# Patient Record
Sex: Male | Born: 1940 | Race: Black or African American | Hispanic: No | Marital: Married | Smoking: Former smoker
Health system: Southern US, Community
[De-identification: ages and names within clinical notes are randomized; demographics above are authoritative.]

## PROBLEM LIST (undated history)

## (undated) DIAGNOSIS — T7840XA Allergy, unspecified, initial encounter: Secondary | ICD-10-CM

## (undated) DIAGNOSIS — J189 Pneumonia, unspecified organism: Secondary | ICD-10-CM

## (undated) DIAGNOSIS — K219 Gastro-esophageal reflux disease without esophagitis: Secondary | ICD-10-CM

## (undated) DIAGNOSIS — M1990 Unspecified osteoarthritis, unspecified site: Secondary | ICD-10-CM

## (undated) DIAGNOSIS — K602 Anal fissure, unspecified: Secondary | ICD-10-CM

## (undated) DIAGNOSIS — G4733 Obstructive sleep apnea (adult) (pediatric): Secondary | ICD-10-CM

## (undated) DIAGNOSIS — I10 Essential (primary) hypertension: Secondary | ICD-10-CM

## (undated) DIAGNOSIS — H9190 Unspecified hearing loss, unspecified ear: Secondary | ICD-10-CM

## (undated) DIAGNOSIS — N529 Male erectile dysfunction, unspecified: Secondary | ICD-10-CM

## (undated) DIAGNOSIS — G4730 Sleep apnea, unspecified: Secondary | ICD-10-CM

## (undated) HISTORY — PX: OTHER SURGICAL HISTORY: SHX169

## (undated) HISTORY — DX: Unspecified hearing loss, unspecified ear: H91.90

## (undated) HISTORY — DX: Male erectile dysfunction, unspecified: N52.9

## (undated) HISTORY — DX: Sleep apnea, unspecified: G47.30

## (undated) HISTORY — DX: Allergy, unspecified, initial encounter: T78.40XA

## (undated) HISTORY — DX: Essential (primary) hypertension: I10

## (undated) HISTORY — DX: Unspecified osteoarthritis, unspecified site: M19.90

## (undated) HISTORY — DX: Obstructive sleep apnea (adult) (pediatric): G47.33

## (undated) HISTORY — PX: ANAL FISSURE REPAIR: SHX2312

## (undated) HISTORY — DX: Gastro-esophageal reflux disease without esophagitis: K21.9

## (undated) HISTORY — DX: Anal fissure, unspecified: K60.2

## (undated) HISTORY — PX: KNEE ARTHROSCOPY: SHX127

---

## 1998-05-05 ENCOUNTER — Encounter: Payer: Self-pay | Admitting: Emergency Medicine

## 1998-05-05 ENCOUNTER — Emergency Department (HOSPITAL_COMMUNITY): Admission: EM | Admit: 1998-05-05 | Discharge: 1998-05-05 | Payer: Self-pay | Admitting: Emergency Medicine

## 2000-09-04 ENCOUNTER — Ambulatory Visit (HOSPITAL_COMMUNITY): Admission: RE | Admit: 2000-09-04 | Discharge: 2000-09-04 | Payer: Self-pay | Admitting: Orthopedic Surgery

## 2000-09-04 ENCOUNTER — Encounter: Payer: Self-pay | Admitting: Orthopedic Surgery

## 2001-05-14 ENCOUNTER — Encounter: Admission: RE | Admit: 2001-05-14 | Discharge: 2001-05-14 | Payer: Self-pay | Admitting: Internal Medicine

## 2001-05-14 ENCOUNTER — Encounter: Payer: Self-pay | Admitting: Internal Medicine

## 2003-09-24 ENCOUNTER — Ambulatory Visit (HOSPITAL_BASED_OUTPATIENT_CLINIC_OR_DEPARTMENT_OTHER): Admission: RE | Admit: 2003-09-24 | Discharge: 2003-09-24 | Payer: Self-pay | Admitting: Pulmonary Disease

## 2003-12-31 ENCOUNTER — Ambulatory Visit (HOSPITAL_BASED_OUTPATIENT_CLINIC_OR_DEPARTMENT_OTHER): Admission: RE | Admit: 2003-12-31 | Discharge: 2003-12-31 | Payer: Self-pay | Admitting: Pulmonary Disease

## 2003-12-31 ENCOUNTER — Ambulatory Visit: Payer: Self-pay | Admitting: Pulmonary Disease

## 2004-05-04 ENCOUNTER — Ambulatory Visit: Payer: Self-pay | Admitting: Family Medicine

## 2004-05-17 ENCOUNTER — Ambulatory Visit: Payer: Self-pay | Admitting: Family Medicine

## 2004-06-07 ENCOUNTER — Ambulatory Visit: Payer: Self-pay | Admitting: Gastroenterology

## 2004-06-16 ENCOUNTER — Ambulatory Visit: Payer: Self-pay | Admitting: Gastroenterology

## 2004-12-19 ENCOUNTER — Ambulatory Visit: Payer: Self-pay | Admitting: Family Medicine

## 2005-03-09 ENCOUNTER — Ambulatory Visit: Payer: Self-pay | Admitting: Family Medicine

## 2006-06-25 ENCOUNTER — Ambulatory Visit: Payer: Self-pay | Admitting: Family Medicine

## 2006-06-25 LAB — CONVERTED CEMR LAB
ALT: 17 units/L (ref 0–40)
AST: 22 units/L (ref 0–37)
Albumin: 3.4 g/dL — ABNORMAL LOW (ref 3.5–5.2)
Alkaline Phosphatase: 77 units/L (ref 39–117)
BUN: 12 mg/dL (ref 6–23)
Basophils Absolute: 0 10*3/uL (ref 0.0–0.1)
Basophils Relative: 0.1 % (ref 0.0–1.0)
Bilirubin, Direct: 0.2 mg/dL (ref 0.0–0.3)
CO2: 28 meq/L (ref 19–32)
Calcium: 9.2 mg/dL (ref 8.4–10.5)
Chloride: 109 meq/L (ref 96–112)
Cholesterol: 152 mg/dL (ref 0–200)
Creatinine, Ser: 0.9 mg/dL (ref 0.4–1.5)
Eosinophils Absolute: 0.2 10*3/uL (ref 0.0–0.6)
Eosinophils Relative: 3.2 % (ref 0.0–5.0)
GFR calc Af Amer: 109 mL/min
GFR calc non Af Amer: 90 mL/min
Glucose, Bld: 121 mg/dL — ABNORMAL HIGH (ref 70–99)
HCT: 40.8 % (ref 39.0–52.0)
HDL: 41.7 mg/dL (ref >39.0)
Hemoglobin: 13.8 g/dL (ref 13.0–17.0)
Hgb A1c MFr Bld: 6.5 % — ABNORMAL HIGH (ref 4.6–6.0)
LDL Cholesterol: 95 mg/dL (ref 0–99)
Lymphocytes Relative: 32.9 % (ref 12.0–46.0)
MCHC: 33.8 g/dL (ref 30.0–36.0)
MCV: 88.1 fL (ref 78.0–100.0)
Monocytes Absolute: 0.3 10*3/uL (ref 0.2–0.7)
Monocytes Relative: 5.4 % (ref 3.0–11.0)
Neutro Abs: 3.1 10*3/uL (ref 1.4–7.7)
Neutrophils Relative %: 58.4 % (ref 43.0–77.0)
PSA: 0.98 ng/mL (ref 0.10–4.00)
Platelets: 176 10*3/uL (ref 150–400)
Potassium: 3.5 meq/L (ref 3.5–5.1)
RBC: 4.63 M/uL (ref 4.22–5.81)
RDW: 13.2 % (ref 11.5–14.6)
Sodium: 143 meq/L (ref 135–145)
TSH: 1.95 microintl units/mL (ref 0.35–5.50)
Total Bilirubin: 0.9 mg/dL (ref 0.3–1.2)
Total CHOL/HDL Ratio: 3.6
Total Protein: 6.4 g/dL (ref 6.0–8.3)
Triglycerides: 78 mg/dL (ref 0–149)
VLDL: 16 mg/dL (ref 0–40)
WBC: 5.3 10*3/uL (ref 4.5–10.5)

## 2006-10-22 DIAGNOSIS — I10 Essential (primary) hypertension: Secondary | ICD-10-CM

## 2006-10-22 DIAGNOSIS — K219 Gastro-esophageal reflux disease without esophagitis: Secondary | ICD-10-CM | POA: Insufficient documentation

## 2006-10-22 HISTORY — DX: Essential (primary) hypertension: I10

## 2007-01-01 ENCOUNTER — Ambulatory Visit: Payer: Self-pay | Admitting: Family Medicine

## 2007-01-06 ENCOUNTER — Telehealth: Payer: Self-pay | Admitting: Family Medicine

## 2007-01-06 LAB — CONVERTED CEMR LAB: Hgb A1c MFr Bld: 6.4 % — ABNORMAL HIGH (ref 4.6–6.0)

## 2007-05-28 ENCOUNTER — Ambulatory Visit: Payer: Self-pay | Admitting: Family Medicine

## 2007-06-02 ENCOUNTER — Encounter: Payer: Self-pay | Admitting: Family Medicine

## 2007-06-05 ENCOUNTER — Encounter: Admission: RE | Admit: 2007-06-05 | Discharge: 2007-06-05 | Payer: Self-pay | Admitting: Family Medicine

## 2007-06-16 ENCOUNTER — Ambulatory Visit: Payer: Self-pay | Admitting: Family Medicine

## 2007-06-17 LAB — CONVERTED CEMR LAB
CO2: 30 meq/L (ref 19–32)
Calcium: 9.1 mg/dL (ref 8.4–10.5)
Chloride: 107 meq/L (ref 96–112)
Creatinine, Ser: 1.1 mg/dL (ref 0.4–1.5)
Glucose, Bld: 96 mg/dL (ref 70–99)
Sodium: 141 meq/L (ref 135–145)

## 2007-06-18 ENCOUNTER — Ambulatory Visit: Payer: Self-pay | Admitting: Cardiology

## 2007-06-18 ENCOUNTER — Encounter: Payer: Self-pay | Admitting: Family Medicine

## 2007-06-26 ENCOUNTER — Ambulatory Visit: Payer: Self-pay | Admitting: Family Medicine

## 2008-08-16 ENCOUNTER — Telehealth: Payer: Self-pay | Admitting: Family Medicine

## 2008-11-19 ENCOUNTER — Ambulatory Visit: Payer: Self-pay | Admitting: Family Medicine

## 2008-12-17 ENCOUNTER — Emergency Department (HOSPITAL_COMMUNITY): Admission: EM | Admit: 2008-12-17 | Discharge: 2008-12-17 | Payer: Self-pay | Admitting: Emergency Medicine

## 2009-02-11 ENCOUNTER — Ambulatory Visit: Payer: Self-pay | Admitting: Family Medicine

## 2009-11-21 ENCOUNTER — Ambulatory Visit: Payer: Self-pay | Admitting: Family Medicine

## 2009-11-21 ENCOUNTER — Encounter: Payer: Self-pay | Admitting: Family Medicine

## 2009-11-21 LAB — CONVERTED CEMR LAB
Bilirubin Urine: NEGATIVE
Ketones, urine, test strip: NEGATIVE
Nitrite: NEGATIVE
Protein, U semiquant: NEGATIVE
Urobilinogen, UA: 0.2

## 2009-11-23 LAB — CONVERTED CEMR LAB
ALT: 20 units/L (ref 0–53)
BUN: 13 mg/dL (ref 6–23)
Bilirubin, Direct: 0.2 mg/dL (ref 0.0–0.3)
Calcium: 9.3 mg/dL (ref 8.4–10.5)
Chloride: 103 meq/L (ref 96–112)
Cholesterol: 149 mg/dL (ref 0–200)
Creatinine, Ser: 0.9 mg/dL (ref 0.4–1.5)
Eosinophils Relative: 2.9 % (ref 0.0–5.0)
GFR calc non Af Amer: 113.27 mL/min (ref >60)
HDL: 39 mg/dL — ABNORMAL LOW (ref >39.00)
LDL Cholesterol: 86 mg/dL (ref 0–99)
Lymphocytes Relative: 30.1 % (ref 12.0–46.0)
MCV: 88.3 fL (ref 78.0–100.0)
Monocytes Absolute: 0.4 10*3/uL (ref 0.1–1.0)
Neutrophils Relative %: 61 % (ref 43.0–77.0)
PSA: 0.92 ng/mL (ref 0.10–4.00)
Platelets: 171 10*3/uL (ref 150.0–400.0)
Total Bilirubin: 1.1 mg/dL (ref 0.3–1.2)
Triglycerides: 118 mg/dL (ref 0.0–149.0)
VLDL: 23.6 mg/dL (ref 0.0–40.0)
WBC: 7.8 10*3/uL (ref 4.5–10.5)

## 2009-11-25 ENCOUNTER — Encounter (INDEPENDENT_AMBULATORY_CARE_PROVIDER_SITE_OTHER): Payer: Self-pay | Admitting: *Deleted

## 2009-12-05 ENCOUNTER — Encounter (INDEPENDENT_AMBULATORY_CARE_PROVIDER_SITE_OTHER): Payer: Self-pay | Admitting: *Deleted

## 2009-12-07 ENCOUNTER — Ambulatory Visit: Payer: Self-pay | Admitting: Gastroenterology

## 2009-12-26 ENCOUNTER — Encounter: Payer: Self-pay | Admitting: Gastroenterology

## 2010-01-17 ENCOUNTER — Ambulatory Visit: Payer: Self-pay | Admitting: Family Medicine

## 2010-01-17 DIAGNOSIS — M1990 Unspecified osteoarthritis, unspecified site: Secondary | ICD-10-CM

## 2010-01-24 ENCOUNTER — Encounter: Payer: Self-pay | Admitting: Family Medicine

## 2010-03-26 LAB — CONVERTED CEMR LAB
ALT: 21 units/L (ref 0–53)
ALT: 24 units/L (ref 0–53)
AST: 25 units/L (ref 0–37)
AST: 25 units/L (ref 0–37)
Alkaline Phosphatase: 76 units/L (ref 39–117)
Alkaline Phosphatase: 91 units/L (ref 39–117)
Basophils Absolute: 0 10*3/uL (ref 0.0–0.1)
Basophils Absolute: 0 10*3/uL (ref 0.0–0.1)
Bilirubin, Direct: 0.1 mg/dL (ref 0.0–0.3)
CO2: 32 meq/L (ref 19–32)
Calcium: 9.1 mg/dL (ref 8.4–10.5)
Chloride: 106 meq/L (ref 96–112)
Eosinophils Relative: 3.3 % (ref 0.0–5.0)
GFR calc non Af Amer: 107.8 mL/min (ref >60)
Glucose, Bld: 115 mg/dL — ABNORMAL HIGH (ref 70–99)
Glucose, Bld: 117 mg/dL — ABNORMAL HIGH (ref 70–99)
HCT: 40.7 % (ref 39.0–52.0)
HDL: 34.2 mg/dL — ABNORMAL LOW (ref >39.00)
Hemoglobin: 13.3 g/dL (ref 13.0–17.0)
Hemoglobin: 13.6 g/dL (ref 13.0–17.0)
LDL Cholesterol: 84 mg/dL (ref 0–99)
LDL Cholesterol: 86 mg/dL (ref 0–99)
Lymphocytes Relative: 25.2 % (ref 12.0–46.0)
Lymphocytes Relative: 30.1 % (ref 12.0–46.0)
MCHC: 32.8 g/dL (ref 30.0–36.0)
Monocytes Relative: 5.3 % (ref 3.0–12.0)
Monocytes Relative: 5.5 % (ref 3.0–12.0)
Neutro Abs: 4 10*3/uL (ref 1.4–7.7)
Neutro Abs: 4.3 10*3/uL (ref 1.4–7.7)
Neutrophils Relative %: 66.2 % (ref 43.0–77.0)
Platelets: 174 10*3/uL (ref 150.0–400.0)
Potassium: 3.6 meq/L (ref 3.5–5.1)
Potassium: 3.7 meq/L (ref 3.5–5.1)
RDW: 13 % (ref 11.5–14.6)
RDW: 13.1 % (ref 11.5–14.6)
Sodium: 141 meq/L (ref 135–145)
Sodium: 142 meq/L (ref 135–145)
TSH: 2.06 microintl units/mL (ref 0.35–5.50)
Total Bilirubin: 1 mg/dL (ref 0.3–1.2)
Total Bilirubin: 1.1 mg/dL (ref 0.3–1.2)
Total CHOL/HDL Ratio: 3.5
Total Protein: 6.7 g/dL (ref 6.0–8.3)
Triglycerides: 74 mg/dL (ref 0–149)
VLDL: 13.6 mg/dL (ref 0.0–40.0)
VLDL: 15 mg/dL (ref 0–40)
WBC: 6.4 10*3/uL (ref 4.5–10.5)

## 2010-03-30 NOTE — Assessment & Plan Note (Signed)
Summary: CPX (PT WILL COME IN FASTING) // RS   Vital Signs:  Patient profile:   70 year old male Height:      67 inches Weight:      219 pounds BMI:     34.42 O2 Sat:      96 % Temp:     97.7 degrees F Pulse rate:   60 / minute BP sitting:   130 / 86  (left arm) Cuff size:   large  Vitals Entered By: Pura Spice, RN (November 21, 2009 9:57 AM) CC: cpx,c/o arthritis    History of Present Illness: 70 yr old male for a cpx. He feels well in general.   Allergies (verified): No Known Drug Allergies  Past History:  Past Medical History: Reviewed history from 06/26/2007 and no changes required. Hypertension ED GERD Hematuria, hx of, had a normal workup per Dr. Annabell Howells 2006  Past Surgical History: Reviewed history from 06/26/2007 and no changes required. Colonoscopy-06/16/2004 per Dr. Jarold Motto, repeat in 5 years arthroscopy left knee  Family History: Reviewed history from 10/22/2006 and no changes required. Family History Diabetes 1st degree relative Family History Hypertension  Social History: Reviewed history from 10/22/2006 and no changes required. Married Never Smoked Alcohol use-no Drug use-no Regular exercise-no  Review of Systems  The patient denies anorexia, fever, weight loss, weight gain, vision loss, decreased hearing, hoarseness, chest pain, syncope, dyspnea on exertion, peripheral edema, prolonged cough, headaches, hemoptysis, abdominal pain, melena, hematochezia, severe indigestion/heartburn, hematuria, incontinence, genital sores, muscle weakness, suspicious skin lesions, transient blindness, difficulty walking, depression, unusual weight change, abnormal bleeding, enlarged lymph nodes, angioedema, breast masses, and testicular masses.         Flu Vaccine Consent Questions     Do you have a history of severe allergic reactions to this vaccine? no    Any prior history of allergic reactions to egg and/or gelatin? no    Do you have a sensitivity to the  preservative Thimersol? no    Do you have a past history of Guillan-Barre Syndrome? no    Do you currently have an acute febrile illness? no    Have you ever had a severe reaction to latex? no    Vaccine information given and explained to patient? yes    Are you currently pregnant? no    Lot Number:[id number]   Exp Date:08/26/2010   Site Given  Left Deltoid IM Pura Spice, RN  November 21, 2009 4:50 PM   Physical Exam  General:  Well-developed,well-nourished,in no acute distress; alert,appropriate and cooperative throughout examination Head:  Normocephalic and atraumatic without obvious abnormalities. No apparent alopecia or balding. Eyes:  No corneal or conjunctival inflammation noted. EOMI. Perrla. Funduscopic exam benign, without hemorrhages, exudates or papilledema. Vision grossly normal. Ears:  External ear exam shows no significant lesions or deformities.  Otoscopic examination reveals clear canals, tympanic membranes are intact bilaterally without bulging, retraction, inflammation or discharge. Hearing is grossly normal bilaterally. Nose:  External nasal examination shows no deformity or inflammation. Nasal mucosa are pink and moist without lesions or exudates. Mouth:  Oral mucosa and oropharynx without lesions or exudates.  Teeth in good repair. Neck:  No deformities, masses, or tenderness noted. Chest Wall:  No deformities, masses, tenderness or gynecomastia noted. Lungs:  Normal respiratory effort, chest expands symmetrically. Lungs are clear to auscultation, no crackles or wheezes. Heart:  Normal rate and regular rhythm. S1 and S2 normal without gallop, murmur, click, rub or other extra sounds. EKG normal Abdomen:  Bowel sounds positive,abdomen soft and non-tender without masses, organomegaly or hernias noted. Rectal:  No external abnormalities noted. Normal sphincter tone. No rectal masses or tenderness. Heme neg. Genitalia:  Testes bilaterally descended without nodularity,  tenderness or masses. No scrotal masses or lesions. No penis lesions or urethral discharge. Prostate:  Prostate gland firm and smooth, no enlargement, nodularity, tenderness, mass, asymmetry or induration. Msk:  No deformity or scoliosis noted of thoracic or lumbar spine.   Pulses:  R and L carotid,radial,femoral,dorsalis pedis and posterior tibial pulses are full and equal bilaterally Extremities:  No clubbing, cyanosis, edema, or deformity noted with normal full range of motion of all joints.   Neurologic:  No cranial nerve deficits noted. Station and gait are normal. Plantar reflexes are down-going bilaterally. DTRs are symmetrical throughout. Sensory, motor and coordinative functions appear intact. Skin:  Intact without suspicious lesions or rashes Cervical Nodes:  No lymphadenopathy noted Axillary Nodes:  No palpable lymphadenopathy Inguinal Nodes:  No significant adenopathy Psych:  Cognition and judgment appear intact. Alert and cooperative with normal attention span and concentration. No apparent delusions, illusions, hallucinations   Impression & Recommendations:  Problem # 1:  WELL ADULT EXAM (ICD-V70.0)  Orders: Hemoccult Guaiac-1 spec.(in office) (82270) UA Dipstick w/o Micro (automated)  (81003) EKG w/ Interpretation (93000) Venipuncture (09811) TLB-Lipid Panel (80061-LIPID) TLB-BMP (Basic Metabolic Panel-BMET) (80048-METABOL) TLB-CBC Platelet - w/Differential (85025-CBCD) TLB-Hepatic/Liver Function Pnl (80076-HEPATIC) TLB-TSH (Thyroid Stimulating Hormone) (84443-TSH) TLB-PSA (Prostate Specific Antigen) (84153-PSA) Specimen Handling (91478) Gastroenterology Referral (GI)  Complete Medication List: 1)  Atenolol 50 Mg Tabs (Atenolol) .Marland Kitchen.. 1 by mouth once daily 2)  Hyzaar 100-25 Mg Tabs (Losartan potassium-hctz) .Marland Kitchen.. 1 by mouth once daily 3)  Cialis 20 Mg Tabs (Tadalafil) .... Use as directed 4)  Omeprazole 40 Mg Cpdr (Omeprazole) .... Two times a day 5)  Tramadol Hcl 50  Mg Tabs (Tramadol hcl) .Marland Kitchen.. 1 q 6 hours as needed pain  Other Orders: Flu Vaccine 14yrs + MEDICARE PATIENTS (G9562) Administration Flu vaccine - MCR (Z3086)  Patient Instructions: 1)  Get fasting labs Prescriptions: CIALIS 20 MG  TABS (TADALAFIL) use as directed  #6 x 11   Entered and Authorized by:   Nelwyn Salisbury MD   Signed by:   Nelwyn Salisbury MD on 11/21/2009   Method used:   [facility]  [address] [phone]       [address]       [place], [place]  [address]       Ph: [phone] or [phone]       Fax: [phone]   RxID:   [id number] HYZAAR 100-25 MG TABS (LOSARTAN POTASSIUM-HCTZ) 1 by mouth once daily  #30 x 11   Entered and Authorized by:   Nelwyn Salisbury MD   Signed by:   Nelwyn Salisbury MD on 11/21/2009   Method used:   [facility]        [facility] [facility] [address] [phone]* (retail)       [address]       [name], [place]  [address]       Ph: [phone] or [phone]       Fax: [phone]   RxID:   [id number] ATENOLOL 50 MG TABS (ATENOLOL) 1 by mouth once daily  #30 x 11   Entered and Authorized by:  Nelwyn Salisbury MD   Signed by:   Nelwyn Salisbury MD on 11/21/2009   Method used:   [facility]        [facility] [address] [facility] [address] [phone]* (retail)       [address]       [place], [place]  [address]       Ph: [phone] or [phone]       Fax: [phone]   RxID:   [id number]     Laboratory Results   Urine Tests    Routine Urinalysis   Color: yellow Appearance: Clear Glucose: negative   (Normal Range: Negative) Bilirubin: negative   (Normal Range: Negative) Ketone: negative   (Normal Range: Negative) Spec. Gravity: 1.020   (Normal Range: 1.003-1.035) Blood: 1+   (Normal Range: Negative) pH: 5.5   (Normal Range: 5.0-8.0) Protein: negative   (Normal Range: Negative) Urobilinogen: 0.2   (Normal Range:  0-1) Nitrite: negative   (Normal Range: Negative) Leukocyte Esterace: negative   (Normal Range: Negative)    Comments: Rita Ohara  November 21, 2009 11:49 AM

## 2010-03-30 NOTE — Letter (Signed)
Summary: Pre Visit Letter Revised  [facility]  [address] [place], [place] [address]   Phone: [phone]  Fax: [phone]        11/25/2009 MRN: [id number] Eric Richmond [address] [place], [place]  [address]             Procedure Date:  12-21-09   Welcome to the Gastroenterology Division at [facility].    You are scheduled to see a nurse for your pre-procedure visit on 12-07-09 at 4:30p.m. on the 3rd floor at [facility], [address]  We ask that you try to arrive at our office 15 minutes prior to your appointment time to allow for check-in.  Please take a minute to review the attached form.  If you answer "Yes" to one or more of the questions on the first page, we ask that you call the person listed at your earliest opportunity.  If you answer "No" to all of the questions, please complete the rest of the form and bring it to your appointment.    Your nurse visit will consist of discussing your medical and surgical history, your immediate family medical history, and your medications.   If you are unable to list all of your medications on the form, please bring the medication bottles to your appointment and we will list them.  We will need to be aware of both prescribed and over the counter drugs.  We will need to know exact dosage information as well.    Please be prepared to read and sign documents such as consent forms, a financial agreement, and acknowledgement forms.  If necessary, and with your consent, a friend or relative is welcome to sit-in on the nurse visit with you.  Please bring your insurance card so that we may make a copy of it.  If your insurance requires a referral to see a specialist, please bring your referral form from your primary care physician.  No co-pay is required for this nurse visit.     If you cannot keep your appointment, please call [phone] to cancel or reschedule prior to your appointment date.  This  allows [place] the opportunity to schedule an appointment for another patient in need of care.    Thank you for choosing [facility] for your medical needs.  We appreciate the opportunity to care for you.  Please visit [place] at our website  to learn more about our practice.  Sincerely, The Gastroenterology Division

## 2010-03-30 NOTE — Assessment & Plan Note (Signed)
Summary: consult re: recurring pain in shouler/hurts to raise arm/cjr/...   Vital Signs:  Patient profile:   70 year old male Weight:      219 pounds O2 Sat:      97 % Temp:     97.7 degrees F Pulse rate:   61 / minute BP sitting:   140 / 80  (left arm) Cuff size:   large  Vitals Entered By: Pura Spice, RN (January 17, 2010 3:41 PM) CC: pain rt elbow shoulder cant raise and c/o rt knee pain   History of Present Illness: Here for pain in the right shoulder and right knee, which have been worse in the past few months. tramadol helps somewhat. He often has trouble raising his arm above his head.   Allergies: No Known Drug Allergies  Past History:  Past Medical History: Hypertension ED GERD Hematuria, hx of, had a normal workup per Dr. Annabell Howells 2006 Osteoarthritis  Review of Systems  The patient denies anorexia, fever, weight loss, weight gain, vision loss, decreased hearing, hoarseness, chest pain, syncope, dyspnea on exertion, peripheral edema, prolonged cough, headaches, hemoptysis, abdominal pain, melena, hematochezia, severe indigestion/heartburn, hematuria, incontinence, genital sores, muscle weakness, suspicious skin lesions, transient blindness, difficulty walking, depression, unusual weight change, abnormal bleeding, enlarged lymph nodes, angioedema, breast masses, and testicular masses.         Flu Vaccine Consent Questions     Do you have a history of severe allergic reactions to this vaccine? no    Any prior history of allergic reactions to egg and/or gelatin? no    Do you have a sensitivity to the preservative Thimersol? no    Do you have a past history of Guillan-Barre Syndrome? no    Do you currently have an acute febrile illness? no    Have you ever had a severe reaction to latex? no    Vaccine information given and explained to patient? yes    Are you currently pregnant? no    Lot Number:[id number]   Exp Date:08/26/2010   Site Given  Left Deltoid IM Pura Spice, RN  January 17, 2010 3:41 PM   Physical Exam  General:  walks with a slight limp Msk:  the right shoulder has a lot of crepitus, it is tender in ther anterior subacromial area. His entire ROM is very limited. The right knee is also full of crepitus, and it is tender. ROM is full   Impression & Recommendations:  Problem # 1:  OSTEOARTHRITIS (ICD-715.90)  His updated medication list for this problem includes:    Tramadol Hcl 50 Mg Tabs (Tramadol hcl) .Marland Kitchen... 1 q 6 hours as needed pain  Orders: Orthopedic Referral (Ortho)  Complete Medication List: 1)  Atenolol 50 Mg Tabs (Atenolol) .Marland Kitchen.. 1 by mouth once daily 2)  Hyzaar 100-25 Mg Tabs (Losartan potassium-hctz) .Marland Kitchen.. 1 by mouth once daily 3)  Cialis 20 Mg Tabs (Tadalafil) .... Use as directed 4)  Omeprazole 40 Mg Cpdr (Omeprazole) .... Two times a day 5)  Tramadol Hcl 50 Mg Tabs (Tramadol hcl) .Marland Kitchen.. 1 q 6 hours as needed pain  Other Orders: Admin 1st Vaccine (19147) Flu Vaccine 60yrs + (82956)  Patient Instructions: 1)  Use Tramadol as needed . he has never seen an Orthopedic doctor, so we will arrange this. He could benefit from steroid injections, among other things.    Orders Added: 1)  Admin 1st Vaccine [90471] 2)  Flu Vaccine 10yrs + [90658] 3)  Est. Patient Level  IV [id number] 4)  Orthopedic Referral [Ortho]

## 2010-03-30 NOTE — Letter (Signed)
Summary: [name] and No Reschedule  [facility]  [address] [place], [place] [address]   Phone: [phone]  Fax: [phone]      December 26, 2009 MRN: [id number]   Eric Richmond [address] [place], [place]  [address]     You recently cancelled your endoscopic procedure at the [facility] and did not reschedule for another date.    Your provider recommended this procedure for the benefit of your health.  It is very important that you reschedule it.  Failure to do so may be to the detriment of your health.  Please call us at [phone] and we will be happy to assist you with rescheduling.    If you were referred for this procedure by another physician/provider, we will notify him/her that you did not keep your appointment.   Sincerely, [facility]

## 2010-03-30 NOTE — Letter (Signed)
Summary: [facility] Instructions  [facility]  [address] [place], [place] [address]   Phone: [phone]  Fax: [phone]       Eric Richmond    [birth year]    MRN: [id number]        Procedure Day Dorna Bloom:  Wednesday 12/21/2009     Arrival Time: 7:30 am      Procedure Time: 8:30 am     Location of Procedure:                    _x _  [facility] (4th Floor)                        PREPARATION FOR COLONOSCOPY WITH MOVIPREP   Starting 5 days prior to your procedure Friday 10/21 do not eat nuts, seeds, popcorn, corn, beans, peas,  salads, or any raw vegetables.  Do not take any fiber supplements (e.g. Metamucil, Citrucel, and Benefiber).  THE DAY BEFORE YOUR PROCEDURE         DATE: Tuesday 10/25  1.  Drink clear liquids the entire day-NO SOLID FOOD  2.  Do not drink anything colored red or purple.  Avoid juices with pulp.  No orange juice.  3.  Drink at least 64 oz. (8 glasses) of fluid/clear liquids during the day to prevent dehydration and help the prep work efficiently.  CLEAR LIQUIDS INCLUDE: Water Jello Ice Popsicles Tea (sugar ok, no milk/cream) Powdered fruit flavored drinks Coffee (sugar ok, no milk/cream) Gatorade Juice: apple, white grape, white cranberry  Lemonade Clear bullion, consomm, broth Carbonated beverages (any kind) Strained chicken noodle soup Hard Candy                             4.  In the morning, mix first dose of MoviPrep solution:    Empty 1 Pouch A and 1 Pouch B into the disposable container    Add lukewarm drinking water to the top line of the container. Mix to dissolve    Refrigerate (mixed solution should be used within 24 hrs)  5.  Begin drinking the prep at 5:00 p.m. The MoviPrep container is divided by 4 marks.   Every 15 minutes drink the solution down to the next mark (approximately 8 oz) until the full liter is complete.   6.  Follow completed prep with 16 oz of clear liquid of your choice  (Nothing red or purple).  Continue to drink clear liquids until bedtime.  7.  Before going to bed, mix second dose of MoviPrep solution:    Empty 1 Pouch A and 1 Pouch B into the disposable container    Add lukewarm drinking water to the top line of the container. Mix to dissolve    Refrigerate  THE DAY OF YOUR PROCEDURE      DATE: Wednesday 10/26  Beginning at 3:30 a.m. (5 hours before procedure):         1. Every 15 minutes, drink the solution down to the next mark (approx 8 oz) until the full liter is complete.  2. Follow completed prep with 16 oz. of clear liquid of your choice.    3. You may drink clear liquids until 6:30 am (2 HOURS BEFORE PROCEDURE).   MEDICATION INSTRUCTIONS  Unless otherwise instructed, you should take regular prescription medications with a small sip of water   as early as possible the morning of  your procedure.  Take Atenolol the morning of procedure.  Additional medication instructions:  Hold Hyzaar/HCTZ the morning of procedure.         OTHER INSTRUCTIONS  You will need a responsible adult at least 70 years of age to accompany you and drive you home.   This person must remain in the waiting room during your procedure.  Wear loose fitting clothing that is easily removed.  Leave jewelry and other valuables at home.  However, you may wish to bring a book to read or  an iPod/MP3 player to listen to music as you wait for your procedure to start.  Remove all body piercing jewelry and leave at home.  Total time from sign-in until discharge is approximately 2-3 hours.  You should go home directly after your procedure and rest.  You can resume normal activities the  day after your procedure.  The day of your procedure you should not:   Drive   Make legal decisions   Operate machinery   Drink alcohol   Return to work  You will receive specific instructions about eating, activities and medications before you leave.    The above  instructions have been reviewed and explained to me by   Wyona Almas RN  December 07, 2009 4:48 PM     I fully understand and can verbalize these instructions _____________________________ Date _________

## 2010-03-30 NOTE — Miscellaneous (Signed)
Summary: [facility] Previsit/prep  Clinical Lists Changes  Medications: Added new medication of MOVIPREP 100 GM  SOLR (PEG-KCL-NACL-NASULF-NA ASC-C) As per prep instructions. - Signed Rx of MOVIPREP 100 GM  SOLR (PEG-KCL-NACL-NASULF-NA ASC-C) As per prep instructions.;  #1 x 0;  Signed;  Entered by: Wyona Almas RN;  Authorized by: Mardella Layman MD [facility];  Method used: [facility]  [address] [phone]*, [address], [place], [place], [place]  [id number], Ph: [phone] or [phone], Fax: [phone] Observations: Added new observation of NKA: T (12/07/2009 16:19)    Prescriptions: MOVIPREP 100 GM  SOLR (PEG-KCL-NACL-NASULF-NA ASC-C) As per prep instructions.  #1 x 0   Entered by:   Wyona Almas RN   Authorized by:   Mardella Layman MD [facility]   Signed by:   Wyona Almas RN on 12/07/2009   Method used:   [facility]ron[facility] to        [facility] [phone]* (retail)       [address]       [place], [place]  [address]       Ph: [phone] or [phone]       Fax: [phone]   RxID:   [id number]

## 2010-03-30 NOTE — Letter (Signed)
Summary: [facility]   Imported By: Maryln Gottron 01/31/2010 10:47:29  _____________________________________________________________________  External Attachment:    Type:   Image     Comment:   External Document

## 2010-05-31 ENCOUNTER — Other Ambulatory Visit: Payer: Self-pay | Admitting: Family Medicine

## 2010-07-14 NOTE — Procedures (Signed)
NAME:  DEMAREON, COLDWELL           ACCOUNT NO.:  [id number]   MEDICAL RECORD NO.:  [id number]          PATIENT TYPE:  OUT   LOCATION:  SLEEP CENTER                 FACILITY:  [facility]   PHYSICIAN:  Marcelyn Bruins, M.D. [facility] DATE OF BIRTH:  Dec 20, 1940   DATE OF ADMISSION:  09/24/2003  DATE OF DISCHARGE:  09/24/2003                              NOCTURNAL POLYSOMNOGRAM   REFERRING PHYSICIAN:  Marcelyn Bruins, M.D.   INDICATION FOR THE STUDY:  Hypersomnia with sleep apnea.   SLEEP ARCHITECTURE:  The patient had a total sleep time of 293 minutes with  significantly decreased REM and slow wave sleep.  Sleep onset was very short  at 1 minute, and REM latency was slightly prolonged at 110 minutes.   IMPRESSION:  1. Severe obstructive sleep apnea/hypopnea syndrome with moderate oxygen     desaturation to 73%.  Events were not positional, however they were     clearly worse during rapid eye movement.  2. Moderate to loud snoring noted throughout the study.  3. No clinically significant cardiac arrhythmias.                                   ______________________________                                Marcelyn Bruins, M.D. [facility]     KC/MEDQ  D:  09/29/2003 15:48:17  T:  09/30/2003 07:43:37  Job:  [id number]

## 2010-07-14 NOTE — Procedures (Signed)
NAMEJERRAD, Eric Richmond NO.:  [id number]   MEDICAL RECORD NO.:  [id number]          PATIENT TYPE:  OUT   LOCATION:  SLEEP CENTER                 FACILITY:  [facility]   PHYSICIAN:  Marcelyn Bruins, M.D. [facility] DATE OF BIRTH:  1940/02/28   DATE OF STUDY:  12/31/2003                              NOCTURNAL POLYSOMNOGRAM   INDICATIONS FOR STUDY:  Hypersomnia with sleep apnea. The patient has known  severe obstructive sleep apnea and returns for pressure optimization.   SLEEP ARCHITECTURE:  Total sleep time of 361 minutes with sleep efficiency  of 96%. The patient never achieved slow wave sleep, but had normal REM.  Sleep onset latency was very short as was REM onset.   IMPRESSION/RECOMMENDATIONS:  1.  Adequate control of previously documented obstructive sleep apnea with      CPAP of 7 cm. However, there were significant breakthrough events during      supine REM toward the end of the study and therefore I would recommend a      treatment pressure of 10 cm. The patient used his [employer] Pacale nasal      CPAP mask from home.  2.  No clinically significant cardiac arrhythmias.      KC/MEDQ  D:  01/19/2004 17:12:45  T:  01/19/2004 21:33:56  Job:  [id number]

## 2010-07-14 NOTE — Assessment & Plan Note (Signed)
[facility] OFFICE NOTE   Eric Richmond, Negro CADIN LUKA                    MRN:          [id number]  DATE:06/25/2006                            DOB:          1940-03-11    This is a 70 year old gentleman here for a complete physical  examination. He feels fine and has no complaint at all.  His blood  pressure has been remaining stable.  He continues to use Cialis from  time to time with good results.  As far as his history of microscopic  hematuria, he saw Dr. Annabell Howells for a negative evaluation in 2006.  He saw  him again last year and apparently everything was fine.   For further details of his past medical history, family history, social  history, habits, etc., refer to our last physical note dated May 17, 2004.   ALLERGIES:  None   CURRENT MEDICATIONS:  1. Hyzaar 100/25 once a day.  2. Atenolol 50 mg per day.  3. Prevacid 30 mg per day.  4. Cialis 20 mg as needed.  5. Aleve as needed for joint pains.   OBJECTIVE:  Height 5 foot 8 inches, weight 220.  Blood pressure 140/92.  Pulse 64.  GENERAL:  He remains overweight.  SKIN:  Clear.  EYES:  Clear, wears glasses.  EARS:  Clear.  PHARYNX:  Clear.  NECK:  Supple without lymphadenopathy or masses.  LUNGS:  Clear.  CARDIAC:  Rate and rhythm regular without gallops, murmurs, rubs.  Distal pulses are full. EKG is within normal limits.  ABDOMEN:  Soft, normal bowel sounds, nontender, no masses.  GENITALIA:  Normal male.  RECTAL:  No masses or tenderness.  Prostate is within normal limits.  Stool Hemoccult negative.  EXTREMITIES:  No clubbing, cyanosis or edema.  NEUROLOGIC EXAM:  Grossly intact.   ASSESSMENT AND PLAN:  1. Complete physical.  He is fasting so I will get the usual      laboratories today and I advised more exercise to lose weight.  2. Hypertension, probably stable (he has not taken his medications yet      today).  3. Erectile dysfunction -  stable.  4. Gastroesophageal reflux disease - stable.  5. History of hematuria. Will check a urinalysis today.     Tera Mater. Clent Ridges, MD  Electronically Signed    SAF/MedQ  DD: 06/25/2006  DT: 06/25/2006  Job #: [id number]

## 2010-07-17 ENCOUNTER — Encounter: Payer: Self-pay | Admitting: Pulmonary Disease

## 2010-07-21 ENCOUNTER — Encounter: Payer: Self-pay | Admitting: Pulmonary Disease

## 2010-07-21 ENCOUNTER — Ambulatory Visit: Payer: Self-pay | Admitting: Pulmonary Disease

## 2010-07-21 ENCOUNTER — Ambulatory Visit (INDEPENDENT_AMBULATORY_CARE_PROVIDER_SITE_OTHER): Payer: BC Managed Care – PPO | Admitting: Pulmonary Disease

## 2010-07-21 VITALS — BP 120/80 | HR 60 | Temp 97.8°F | Ht 68.0 in | Wt 216.0 lb

## 2010-07-21 DIAGNOSIS — G4733 Obstructive sleep apnea (adult) (pediatric): Secondary | ICD-10-CM

## 2010-07-21 NOTE — Assessment & Plan Note (Signed)
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  He has been wearing compliantly, but is now having breakthru events per wife and worsening EDS.  He has not gained excessive weight since his last sleep study, and has kept up with mask replacements.  His machine is over 55yrs old, and I suspect this is the issue.  Will arrange for him to get a new cpap machine, and will re-optimize his pressure on auto mode for 2 weeks.  I have also encouraged him to work aggressively on weight loss.

## 2010-07-21 NOTE — Progress Notes (Signed)
  Subjective:    Patient ID: Eric Richmond, male    DOB: [birth year], 70 y.o.   MRN: [id number]  HPI The pt is a 70y/o male who I have been asked to see for management of osa.  He was diagnosed in 2005 with severe osa, and has been on cpap at 10cm of water pressure.  He is using the same machine, and just recently got a new mask.  He is wearing this compliantly, but his wife has noted recurrence of his snoring and an abnormal breathing pattern during sleep.  He is not rested in the am's upon arising, and has definite sleepiness during the day with periods of inactivity.  His epworth score is 18 today, and he feels his weight is up only about 5 pounds over the last 2 yrs.    Sleep Questionnaire: What time do you typically go to bed?( Between what hours) 10 to 11pm How long does it take you to fall asleep? no time How many times during the night do you wake up? 3 What time do you get out of bed to start your day? 0430 Do you drive or operate heavy machinery in your occupation? No How much has your weight changed (up or down) over the past two years? (In pounds) Have you ever had a sleep study before? Yes If yes, location of study? [facility] If yes, date of study? greater than 5 years ago but less than 10 years ago Do you currently use CPAP? Yes If so, what pressure? 15 to 20 Do you wear oxygen at any time? No     Review of Systems  Constitutional: Negative for fever and unexpected weight change.  HENT: Positive for congestion and sneezing. Negative for ear pain, nosebleeds, sore throat, rhinorrhea, trouble swallowing, dental problem, postnasal drip and sinus pressure.   Eyes: Negative for redness and itching.  Respiratory: Positive for shortness of breath. Negative for cough, chest tightness and wheezing.   Cardiovascular: Negative for palpitations and leg swelling.  Gastrointestinal: Negative for nausea and vomiting.  Genitourinary: Negative for dysuria.  Musculoskeletal: Positive for joint swelling.    Skin: Negative for rash.  Neurological: Negative for headaches.  Hematological: Does not bruise/bleed easily.  Psychiatric/Behavioral: Negative for dysphoric mood. The patient is not nervous/anxious.        Objective:   Physical Exam Constitutional:  Well developed, no acute distress  HENT:  Nares narrowed bilat with mild erythema  Oropharynx without exudate, palate and uvula are elongated with excessive soft tissue posteriorly  Eyes:  Perrla, eomi, no scleral icterus  Neck:  No JVD, no TMG  Cardiovascular:  Normal rate, regular rhythm, no rubs or gallops.  No murmurs        Intact distal pulses  Pulmonary :  Normal breath sounds, no stridor or respiratory distress   No rales, rhonchi, or wheezing  Abdominal:  Soft, nondistended, bowel sounds present.  No tenderness noted.   Musculoskeletal:  Minimal lower extremity edema noted.  Lymph Nodes:  No cervical lymphadenopathy noted  Skin:  No cyanosis noted  Neurologic:  Alert, appropriate, moves all 4 extremities without obvious deficit.         Assessment & Plan:

## 2010-07-21 NOTE — Patient Instructions (Signed)
Will arrange for you to get a new cpap machine, and will set it on auto mode for 2 weeks to re-optimize your pressure.  Will call you with results. Work on weight loss If you are doing well, will see you back in one year.  If not doing better, please let us know

## 2010-07-25 ENCOUNTER — Encounter: Payer: Self-pay | Admitting: Pulmonary Disease

## 2010-07-29 ENCOUNTER — Encounter: Payer: Self-pay | Admitting: Pulmonary Disease

## 2010-11-05 ENCOUNTER — Other Ambulatory Visit: Payer: Self-pay | Admitting: Pulmonary Disease

## 2010-11-05 DIAGNOSIS — G4733 Obstructive sleep apnea (adult) (pediatric): Secondary | ICD-10-CM

## 2010-11-22 ENCOUNTER — Other Ambulatory Visit: Payer: Self-pay | Admitting: Pulmonary Disease

## 2010-11-22 ENCOUNTER — Telehealth: Payer: Self-pay | Admitting: Pulmonary Disease

## 2010-11-22 DIAGNOSIS — G4733 Obstructive sleep apnea (adult) (pediatric): Secondary | ICD-10-CM

## 2010-11-22 NOTE — Telephone Encounter (Signed)
Let them know will try 13cm.  Will send order to [facility], but you may have to discuss with [facility] so they get on it asap.

## 2010-11-22 NOTE — Telephone Encounter (Signed)
Order placed by [facility].  [name] aware aware KC recs to try 13 cm and order will be sent today.  She verbalized understanding of this and voiced no further questions/concerns.    Spoke with Bjorn Loser. [facility] Regarding this.  She will fax over order now so it will be there for pt's appt with [facility] at 6pm tonight.

## 2010-11-22 NOTE — Telephone Encounter (Signed)
Called, spoke with pt's wife. States pt's pressure is set at 15 but he thinks this is too high.  He is having difficultly sleeping at this setting.  He is requesting this to be decreased.  Wife states they are supposed to be [facility] today at 6pm and would like the order to be there for this by then. I explained to pt's wife KC is still seeing pt and we first have to see what KC's recs are regarding this before we can send order. She is aware order may not be there by 6pm but we will call her back regarding this today.  She is ok with this and verbalized understanding.  KC, pls advise. Thank!

## 2010-11-26 ENCOUNTER — Other Ambulatory Visit: Payer: Self-pay | Admitting: Family Medicine

## 2010-12-01 ENCOUNTER — Other Ambulatory Visit: Payer: Self-pay | Admitting: Family Medicine

## 2010-12-01 NOTE — Telephone Encounter (Signed)
done

## 2010-12-21 ENCOUNTER — Other Ambulatory Visit: Payer: Self-pay | Admitting: Family Medicine

## 2010-12-21 NOTE — Telephone Encounter (Signed)
Script sent e-scribe 

## 2010-12-26 ENCOUNTER — Other Ambulatory Visit (INDEPENDENT_AMBULATORY_CARE_PROVIDER_SITE_OTHER): Payer: BC Managed Care – PPO

## 2010-12-26 DIAGNOSIS — Z0000 Encounter for general adult medical examination without abnormal findings: Secondary | ICD-10-CM

## 2010-12-26 LAB — LIPID PANEL
Cholesterol: 152 mg/dL (ref 0–200)
HDL: 42 mg/dL (ref >39.00)
LDL Cholesterol: 96 mg/dL (ref 0–99)
Total CHOL/HDL Ratio: 4
Triglycerides: 68 mg/dL (ref 0.0–149.0)

## 2010-12-26 LAB — BASIC METABOLIC PANEL
BUN: 15 mg/dL (ref 6–23)
CO2: 29 mEq/L (ref 19–32)
Chloride: 103 mEq/L (ref 96–112)
Creatinine, Ser: 1.1 mg/dL (ref 0.4–1.5)
Glucose, Bld: 127 mg/dL — ABNORMAL HIGH (ref 70–99)

## 2010-12-26 LAB — POCT URINALYSIS DIPSTICK
Bilirubin, UA: NEGATIVE
Glucose, UA: NEGATIVE
Nitrite, UA: POSITIVE
Spec Grav, UA: 1.025
pH, UA: 6

## 2010-12-26 LAB — CBC WITH DIFFERENTIAL/PLATELET
Eosinophils Absolute: 0.2 10*3/uL (ref 0.0–0.7)
HCT: 41 % (ref 39.0–52.0)
Lymphs Abs: 1.2 10*3/uL (ref 0.7–4.0)
MCHC: 33.4 g/dL (ref 30.0–36.0)
MCV: 89.1 fl (ref 78.0–100.0)
Monocytes Absolute: 0.9 10*3/uL (ref 0.1–1.0)
Neutrophils Relative %: 73 % (ref 43.0–77.0)
Platelets: 187 10*3/uL (ref 150.0–400.0)
RDW: 14 % (ref 11.5–14.6)

## 2010-12-26 LAB — HEPATIC FUNCTION PANEL
Bilirubin, Direct: 0.1 mg/dL (ref 0.0–0.3)
Total Bilirubin: 1 mg/dL (ref 0.3–1.2)
Total Protein: 6.8 g/dL (ref 6.0–8.3)

## 2010-12-26 LAB — TSH: TSH: 2.27 u[IU]/mL (ref 0.35–5.50)

## 2010-12-29 MED ORDER — CIPROFLOXACIN HCL 500 MG PO TABS: 500.0000 mg | ORAL_TABLET | Freq: Two times a day (BID) | ORAL | Status: AC

## 2010-12-29 NOTE — Progress Notes (Signed)
Quick Note:  Left a message for pt to return call. ______ 

## 2010-12-29 NOTE — Progress Notes (Signed)
Quick Note:  rx sent in to pharmacy. ______

## 2010-12-29 NOTE — Progress Notes (Signed)
Addended by: Azucena Freed on: 12/29/2010 02:10 PM   Modules accepted: Orders

## 2011-01-01 ENCOUNTER — Telehealth: Payer: Self-pay | Admitting: Family Medicine

## 2011-01-01 NOTE — Telephone Encounter (Signed)
Left voice message for pt to return my call.

## 2011-01-01 NOTE — Telephone Encounter (Signed)
Message copied by Baldemar Friday on Mon Jan 01, 2011  4:32 PM ------      Message from: Gershon Crane A      Created: Thu Dec 28, 2010  3:36 PM       Normal except he has a lot of infection in his urine and his PSA jumped way up from a year ago. Both of these are probably due to a prostate infection. Call in Cipro 500 mg bid for 14 days.

## 2011-01-02 ENCOUNTER — Telehealth: Payer: Self-pay | Admitting: Family Medicine

## 2011-01-02 NOTE — Telephone Encounter (Signed)
Message copied by Baldemar Friday on Tue Jan 02, 2011  1:58 PM ------      Message from: Gershon Crane A      Created: Thu Dec 28, 2010  3:36 PM       Normal except he has a lot of infection in his urine and his PSA jumped way up from a year ago. Both of these are probably due to a prostate infection. Call in Cipro 500 mg bid for 14 days.

## 2011-01-02 NOTE — Telephone Encounter (Signed)
Left another voice message with results.

## 2011-01-03 ENCOUNTER — Encounter: Payer: Self-pay | Admitting: Family Medicine

## 2011-01-03 ENCOUNTER — Telehealth: Payer: Self-pay | Admitting: Family Medicine

## 2011-01-03 ENCOUNTER — Ambulatory Visit (INDEPENDENT_AMBULATORY_CARE_PROVIDER_SITE_OTHER): Payer: BC Managed Care – PPO | Admitting: Family Medicine

## 2011-01-03 VITALS — BP 122/80 | HR 62 | Temp 98.7°F | Ht 67.75 in | Wt 216.0 lb

## 2011-01-03 DIAGNOSIS — Z0000 Encounter for general adult medical examination without abnormal findings: Secondary | ICD-10-CM

## 2011-01-03 NOTE — Progress Notes (Signed)
  Subjective:    Patient ID: Eric Richmond, male    DOB: 1940/09/07, 70 y.o.   MRN: [id number]  HPI 70 yr old male for a cpx. He has been doing well except for 2 weeks he has had some slight urinary burning and some urgency to urinate. No pain or nausea or fever. He admits to getting little exercise and eating whatever he wants. He has had some elevated glucoses for the past few years. On his recent labs, his fasting glucose is now up to 127. He had bacteria in his urine, and his PSA has jumped from less than 1.0 to over 8.    Review of Systems  Constitutional: Negative.   HENT: Negative.   Eyes: Negative.   Respiratory: Negative.   Cardiovascular: Negative.   Gastrointestinal: Negative.   Genitourinary: Positive for dysuria, urgency and frequency. Negative for hematuria, flank pain, discharge, difficulty urinating, penile pain and testicular pain.  Musculoskeletal: Negative.   Skin: Negative.   Neurological: Negative.   Hematological: Negative.   Psychiatric/Behavioral: Negative.        Objective:   Physical Exam  Constitutional: He is oriented to person, place, and time. He appears well-developed and well-nourished. No distress.  HENT:  Head: Normocephalic and atraumatic.  Right Ear: External ear normal.  Left Ear: External ear normal.  Nose: Nose normal.  Mouth/Throat: Oropharynx is clear and moist. No oropharyngeal exudate.  Eyes: Conjunctivae and EOM are normal. Pupils are equal, round, and reactive to light. Right eye exhibits no discharge. Left eye exhibits no discharge. No scleral icterus.  Neck: Neck supple. No JVD present. No tracheal deviation present. No thyromegaly present.  Cardiovascular: Normal rate, regular rhythm, normal heart sounds and intact distal pulses.  Exam reveals no gallop and no friction rub.   No murmur heard.      EKG normal  Pulmonary/Chest: Effort normal and breath sounds normal. No respiratory distress. He has no wheezes. He has no rales. He  exhibits no tenderness.  Abdominal: Soft. Bowel sounds are normal. He exhibits no distension and no mass. There is no tenderness. There is no rebound and no guarding.  Genitourinary: Rectum normal, prostate normal and penis normal. Guaiac negative stool. No penile tenderness.  Musculoskeletal: Normal range of motion. He exhibits no edema and no tenderness.  Lymphadenopathy:    He has no cervical adenopathy.  Neurological: He is alert and oriented to person, place, and time. He has normal reflexes. No cranial nerve deficit. He exhibits normal muscle tone. Coordination normal.  Skin: Skin is warm and dry. No rash noted. He is not diaphoretic. No erythema. No pallor.  Psychiatric: He has a normal mood and affect. His behavior is normal. Judgment and thought content normal.          Assessment & Plan:  He has an acute prostatitis which we are treating with 14 days of Cipro. About 4 weeks from now he will return to reassess, check another UA and another PSA. He has new onset type 2 diabetes, and we spent some time discussing this. We will treat with diet and exercise alone. He admits to eating a lot of sweets, so he will cut back on this. Recheck in one month with an A1c.

## 2011-01-03 NOTE — Telephone Encounter (Signed)
Pt is coming in for a office visit today.

## 2011-01-03 NOTE — Telephone Encounter (Signed)
Pt called and said that the doctors name that he saw in [facility], was Dr Theron Arista.

## 2011-01-04 ENCOUNTER — Encounter: Payer: Self-pay | Admitting: Family Medicine

## 2011-01-04 NOTE — Telephone Encounter (Signed)
noted 

## 2011-01-31 ENCOUNTER — Ambulatory Visit (INDEPENDENT_AMBULATORY_CARE_PROVIDER_SITE_OTHER): Payer: BC Managed Care – PPO | Admitting: Family Medicine

## 2011-01-31 ENCOUNTER — Encounter: Payer: Self-pay | Admitting: Family Medicine

## 2011-01-31 VITALS — BP 114/80 | HR 64 | Temp 98.3°F | Wt 209.0 lb

## 2011-01-31 DIAGNOSIS — N529 Male erectile dysfunction, unspecified: Secondary | ICD-10-CM

## 2011-01-31 DIAGNOSIS — R972 Elevated prostate specific antigen [PSA]: Secondary | ICD-10-CM

## 2011-01-31 DIAGNOSIS — I10 Essential (primary) hypertension: Secondary | ICD-10-CM

## 2011-01-31 DIAGNOSIS — E119 Type 2 diabetes mellitus without complications: Secondary | ICD-10-CM

## 2011-01-31 LAB — POCT URINALYSIS DIPSTICK
Bilirubin, UA: NEGATIVE
Leukocytes, UA: NEGATIVE
pH, UA: 5.5

## 2011-02-01 LAB — PSA: PSA: 2.38 ng/mL (ref 0.10–4.00)

## 2011-02-01 LAB — HEMOGLOBIN A1C: Hgb A1c MFr Bld: 6.6 % — ABNORMAL HIGH (ref 4.6–6.5)

## 2011-02-01 NOTE — Progress Notes (Signed)
  Subjective:    Patient ID: Armandina Gemma, male    DOB: 12-02-1940, 70 y.o.   MRN: [id number]  HPI Here to follow up on diabetes, HTN, and ED. He feels well but is disappointed in his results with Cialis. His BP is stable. He does not check his glucoses very often .    Review of Systems  Constitutional: Negative.   Respiratory: Negative.   Cardiovascular: Negative.        Objective:   Physical Exam  Constitutional: He appears well-developed and well-nourished.  Neck: No thyromegaly present.  Cardiovascular: Normal rate, regular rhythm, normal heart sounds and intact distal pulses.   Pulmonary/Chest: Effort normal and breath sounds normal.  Lymphadenopathy:    He has no cervical adenopathy.          Assessment & Plan:  Get labs. Try samples of Viagra 100 mg.

## 2011-02-02 NOTE — Progress Notes (Signed)
Quick Note:  Left voice message ______ 

## 2011-04-10 ENCOUNTER — Other Ambulatory Visit: Payer: Self-pay | Admitting: Family Medicine

## 2011-07-24 ENCOUNTER — Other Ambulatory Visit: Payer: Self-pay | Admitting: Family Medicine

## 2011-11-21 ENCOUNTER — Other Ambulatory Visit: Payer: Self-pay | Admitting: Family Medicine

## 2011-11-27 ENCOUNTER — Other Ambulatory Visit: Payer: Self-pay | Admitting: Family Medicine

## 2012-01-16 ENCOUNTER — Telehealth: Payer: Self-pay | Admitting: Family Medicine

## 2012-01-16 NOTE — Telephone Encounter (Signed)
Pt is sch for cpx 01/28/2012 830am

## 2012-01-16 NOTE — Telephone Encounter (Addendum)
Pt is requesting cpx first week of dec 2013. Can I create 30 min slot? Pt last cpx 12-2010

## 2012-01-16 NOTE — Telephone Encounter (Signed)
Okay 

## 2012-01-28 ENCOUNTER — Ambulatory Visit (INDEPENDENT_AMBULATORY_CARE_PROVIDER_SITE_OTHER): Payer: BC Managed Care – PPO | Admitting: Family Medicine

## 2012-01-28 ENCOUNTER — Encounter: Payer: Self-pay | Admitting: Family Medicine

## 2012-01-28 VITALS — BP 150/100 | HR 66 | Temp 97.6°F | Ht 67.5 in | Wt 211.0 lb

## 2012-01-28 DIAGNOSIS — E119 Type 2 diabetes mellitus without complications: Secondary | ICD-10-CM

## 2012-01-28 DIAGNOSIS — Z0000 Encounter for general adult medical examination without abnormal findings: Secondary | ICD-10-CM

## 2012-01-28 LAB — BASIC METABOLIC PANEL
BUN: 19 mg/dL (ref 6–23)
CO2: 29 mEq/L (ref 19–32)
Chloride: 103 mEq/L (ref 96–112)
GFR: 115.66 mL/min (ref >60.00)
Glucose, Bld: 118 mg/dL — ABNORMAL HIGH (ref 70–99)
Potassium: 4.3 mEq/L (ref 3.5–5.1)
Sodium: 139 mEq/L (ref 135–145)

## 2012-01-28 LAB — CBC WITH DIFFERENTIAL/PLATELET
Basophils Relative: 0.4 % (ref 0.0–3.0)
Eosinophils Absolute: 0.3 10*3/uL (ref 0.0–0.7)
Eosinophils Relative: 4.2 % (ref 0.0–5.0)
HCT: 42.8 % (ref 39.0–52.0)
Lymphs Abs: 2.1 10*3/uL (ref 0.7–4.0)
MCHC: 32.9 g/dL (ref 30.0–36.0)
MCV: 88.4 fl (ref 78.0–100.0)
Monocytes Absolute: 0.4 10*3/uL (ref 0.1–1.0)
Neutrophils Relative %: 59.1 % (ref 43.0–77.0)
Platelets: 173 10*3/uL (ref 150.0–400.0)
WBC: 6.7 10*3/uL (ref 4.5–10.5)

## 2012-01-28 LAB — POCT URINALYSIS DIPSTICK
Nitrite, UA: NEGATIVE
Spec Grav, UA: 1.02
Urobilinogen, UA: 0.2
pH, UA: 7

## 2012-01-28 LAB — HEPATIC FUNCTION PANEL
ALT: 21 U/L (ref 0–53)
AST: 22 U/L (ref 0–37)
Albumin: 3.7 g/dL (ref 3.5–5.2)
Alkaline Phosphatase: 94 U/L (ref 39–117)
Bilirubin, Direct: 0.2 mg/dL (ref 0.0–0.3)
Total Protein: 6.8 g/dL (ref 6.0–8.3)

## 2012-01-28 LAB — LIPID PANEL
Cholesterol: 163 mg/dL (ref 0–200)
VLDL: 20.8 mg/dL (ref 0.0–40.0)

## 2012-01-28 LAB — HEMOGLOBIN A1C: Hgb A1c MFr Bld: 6.8 % — ABNORMAL HIGH (ref 4.6–6.5)

## 2012-01-28 MED ORDER — OMEPRAZOLE 40 MG PO CPDR: 40.0000 mg | DELAYED_RELEASE_CAPSULE | Freq: Two times a day (BID) | ORAL | Status: DC

## 2012-01-28 MED ORDER — TADALAFIL 20 MG PO TABS: 20.0000 mg | ORAL_TABLET | Freq: Every day | ORAL | Status: DC | PRN

## 2012-01-28 MED ORDER — ATENOLOL 50 MG PO TABS: 50.0000 mg | ORAL_TABLET | Freq: Every day | ORAL | Status: DC

## 2012-01-28 MED ORDER — LOSARTAN POTASSIUM-HCTZ 100-25 MG PO TABS: 1.0000 | ORAL_TABLET | Freq: Every day | ORAL | Status: DC

## 2012-01-28 NOTE — Progress Notes (Signed)
  Subjective:    Patient ID: Eric Richmond, male    DOB: 1940/08/24, 71 y.o.   MRN: [id number]  HPI 71 yr old male for a cpx. He feels fine and has no concerns. His BP is up today but he has not taken his BP meds yet today. His wife checks this at home about once a month and she always says it is "okay". He does not know what the readings have been. He admits to eating salty foods over the past weekend and chewing some tobacco. He is past due for a foloow up colonoscopy, but he does not want to go back to the GI in [facility] anymore.    Review of Systems  Constitutional: Negative.   HENT: Negative.   Eyes: Negative.   Respiratory: Negative.   Cardiovascular: Negative.   Gastrointestinal: Negative.   Genitourinary: Negative.   Musculoskeletal: Negative.   Skin: Negative.   Neurological: Negative.   Hematological: Negative.   Psychiatric/Behavioral: Negative.        Objective:   Physical Exam  Constitutional: He is oriented to person, place, and time. He appears well-developed and well-nourished. No distress.  HENT:  Head: Normocephalic and atraumatic.  Right Ear: External ear normal.  Left Ear: External ear normal.  Nose: Nose normal.  Mouth/Throat: Oropharynx is clear and moist. No oropharyngeal exudate.  Eyes: Conjunctivae normal and EOM are normal. Pupils are equal, round, and reactive to light. Right eye exhibits no discharge. Left eye exhibits no discharge. No scleral icterus.  Neck: Neck supple. No JVD present. No tracheal deviation present. No thyromegaly present.  Cardiovascular: Normal rate, regular rhythm, normal heart sounds and intact distal pulses.  Exam reveals no gallop and no friction rub.   No murmur heard.      EKG normal   Pulmonary/Chest: Effort normal and breath sounds normal. No respiratory distress. He has no wheezes. He has no rales. He exhibits no tenderness.  Abdominal: Soft. Bowel sounds are normal. He exhibits no distension and no mass. There is no  tenderness. There is no rebound and no guarding.  Genitourinary: Rectum normal, prostate normal and penis normal. Guaiac negative stool. No penile tenderness.  Musculoskeletal: Normal range of motion. He exhibits no edema and no tenderness.  Lymphadenopathy:    He has no cervical adenopathy.  Neurological: He is alert and oriented to person, place, and time. He has normal reflexes. No cranial nerve deficit. He exhibits normal muscle tone. Coordination normal.  Skin: Skin is warm and dry. No rash noted. He is not diaphoretic. No erythema. No pallor.  Psychiatric: He has a normal mood and affect. His behavior is normal. Judgment and thought content normal.          Assessment & Plan:  Well exam. Get fasting labs. I urged him to quit using chewing tobacco. He will have his wife check his BP several times a week for a few weeks and call us with the readings. Refer to [facility].

## 2012-02-01 ENCOUNTER — Encounter: Payer: Self-pay | Admitting: Internal Medicine

## 2012-02-01 NOTE — Progress Notes (Signed)
Quick Note:  I left voice message with results. ______ 

## 2012-02-23 ENCOUNTER — Other Ambulatory Visit: Payer: Self-pay | Admitting: Family Medicine

## 2012-02-28 ENCOUNTER — Encounter: Payer: BC Managed Care – PPO | Admitting: Internal Medicine

## 2013-02-09 ENCOUNTER — Other Ambulatory Visit: Payer: Self-pay | Admitting: Family Medicine

## 2013-02-11 ENCOUNTER — Telehealth: Payer: Self-pay | Admitting: Family Medicine

## 2013-02-11 NOTE — Telephone Encounter (Signed)
Pt has been sch

## 2013-02-11 NOTE — Telephone Encounter (Signed)
lmom for pt to call back

## 2013-02-11 NOTE — Telephone Encounter (Signed)
This patient needs an appt to see Dr. Clent Ridges.  It will be his yearly wellness exam.  Please schedule this appt with the pt.  I have filled his medication for 30 days. Thanks!

## 2013-02-26 DIAGNOSIS — J189 Pneumonia, unspecified organism: Secondary | ICD-10-CM

## 2013-02-26 HISTORY — DX: Pneumonia, unspecified organism: J18.9

## 2013-03-18 ENCOUNTER — Encounter: Payer: Self-pay | Admitting: Family Medicine

## 2013-03-18 ENCOUNTER — Ambulatory Visit (INDEPENDENT_AMBULATORY_CARE_PROVIDER_SITE_OTHER): Payer: Self-pay | Admitting: Family Medicine

## 2013-03-18 VITALS — BP 126/80 | HR 63 | Temp 97.7°F | Ht 67.0 in | Wt 204.0 lb

## 2013-03-18 DIAGNOSIS — M25569 Pain in unspecified knee: Secondary | ICD-10-CM

## 2013-03-18 DIAGNOSIS — E119 Type 2 diabetes mellitus without complications: Secondary | ICD-10-CM | POA: Insufficient documentation

## 2013-03-18 DIAGNOSIS — M25562 Pain in left knee: Secondary | ICD-10-CM

## 2013-03-18 DIAGNOSIS — Z0000 Encounter for general adult medical examination without abnormal findings: Secondary | ICD-10-CM

## 2013-03-18 HISTORY — DX: Type 2 diabetes mellitus without complications: E11.9

## 2013-03-18 LAB — CBC WITH DIFFERENTIAL/PLATELET
BASOS ABS: 0 10*3/uL (ref 0.0–0.1)
BASOS PCT: 0.4 % (ref 0.0–3.0)
EOS ABS: 0.2 10*3/uL (ref 0.0–0.7)
Eosinophils Relative: 3.2 % (ref 0.0–5.0)
HCT: 43.9 % (ref 39.0–52.0)
Hemoglobin: 14.7 g/dL (ref 13.0–17.0)
Lymphocytes Relative: 31.2 % (ref 12.0–46.0)
Lymphs Abs: 1.8 10*3/uL (ref 0.7–4.0)
MCHC: 33.4 g/dL (ref 30.0–36.0)
MCV: 87 fl (ref 78.0–100.0)
MONO ABS: 0.4 10*3/uL (ref 0.1–1.0)
Monocytes Relative: 7.4 % (ref 3.0–12.0)
NEUTROS ABS: 3.4 10*3/uL (ref 1.4–7.7)
NEUTROS PCT: 57.8 % (ref 43.0–77.0)
Platelets: 176 10*3/uL (ref 150.0–400.0)
RBC: 5.05 Mil/uL (ref 4.22–5.81)
RDW: 14 % (ref 11.5–14.6)
WBC: 5.8 10*3/uL (ref 4.5–10.5)

## 2013-03-18 LAB — POCT URINALYSIS DIPSTICK
BILIRUBIN UA: NEGATIVE
Glucose, UA: NEGATIVE
Ketones, UA: NEGATIVE
Leukocytes, UA: NEGATIVE
Nitrite, UA: NEGATIVE
PH UA: 6.5
Spec Grav, UA: 1.02
Urobilinogen, UA: 0.2

## 2013-03-18 LAB — LIPID PANEL
CHOL/HDL RATIO: 3
CHOLESTEROL: 140 mg/dL (ref 0–200)
HDL: 40.9 mg/dL (ref >39.00)
LDL CALC: 79 mg/dL (ref 0–99)
TRIGLYCERIDES: 102 mg/dL (ref 0.0–149.0)
VLDL: 20.4 mg/dL (ref 0.0–40.0)

## 2013-03-18 LAB — HEPATIC FUNCTION PANEL
ALK PHOS: 95 U/L (ref 39–117)
ALT: 19 U/L (ref 0–53)
AST: 23 U/L (ref 0–37)
Albumin: 3.7 g/dL (ref 3.5–5.2)
Bilirubin, Direct: 0.1 mg/dL (ref 0.0–0.3)
TOTAL PROTEIN: 7.1 g/dL (ref 6.0–8.3)
Total Bilirubin: 0.6 mg/dL (ref 0.3–1.2)

## 2013-03-18 LAB — BASIC METABOLIC PANEL
BUN: 14 mg/dL (ref 6–23)
CALCIUM: 9.1 mg/dL (ref 8.4–10.5)
CO2: 29 mEq/L (ref 19–32)
CREATININE: 0.9 mg/dL (ref 0.4–1.5)
Chloride: 103 mEq/L (ref 96–112)
GFR: 105.12 mL/min (ref >60.00)
Glucose, Bld: 103 mg/dL — ABNORMAL HIGH (ref 70–99)
Potassium: 3.9 mEq/L (ref 3.5–5.1)
Sodium: 139 mEq/L (ref 135–145)

## 2013-03-18 LAB — HEMOGLOBIN A1C: Hgb A1c MFr Bld: 7 % — ABNORMAL HIGH (ref 4.6–6.5)

## 2013-03-18 LAB — PSA: PSA: 1.63 ng/mL (ref 0.10–4.00)

## 2013-03-18 LAB — TSH: TSH: 2.52 u[IU]/mL (ref 0.35–5.50)

## 2013-03-18 MED ORDER — ATENOLOL 50 MG PO TABS: ORAL_TABLET | ORAL | Status: DC

## 2013-03-18 MED ORDER — OMEPRAZOLE 40 MG PO CPDR: DELAYED_RELEASE_CAPSULE | ORAL | Status: DC

## 2013-03-18 MED ORDER — LOSARTAN POTASSIUM-HCTZ 100-25 MG PO TABS: ORAL_TABLET | ORAL | Status: DC

## 2013-03-18 MED ORDER — TADALAFIL 20 MG PO TABS: 20.0000 mg | ORAL_TABLET | ORAL | Status: DC | PRN

## 2013-03-18 NOTE — Progress Notes (Signed)
Pre visit review using our clinic review tool, if applicable. No additional management support is needed unless otherwise documented below in the visit note. 

## 2013-03-19 NOTE — Progress Notes (Signed)
   Subjective:    Patient ID: Eric Richmond, male    DOB: June 04, 1940, 73 y.o.   MRN: [id number]  HPI 73 yr old male for a cpx. He is doing well in general but he has a few complaints. First he has had trouble hearing and asks my advice. Also he asks to see someone about his left knee, which has been painful for years but now makes it hard for him to walk.    Review of Systems  Constitutional: Negative.   HENT: Negative.   Eyes: Negative.   Respiratory: Negative.   Cardiovascular: Negative.   Gastrointestinal: Negative.   Genitourinary: Negative.   Musculoskeletal: Negative.   Skin: Negative.   Neurological: Negative.   Psychiatric/Behavioral: Negative.        Objective:   Physical Exam  Constitutional: He is oriented to person, place, and time. He appears well-developed and well-nourished. No distress.  HENT:  Head: Normocephalic and atraumatic.  Right Ear: External ear normal.  Left Ear: External ear normal.  Nose: Nose normal.  Mouth/Throat: Oropharynx is clear and moist. No oropharyngeal exudate.  Eyes: Conjunctivae and EOM are normal. Pupils are equal, round, and reactive to light. Right eye exhibits no discharge. Left eye exhibits no discharge. No scleral icterus.  Neck: Neck supple. No JVD present. No tracheal deviation present. No thyromegaly present.  Cardiovascular: Normal rate, regular rhythm, normal heart sounds and intact distal pulses.  Exam reveals no gallop and no friction rub.   No murmur heard. EKG normal   Pulmonary/Chest: Effort normal and breath sounds normal. No respiratory distress. He has no wheezes. He has no rales. He exhibits no tenderness.  Abdominal: Soft. Bowel sounds are normal. He exhibits no distension and no mass. There is no tenderness. There is no rebound and no guarding.  Genitourinary: Rectum normal, prostate normal and penis normal. Guaiac negative stool. No penile tenderness.  Musculoskeletal: Normal range of motion. He exhibits no  edema and no tenderness.  Lymphadenopathy:    He has no cervical adenopathy.  Neurological: He is alert and oriented to person, place, and time. He has normal reflexes. No cranial nerve deficit. He exhibits normal muscle tone. Coordination normal.  Skin: Skin is warm and dry. No rash noted. He is not diaphoretic. No erythema. No pallor.  Psychiatric: He has a normal mood and affect. His behavior is normal. Judgment and thought content normal.          Assessment & Plan:  Well exam. Refer to Orthopedics for the knee. Set up a colonoscopy. Refer to Audiology.

## 2013-03-20 ENCOUNTER — Telehealth: Payer: Self-pay

## 2013-03-20 NOTE — Telephone Encounter (Signed)
Relevant patient education mailed to patient.  

## 2013-04-04 ENCOUNTER — Other Ambulatory Visit: Payer: Self-pay | Admitting: Family Medicine

## 2013-04-21 ENCOUNTER — Encounter: Payer: Self-pay | Admitting: Internal Medicine

## 2013-05-15 ENCOUNTER — Other Ambulatory Visit: Payer: Self-pay | Admitting: Family Medicine

## 2013-05-28 ENCOUNTER — Ambulatory Visit (AMBULATORY_SURGERY_CENTER): Payer: Self-pay | Admitting: *Deleted

## 2013-05-28 VITALS — Ht 68.0 in | Wt 212.0 lb

## 2013-05-28 DIAGNOSIS — Z86010 Personal history of colonic polyps: Secondary | ICD-10-CM

## 2013-05-28 MED ORDER — MOVIPREP 100 G PO SOLR: 1.0000 | Freq: Once | ORAL | Status: DC

## 2013-05-28 NOTE — Progress Notes (Signed)
Denies allergies to eggs or soy products. Denies difficulties with sedation or anesthesia.

## 2013-06-01 ENCOUNTER — Encounter: Payer: Self-pay | Admitting: Internal Medicine

## 2013-06-04 ENCOUNTER — Other Ambulatory Visit: Payer: Self-pay | Admitting: Family Medicine

## 2013-06-11 ENCOUNTER — Encounter: Payer: Self-pay | Admitting: Internal Medicine

## 2013-06-11 ENCOUNTER — Ambulatory Visit (AMBULATORY_SURGERY_CENTER): Payer: Medicare Other | Admitting: Internal Medicine

## 2013-06-11 VITALS — BP 147/103 | HR 48 | Temp 96.2°F | Resp 14 | Ht 68.0 in | Wt 212.0 lb

## 2013-06-11 DIAGNOSIS — Z1211 Encounter for screening for malignant neoplasm of colon: Secondary | ICD-10-CM

## 2013-06-11 DIAGNOSIS — D126 Benign neoplasm of colon, unspecified: Secondary | ICD-10-CM

## 2013-06-11 DIAGNOSIS — Z86010 Personal history of colonic polyps: Secondary | ICD-10-CM

## 2013-06-11 MED ORDER — SODIUM CHLORIDE 0.9 % IV SOLN: 500.0000 mL | INTRAVENOUS | Status: DC

## 2013-06-11 NOTE — Progress Notes (Signed)
No complaints noted in the recovery room. Maw   

## 2013-06-11 NOTE — Patient Instructions (Signed)
YOU HAD AN ENDOSCOPIC PROCEDURE TODAY AT THE [facility]: Refer to the procedure report that was given to you for any specific questions about what was found during the examination.  If the procedure report does not answer your questions, please call your gastroenterologist to clarify.  If you requested that your care partner not be given the details of your procedure findings, then the procedure report has been included in a sealed envelope for you to review at your convenience later.  YOU SHOULD EXPECT: Some feelings of bloating in the abdomen. Passage of more gas than usual.  Walking can help get rid of the air that was put into your GI tract during the procedure and reduce the bloating. If you had a lower endoscopy (such as a colonoscopy or flexible sigmoidoscopy) you may notice spotting of blood in your stool or on the toilet paper. If you underwent a bowel prep for your procedure, then you may not have a normal bowel movement for a few days.  DIET: Your first meal following the procedure should be a light meal and then it is ok to progress to your normal diet.  A half-sandwich or bowl of soup is an example of a good first meal.  Heavy or fried foods are harder to digest and may make you feel nauseous or bloated.  Likewise meals heavy in dairy and vegetables can cause extra gas to form and this can also increase the bloating.  Drink plenty of fluids but you should avoid alcoholic beverages for 24 hours.  ACTIVITY: Your care partner should take you home directly after the procedure.  You should plan to take it easy, moving slowly for the rest of the day.  You can resume normal activity the day after the procedure however you should NOT DRIVE or use heavy machinery for 24 hours (because of the sedation medicines used during the test).    SYMPTOMS TO REPORT IMMEDIATELY: A gastroenterologist can be reached at any hour.  During normal business hours, 8:30 AM to 5:00 PM Monday through Friday,  call [phone].  After hours and on weekends, please call the GI answering service at [phone] who will take a message and have the physician on call contact you.   Following lower endoscopy (colonoscopy or flexible sigmoidoscopy):  Excessive amounts of blood in the stool  Significant tenderness or worsening of abdominal pains  Swelling of the abdomen that is new, acute  Fever of 100F or higher   FOLLOW UP: If any biopsies were taken you will be contacted by phone or by letter within the next 1-3 weeks.  Call your gastroenterologist if you have not heard about the biopsies in 3 weeks.  Our staff will call the home number listed on your records the next business day following your procedure to check on you and address any questions or concerns that you may have at that time regarding the information given to you following your procedure. This is a courtesy call and so if there is no answer at the home number and we have not heard from you through the emergency physician on call, we will assume that you have returned to your regular daily activities without incident.  SIGNATURES/CONFIDENTIALITY: You and/or your care partner have signed paperwork which will be entered into your electronic medical record.  These signatures attest to the fact that that the information above on your After Visit Summary has been reviewed and is understood.  Full responsibility of the confidentiality of   this discharge information lies with you and/or your care-partner.    Handouts were given to your wife on polyps, diverticulosis and a high fiber diet with liberal fluid intake. You may resume your current medications today. Please call if any questions or concerns.  Await pathology results.

## 2013-06-11 NOTE — Progress Notes (Signed)
Called to room to assist during endoscopic procedure.  Patient ID and intended procedure confirmed with present staff. Received instructions for my participation in the procedure from the performing physician.  

## 2013-06-11 NOTE — Progress Notes (Signed)
Report to pacu rn, vss, bbs=clear 

## 2013-06-11 NOTE — Op Note (Signed)
Eric Richmond. Eric Richmond, Eric Richmond   COLONOSCOPY PROCEDURE REPORT  PATIENT: Eric Richmond, Eric Richmond  MR#: [id number] BIRTHDATE: [birth year] , 72  yrs. old GENDER: Male ENDOSCOPIST: Jerene Bears, MD REFERRED WF:[id number] Sarajane Jews, M.D. PROCEDURE DATE:  06/11/2013 PROCEDURE:   Colonoscopy with snare polypectomy and Colonoscopy with cold biopsy polypectomy First Screening Colonoscopy - Avg.  risk and is 50 yrs.  old or older - No.  Prior Negative Screening - Now for repeat screening. N/A  History of Adenoma - Now for follow-up colonoscopy & has been > or = to 3 yrs.  N/A  Polyps Removed Today? Yes. ASA CLASS:   Class III INDICATIONS:elevated risk screening and Patient's personal history of adenomatous colon polyps. MEDICATIONS: MAC sedation, administered by CRNA and propofol (Diprivan) 300mg  IV  DESCRIPTION OF PROCEDURE:   After the risks benefits and alternatives of the procedure were thoroughly explained, informed consent was obtained.  A digital rectal exam revealed no rectal mass.   The LB [id number]  endoscope was introduced through the anus and advanced to the cecum, which was identified by both the appendix and ileocecal valve. No adverse events experienced. The quality of the prep was Moviprep fair  The instrument was then slowly withdrawn as the colon was fully examined.   COLON FINDINGS: Five sessile polyps measuring 3-6 mm in size were found in the ascending colon and transverse colon.  Polypectomy was performed with cold forceps (1) and using cold snare (4).  All resections were complete and all polyp tissue was completely retrieved.   Three sessile polyps ranging between 3-21mm in size were found in the descending colon.  Polypectomy was performed using cold snare.  All resections were complete and all polyp tissue was completely retrieved.   There was severe diverticulosis noted throughout the entire examined colon with associated  muscular hypertrophy.  Retroflexed views revealed no abnormalities. The time to cecum=5 minutes 20 seconds.  Withdrawal time=15 minutes 12 seconds.  The scope was withdrawn and the procedure completed. COMPLICATIONS: There were no complications.  ENDOSCOPIC IMPRESSION: 1.   Five sessile polyps measuring 3-6 mm in size were found in the ascending colon and transverse colon; Polypectomy was performed with cold forceps and using cold snare 2.   Three sessile polyps ranging between 3-45mm in size were found in the descending colon; Polypectomy was performed using cold snare  3.   There was severe diverticulosis noted throughout the entire examined colon  RECOMMENDATIONS: 1.  Await pathology results 2.  High fiber diet 3.  Timing of repeat colonoscopy will be determined by pathology findings. 4.  You will receive a letter within 1-2 weeks with the results of your biopsy as well as final recommendations.  Please call my office if you have not received a letter after 3 weeks.   eSigned:  Jerene Bears, MD 06/11/2013 9:29 AM   cc: The Patient and [name], MD   PATIENT NAME:  [name], [name] MR#: [id number]

## 2013-06-12 ENCOUNTER — Telehealth: Payer: Self-pay | Admitting: *Deleted

## 2013-06-12 NOTE — Telephone Encounter (Signed)
  Follow up Call-  Call back number 06/11/2013  Post procedure Call Back phone  # [phone]  Permission to leave phone message Yes   spoke with wife; pt at work  Patient questions:  Do you have a fever, pain , or abdominal swelling? no Pain Score  0 *  Have you tolerated food without any problems? yes  Have you been able to return to your normal activities? yes  Do you have any questions about your discharge instructions: Diet   no Medications  no Follow up visit  no  Do you have questions or concerns about your Care? no  Actions: * If pain score is 4 or above: No action needed, pain <4.

## 2013-06-15 ENCOUNTER — Ambulatory Visit: Payer: Medicare Other

## 2013-06-15 ENCOUNTER — Ambulatory Visit (INDEPENDENT_AMBULATORY_CARE_PROVIDER_SITE_OTHER): Payer: Medicare Other | Admitting: Internal Medicine

## 2013-06-15 VITALS — BP 134/74 | HR 57 | Temp 98.4°F | Resp 18 | Ht 66.0 in | Wt 213.0 lb

## 2013-06-15 DIAGNOSIS — R062 Wheezing: Secondary | ICD-10-CM

## 2013-06-15 DIAGNOSIS — R059 Cough, unspecified: Secondary | ICD-10-CM

## 2013-06-15 DIAGNOSIS — J45909 Unspecified asthma, uncomplicated: Secondary | ICD-10-CM

## 2013-06-15 DIAGNOSIS — R05 Cough: Secondary | ICD-10-CM

## 2013-06-15 LAB — POCT CBC
Granulocyte percent: 63.1 %G (ref 37–80)
HCT, POC: 36.7 % — AB (ref 43.5–53.7)
Hemoglobin: 11.8 g/dL — AB (ref 14.1–18.1)
LYMPH, POC: 1.8 (ref 0.6–3.4)
MCH: 28.4 pg (ref 27–31.2)
MCHC: 32.2 g/dL (ref 31.8–35.4)
MCV: 88.5 fL (ref 80–97)
MID (CBC): 0.4 (ref 0–0.9)
MPV: 9.1 fL (ref 0–99.8)
PLATELET COUNT, POC: 164 10*3/uL (ref 142–424)
POC GRANULOCYTE: 3.8 (ref 2–6.9)
POC LYMPH %: 30 % (ref 10–50)
POC MID %: 6.9 %M (ref 0–12)
RBC: 4.15 M/uL — AB (ref 4.69–6.13)
RDW, POC: 15.5 %
WBC: 6.1 10*3/uL (ref 4.6–10.2)

## 2013-06-15 MED ORDER — AZITHROMYCIN 250 MG PO TABS: ORAL_TABLET | ORAL | Status: DC

## 2013-06-15 MED ORDER — ALBUTEROL SULFATE HFA 108 (90 BASE) MCG/ACT IN AERS: 2.0000 | INHALATION_SPRAY | Freq: Four times a day (QID) | RESPIRATORY_TRACT | Status: DC | PRN

## 2013-06-15 MED ORDER — IPRATROPIUM BROMIDE 0.02 % IN SOLN
0.5000 mg | Freq: Once | RESPIRATORY_TRACT | Status: AC
  Administered 2013-06-15: 0.5 mg via RESPIRATORY_TRACT

## 2013-06-15 MED ORDER — ALBUTEROL SULFATE (2.5 MG/3ML) 0.083% IN NEBU
2.5000 mg | INHALATION_SOLUTION | Freq: Once | RESPIRATORY_TRACT | Status: AC
  Administered 2013-06-15: 2.5 mg via RESPIRATORY_TRACT

## 2013-06-15 MED ORDER — HYDROCODONE-ACETAMINOPHEN 7.5-325 MG/15ML PO SOLN: 5.0000 mL | Freq: Four times a day (QID) | ORAL | Status: DC | PRN

## 2013-06-15 NOTE — Progress Notes (Signed)
   Subjective:    Patient ID: Eric Richmond, male    DOB: 05-Sep-1940, 73 y.o.   MRN: [id number]  HPI pt here c/o of cough( dark black), wheezing and congestion started Saturday. Denies fever, sore throat, or otalgia. otc alka seltzer   Plus with no relief. Patient of [name]. No exposures to sick people at home. Pt works at [employer] in [place]. Dr. Sarajane Richmond his primary care. Quit smoking 30 yrs ago, no hx asthma or allergy, no hx of any lung disease. Not sob,no cp.     Review of Systems     Objective:   Physical Exam  Constitutional: He is oriented to person, place, and time. He appears well-developed and well-nourished. He is active.  Non-toxic appearance. He does not have a sickly appearance. He does not appear ill. No distress.  HENT:  Head: Normocephalic.  Right Ear: External ear normal. Decreased hearing is noted.  Left Ear: External ear normal. Decreased hearing is noted.  Nose: Mucosal edema and rhinorrhea present.  Mouth/Throat: Uvula is midline. Uvula swelling present. Posterior oropharyngeal edema present.  Eyes: EOM are normal. Pupils are equal, round, and reactive to light.  Neck: Neck supple.  Cardiovascular: Normal rate, regular rhythm and normal heart sounds.   Pulmonary/Chest: Effort normal. No accessory muscle usage. Not tachypneic. No respiratory distress. He has no decreased breath sounds. He has wheezes. He has rhonchi. He has no rales. He exhibits no tenderness.  Abdominal: Soft. There is no tenderness.  Musculoskeletal: Normal range of motion.  Neurological: He is alert and oriented to person, place, and time. No cranial nerve deficit. He exhibits normal muscle tone. Coordination normal.  Psychiatric: He has a normal mood and affect. His behavior is normal. Judgment and thought content normal.   [facility] reading (PRIMARY) by  [name] no infiltrate seen  Results for orders placed in visit on 06/15/13  POCT CBC      Result Value Ref Range   WBC 6.1   4.6 - 10.2 K/uL   Lymph, poc 1.8  0.6 - 3.4   POC LYMPH PERCENT 30.0  10 - 50 %L   MID (cbc) 0.4  0 - 0.9   POC MID % 6.9  0 - 12 %M   POC Granulocyte 3.8  2 - 6.9   Granulocyte percent 63.1  37 - 80 %G   RBC 4.15 (*) 4.69 - 6.13 M/uL   Hemoglobin 11.8 (*) 14.1 - 18.1 g/dL   HCT, POC 36.7 (*) 43.5 - 53.7 %   MCV 88.5  80 - 97 fL   MCH, POC 28.4  27 - 31.2 pg   MCHC 32.2  31.8 - 35.4 g/dL   RDW, POC 15.5     Platelet Count, POC 164  142 - 424 K/uL   MPV 9.1  0 - 99.8 fL   Nebulizer tx Lungs cleared 50%       Assessment & Plan:  Asthmatic bronchitis Zpak/Lortab elixir/Albuterol inhaler RTC 3 days if not clearing  See Dr. Sarajane Richmond for anemia

## 2013-06-15 NOTE — Patient Instructions (Signed)
Chronic Asthmatic Bronchitis °Chronic asthmatic bronchitis is a complication of persistent asthma. After a period of time with asthma, some people develop airflow obstruction that is present all the time, even when not having an asthma attack. There is also persistent inflammation of the airways, and the bronchial tubes produce more mucus. Chronic asthmatic bronchitis usually is a permanent problem with the lungs. °CAUSES  °Chronic asthmatic bronchitis happens most often in people who have asthma and also smoke cigarettes. Occasionally, it can happen to a person with long-standing or severe asthma even if the person is not a smoker. °SIGNS AND SYMPTOMS  °Chronic asthmatic bronchitis usually causes symptoms of both asthma and chronic bronchitis, including:  °· Coughing. °· Increased sputum production. °· Wheezing and shortness of breath. °· Chest discomfort. °· Recurring infections. °DIAGNOSIS  °Your health care provider will take a medical history and perform a physical exam. Chronic asthmatic bronchitis is suspected when a person with asthma has abnormal results on breathing tests (pulmonary function tests) even when breathing symptoms are at their best. Other tests, such as a chest X-ray, may be performed to rule out other conditions.  °TREATMENT  °Treatment involves controlling symptoms with medicine and lifestyle changes. °· Your health care provider may prescribe asthma medicines, including inhaler and nebulizer medicines. °· Infection can be treated with medicine to kill germs (antibiotics). Serious infections may require hospitalization. These can include: °· Pneumonia. °· Sinus infections. °· Acute bronchitis.   °· Preventing infection and hospitalization is very important. Get an influenza vaccination every year as directed by your health care provider. Ask your health care provider whether you need a pneumonia vaccine. °· Ask your health care provider whether you would benefit from a pulmonary  rehabilitation program. °HOME CARE INSTRUCTIONS °· Only take over-the-counter or prescription medicine as directed by your health care provider. °· If you are a cigarette smoker, the most important thing that you can do is quit. Talk to your health care provider for help with quitting smoking. °· Avoid pollen, dust, animal dander, molds, smoke, and other things that cause attacks. °· Regular exercise is very important to help you feel better. Discuss possible exercise routines with your health care provider. °· If animal dander is the cause of asthma, you may not be able to keep pets. °· It is important that you: °· Become educated about your medical condition. °· Participate in maintaining wellness. °· Seek medical care as directed. Delay in seeking medical care could cause permanent injury and may be a risk to your life. °SEEK MEDICAL CARE IF: °· You have wheezing and shortness of breath even if taking medicine to prevent attacks. °· You have muscle aches, chest pain, or thickening of sputum. °· Your sputum changes from clear or white to yellow, green, gray, or bloody. °SEEK IMMEDIATE MEDICAL CARE IF: °· Your usual medicines do not stop your wheezing. °· You have increased coughing or shortness of breath or both. °· You have increased difficulty breathing. °· You have any problems from the medicine you are taking, such as a rash, itching, swelling, or trouble breathing. °MAKE SURE YOU:  °· Understand these instructions. °· Will watch your condition. °· Will get help right away if you are not doing well or get worse. °Document Released: 11/30/2005 Document Revised: 12/03/2012 Document Reviewed: 09/11/2012 °Exit[employer]® Patient Information ©2014 [employer]. ° °

## 2013-06-15 NOTE — Progress Notes (Signed)
   Subjective:    Patient ID: Eric Richmond, male    DOB: 26-Apr-1940, 73 y.o.   MRN: [id number]  HPI    Review of Systems     Objective:   Physical Exam        Assessment & Plan:

## 2013-06-16 ENCOUNTER — Telehealth: Payer: Self-pay

## 2013-06-16 ENCOUNTER — Encounter: Payer: Self-pay | Admitting: Internal Medicine

## 2013-06-16 NOTE — Telephone Encounter (Signed)
Spoke to patient's wife.  Concerned that RX for inhaler and abx had not been given to her husband.  States he got an RX for the cough medicine.  Looked in chart.  Advised her that other med had been sent electronically to the pharmacy.  She verbalized understanding.

## 2013-06-16 NOTE — Telephone Encounter (Signed)
Patient's wife called and states there is an issue with patient's prescription. Had a difficult time understanding her therefore I was unable to get the name of the medication and what the problem was.  Please call Sipriano Fendley [phone]

## 2013-06-23 ENCOUNTER — Ambulatory Visit (INDEPENDENT_AMBULATORY_CARE_PROVIDER_SITE_OTHER): Payer: Medicare Other | Admitting: Family Medicine

## 2013-06-23 ENCOUNTER — Encounter: Payer: Self-pay | Admitting: Family Medicine

## 2013-06-23 VITALS — BP 145/91 | HR 54 | Temp 98.0°F | Ht 66.0 in | Wt 210.0 lb

## 2013-06-23 DIAGNOSIS — D649 Anemia, unspecified: Secondary | ICD-10-CM

## 2013-06-23 DIAGNOSIS — J189 Pneumonia, unspecified organism: Secondary | ICD-10-CM

## 2013-06-23 DIAGNOSIS — J181 Lobar pneumonia, unspecified organism: Principal | ICD-10-CM

## 2013-06-23 MED ORDER — LEVOFLOXACIN 500 MG PO TABS: 500.0000 mg | ORAL_TABLET | Freq: Every day | ORAL | Status: AC

## 2013-06-23 NOTE — Progress Notes (Signed)
   Subjective:    Patient ID: Eric Richmond, male    DOB: 1940/09/11, 73 y.o.   MRN: [id number]  HPI Here to follow up a visit to Urgent Care on 06-15-13 for coughing up dark sputum and wheezing. This had been going on for 4 days prior to that visit. He had fever the first day and then this resolved. That day his exam showed only soft wheezes on lung exam, and a CXR was clear. He was given a Zpack which he finished 3 days ago. He does not feel much better however, and he is still coughing. His CBC that day showed a normal WBC count but his Hgb was a bit low at 11.8. Of note, he had a colonoscopy 3 days before that visit, where he had 8 polyps removed. No obvious blood seen in the stools.    Review of Systems  Constitutional: Negative.   HENT: Negative.   Eyes: Negative.   Respiratory: Positive for cough, chest tightness and wheezing. Negative for shortness of breath.   Cardiovascular: Negative.        Objective:   Physical Exam  Constitutional: He appears well-developed and well-nourished. No distress.  HENT:  Right Ear: External ear normal.  Left Ear: External ear normal.  Nose: Nose normal.  Mouth/Throat: Oropharynx is clear and moist.  Eyes: Conjunctivae are normal.  Pulmonary/Chest: Effort normal. No respiratory distress. He has rales.  Soft scattered wheezes and there are rales at the right posterior base  Lymphadenopathy:    He has no cervical adenopathy.          Assessment & Plan:  Partially treated RLL pneumonia. Start on 10 days of Levaquin 500 mg daily. Recheck in 10 days. I suspect he may have had some slight GI blood loss after the polypectomies. We will recheck a CBC in one month

## 2013-06-23 NOTE — Progress Notes (Signed)
Pre visit review using our clinic review tool, if applicable. No additional management support is needed unless otherwise documented below in the visit note. 

## 2013-07-07 ENCOUNTER — Encounter: Payer: Self-pay | Admitting: Family Medicine

## 2013-07-07 ENCOUNTER — Ambulatory Visit (INDEPENDENT_AMBULATORY_CARE_PROVIDER_SITE_OTHER): Payer: Medicare Other | Admitting: Family Medicine

## 2013-07-07 VITALS — BP 112/82 | HR 61 | Temp 97.7°F | Ht 66.0 in | Wt 212.0 lb

## 2013-07-07 DIAGNOSIS — K625 Hemorrhage of anus and rectum: Secondary | ICD-10-CM

## 2013-07-07 DIAGNOSIS — J189 Pneumonia, unspecified organism: Secondary | ICD-10-CM

## 2013-07-07 DIAGNOSIS — J181 Lobar pneumonia, unspecified organism: Principal | ICD-10-CM

## 2013-07-07 NOTE — Progress Notes (Signed)
Pre visit review using our clinic review tool, if applicable. No additional management support is needed unless otherwise documented below in the visit note. 

## 2013-07-07 NOTE — Progress Notes (Signed)
   Subjective:    Patient ID: Eric Richmond, male    DOB: [birth year], 73 y.o.   MRN: [id number]  HPI Here to recheck RLL pneumonia. He has finished 10 days of Levaquin and he feels much better. No more coughing or SOB.    Review of Systems  Constitutional: Negative.   Respiratory: Negative.   Cardiovascular: Negative.        Objective:   Physical Exam  Constitutional: He appears well-developed and well-nourished.  Cardiovascular: Normal rate, regular rhythm, normal heart sounds and intact distal pulses.   Pulmonary/Chest: Effort normal and breath sounds normal.          Assessment & Plan:  Resolved pneumonia.

## 2013-07-24 ENCOUNTER — Other Ambulatory Visit (INDEPENDENT_AMBULATORY_CARE_PROVIDER_SITE_OTHER): Payer: Medicare Other

## 2013-07-24 DIAGNOSIS — K625 Hemorrhage of anus and rectum: Secondary | ICD-10-CM

## 2013-07-24 LAB — CBC WITH DIFFERENTIAL/PLATELET
BASOS ABS: 0 10*3/uL (ref 0.0–0.1)
Basophils Relative: 0 % (ref 0–1)
Eosinophils Absolute: 0.3 10*3/uL (ref 0.0–0.7)
Eosinophils Relative: 4 % (ref 0–5)
HEMATOCRIT: 38.1 % — AB (ref 39.0–52.0)
Hemoglobin: 12.8 g/dL — ABNORMAL LOW (ref 13.0–17.0)
LYMPHS PCT: 33 % (ref 12–46)
Lymphs Abs: 2.4 10*3/uL (ref 0.7–4.0)
MCH: 28.7 pg (ref 26.0–34.0)
MCHC: 33.6 g/dL (ref 30.0–36.0)
MCV: 85.4 fL (ref 78.0–100.0)
Monocytes Absolute: 0.4 10*3/uL (ref 0.1–1.0)
Monocytes Relative: 6 % (ref 3–12)
NEUTROS ABS: 4.2 10*3/uL (ref 1.7–7.7)
Neutrophils Relative %: 57 % (ref 43–77)
PLATELETS: 169 10*3/uL (ref 150–400)
RBC: 4.46 MIL/uL (ref 4.22–5.81)
RDW: 14.6 % (ref 11.5–15.5)
WBC: 7.4 10*3/uL (ref 4.0–10.5)

## 2013-07-27 ENCOUNTER — Other Ambulatory Visit: Payer: Self-pay | Admitting: Family Medicine

## 2013-07-30 ENCOUNTER — Other Ambulatory Visit: Payer: Self-pay | Admitting: Family Medicine

## 2013-07-31 MED ORDER — OMEPRAZOLE 40 MG PO CPDR: 40.0000 mg | DELAYED_RELEASE_CAPSULE | Freq: Every day | ORAL | Status: DC

## 2013-07-31 NOTE — Addendum Note (Signed)
Addended by: Aggie Hacker A on: 07/31/2013 02:01 PM   Modules accepted: Orders

## 2013-08-03 ENCOUNTER — Other Ambulatory Visit: Payer: Self-pay | Admitting: Family Medicine

## 2013-10-01 ENCOUNTER — Other Ambulatory Visit: Payer: Self-pay | Admitting: Orthopedic Surgery

## 2013-10-01 NOTE — Progress Notes (Signed)
Preoperative surgical orders have been place into the Epic hospital system for [place] on 10/01/2013, 9:35 AM  by Mickel Crow for surgery on 10/26/2013.  Preop Total Knee orders including Experal, IV Tylenol, and IV Decadron as long as there are no contraindications to the above medications. Arlee Muslim, PA-C

## 2013-10-15 ENCOUNTER — Encounter (HOSPITAL_COMMUNITY): Payer: Self-pay | Admitting: Pharmacy Technician

## 2013-10-15 ENCOUNTER — Encounter (HOSPITAL_COMMUNITY): Payer: Self-pay | Admitting: Emergency Medicine

## 2013-10-15 ENCOUNTER — Other Ambulatory Visit: Payer: Self-pay | Admitting: Surgical

## 2013-10-15 ENCOUNTER — Emergency Department (HOSPITAL_COMMUNITY)
Admission: EM | Admit: 2013-10-15 | Discharge: 2013-10-15 | Disposition: A | Discharge status: Home / Self Care | Payer: Medicare Other | Attending: Emergency Medicine | Admitting: Emergency Medicine

## 2013-10-15 DIAGNOSIS — I10 Essential (primary) hypertension: Secondary | ICD-10-CM | POA: Insufficient documentation

## 2013-10-15 DIAGNOSIS — H902 Conductive hearing loss, unspecified: Secondary | ICD-10-CM | POA: Diagnosis not present

## 2013-10-15 DIAGNOSIS — K219 Gastro-esophageal reflux disease without esophagitis: Secondary | ICD-10-CM | POA: Insufficient documentation

## 2013-10-15 DIAGNOSIS — K029 Dental caries, unspecified: Secondary | ICD-10-CM | POA: Diagnosis not present

## 2013-10-15 DIAGNOSIS — G8929 Other chronic pain: Secondary | ICD-10-CM | POA: Diagnosis not present

## 2013-10-15 DIAGNOSIS — N529 Male erectile dysfunction, unspecified: Secondary | ICD-10-CM | POA: Insufficient documentation

## 2013-10-15 DIAGNOSIS — M1990 Unspecified osteoarthritis, unspecified site: Secondary | ICD-10-CM | POA: Diagnosis not present

## 2013-10-15 DIAGNOSIS — Z8669 Personal history of other diseases of the nervous system and sense organs: Secondary | ICD-10-CM | POA: Insufficient documentation

## 2013-10-15 DIAGNOSIS — K089 Disorder of teeth and supporting structures, unspecified: Secondary | ICD-10-CM | POA: Insufficient documentation

## 2013-10-15 DIAGNOSIS — Z79899 Other long term (current) drug therapy: Secondary | ICD-10-CM | POA: Diagnosis not present

## 2013-10-15 DIAGNOSIS — Z87891 Personal history of nicotine dependence: Secondary | ICD-10-CM | POA: Insufficient documentation

## 2013-10-15 DIAGNOSIS — K006 Disturbances in tooth eruption: Secondary | ICD-10-CM | POA: Diagnosis not present

## 2013-10-15 MED ORDER — TRANEXAMIC ACID 100 MG/ML IV SOLN: 1000.0000 mg | INTRAVENOUS | Status: AC

## 2013-10-15 MED ORDER — OXYCODONE-ACETAMINOPHEN 5-325 MG PO TABS: 1.0000 | ORAL_TABLET | Freq: Four times a day (QID) | ORAL | Status: DC | PRN

## 2013-10-15 NOTE — ED Notes (Signed)
Pt c/o intermittent, L side, upper, dental pain x 1 month.  Pain score 8/10.  Pt reports having a dentist, but has not been seen about this pain.

## 2013-10-15 NOTE — ED Notes (Signed)
Pt alert and oriented x4. Respirations even and unlabored, bilateral symmetrical rise and fall of chest. Skin warm and dry. In no acute distress. Denies needs.   

## 2013-10-15 NOTE — ED Provider Notes (Signed)
Medical screening examination/treatment/procedure(s) were performed by non-physician practitioner and as supervising physician I was immediately available for consultation/collaboration.   Leota Jacobsen, MD 10/15/13 [phone]

## 2013-10-15 NOTE — ED Notes (Signed)
PA at bedside.

## 2013-10-15 NOTE — ED Provider Notes (Signed)
CSN: [id number]     Arrival date & time 10/15/13  1718 History   First MD Initiated Contact with Patient 10/15/13 1820     Chief Complaint  Patient presents with  . Dental Pain     (Consider location/radiation/quality/duration/timing/severity/associated sxs/prior Treatment) HPI Comments: Eric Richmond is a 73 y.o. Male with a PMHx of HTN, GERD, OA, and OSA, who presents to the ED today with complaints of LU molar pain (#16) x 1 month. States the pain is 8/10, intermittent, nonradiating, achy, worse with palpation of tooth and chewing on that side, and previously improved with ora-gel but for the last day the pain has not been improved using this gel. States he's had this tooth filled previously, but denies any recent injury to the filling. States he's not seen his dentist for an eval because they were closed today. Denies fever, chills, HA, URI symptoms, drooling, facial pain/swelling, gum swelling, neck pain, trismus, difficulty swallowing, gum bleeding, oral lesions, CP, SOB, abd pain, N/V/D, paresthesias or weakness. Does endorse chewing tobacco use intermittently, and is a former smoker.   Patient is a 73 y.o. male presenting with tooth pain. The history is provided by the patient. No language interpreter was used.  Dental Pain Location:  Upper Upper teeth location:  16/LU 3rd molar Quality:  Aching Severity:  Mild Onset quality:  Gradual Duration:  1 month Timing:  Intermittent Progression:  Unchanged Chronicity:  Recurrent Context: dental caries and poor dentition   Relieved by:  Nothing Worsened by:  Touching and pressure Ineffective treatments:  Topical anesthetic gel Associated symptoms: no congestion, no difficulty swallowing, no drooling, no facial pain, no facial swelling, no fever, no gum swelling, no headaches, no neck pain, no neck swelling, no oral bleeding, no oral lesions and no trismus   Risk factors: chewing tobacco use, lack of dental care and periodontal disease      Past Medical History  Diagnosis Date  . Unspecified essential hypertension   . GERD (gastroesophageal reflux disease)   . ED (erectile dysfunction)   . Osteoarthritis   . OSA (obstructive sleep apnea)     per Dr. Gwenette Greet   . Hard of hearing   . Anal fissure 20 years ago  . Allergy    Past Surgical History  Procedure Laterality Date  . Knee arthroscopy      left knee  . Colonoscopy  06-11-13    per Dr. Hilarie Fredrickson, had 8 adenomatous polyps removed, repeat in 3 years    Family History  Problem Relation Age of Onset  . Diabetes    . Hypertension    . Breast cancer Sister   . Diabetes Sister   . Hypertension Sister   . Thyroid cancer Sister   . Colon cancer Neg Hx   . Esophageal cancer Neg Hx   . Rectal cancer Neg Hx   . Stomach cancer Neg Hx   . Diabetes Mother   . Hypertension Mother   . Diabetes Father   . Hypertension Father   . Diabetes Brother   . Hypertension Brother    History  Substance Use Topics  . Smoking status: Former Smoker -- 0.30 packs/day for 20 years    Types: Cigarettes    Quit date: 02/26/1990  . Smokeless tobacco: Current User    Types: Chew     Comment: occ  . Alcohol Use: No    Review of Systems  Constitutional: Negative for fever.  HENT: Positive for dental problem. Negative for congestion, drooling,  facial swelling, mouth sores, sinus pressure, sore throat and trouble swallowing.   Respiratory: Negative for shortness of breath.   Cardiovascular: Negative for chest pain.  Gastrointestinal: Negative for nausea, vomiting, abdominal pain and diarrhea.  Musculoskeletal: Negative for arthralgias, myalgias, neck pain and neck stiffness.  Skin: Negative for color change.  Neurological: Negative for syncope, weakness, numbness and headaches.  Hematological: Does not bruise/bleed easily.  10 Systems reviewed and are negative for acute change except as noted in the HPI.     Allergies  Review of patient's allergies indicates no known  allergies.  Home Medications   Prior to Admission medications   Medication Sig Start Date End Date Taking? Authorizing Provider  atenolol (TENORMIN) 50 MG tablet Take 50 mg by mouth every morning.   Yes Historical Provider, MD  cetirizine (ZYRTEC) 10 MG tablet Take 10 mg by mouth daily as needed for allergies.   Yes Historical Provider, MD  losartan-hydrochlorothiazide (HYZAAR) 100-25 MG per tablet Take 1 tablet by mouth every morning.   Yes Historical Provider, MD  Multiple Vitamin (MULTIVITAMIN WITH MINERALS) TABS tablet Take 1 tablet by mouth daily.   Yes Historical Provider, MD  omeprazole (PRILOSEC) 40 MG capsule Take 1 capsule (40 mg total) by mouth daily. 07/31/13  Yes [name], MD  tadalafil (CIALIS) 20 MG tablet Take 1 tablet (20 mg total) by mouth as needed for erectile dysfunction. 03/18/13  Yes [name], MD  oxyCODONE-acetaminophen (PERCOCET) 5-325 MG per tablet Take 1-2 tablets by mouth every 6 (six) hours as needed for severe pain. 10/15/13   Angelena Sand Strupp Camprubi-Soms, PA-C   BP 182/88  Pulse 64  Temp(Src) 98.2 F (36.8 C) (Oral)  Resp 20  SpO2 98% Physical Exam  Nursing note and vitals reviewed. Constitutional: He is oriented to person, place, and time. Vital signs are normal. He appears well-developed and well-nourished. No distress.  Afebrile nontoxic NAD  HENT:  Head: Normocephalic and atraumatic.  Nose: Nose normal.  Mouth/Throat: Oropharynx is clear and moist and mucous membranes are normal. He does not have dentures. No oral lesions. No trismus in the jaw. Abnormal dentition. Dental caries present. No dental abscesses or uvula swelling.  MMM, oropharynx clear, buccal mucosal free of lesions or erythema. Poor dentitia throughout, with LU molar #16 eroded with prior filling in place, no abscess noted. Tooth tender but surrounding gumline nonerythemous and nontender.  Eyes: Conjunctivae and EOM are normal. Right eye exhibits no discharge. Left eye exhibits no  discharge.  Neck: Normal range of motion. Neck supple.  Cardiovascular: Normal rate.   Pulmonary/Chest: Effort normal.  Abdominal: Normal appearance. He exhibits no distension.  Musculoskeletal: Normal range of motion.  Neurological: He is alert and oriented to person, place, and time.  Sensation over CN V distribution intact  Skin: Skin is warm, dry and intact. No rash noted. No erythema.  No erythema or swelling to face  Psychiatric: He has a normal mood and affect.    ED Course  Procedures (including critical care time) Labs Review Labs Reviewed - No data to display  Imaging Review No results found.   EKG Interpretation None      MDM   Final diagnoses:  Chronic dental pain    73y/o male here with LU molar pain. Patient afebrile, non toxic appearing and swallowing secretions well. Dental pain associated with poor oral dentitia, no s/sx of GCA or other concerning condition. I gave patient referral to dentist and stressed the importance of dental follow up  for ultimate management of dental pain. No signs of abscess or infection  I have also discussed reasons to return immediately to the ER.  Patient expresses understanding and agrees with plan. Rx for percocet given. Discussed importance of cessation of smoking/chewing tobacco.  BP 182/88  Pulse 64  Temp(Src) 98.2 F (36.8 C) (Oral)  Resp 20  SpO2 98%  Meds ordered this encounter  Medications  . Multiple Vitamin (MULTIVITAMIN WITH MINERALS) TABS tablet    Sig: Take 1 tablet by mouth daily.  Marland Kitchen oxyCODONE-acetaminophen (PERCOCET) 5-325 MG per tablet    Sig: Take 1-2 tablets by mouth every 6 (six) hours as needed for severe pain.    Dispense:  6 tablet    Refill:  0    Order Specific Question:  Supervising Provider    Answer:  Johnna Acosta [address] Camprubi-Soms, PA-C 10/15/13 [id number]

## 2013-10-15 NOTE — H&P (Signed)
TOTAL KNEE ADMISSION H&P  Patient is being admitted for left total knee arthroplasty.  Subjective:  Chief Complaint:left knee pain.  HPI: Eric Richmond, 73 y.o. male, has a history of pain and functional disability in the left knee due to arthritis and has failed non-surgical conservative treatments for greater than 12 weeks to includeNSAID's and/or analgesics, corticosteriod injections and activity modification.  Onset of symptoms was gradual, starting 10 years ago with gradually worsening course since that time. The patient noted prior procedures on the knee to include  arthroscopy and menisectomy on the left knee(s).  Patient currently rates pain in the left knee(s) at 7 out of 10 with activity. Patient has night pain, worsening of pain with activity and weight bearing, pain that interferes with activities of daily living, pain with passive range of motion, crepitus and joint swelling.  Patient has evidence of periarticular osteophytes and joint space narrowing by imaging studies.  There is no active infection.  Patient Active Problem List   Diagnosis Date Noted  . Type II or unspecified type diabetes mellitus without mention of complication, not stated as uncontrolled 03/18/2013  . OSA (obstructive sleep apnea) 07/21/2010  . OSTEOARTHRITIS 01/17/2010  . ABDOMINAL PAIN, EPIGASTRIC 02/11/2009  . HYPERTENSION 10/22/2006  . GERD 10/22/2006   Past Medical History  Diagnosis Date  . Unspecified essential hypertension   . GERD (gastroesophageal reflux disease)   . ED (erectile dysfunction)   . Osteoarthritis   . OSA (obstructive sleep apnea)     per Dr. Gwenette Greet   . Hard of hearing   . Anal fissure 20 years ago  . Allergy     Past Surgical History  Procedure Laterality Date  . Knee arthroscopy      left knee  . Colonoscopy  06-11-13    per Dr. Hilarie Fredrickson, had 8 adenomatous polyps removed, repeat in 3 years      Current outpatient prescriptions: albuterol (PROVENTIL HFA;VENTOLIN HFA)  108 (90 BASE) MCG/ACT inhaler, Inhale 2 puffs into the lungs every 6 (six) hours as needed for wheezing or shortness of breath., Disp: 1 Inhaler, Rfl: 2;   atenolol (TENORMIN) 50 MG tablet, Take 50 mg by mouth every morning., Disp: , Rfl: ;   cetirizine (ZYRTEC) 10 MG tablet, Take 10 mg by mouth daily as needed for allergies., Disp: , Rfl:  losartan-hydrochlorothiazide (HYZAAR) 100-25 MG per tablet, Take 1 tablet by mouth every morning., Disp: , Rfl: ;   omeprazole (PRILOSEC) 40 MG capsule, Take 1 capsule (40 mg total) by mouth daily., Disp: 90 capsule, Rfl: 1;   tadalafil (CIALIS) 20 MG tablet, Take 1 tablet (20 mg total) by mouth as needed for erectile dysfunction., Disp: 10 tablet, Rfl: 11  No Known Allergies  History  Substance Use Topics  . Smoking status: Former Smoker -- 0.30 packs/day for 20 years    Types: Cigarettes    Quit date: 02/26/1990  . Smokeless tobacco: Current User    Types: Chew     Comment: occ  . Alcohol Use: No    Family History  Problem Relation Age of Onset  . Diabetes    . Hypertension    . Breast cancer Sister   . Diabetes Sister   . Hypertension Sister   . Thyroid cancer Sister   . Colon cancer Neg Hx   . Esophageal cancer Neg Hx   . Rectal cancer Neg Hx   . Stomach cancer Neg Hx   . Diabetes Mother   . Hypertension Mother   .  Diabetes Father   . Hypertension Father   . Diabetes Brother   . Hypertension Brother      Review of Systems  Constitutional: Negative.   HENT: Positive for tinnitus. Negative for congestion, ear discharge, ear pain, hearing loss, nosebleeds and sore throat.   Eyes: Negative.   Respiratory: Negative.  Negative for stridor.   Cardiovascular: Negative.   Gastrointestinal: Negative.   Genitourinary: Negative.   Musculoskeletal: Positive for joint pain. Negative for back pain, falls, myalgias and neck pain.       Bilateral knee pain  Skin: Negative.   Neurological: Negative.  Negative for headaches.   Endo/Heme/Allergies: Positive for environmental allergies. Negative for polydipsia. Does not bruise/bleed easily.  Psychiatric/Behavioral: Negative.     Objective:  Physical Exam  Constitutional: He is oriented to person, place, and time. He appears well-developed and well-nourished. No distress.  HENT:  Head: Normocephalic and atraumatic.  Right Ear: External ear normal.  Left Ear: External ear normal.  Nose: Nose normal.  Mouth/Throat: Oropharynx is clear and moist.  Eyes: Conjunctivae and EOM are normal.  Neck: Normal range of motion. Neck supple.  Cardiovascular: Normal rate, regular rhythm, normal heart sounds and intact distal pulses.   No murmur heard. Respiratory: Effort normal and breath sounds normal. No respiratory distress. He has no wheezes.  GI: Soft. Bowel sounds are normal. He exhibits no distension. There is no tenderness.  Musculoskeletal:       Right hip: Normal.       Left hip: Normal.       Right knee: He exhibits decreased range of motion, swelling and effusion. He exhibits no erythema. Tenderness found. Medial joint line and lateral joint line tenderness noted.       Left knee: He exhibits decreased range of motion and swelling. He exhibits no effusion and no erythema. Tenderness found. Medial joint line and lateral joint line tenderness noted.       Right lower leg: He exhibits no tenderness and no swelling.       Left lower leg: He exhibits no tenderness and no swelling.  Evaluation of his hips show a normal range of motion with no discomfort. Left knee with no effusion. Range of motion about 5-120 degrees. There is moderate crepitus on range of motion. He is tender medial greater than lateral with no instability.Right knee with effusion. Range of motion 5-130 degrees. Moderate crepitus on range of motion. Tender medial greater than lateral with no instability.  Neurological: He is alert and oriented to person, place, and time. He has normal strength and normal  reflexes. No sensory deficit.  Skin: No rash noted. He is not diaphoretic. No erythema.  Psychiatric: He has a normal mood and affect. His behavior is normal.   Vitals BP: 120/75 (Sitting, Left Arm, Standard) HR: 72 bpm  Estimated body mass index is 34.23 kg/(m^2) as calculated from the following:   Height as of 07/07/13: 5\' 6"  (1.676 m).   Weight as of 07/07/13: 96.163 kg (212 lb).   Imaging Review Plain radiographs demonstrate severe degenerative joint disease of the left knee(s). The overall alignment ismild varus. The bone quality appears to be good for age and reported activity level.  Assessment/Plan:  End stage arthritis, left knee   The patient history, physical examination, clinical judgment of the provider and imaging studies are consistent with end stage degenerative joint disease of the left knee(s) and total knee arthroplasty is deemed medically necessary. The treatment options including medical management, injection therapy  arthroscopy and arthroplasty were discussed at length. The risks and benefits of total knee arthroplasty were presented and reviewed. The risks due to aseptic loosening, infection, stiffness, patella tracking problems, thromboembolic complications and other imponderables were discussed. The patient acknowledged the explanation, agreed to proceed with the plan and consent was signed. Patient is being admitted for inpatient treatment for surgery, pain control, PT, OT, prophylactic antibiotics, VTE prophylaxis, progressive ambulation and ADL's and discharge planning. The patient is planning to be discharged home with home health services   PCP: Dr. Deretha Emory, PA-C

## 2013-10-15 NOTE — Discharge Instructions (Signed)
Apply warm compresses to jaw throughout the day. Take percocet as directed, as needed for pain but do not drive or operate machinery with pain medication use. Followup with a dentist is very important for ongoing evaluation and management of recurrent dental pain. Return to emergency department for emergent changing or worsening symptoms.   Dental Pain Toothache is pain in or around a tooth. It may get worse with chewing or with cold or heat.  HOME CARE  Your dentist may use a numbing medicine during treatment. If so, you may need to avoid eating until the medicine wears off. Ask your dentist about this.  Only take medicine as told by your dentist or doctor.  Avoid chewing food near the painful tooth until after all treatment is done. Ask your dentist about this. GET HELP RIGHT AWAY IF:   The problem gets worse or new problems appear.  You have a fever.  There is redness and puffiness (swelling) of the face, jaw, or neck.  You cannot open your mouth.  There is pain in the jaw.  There is very bad pain that is not helped by medicine. MAKE SURE YOU:   Understand these instructions.  Will watch your condition.  Will get help right away if you are not doing well or get worse. Document Released: 08/01/2007 Document Revised: 05/07/2011 Document Reviewed: 08/01/2007 [facility] Patient Information 2015 [place], [place]. This information is not intended to replace advice given to you by your health care provider. Make sure you discuss any questions you have with your health care provider.  Emergency Department Resource Guide 1) Find a Doctor and Pay Out of Pocket Although you won't have to find out who is covered by your insurance plan, it is a good idea to ask around and get recommendations. You will then need to call the office and see if the doctor you have chosen will accept you as a new patient and what types of options they offer for patients who are self-pay. Some doctors offer  discounts or will set up payment plans for their patients who do not have insurance, but you will need to ask so you aren't surprised when you get to your appointment.  2) Contact Your Local Health Department Not all health departments have doctors that can see patients for sick visits, but many do, so it is worth a call to see if yours does. If you don't know where your local health department is, you can check in your phone book. The CDC also has a tool to help you locate your state's health department, and many state websites also have listings of all of their local health departments.  3) Find a [facility] If your illness is not likely to be very severe or complicated, you may want to try a walk in clinic. These are popping up all over the country in pharmacies, drugstores, and shopping centers. They're usually staffed by nurse practitioners or physician assistants that have been trained to treat common illnesses and complaints. They're usually fairly quick and inexpensive. However, if you have serious medical issues or chronic medical problems, these are probably not your best option.    Dental Care: Organization         Address  Phone  Notes  [facility] Department of [facility] [place] [phone] Accepts children up to age 55 who are enrolled in [place] or [place]; pregnant women with a Medicaid card; and children who have  applied for Medicaid or [place] Health Choice, but were declined, whose parents can pay a reduced fee at time of service.  [facility] Department of [facility]  [address], [place] [phone] Accepts children up to age 94 who are enrolled in [place] or [place]; pregnant women with a Medicaid card; and children who have applied for Medicaid or [place] Health Choice, but were declined, whose parents can pay a reduced fee at time of service.  [facility]  PRO[facility]  [place] [phone] Patients are seen by appointment only. Walk-ins are not accepted. [place] will see patients 38 years of age and older. Monday - Tuesday (8am-5pm) Most Wednesdays (8:30-5pm) $30 per visit, cash only  [facility] Adult [facility] Access PRO[facility]  [address], [facility] [phone] Patients are seen by appointment only. Walk-ins are not accepted. [place] will see patients 55 years of age and older. One Wednesday Evening (Monthly: Volunteer Based).  $30 per visit, cash only  [place]  [phone] for adults; Children under age 65, call Graduate Pediatric Dentistry at [phone]. Children aged 34-14, please call [phone] to request a pediatric application.  Dental services are provided in all areas of dental care including fillings, crowns and bridges, complete and partial dentures, implants, gum treatment, root canals, and extractions. Preventive care is also provided. Treatment is provided to both adults and children. Patients are selected via a lottery and there is often a waiting list.   [facility] [address] , [place]  [phone] [email or web address]   [facility] [address]  [place], [place] [phone], Ext. [phone] Second and Fourth Thursday of each month, opens at 6:30 AM; Clinic ends at 9 AM.  Patients are seen on a first-come first-served basis, and a limited number are seen during each clinic.   [facility]  [address] [place], [place] [phone]   Eligibility Requirements You must have lived in [place], [place], or [place] counties for at least the last three months.   You cannot be eligible for state or federal sponsored [employer], including [employer], [place], or [employer].   You generally cannot be eligible for healthcare insurance through your employer.    How to apply: Eligibility  screenings are held every Tuesday and Wednesday afternoon from 1:00 pm until 4:00 pm. You do not need an appointment for the interview!  [facility] [address] [place], [place]   [place]  [place]  [phone]

## 2013-10-16 NOTE — Patient Instructions (Signed)
Eric Richmond  10/16/2013   Your procedure is scheduled on:  10/26/2013   Report to [facility].  Follow the Signs to [place] at     0600   am  Call this number if you have problems the morning of surgery: [phone]   Remember:   Do not eat food or drink liquids after midnight.   Take these medicines the morning of surgery with A SIP OF WATER:    Do not wear jewelry,   Do not wear lotions, powders, or perfumes. , deodorant   . Men may shave face and neck.  Do not bring valuables to the hospital.  Contacts, dentures or bridgework may not be worn into surgery.  Leave suitcase in the car. After surgery it may be brought to your room.  For patients admitted to the hospital, checkout time is 11:00 AM the day of  discharge.     [facility] - Preparing for Surgery Before surgery, you can play an important role.  Because skin is not sterile, your skin needs to be as free of germs as possible.  You can reduce the number of germs on your skin by washing with CHG (chlorahexidine gluconate) soap before surgery.  CHG is an antiseptic cleaner which kills germs and bonds with the skin to continue killing germs even after washing. Please DO NOT use if you have an allergy to CHG or antibacterial soaps.  If your skin becomes reddened/irritated stop using the CHG and inform your nurse when you arrive at Short Stay. Do not shave (including legs and underarms) for at least 48 hours prior to the first CHG shower.  You may shave your face/neck. Please follow these instructions carefully:  1.  Shower with CHG Soap the night before surgery and the  morning of Surgery.  2.  If you choose to wash your hair, wash your hair first as usual with your  normal  shampoo.  3.  After you shampoo, rinse your hair and body thoroughly to remove the  shampoo.                           4.  Use CHG as you would any other liquid soap.  You can apply chg directly  to the skin and wash   Gently with a scrungie or clean washcloth.  5.  Apply the CHG Soap to your body ONLY FROM THE NECK DOWN.   Do not use on face/ open                           Wound or open sores. Avoid contact with eyes, ears mouth and genitals (private parts).                       Wash face,  Genitals (private parts) with your normal soap.             6.  Wash thoroughly, paying special attention to the area where your surgery  will be performed.  7.  Thoroughly rinse your body with warm water from the neck down.  8.  DO NOT shower/wash with your normal soap after using and rinsing off  the CHG Soap.                9.  Pat yourself dry with a clean towel.  10.  Wear clean pajamas.            11.  Place clean sheets on your bed the night of your first shower and do not  sleep with pets. Day of Surgery : Do not apply any lotions/deodorants the morning of surgery.  Please wear clean clothes to the hospital/surgery center.  FAILURE TO FOLLOW THESE INSTRUCTIONS MAY RESULT IN THE CANCELLATION OF YOUR SURGERY PATIENT SIGNATURE_________________________________  NURSE SIGNATURE__________________________________  ________________________________________________________________________  WHAT IS A BLOOD TRANSFUSION? Blood Transfusion Information  A transfusion is the replacement of blood or some of its parts. Blood is made up of multiple cells which provide different functions.  Red blood cells carry oxygen and are used for blood loss replacement.  White blood cells fight against infection.  Platelets control bleeding.  Plasma helps clot blood.  Other blood products are available for specialized needs, such as hemophilia or other clotting disorders. BEFORE THE TRANSFUSION  Who gives blood for transfusions?   Healthy volunteers who are fully evaluated to make sure their blood is safe. This is blood bank blood. Transfusion therapy is the safest it has ever been in the practice of medicine. Before  blood is taken from a donor, a complete history is taken to make sure that person has no history of diseases nor engages in risky social behavior (examples are intravenous drug use or sexual activity with multiple partners). The donor's travel history is screened to minimize risk of transmitting infections, such as malaria. The donated blood is tested for signs of infectious diseases, such as HIV and hepatitis. The blood is then tested to be sure it is compatible with you in order to minimize the chance of a transfusion reaction. If you or a relative donates blood, this is often done in anticipation of surgery and is not appropriate for emergency situations. It takes many days to process the donated blood. RISKS AND COMPLICATIONS Although transfusion therapy is very safe and saves many lives, the main dangers of transfusion include:   Getting an infectious disease.  Developing a transfusion reaction. This is an allergic reaction to something in the blood you were given. Every precaution is taken to prevent this. The decision to have a blood transfusion has been considered carefully by your caregiver before blood is given. Blood is not given unless the benefits outweigh the risks. AFTER THE TRANSFUSION  Right after receiving a blood transfusion, you will usually feel much better and more energetic. This is especially true if your red blood cells have gotten low (anemic). The transfusion raises the level of the red blood cells which carry oxygen, and this usually causes an energy increase.  The nurse administering the transfusion will monitor you carefully for complications. HOME CARE INSTRUCTIONS  No special instructions are needed after a transfusion. You may find your energy is better. Speak with your caregiver about any limitations on activity for underlying diseases you may have. SEEK MEDICAL CARE IF:   Your condition is not improving after your transfusion.  You develop redness or irritation  at the intravenous (IV) site. SEEK IMMEDIATE MEDICAL CARE IF:  Any of the following symptoms occur over the next 12 hours:  Shaking chills.  You have a temperature by mouth above 102 F (38.9 C), not controlled by medicine.  Chest, back, or muscle pain.  People around you feel you are not acting correctly or are confused.  Shortness of breath or difficulty breathing.  Dizziness and fainting.  You get a rash or develop  hives.  You have a decrease in urine output.  Your urine turns a dark color or changes to pink, red, or brown. Any of the following symptoms occur over the next 10 days:  You have a temperature by mouth above 102 F (38.9 C), not controlled by medicine.  Shortness of breath.  Weakness after normal activity.  The white part of the eye turns yellow (jaundice).  You have a decrease in the amount of urine or are urinating less often.  Your urine turns a dark color or changes to pink, red, or brown. Document Released: 02/10/2000 Document Revised: 05/07/2011 Document Reviewed: 09/29/2007 ExitCare Patient Information 2014 [place].  _______________________________________________________________________  Incentive Spirometer  An incentive spirometer is a tool that can help keep your lungs clear and active. This tool measures how well you are filling your lungs with each breath. Taking long deep breaths may help reverse or decrease the chance of developing breathing (pulmonary) problems (especially infection) following:  A long period of time when you are unable to move or be active. BEFORE THE PROCEDURE   If the spirometer includes an indicator to show your best effort, your nurse or respiratory therapist will set it to a desired goal.  If possible, sit up straight or lean slightly forward. Try not to slouch.  Hold the incentive spirometer in an upright position. INSTRUCTIONS FOR USE  1. Sit on the edge of your bed if possible, or sit up as far as  you can in bed or on a chair. 2. Hold the incentive spirometer in an upright position. 3. Breathe out normally. 4. Place the mouthpiece in your mouth and seal your lips tightly around it. 5. Breathe in slowly and as deeply as possible, raising the piston or the ball toward the top of the column. 6. Hold your breath for 3-5 seconds or for as long as possible. Allow the piston or ball to fall to the bottom of the column. 7. Remove the mouthpiece from your mouth and breathe out normally. 8. Rest for a few seconds and repeat Steps 1 through 7 at least 10 times every 1-2 hours when you are awake. Take your time and take a few normal breaths between deep breaths. 9. The spirometer may include an indicator to show your best effort. Use the indicator as a goal to work toward during each repetition. 10. After each set of 10 deep breaths, practice coughing to be sure your lungs are clear. If you have an incision (the cut made at the time of surgery), support your incision when coughing by placing a pillow or rolled up towels firmly against it. Once you are able to get out of bed, walk around indoors and cough well. You may stop using the incentive spirometer when instructed by your caregiver.  RISKS AND COMPLICATIONS  Take your time so you do not get dizzy or light-headed.  If you are in pain, you may need to take or ask for pain medication before doing incentive spirometry. It is harder to take a deep breath if you are having pain. AFTER USE  Rest and breathe slowly and easily.  It can be helpful to keep track of a log of your progress. Your caregiver can provide you with a simple table to help with this. If you are using the spirometer at home, follow these instructions: [place] IF:   You are having difficultly using the spirometer.  You have trouble using the spirometer as often as instructed.  Your  pain medication is not giving enough relief while using the spirometer.  You develop  fever of 100.5 F (38.1 C) or higher. SEEK IMMEDIATE MEDICAL CARE IF:   You cough up bloody sputum that had not been present before.  You develop fever of 102 F (38.9 C) or greater.  You develop worsening pain at or near the incision site. MAKE SURE YOU:   Understand these instructions.  Will watch your condition.  Will get help right away if you are not doing well or get worse. Document Released: 06/25/2006 Document Revised: 05/07/2011 Document Reviewed: 08/26/2006 ExitCare Patient Information 2014 ExitCare, [place].   ________________________________________________________________________    Please read over the following fact sheets that you were given: MRSA Information, coughing and deep breathing exercises, leg exercises

## 2013-10-19 ENCOUNTER — Encounter (HOSPITAL_COMMUNITY)
Admission: RE | Admit: 2013-10-19 | Discharge: 2013-10-19 | Disposition: A | Discharge status: Home / Self Care | Payer: Medicare Other | Source: Ambulatory Visit | Attending: Orthopedic Surgery | Admitting: Orthopedic Surgery

## 2013-10-19 ENCOUNTER — Encounter (HOSPITAL_COMMUNITY): Payer: Self-pay

## 2013-10-19 DIAGNOSIS — Z01818 Encounter for other preprocedural examination: Secondary | ICD-10-CM | POA: Diagnosis present

## 2013-10-19 DIAGNOSIS — M1710 Unilateral primary osteoarthritis, unspecified knee: Secondary | ICD-10-CM | POA: Diagnosis present

## 2013-10-19 LAB — SURGICAL PCR SCREEN
MRSA, PCR: NEGATIVE
STAPHYLOCOCCUS AUREUS: NEGATIVE

## 2013-10-19 LAB — COMPREHENSIVE METABOLIC PANEL
ALT: 20 U/L (ref 0–53)
AST: 23 U/L (ref 0–37)
Albumin: 3.4 g/dL — ABNORMAL LOW (ref 3.5–5.2)
Alkaline Phosphatase: 107 U/L (ref 39–117)
Anion gap: 12 (ref 5–15)
BUN: 15 mg/dL (ref 6–23)
CHLORIDE: 104 meq/L (ref 96–112)
CO2: 25 meq/L (ref 19–32)
CREATININE: 0.9 mg/dL (ref 0.50–1.35)
Calcium: 9 mg/dL (ref 8.4–10.5)
GFR calc Af Amer: >90 mL/min (ref >90)
GFR, EST NON AFRICAN AMERICAN: 82 mL/min — AB (ref >90)
Glucose, Bld: 233 mg/dL — ABNORMAL HIGH (ref 70–99)
Potassium: 3.9 mEq/L (ref 3.7–5.3)
Sodium: 141 mEq/L (ref 137–147)
Total Bilirubin: 0.6 mg/dL (ref 0.3–1.2)
Total Protein: 6.9 g/dL (ref 6.0–8.3)

## 2013-10-19 LAB — CBC
HEMATOCRIT: 40 % (ref 39.0–52.0)
Hemoglobin: 13.4 g/dL (ref 13.0–17.0)
MCH: 28.8 pg (ref 26.0–34.0)
MCHC: 33.5 g/dL (ref 30.0–36.0)
MCV: 85.8 fL (ref 78.0–100.0)
PLATELETS: 174 10*3/uL (ref 150–400)
RBC: 4.66 MIL/uL (ref 4.22–5.81)
RDW: 13.9 % (ref 11.5–15.5)
WBC: 6.4 10*3/uL (ref 4.0–10.5)

## 2013-10-19 LAB — URINALYSIS, ROUTINE W REFLEX MICROSCOPIC
BILIRUBIN URINE: NEGATIVE
Glucose, UA: 250 mg/dL — AB
Ketones, ur: NEGATIVE mg/dL
Leukocytes, UA: NEGATIVE
Nitrite: NEGATIVE
PH: 6 (ref 5.0–8.0)
Protein, ur: NEGATIVE mg/dL
SPECIFIC GRAVITY, URINE: 1.026 (ref 1.005–1.030)
UROBILINOGEN UA: 0.2 mg/dL (ref 0.0–1.0)

## 2013-10-19 LAB — URINE MICROSCOPIC-ADD ON

## 2013-10-19 LAB — APTT: aPTT: 30 seconds (ref 24–37)

## 2013-10-19 LAB — PROTIME-INR
INR: 1.01 (ref 0.00–1.49)
PROTHROMBIN TIME: 13.3 s (ref 11.6–15.2)

## 2013-10-19 NOTE — Progress Notes (Signed)
Clearance note Dr Sarajane Jews 03/18/2013 on chart

## 2013-10-19 NOTE — Progress Notes (Signed)
CMP results faxed per EPIC to Dr Maureen Ralphs

## 2013-10-19 NOTE — Progress Notes (Signed)
Urinalysis and micro send per fax via EPIC to Dr Maureen Ralphs

## 2013-10-26 ENCOUNTER — Encounter (HOSPITAL_COMMUNITY): Payer: Medicare Other | Admitting: Anesthesiology

## 2013-10-26 ENCOUNTER — Encounter (HOSPITAL_COMMUNITY)
Admission: RE | Disposition: A | Discharge status: Home Health | Payer: Self-pay | Source: Ambulatory Visit | Attending: Orthopedic Surgery

## 2013-10-26 ENCOUNTER — Inpatient Hospital Stay (HOSPITAL_COMMUNITY): Payer: Medicare Other | Admitting: Anesthesiology

## 2013-10-26 ENCOUNTER — Inpatient Hospital Stay (HOSPITAL_COMMUNITY)
Admission: RE | Admit: 2013-10-26 | Discharge: 2013-10-28 | DRG: 470 | Disposition: A | Discharge status: Home Health | Payer: Medicare Other | Source: Ambulatory Visit | Attending: Orthopedic Surgery | Admitting: Orthopedic Surgery

## 2013-10-26 ENCOUNTER — Encounter (HOSPITAL_COMMUNITY): Payer: Self-pay | Admitting: *Deleted

## 2013-10-26 DIAGNOSIS — Z833 Family history of diabetes mellitus: Secondary | ICD-10-CM

## 2013-10-26 DIAGNOSIS — M25469 Effusion, unspecified knee: Secondary | ICD-10-CM | POA: Diagnosis present

## 2013-10-26 DIAGNOSIS — Z87891 Personal history of nicotine dependence: Secondary | ICD-10-CM | POA: Diagnosis not present

## 2013-10-26 DIAGNOSIS — E119 Type 2 diabetes mellitus without complications: Secondary | ICD-10-CM | POA: Diagnosis present

## 2013-10-26 DIAGNOSIS — M1712 Unilateral primary osteoarthritis, left knee: Secondary | ICD-10-CM

## 2013-10-26 DIAGNOSIS — Z8249 Family history of ischemic heart disease and other diseases of the circulatory system: Secondary | ICD-10-CM | POA: Diagnosis not present

## 2013-10-26 DIAGNOSIS — Z79899 Other long term (current) drug therapy: Secondary | ICD-10-CM | POA: Diagnosis not present

## 2013-10-26 DIAGNOSIS — M1710 Unilateral primary osteoarthritis, unspecified knee: Secondary | ICD-10-CM | POA: Diagnosis present

## 2013-10-26 DIAGNOSIS — K219 Gastro-esophageal reflux disease without esophagitis: Secondary | ICD-10-CM | POA: Diagnosis present

## 2013-10-26 DIAGNOSIS — Z96652 Presence of left artificial knee joint: Secondary | ICD-10-CM

## 2013-10-26 DIAGNOSIS — M25569 Pain in unspecified knee: Secondary | ICD-10-CM | POA: Diagnosis present

## 2013-10-26 DIAGNOSIS — Z808 Family history of malignant neoplasm of other organs or systems: Secondary | ICD-10-CM | POA: Diagnosis not present

## 2013-10-26 DIAGNOSIS — I10 Essential (primary) hypertension: Secondary | ICD-10-CM | POA: Diagnosis present

## 2013-10-26 DIAGNOSIS — G4733 Obstructive sleep apnea (adult) (pediatric): Secondary | ICD-10-CM | POA: Diagnosis present

## 2013-10-26 DIAGNOSIS — Z803 Family history of malignant neoplasm of breast: Secondary | ICD-10-CM

## 2013-10-26 DIAGNOSIS — Z01812 Encounter for preprocedural laboratory examination: Secondary | ICD-10-CM

## 2013-10-26 DIAGNOSIS — M179 Osteoarthritis of knee, unspecified: Secondary | ICD-10-CM | POA: Diagnosis present

## 2013-10-26 HISTORY — PX: TOTAL KNEE ARTHROPLASTY: SHX125

## 2013-10-26 LAB — TYPE AND SCREEN
ABO/RH(D): O POS
Antibody Screen: NEGATIVE

## 2013-10-26 LAB — ABO/RH: ABO/RH(D): O POS

## 2013-10-26 SURGERY — ARTHROPLASTY, KNEE, TOTAL
Anesthesia: Spinal | Site: Knee | Laterality: Left

## 2013-10-26 MED ORDER — BUPIVACAINE HCL 0.25 % IJ SOLN
INTRAMUSCULAR | Status: DC | PRN
  Administered 2013-10-26: 20 mL

## 2013-10-26 MED ORDER — METHOCARBAMOL 1000 MG/10ML IJ SOLN
500.0000 mg | Freq: Four times a day (QID) | INTRAVENOUS | Status: DC | PRN
  Administered 2013-10-26: 500 mg via INTRAVENOUS
  Filled 2013-10-26: qty 5

## 2013-10-26 MED ORDER — ACETAMINOPHEN 325 MG PO TABS: 650.0000 mg | ORAL_TABLET | Freq: Four times a day (QID) | ORAL | Status: DC | PRN

## 2013-10-26 MED ORDER — CEFAZOLIN SODIUM-DEXTROSE 2-3 GM-% IV SOLR
INTRAVENOUS | Status: AC
Start: 2013-10-26 — End: 2013-10-26
  Filled 2013-10-26: qty 50

## 2013-10-26 MED ORDER — HYDROMORPHONE HCL PF 1 MG/ML IJ SOLN: 0.2500 mg | INTRAMUSCULAR | Status: DC | PRN

## 2013-10-26 MED ORDER — BUPIVACAINE HCL (PF) 0.25 % IJ SOLN
INTRAMUSCULAR | Status: AC
  Filled 2013-10-26: qty 30

## 2013-10-26 MED ORDER — METOCLOPRAMIDE HCL 10 MG PO TABS: 5.0000 mg | ORAL_TABLET | Freq: Three times a day (TID) | ORAL | Status: DC | PRN

## 2013-10-26 MED ORDER — LOSARTAN POTASSIUM 50 MG PO TABS
100.0000 mg | ORAL_TABLET | Freq: Every day | ORAL | Status: DC
  Administered 2013-10-27 – 2013-10-28 (×2): 100 mg via ORAL
  Filled 2013-10-26 (×3): qty 2

## 2013-10-26 MED ORDER — DEXAMETHASONE SODIUM PHOSPHATE 10 MG/ML IJ SOLN
INTRAMUSCULAR | Status: AC
  Filled 2013-10-26: qty 1

## 2013-10-26 MED ORDER — ACETAMINOPHEN 500 MG PO TABS
1000.0000 mg | ORAL_TABLET | Freq: Four times a day (QID) | ORAL | Status: AC
  Administered 2013-10-26 – 2013-10-27 (×4): 1000 mg via ORAL
  Filled 2013-10-26 (×3): qty 2

## 2013-10-26 MED ORDER — PROPOFOL 10 MG/ML IV BOLUS
INTRAVENOUS | Status: AC
  Filled 2013-10-26: qty 20

## 2013-10-26 MED ORDER — METHOCARBAMOL 500 MG PO TABS
500.0000 mg | ORAL_TABLET | Freq: Four times a day (QID) | ORAL | Status: DC | PRN
  Administered 2013-10-26 – 2013-10-27 (×2): 500 mg via ORAL
  Filled 2013-10-26 (×2): qty 1

## 2013-10-26 MED ORDER — TRANEXAMIC ACID 100 MG/ML IV SOLN
1000.0000 mg | INTRAVENOUS | Status: AC
  Administered 2013-10-26: 1000 mg via INTRAVENOUS
  Filled 2013-10-26: qty 10

## 2013-10-26 MED ORDER — METOCLOPRAMIDE HCL 5 MG/ML IJ SOLN: 5.0000 mg | Freq: Three times a day (TID) | INTRAMUSCULAR | Status: DC | PRN

## 2013-10-26 MED ORDER — SODIUM CHLORIDE 0.9 % IJ SOLN
INTRAMUSCULAR | Status: DC | PRN
  Administered 2013-10-26: 30 mL

## 2013-10-26 MED ORDER — PHENOL 1.4 % MT LIQD: 1.0000 | OROMUCOSAL | Status: DC | PRN

## 2013-10-26 MED ORDER — DOCUSATE SODIUM 100 MG PO CAPS
100.0000 mg | ORAL_CAPSULE | Freq: Two times a day (BID) | ORAL | Status: DC
  Administered 2013-10-26 – 2013-10-28 (×5): 100 mg via ORAL

## 2013-10-26 MED ORDER — MIDAZOLAM HCL 5 MG/5ML IJ SOLN
INTRAMUSCULAR | Status: DC | PRN
  Administered 2013-10-26 (×2): 1 mg via INTRAVENOUS

## 2013-10-26 MED ORDER — ONDANSETRON HCL 4 MG/2ML IJ SOLN: 4.0000 mg | Freq: Four times a day (QID) | INTRAMUSCULAR | Status: DC | PRN

## 2013-10-26 MED ORDER — LOSARTAN POTASSIUM-HCTZ 100-25 MG PO TABS: 1.0000 | ORAL_TABLET | Freq: Every morning | ORAL | Status: DC

## 2013-10-26 MED ORDER — SODIUM CHLORIDE 0.9 % IV SOLN: INTRAVENOUS | Status: DC

## 2013-10-26 MED ORDER — DEXAMETHASONE SODIUM PHOSPHATE 10 MG/ML IJ SOLN
10.0000 mg | Freq: Once | INTRAMUSCULAR | Status: AC
  Administered 2013-10-26: 10 mg via INTRAVENOUS

## 2013-10-26 MED ORDER — KETOROLAC TROMETHAMINE 15 MG/ML IJ SOLN
INTRAMUSCULAR | Status: AC
  Filled 2013-10-26: qty 1

## 2013-10-26 MED ORDER — LACTATED RINGERS IV SOLN
INTRAVENOUS | Status: DC | PRN
  Administered 2013-10-26 (×2): via INTRAVENOUS

## 2013-10-26 MED ORDER — MIDAZOLAM HCL 2 MG/2ML IJ SOLN
INTRAMUSCULAR | Status: AC
  Filled 2013-10-26: qty 2

## 2013-10-26 MED ORDER — OXYCODONE HCL 5 MG PO TABS
5.0000 mg | ORAL_TABLET | ORAL | Status: DC | PRN
  Administered 2013-10-26 – 2013-10-28 (×8): 10 mg via ORAL
  Filled 2013-10-26 (×8): qty 2

## 2013-10-26 MED ORDER — POLYETHYLENE GLYCOL 3350 17 G PO PACK
17.0000 g | PACK | Freq: Every day | ORAL | Status: DC | PRN
Start: 2013-10-26 — End: 2013-10-28

## 2013-10-26 MED ORDER — CEFAZOLIN SODIUM-DEXTROSE 2-3 GM-% IV SOLR
2.0000 g | Freq: Four times a day (QID) | INTRAVENOUS | Status: AC
  Administered 2013-10-26 (×2): 2 g via INTRAVENOUS
  Filled 2013-10-26 (×2): qty 50

## 2013-10-26 MED ORDER — ONDANSETRON HCL 4 MG/2ML IJ SOLN
INTRAMUSCULAR | Status: AC
  Filled 2013-10-26: qty 2

## 2013-10-26 MED ORDER — ONDANSETRON HCL 4 MG/2ML IJ SOLN
INTRAMUSCULAR | Status: DC | PRN
  Administered 2013-10-26: 4 mg via INTRAVENOUS

## 2013-10-26 MED ORDER — TRAMADOL HCL 50 MG PO TABS: 50.0000 mg | ORAL_TABLET | Freq: Four times a day (QID) | ORAL | Status: DC | PRN

## 2013-10-26 MED ORDER — ACETAMINOPHEN 10 MG/ML IV SOLN
1000.0000 mg | Freq: Once | INTRAVENOUS | Status: AC
  Administered 2013-10-26: 1000 mg via INTRAVENOUS
  Filled 2013-10-26: qty 100

## 2013-10-26 MED ORDER — MENTHOL 3 MG MT LOZG: 1.0000 | LOZENGE | OROMUCOSAL | Status: DC | PRN

## 2013-10-26 MED ORDER — CHLORHEXIDINE GLUCONATE 4 % EX LIQD: 60.0000 mL | Freq: Once | CUTANEOUS | Status: DC

## 2013-10-26 MED ORDER — ATENOLOL 50 MG PO TABS
50.0000 mg | ORAL_TABLET | Freq: Every morning | ORAL | Status: DC
  Administered 2013-10-27 – 2013-10-28 (×2): 50 mg via ORAL
  Filled 2013-10-26 (×2): qty 1

## 2013-10-26 MED ORDER — SODIUM CHLORIDE 0.9 % IR SOLN
Status: DC | PRN
  Administered 2013-10-26: 1000 mL

## 2013-10-26 MED ORDER — DEXAMETHASONE SODIUM PHOSPHATE 10 MG/ML IJ SOLN
10.0000 mg | Freq: Every day | INTRAMUSCULAR | Status: AC
  Filled 2013-10-26: qty 1

## 2013-10-26 MED ORDER — HYDRALAZINE HCL 20 MG/ML IJ SOLN
INTRAMUSCULAR | Status: AC
Start: 2013-10-26 — End: 2013-10-26
  Filled 2013-10-26: qty 1

## 2013-10-26 MED ORDER — PANTOPRAZOLE SODIUM 40 MG PO TBEC
80.0000 mg | DELAYED_RELEASE_TABLET | Freq: Every day | ORAL | Status: DC
  Administered 2013-10-27: 80 mg via ORAL
  Filled 2013-10-26: qty 2

## 2013-10-26 MED ORDER — POTASSIUM CHLORIDE IN NACL 20-0.9 MEQ/L-% IV SOLN
INTRAVENOUS | Status: DC
  Administered 2013-10-26 – 2013-10-27 (×3): via INTRAVENOUS
  Filled 2013-10-26 (×4): qty 1000

## 2013-10-26 MED ORDER — DEXAMETHASONE 6 MG PO TABS
10.0000 mg | ORAL_TABLET | Freq: Every day | ORAL | Status: AC
  Administered 2013-10-27: 10 mg via ORAL
  Filled 2013-10-26: qty 1

## 2013-10-26 MED ORDER — EPHEDRINE SULFATE 50 MG/ML IJ SOLN
INTRAMUSCULAR | Status: AC
  Filled 2013-10-26: qty 1

## 2013-10-26 MED ORDER — BUPIVACAINE LIPOSOME 1.3 % IJ SUSP
INTRAMUSCULAR | Status: DC | PRN
  Administered 2013-10-26: 20 mL

## 2013-10-26 MED ORDER — HYDRALAZINE HCL 20 MG/ML IJ SOLN
5.0000 mg | Freq: Once | INTRAMUSCULAR | Status: AC
  Administered 2013-10-26: 5 mg via INTRAVENOUS

## 2013-10-26 MED ORDER — PROMETHAZINE HCL 25 MG/ML IJ SOLN: 6.2500 mg | INTRAMUSCULAR | Status: DC | PRN

## 2013-10-26 MED ORDER — KETOROLAC TROMETHAMINE 15 MG/ML IJ SOLN
7.5000 mg | Freq: Four times a day (QID) | INTRAMUSCULAR | Status: AC | PRN
  Administered 2013-10-26: 7.5 mg via INTRAVENOUS

## 2013-10-26 MED ORDER — ATENOLOL 50 MG PO TABS
50.0000 mg | ORAL_TABLET | Freq: Once | ORAL | Status: AC
  Administered 2013-10-26: 50 mg via ORAL
  Filled 2013-10-26: qty 1

## 2013-10-26 MED ORDER — 0.9 % SODIUM CHLORIDE (POUR BTL) OPTIME
TOPICAL | Status: DC | PRN
  Administered 2013-10-26: 1000 mL

## 2013-10-26 MED ORDER — HYDRALAZINE HCL 20 MG/ML IJ SOLN
5.0000 mg | Freq: Once | INTRAMUSCULAR | Status: AC
Start: 2013-10-26 — End: 2013-10-26
  Administered 2013-10-26: 5 mg via INTRAVENOUS

## 2013-10-26 MED ORDER — BUPIVACAINE LIPOSOME 1.3 % IJ SUSP
20.0000 mL | Freq: Once | INTRAMUSCULAR | Status: DC
  Filled 2013-10-26: qty 20

## 2013-10-26 MED ORDER — ONDANSETRON HCL 4 MG PO TABS: 4.0000 mg | ORAL_TABLET | Freq: Four times a day (QID) | ORAL | Status: DC | PRN

## 2013-10-26 MED ORDER — RIVAROXABAN 10 MG PO TABS
10.0000 mg | ORAL_TABLET | Freq: Every day | ORAL | Status: DC
  Administered 2013-10-27: 10 mg via ORAL
  Filled 2013-10-26 (×2): qty 1

## 2013-10-26 MED ORDER — OXYCODONE HCL 5 MG/5ML PO SOLN: 5.0000 mg | Freq: Once | ORAL | Status: DC | PRN

## 2013-10-26 MED ORDER — PROPOFOL INFUSION 10 MG/ML OPTIME
INTRAVENOUS | Status: DC | PRN
  Administered 2013-10-26: 100 ug/kg/min via INTRAVENOUS

## 2013-10-26 MED ORDER — LORATADINE 10 MG PO TABS
10.0000 mg | ORAL_TABLET | Freq: Every day | ORAL | Status: DC
  Administered 2013-10-26 – 2013-10-28 (×3): 10 mg via ORAL
  Filled 2013-10-26 (×3): qty 1

## 2013-10-26 MED ORDER — BISACODYL 10 MG RE SUPP: 10.0000 mg | Freq: Every day | RECTAL | Status: DC | PRN

## 2013-10-26 MED ORDER — SODIUM CHLORIDE 0.9 % IJ SOLN
INTRAMUSCULAR | Status: AC
  Filled 2013-10-26: qty 10

## 2013-10-26 MED ORDER — SODIUM CHLORIDE 0.9 % IJ SOLN
INTRAMUSCULAR | Status: AC
  Filled 2013-10-26: qty 50

## 2013-10-26 MED ORDER — CEFAZOLIN SODIUM-DEXTROSE 2-3 GM-% IV SOLR
2.0000 g | INTRAVENOUS | Status: AC
  Administered 2013-10-26: 2 g via INTRAVENOUS

## 2013-10-26 MED ORDER — FLEET ENEMA 7-19 GM/118ML RE ENEM: 1.0000 | ENEMA | Freq: Once | RECTAL | Status: AC | PRN

## 2013-10-26 MED ORDER — MORPHINE SULFATE 2 MG/ML IJ SOLN
1.0000 mg | INTRAMUSCULAR | Status: DC | PRN
  Administered 2013-10-26: 1 mg via INTRAVENOUS
  Filled 2013-10-26: qty 1

## 2013-10-26 MED ORDER — HYDROCHLOROTHIAZIDE 25 MG PO TABS
25.0000 mg | ORAL_TABLET | Freq: Every day | ORAL | Status: DC
  Administered 2013-10-27 – 2013-10-28 (×2): 25 mg via ORAL
  Filled 2013-10-26 (×3): qty 1

## 2013-10-26 MED ORDER — ACETAMINOPHEN 650 MG RE SUPP: 650.0000 mg | Freq: Four times a day (QID) | RECTAL | Status: DC | PRN

## 2013-10-26 MED ORDER — DIPHENHYDRAMINE HCL 12.5 MG/5ML PO ELIX: 12.5000 mg | ORAL_SOLUTION | ORAL | Status: DC | PRN

## 2013-10-26 MED ORDER — OXYCODONE HCL 5 MG PO TABS: 5.0000 mg | ORAL_TABLET | Freq: Once | ORAL | Status: DC | PRN

## 2013-10-26 SURGICAL SUPPLY — 62 items
BAG ZIPLOCK 12X15 (MISCELLANEOUS) ×3 IMPLANT
BANDAGE ELASTIC 6 VELCRO ST LF (GAUZE/BANDAGES/DRESSINGS) ×3 IMPLANT
BANDAGE ESMARK 6X9 LF (GAUZE/BANDAGES/DRESSINGS) ×1 IMPLANT
BLADE SAG 18X100X1.27 (BLADE) ×3 IMPLANT
BLADE SAW SGTL 11.0X1.19X90.0M (BLADE) ×3 IMPLANT
BNDG ESMARK 6X9 LF (GAUZE/BANDAGES/DRESSINGS) ×3
BOWL SMART MIX CTS (DISPOSABLE) ×6 IMPLANT
CAP UPCHARGE REVISION TRAY ×3 IMPLANT
CAPT RP KNEE ×3 IMPLANT
CEMENT HV SMART SET (Cement) ×6 IMPLANT
CLOSURE WOUND 1/2 X4 (GAUZE/BANDAGES/DRESSINGS) ×1
CUFF TOURN SGL QUICK 34 (TOURNIQUET CUFF) ×2
CUFF TRNQT CYL 34X4X40X1 (TOURNIQUET CUFF) ×1 IMPLANT
DECANTER SPIKE VIAL GLASS SM (MISCELLANEOUS) ×3 IMPLANT
DRAPE EXTREMITY T 121X128X90 (DRAPE) ×3 IMPLANT
DRAPE POUCH INSTRU U-SHP 10X18 (DRAPES) ×3 IMPLANT
DRAPE U-SHAPE 47X51 STRL (DRAPES) ×3 IMPLANT
DRSG ADAPTIC 3X8 NADH LF (GAUZE/BANDAGES/DRESSINGS) ×3 IMPLANT
DRSG PAD ABDOMINAL 8X10 ST (GAUZE/BANDAGES/DRESSINGS) ×3 IMPLANT
DURAPREP 26ML APPLICATOR (WOUND CARE) ×3 IMPLANT
ELECT REM PT RETURN 9FT ADLT (ELECTROSURGICAL) ×3
ELECTRODE REM PT RTRN 9FT ADLT (ELECTROSURGICAL) ×1 IMPLANT
EVACUATOR 1/8 PVC DRAIN (DRAIN) ×3 IMPLANT
FACESHIELD WRAPAROUND (MASK) ×15 IMPLANT
GAUZE SPONGE 4X4 12PLY STRL (GAUZE/BANDAGES/DRESSINGS) ×3 IMPLANT
GLOVE BIO SURGEON STRL SZ7.5 (GLOVE) IMPLANT
GLOVE BIO SURGEON STRL SZ8 (GLOVE) ×3 IMPLANT
GLOVE BIOGEL PI IND STRL 6.5 (GLOVE) IMPLANT
GLOVE BIOGEL PI IND STRL 8 (GLOVE) ×1 IMPLANT
GLOVE BIOGEL PI INDICATOR 6.5 (GLOVE)
GLOVE BIOGEL PI INDICATOR 8 (GLOVE) ×2
GLOVE SURG SS PI 6.5 STRL IVOR (GLOVE) IMPLANT
GOWN STRL REUS W/TWL LRG LVL3 (GOWN DISPOSABLE) ×3 IMPLANT
GOWN STRL REUS W/TWL XL LVL3 (GOWN DISPOSABLE) IMPLANT
HANDPIECE INTERPULSE COAX TIP (DISPOSABLE) ×2
IMMOBILIZER KNEE 20 (SOFTGOODS) ×6 IMPLANT
IMMOBILIZER KNEE 20 THIGH 36 (SOFTGOODS) ×1 IMPLANT
KIT BASIN OR (CUSTOM PROCEDURE TRAY) ×3 IMPLANT
MANIFOLD NEPTUNE II (INSTRUMENTS) ×3 IMPLANT
NDL SAFETY ECLIPSE 18X1.5 (NEEDLE) ×2 IMPLANT
NEEDLE HYPO 18GX1.5 SHARP (NEEDLE) ×4
NS IRRIG 1000ML POUR BTL (IV SOLUTION) ×3 IMPLANT
PACK TOTAL JOINT (CUSTOM PROCEDURE TRAY) ×3 IMPLANT
PAD ABD 8X10 STRL (GAUZE/BANDAGES/DRESSINGS) ×3 IMPLANT
PADDING CAST COTTON 6X4 STRL (CAST SUPPLIES) ×3 IMPLANT
POSITIONER SURGICAL ARM (MISCELLANEOUS) ×3 IMPLANT
SET HNDPC FAN SPRY TIP SCT (DISPOSABLE) ×1 IMPLANT
STRIP CLOSURE SKIN 1/2X4 (GAUZE/BANDAGES/DRESSINGS) ×2 IMPLANT
SUCTION FRAZIER 12FR DISP (SUCTIONS) ×3 IMPLANT
SUT MNCRL AB 4-0 PS2 18 (SUTURE) ×3 IMPLANT
SUT VIC AB 2-0 CT1 27 (SUTURE) ×6
SUT VIC AB 2-0 CT1 TAPERPNT 27 (SUTURE) ×3 IMPLANT
SUT VLOC 180 0 24IN GS25 (SUTURE) ×3 IMPLANT
SYR 20CC LL (SYRINGE) ×3 IMPLANT
SYR 50ML LL SCALE MARK (SYRINGE) ×3 IMPLANT
TOWEL OR 17X26 10 PK STRL BLUE (TOWEL DISPOSABLE) ×3 IMPLANT
TOWEL OR NON WOVEN STRL DISP B (DISPOSABLE) IMPLANT
TRAY FOLEY CATH 14FRSI W/METER (CATHETERS) IMPLANT
TRAY FOLEY CATH 16FRSI W/METER (SET/KITS/TRAYS/PACK) ×3 IMPLANT
WATER STERILE IRR 1500ML POUR (IV SOLUTION) ×3 IMPLANT
WEDGE SZ 5 5MM (Knees) ×3 IMPLANT
WRAP KNEE MAXI GEL POST OP (GAUZE/BANDAGES/DRESSINGS) ×3 IMPLANT

## 2013-10-26 NOTE — Anesthesia Postprocedure Evaluation (Signed)
  Anesthesia Post-op Note  Patient: Eric Richmond  Procedure(s) Performed: Procedure(s): LEFT TOTAL KNEE ARTHROPLASTY (Left)  Patient Location: PACU  Anesthesia Type:Spinal  Level of Consciousness: awake, alert  and oriented  Airway and Oxygen Therapy: Patient Spontanous Breathing  Post-op Pain: mild  Post-op Assessment: Post-op Vital signs reviewed  Post-op Vital Signs: Reviewed  Last Vitals:  Filed Vitals:   10/26/13 1142  BP: 161/68  Pulse: 50  Temp: 36.4 C  Resp: 12    Complications: No apparent anesthesia complications

## 2013-10-26 NOTE — Interval H&P Note (Signed)
History and Physical Interval Note:  10/26/2013 7:14 AM  Eric Richmond  has presented today for surgery, with the diagnosis of OSTEOARTHRITIS LEFT KNEE  The various methods of treatment have been discussed with the patient and family. After consideration of risks, benefits and other options for treatment, the patient has consented to  Procedure(s): LEFT TOTAL KNEE ARTHROPLASTY (Left) as a surgical intervention .  The patient's history has been reviewed, patient examined, no change in status, stable for surgery.  I have reviewed the patient's chart and labs.  Questions were answered to the patient's satisfaction.     Gearlean Alf

## 2013-10-26 NOTE — Progress Notes (Signed)
Wife has C-PAP tubing in pt belongings bag

## 2013-10-26 NOTE — Anesthesia Procedure Notes (Signed)
Spinal  Patient location during procedure: OR Start time: 10/26/2013 8:35 AM End time: 10/26/2013 8:41 AM Staffing Anesthesiologist: Suzette Battiest E Preanesthetic Checklist Completed: patient identified, site marked, surgical consent, pre-op evaluation, timeout performed, IV checked, risks and benefits discussed and monitors and equipment checked Spinal Block Patient position: sitting Prep: Betadine Patient monitoring: heart rate, cardiac monitor, continuous pulse ox and blood pressure Approach: midline Location: L3-4 Injection technique: single-shot Needle Needle type: Spinocan  Needle gauge: 22 G Needle length: 9 cm Additional Notes Unable to obtain CSF on multiple passes at L4-5 interspace. L3-4 space attempted with return of CSF. Free flowing CSF before and after injection LA. Total 2cc's 0.75% bupivicaine injected intrathecally.

## 2013-10-26 NOTE — Evaluation (Signed)
Physical Therapy Evaluation Patient Details Name: Eric Richmond MRN: [id number] DOB: 11-02-1940 Today's Date: 10/26/2013   History of Present Illness  Pt is a 73 year old male s/p L TKA.  Clinical Impression  Pt is s/p L TKA resulting in the deficits listed below (see PT Problem List).  Pt will benefit from skilled PT to increase their independence and safety with mobility to allow discharge to the venue listed below.   Pt ambulating in hallway with assist to steady POD 0 and plans to d/c home with spouse.     Follow Up Recommendations Home health PT    Equipment Recommendations  Rolling walker with 5" wheels    Recommendations for Other Services       Precautions / Restrictions Precautions Precautions: Knee Required Braces or Orthoses: Knee Immobilizer - Left Knee Immobilizer - Left: Discontinue once straight leg raise with < 10 degree lag Restrictions Other Position/Activity Restrictions: WBAT      Mobility  Bed Mobility Overal bed mobility: Needs Assistance Bed Mobility: Supine to Sit     Supine to sit: Min assist     General bed mobility comments: assist for L LE  Transfers Overall transfer level: Needs assistance Equipment used: Rolling walker (2 wheeled) Transfers: Sit to/from Stand Sit to Stand: Mod assist;From elevated surface         General transfer comment: verbal cues for technique, assist to steady as pt had hands on RW to stand prior to finishing verbal cues however better following verbal cues upon sitting in recliner  Ambulation/Gait Ambulation/Gait assistance: Min assist Ambulation Distance (Feet): 80 Feet Assistive device: Rolling walker (2 wheeled) Gait Pattern/deviations: Antalgic;Step-through pattern;Decreased step length - left     General Gait Details: verbal cues for sequence, RW positioning, posture, assist to steady  Stairs            Wheelchair Mobility    Modified Rankin (Stroke Patients Only)       Balance                                              Pertinent Vitals/Pain Pain Assessment: 0-10 Pain Score: 2  Pain Location: L knee Pain Descriptors / Indicators: Aching;Sore Pain Intervention(s): Limited activity within patient's tolerance;Monitored during session;Repositioned;Ice applied    Home Living Family/patient expects to be discharged to:: Private residence Living Arrangements: Spouse/significant other   Type of Home: House Home Access: Stairs to enter Entrance Stairs-Rails: None Technical brewer of Steps: 3 Home Layout: One level Home Equipment: None      Prior Function Level of Independence: Independent               Hand Dominance        Extremity/Trunk Assessment               Lower Extremity Assessment: LLE deficits/detail   LLE Deficits / Details: good quad contraction, able to perform SLR, observed at least 75* knee flexion upon sitting EOB     Communication   Communication: HOH  Cognition Arousal/Alertness: Awake/alert Behavior During Therapy: WFL for tasks assessed/performed Overall Cognitive Status: Within Functional Limits for tasks assessed                      General Comments      Exercises        Assessment/Plan    PT  Assessment Patient needs continued PT services  PT Diagnosis Difficulty walking;Acute pain   PT Problem List Decreased strength;Decreased range of motion;Decreased knowledge of use of DME;Decreased balance;Decreased mobility;Decreased knowledge of precautions;Pain  PT Treatment Interventions Functional mobility training;Stair training;Gait training;DME instruction;Patient/family education;Therapeutic activities;Therapeutic exercise   PT Goals (Current goals can be found in the Care Plan section) Acute Rehab PT Goals PT Goal Formulation: With patient Time For Goal Achievement: 10/30/13 Potential to Achieve Goals: Good    Frequency 7X/week   Barriers to discharge         Co-evaluation               End of Session   Activity Tolerance: Patient tolerated treatment well Patient left: in chair;with call bell/phone within reach;with nursing/sitter in room Nurse Communication: Mobility status         Time: 6433-2951 PT Time Calculation (min): 14 min   Charges:   PT Evaluation $Initial PT Evaluation Tier I: 1 Procedure PT Treatments $Gait Training: 8-22 mins   PT G Codes:          Ghalia Reicks,[name] 10/26/2013, 3:59 PM [name], PT, DPT 10/26/2013 Pager: [phone]

## 2013-10-26 NOTE — Transfer of Care (Signed)
Immediate Anesthesia Transfer of Care Note  Patient: Eric Richmond  Procedure(s) Performed: Procedure(s): LEFT TOTAL KNEE ARTHROPLASTY (Left)  Patient Location: PACU  Anesthesia Type:Spinal  Level of Consciousness: awake, alert  and oriented  Airway & Oxygen Therapy: Patient Spontanous Breathing and Patient connected to face mask oxygen  Post-op Assessment: Report given to PACU RN and Post -op Vital signs reviewed and stable  Post vital signs: Reviewed and stable  Complications: No apparent anesthesia complications

## 2013-10-26 NOTE — Discharge Instructions (Addendum)
° °Dr. Frank Aluisio °Total Joint Specialist °[facility] °[address] °[place], [place] [address] °[phone] ° °TOTAL KNEE REPLACEMENT POSTOPERATIVE DIRECTIONS ° ° ° °Knee Rehabilitation, Guidelines Following Surgery  °Results after knee surgery are often greatly improved when you follow the exercise, range of motion and muscle strengthening exercises prescribed by your doctor. Safety measures are also important to protect the knee from further injury. Any time any of these exercises cause you to have increased pain or swelling in your knee joint, decrease the amount until you are comfortable again and slowly increase them. If you have problems or questions, call your caregiver or physical therapist for advice.  ° °HOME CARE INSTRUCTIONS  °Remove items at home which could result in a fall. This includes throw rugs or furniture in walking pathways.  °Continue medications as instructed at time of discharge. °You may have some home medications which will be placed on hold until you complete the course of blood thinner medication.  °You may start showering once you are discharged home but do not submerge the incision under water. Just pat the incision dry and apply a dry gauze dressing on daily. °Walk with walker as instructed.  °You may resume a sexual relationship in one month or when given the OK by  your doctor.  °· Use walker as long as suggested by your caregivers. °· Avoid periods of inactivity such as sitting longer than an hour when not asleep. This helps prevent blood clots.  °You may put full weight on your legs and walk as much as is comfortable.  °You may return to work once you are cleared by your doctor.  °Do not drive a car for 6 weeks or until released by you surgeon.  °· Do not drive while taking narcotics.  °Wear the elastic stockings for three weeks following surgery during the day but you may remove then at night. °Make sure you keep all of your appointments after your  operation with all of your doctors and caregivers. You should call the office at the above phone number and make an appointment for approximately two weeks after the date of your surgery. °Change the dressing daily and reapply a dry dressing each time. °Please pick up a stool softener and laxative for home use as long as you are requiring pain medications. °· Continue to use ice on the knee for pain and swelling from surgery. You may notice swelling that will progress down to the foot and ankle.  This is normal after surgery.  Elevate the leg when you are not up walking on it.   °It is important for you to complete the blood thinner medication as prescribed by your doctor. °· Continue to use the breathing machine which will help keep your temperature down.  It is common for your temperature to cycle up and down following surgery, especially at night when you are not up moving around and exerting yourself.  The breathing machine keeps your lungs expanded and your temperature down. ° °RANGE OF MOTION AND STRENGTHENING EXERCISES  °Rehabilitation of the knee is important following a knee injury or an operation. After just a few days of immobilization, the muscles of the thigh which control the knee become weakened and shrink (atrophy). Knee exercises are designed to build up the tone and strength of the thigh muscles and to improve knee motion. Often times heat used for twenty to thirty minutes before working out will loosen up your tissues and help with improving the   range of motion but do not use heat for the first two weeks following surgery. These exercises can be done on a training (exercise) mat, on the floor, on a table or on a bed. Use what ever works the best and is most comfortable for you Knee exercises include:  Leg Lifts - While your knee is still immobilized in a splint or cast, you can do straight leg raises. Lift the leg to 60 degrees, hold for 3 sec, and slowly lower the leg. Repeat 10-20 times 2-3  times daily. Perform this exercise against resistance later as your knee gets better.  Quad and Hamstring Sets - Tighten up the muscle on the front of the thigh (Quad) and hold for 5-10 sec. Repeat this 10-20 times hourly. Hamstring sets are done by pushing the foot backward against an object and holding for 5-10 sec. Repeat as with quad sets.  A rehabilitation program following serious knee injuries can speed recovery and prevent re-injury in the future due to weakened muscles. Contact your doctor or a physical therapist for more information on knee rehabilitation.   SKILLED REHAB INSTRUCTIONS: If the patient is transferred to a skilled rehab facility following release from the hospital, a list of the current medications will be sent to the facility for the patient to continue.  When discharged from the skilled rehab facility, please have the facility set up the patient's [place] prior to being released. Also, the skilled facility will be responsible for providing the patient with their medications at time of release from the facility to include their pain medication, the muscle relaxants, and their blood thinner medication. If the patient is still at the rehab facility at time of the two week follow up appointment, the skilled rehab facility will also need to assist the patient in arranging follow up appointment in our office and any transportation needs.  MAKE SURE YOU:  Understand these instructions.  Will watch your condition.  Will get help right away if you are not doing well or get worse.    Pick up stool softner and laxative for home. Do not submerge incision under water. May shower. Continue to use ice for pain and swelling from surgery.  Take Xarelto for two and a half more weeks, then discontinue Xarelto. Once the patient has completed the blood thinner regimen, then take a Baby 81 mg Aspirin daily for three more weeks.  Information on my medicine - XARELTO  (Rivaroxaban)  This medication education was reviewed with me or my healthcare representative as part of my discharge preparation.  The pharmacist that spoke with me during my hospital stay was:  Rudean Haskell, [facility]  Why was Xarelto prescribed for you? Xarelto was prescribed for you to reduce the risk of blood clots forming after orthopedic surgery. The medical term for these abnormal blood clots is venous thromboembolism (VTE).  What do you need to know about xarelto ? Take your Xarelto ONCE DAILY at the same time every day. You may take it either with or without food.  If you have difficulty swallowing the tablet whole, you may crush it and mix in applesauce just prior to taking your dose.  Take Xarelto exactly as prescribed by your doctor and DO NOT stop taking Xarelto without talking to the doctor who prescribed the medication.  Stopping without other VTE prevention medication to take the place of Xarelto may increase your risk of developing a clot.  After discharge, you should have regular check-up appointments  with your healthcare provider that is prescribing your Xarelto.    What do you do if you miss a dose? If you miss a dose, take it as soon as you remember on the same day then continue your regularly scheduled once daily regimen the next day. Do not take two doses of Xarelto on the same day.   Important Safety Information A possible side effect of Xarelto is bleeding. You should call your healthcare provider right away if you experience any of the following:   Bleeding from an injury or your nose that does not stop.   Unusual colored urine (red or dark brown) or unusual colored stools (red or black).   Unusual bruising for unknown reasons.   A serious fall or if you hit your head (even if there is no bleeding).  Some medicines may interact with Xarelto and might increase your risk of bleeding while on Xarelto. To help avoid this, consult your healthcare provider or  pharmacist prior to using any new prescription or non-prescription medications, including herbals, vitamins, non-steroidal anti-inflammatory drugs (NSAIDs) and supplements.  This website has more information on Xarelto: [email or web address]

## 2013-10-26 NOTE — Op Note (Signed)
Pre-operative diagnosis- Osteoarthritis Left  knee(s)  Post-operative diagnosis- Osteoarthritis Left knee(s)  Procedure-  Left  Total Knee Arthroplasty  Surgeon- Dione Plover. Malaisha Silliman, MD  Assistant- Arlee Muslim, PA-C   Anesthesia-  Spinal  EBL-* No blood loss amount entered *   Drains Hemovac  Tourniquet time-  Total Tourniquet Time Documented: Thigh (Left) - 45 minutes Total: Thigh (Left) - 45 minutes     Complications- None  Condition-PACU - hemodynamically stable.   Brief Clinical Note   Eric Richmond is a 73 y.o. year old male with end stage OA of his left knee with progressively worsening pain and dysfunction. He has constant pain, with activity and at rest and significant functional deficits with difficulties even with ADLs. He has had extensive non-op management including analgesics, injections of cortisone and viscosupplements, and home exercise program, but remains in significant pain with significant dysfunction. Radiographs show bone on bone arthritis all 3 compartments. He presents now for left Total Knee Arthroplasty.     Procedure in detail---   The patient is brought into the operating room and positioned supine on the operating table. After successful administration of  Spinal,   a tourniquet is placed high on the  Left thigh(s) and the lower extremity is prepped and draped in the usual sterile fashion. Time out is performed by the operating team and then the  Left lower extremity is wrapped in Esmarch, knee flexed and the tourniquet inflated to 300 mmHg.       A midline incision is made with a ten blade through the subcutaneous tissue to the level of the extensor mechanism. A fresh blade is used to make a medial parapatellar arthrotomy. Soft tissue over the proximal medial tibia is subperiosteally elevated to the joint line with a knife and into the semimembranosus bursa with a Cobb elevator. Soft tissue over the proximal lateral tibia is elevated with attention being  paid to avoiding the patellar tendon on the tibial tubercle. The patella is everted, knee flexed 90 degrees and the ACL and PCL are removed. Findings are bone on bone all 3 compartments with large medial tibial defect        The drill is used to create a starting hole in the distal femur and the canal is thoroughly irrigated with sterile saline to remove the fatty contents. The 5 degree Left  valgus alignment guide is placed into the femoral canal and the distal femoral cutting block is pinned to remove 10 mm off the distal femur. Resection is made with an oscillating saw.      The tibia is subluxed forward and the menisci are removed. The extramedullary alignment guide is placed referencing proximally at the medial aspect of the tibial tubercle and distally along the second metatarsal axis and tibial crest. The block is pinned to remove 8 mm off the lateral side. The medial side has al large deficiency thus I decided to use an MBT revision tray with an augment and resected a few mm off more deficient medial side to allow for a 71mm augment. Resection is made with an oscillating saw. Size 5is the most appropriate size for the tibia and the proximal tibia is prepared with the modular drill and keel punch for that size.      The femoral sizing guide is placed and size 5 is most appropriate. Rotation is marked off the epicondylar axis and confirmed by creating a rectangular flexion gap at 90 degrees. The size 5 cutting block is pinned in  this rotation and the anterior, posterior and chamfer cuts are made with the oscillating saw. The intercondylar block is then placed and that cut is made.      Trial size 5 MBT revision tibial component with a 34mm medial augment, trial size 5 posterior stabilized femur and a 15  mm posterior stabilized rotating platform insert trial is placed. Full extension is achieved with excellent varus/valgus and anterior/posterior balance throughout full range of motion. The patella is everted  and thickness measured to be 28  mm. Free hand resection is taken to 16 mm, a 41 template is placed, lug holes are drilled, trial patella is placed, and it tracks normally. Osteophytes are removed off the posterior femur with the trial in place. All trials are removed and the cut bone surfaces prepared with pulsatile lavage. Cement is mixed and once ready for implantation, the size 5 MBT revision tibial implant with a 5 mm medial augment, size  5 posterior stabilized femoral component, and the size 41 patella are cemented in place and the patella is held with the clamp. The trial insert is placed and the knee held in full extension. The Exparel (20 ml mixed with 30 ml saline) and .25% Bupivicaine, are injected into the extensor mechanism, posterior capsule, medial and lateral gutters and subcutaneous tissues.  All extruded cement is removed and once the cement is hard the permanent 15 mm posterior stabilized rotating platform insert is placed into the tibial tray.      The wound is copiously irrigated with saline solution and the extensor mechanism closed over a hemovac drain with #1 V-loc suture. The tourniquet is released for a total tourniquet time of 45  minutes. Flexion against gravity is 140 degrees and the patella tracks normally. Subcutaneous tissue is closed with 2.0 vicryl and subcuticular with running 4.0 Monocryl. The incision is cleaned and dried and steri-strips and a bulky sterile dressing are applied. The limb is placed into a knee immobilizer and the patient is awakened and transported to recovery in stable condition.      Please note that a surgical assistant was a medical necessity for this procedure in order to perform it in a safe and expeditious manner. Surgical assistant was necessary to retract the ligaments and vital neurovascular structures to prevent injury to them and also necessary for proper positioning of the limb to allow for anatomic placement of the prosthesis.   Dione Plover  Tai Skelly, MD    10/26/2013, 9:43 AM

## 2013-10-26 NOTE — Progress Notes (Signed)
CARE MANAGEMENT NOTE 10/26/2013  Patient:  Eric Richmond, Eric Richmond   Account Number:  [id number]  Date Initiated:  10/26/2013  Documentation initiated by:  Lanita Stammen  Subjective/Objective Assessment:   left total knee     Action/Plan:   home with hhc and dme   Anticipated DC Date:  10/28/2013   Anticipated DC Plan:  [place] referral  NA      DC Planning Services  CM consult      [facility] Choice  NA   Choice offered to / List presented to:  C-1 Patient           Status of service:  In process, will continue to follow Medicare Important Message given?  NA - LOS <3 / Initial given by admissions (If response is "NO", the following Medicare IM given date fields will be blank) Date Medicare IM given:   Medicare IM given by:   Date Additional Medicare IM given:   Additional Medicare IM given by:    Discharge Disposition:    Per UR Regulation:  Reviewed for med. necessity/level of care/duration of stay  If discussed at [place] of Stay Meetings, dates discussed:    Comments:  Suanne Marker Teisha Trowbridge,RN,BSN,CCM

## 2013-10-26 NOTE — Anesthesia Preprocedure Evaluation (Addendum)
Anesthesia Evaluation  Patient identified by MRN, date of birth, ID band Patient awake    Reviewed: Allergy & Precautions, H&P , NPO status , Patient's Chart, lab work & pertinent test results  Airway Mallampati: III TM Distance: >3 FB Neck ROM: Full    Dental  (+) Teeth Intact, Dental Advisory Given   Pulmonary sleep apnea and Continuous Positive Airway Pressure Ventilation , former smoker,  breath sounds clear to auscultation        Cardiovascular hypertension, Rhythm:Regular Rate:Normal     Neuro/Psych negative neurological ROS  negative psych ROS   GI/Hepatic Neg liver ROS, GERD-  ,  Endo/Other  diabetes, Well Controlled, Type 2  Renal/GU negative Renal ROS  negative genitourinary   Musculoskeletal negative musculoskeletal ROS (+)   Abdominal   Peds  Hematology negative hematology ROS (+)   Anesthesia Other Findings   Reproductive/Obstetrics negative OB ROS                         Anesthesia Physical Anesthesia Plan  ASA: II  Anesthesia Plan: Spinal   Post-op Pain Management:    Induction: Intravenous  Airway Management Planned: Simple Face Mask  Additional Equipment:   Intra-op Plan:   Post-operative Plan:   Informed Consent: I have reviewed the patients History and Physical, chart, labs and discussed the procedure including the risks, benefits and alternatives for the proposed anesthesia with the patient or authorized representative who has indicated his/her understanding and acceptance.   Dental advisory given  Plan Discussed with: CRNA and Surgeon  Anesthesia Plan Comments:         Anesthesia Quick Evaluation

## 2013-10-27 LAB — CBC
HCT: 31.7 % — ABNORMAL LOW (ref 39.0–52.0)
Hemoglobin: 10.8 g/dL — ABNORMAL LOW (ref 13.0–17.0)
MCH: 28.9 pg (ref 26.0–34.0)
MCHC: 34.1 g/dL (ref 30.0–36.0)
MCV: 84.8 fL (ref 78.0–100.0)
PLATELETS: 176 10*3/uL (ref 150–400)
RBC: 3.74 MIL/uL — ABNORMAL LOW (ref 4.22–5.81)
RDW: 13.4 % (ref 11.5–15.5)
WBC: 11.4 10*3/uL — ABNORMAL HIGH (ref 4.0–10.5)

## 2013-10-27 LAB — BASIC METABOLIC PANEL
ANION GAP: 10 (ref 5–15)
BUN: 19 mg/dL (ref 6–23)
CALCIUM: 8.3 mg/dL — AB (ref 8.4–10.5)
CO2: 23 mEq/L (ref 19–32)
CREATININE: 0.87 mg/dL (ref 0.50–1.35)
Chloride: 102 mEq/L (ref 96–112)
GFR calc Af Amer: >90 mL/min (ref >90)
GFR, EST NON AFRICAN AMERICAN: 84 mL/min — AB (ref >90)
Glucose, Bld: 121 mg/dL — ABNORMAL HIGH (ref 70–99)
Potassium: 4.5 mEq/L (ref 3.7–5.3)
SODIUM: 135 meq/L — AB (ref 137–147)

## 2013-10-27 MED ORDER — OMEPRAZOLE 20 MG PO CPDR
40.0000 mg | DELAYED_RELEASE_CAPSULE | Freq: Every day | ORAL | Status: DC
  Administered 2013-10-27 – 2013-10-28 (×2): 40 mg via ORAL
  Filled 2013-10-27 (×2): qty 2

## 2013-10-27 MED ORDER — NON FORMULARY: 40.0000 mg | Freq: Every day | Status: DC

## 2013-10-27 NOTE — Progress Notes (Signed)
Physical Therapy Treatment Patient Details Name: Eric Richmond MRN: [id number] DOB: [birth year] Today's Date: November 09, 2013    History of Present Illness Pt is a 73 year old male s/p L TKA.    PT Comments    Pt ambulated in hallway and then performed LE exercises.  Pt provided with HEP handout.  Follow Up Recommendations  Home health PT     Equipment Recommendations  Rolling walker with 5" wheels    Recommendations for Other Services       Precautions / Restrictions Precautions Precautions: Knee Required Braces or Orthoses: Knee Immobilizer - Left Knee Immobilizer - Left: Discontinue once straight leg raise with < 10 degree lag Restrictions Other Position/Activity Restrictions: WBAT    Mobility  Bed Mobility               General bed mobility comments: pt up in recliner on arrival  Transfers Overall transfer level: Needs assistance Equipment used: Rolling walker (2 wheeled) Transfers: Sit to/from Stand Sit to Stand: Min guard         General transfer comment: verbal cues for UE and LE positioning  Ambulation/Gait Ambulation/Gait assistance: Min guard Ambulation Distance (Feet): 160 Feet Assistive device: Rolling walker (2 wheeled) Gait Pattern/deviations: Step-through pattern;Decreased stance time - left;Antalgic     General Gait Details: verbal cues for sequence, RW positioning, posture, improved steadiness today, used KI   Financial trader Rankin (Stroke Patients Only)       Balance                                    Cognition Arousal/Alertness: Awake/alert Behavior During Therapy: WFL for tasks assessed/performed Overall Cognitive Status: Within Functional Limits for tasks assessed                      Exercises Total Joint Exercises Ankle Circles/Pumps: Both;15 reps;AROM Quad Sets: AROM;Both;15 reps Towel Squeeze: AROM;Both;15 reps Short Arc QuadSinclair Ship;Left;15 reps Heel  Slides: AAROM;Left;15 reps;Seated Hip ABduction/ADduction: AROM;Left;15 reps Straight Leg Raises: AROM;Left;10 reps    General Comments        Pertinent Vitals/Pain Pain Assessment: 0-10 Pain Score: 5  Pain Location: L knee Pain Descriptors / Indicators: Aching;Sore Pain Intervention(s): Limited activity within patient's tolerance;Monitored during session;Ice applied    Home Living                      Prior Function            PT Goals (current goals can now be found in the care plan section) Progress towards PT goals: Progressing toward goals    Frequency  7X/week    PT Plan Current plan remains appropriate    Co-evaluation             End of Session Equipment Utilized During Treatment: Gait belt Activity Tolerance: Patient tolerated treatment well Patient left: in chair;with call bell/phone within reach     Time: 1122-1145 PT Time Calculation (min): 23 min  Charges:  $Gait Training: 8-22 mins $Therapeutic Exercise: 8-22 mins                    G Codes:      [name],[name] 2013/11/09, 12:32 PM [name], PT, DPT 09-Nov-2013 Pager: [phone]

## 2013-10-27 NOTE — Evaluation (Signed)
Occupational Therapy Evaluation Patient Details Name: Eric Richmond MRN: [id number] DOB: [birth year] Today's Date: 10/27/2013    History of Present Illness Pt is a 73 year old male s/p L TKA.   Clinical Impression   Pt was admitted for the above surgery. All education was completed.  No further OT is needed at this time.    Follow Up Recommendations  No OT follow up    Equipment Recommendations  3 in 1 bedside comode    Recommendations for Other Services       Precautions / Restrictions Precautions Precautions: Knee Required Braces or Orthoses: Knee Immobilizer - Left Knee Immobilizer - Left: Discontinue once straight leg raise with < 10 degree lag Restrictions Other Position/Activity Restrictions: WBAT      Mobility Bed Mobility   Bed Mobility: Supine to Sit     Supine to sit: Min assist     General bed mobility comments: assist for L LE  Transfers   Equipment used: Rolling walker (2 wheeled) Transfers: Sit to/from Stand Sit to Stand: Min assist         General transfer comment: vcs for hand and leg position    Balance                                            ADL Overall ADL's : Needs assistance/impaired     Grooming: Oral care;Supervision/safety;Standing                   Toilet Transfer: Ambulation;Min guard;RW;BSC   Toileting- Clothing Manipulation and Hygiene: Min guard;Sit to/from stand         General ADL Comments: Ambulated to bathroom with min guard and cues for sequence/step length.  He needs min A for LB adls and set up for UB adls.  Wife will assist pt at home.  Educated on sponge bathing vs. tub bench.  Pt will sponge bathe until he can safely step over tub.  Educated on tub readiness     Vision                     Perception     Praxis      Pertinent Vitals/Pain Pain Assessment: 0-10 Pain Score: 3  Pain Location: L knee Pain Descriptors / Indicators: Aching Pain Intervention(s):  Premedicated before session;Repositioned     Hand Dominance     Extremity/Trunk Assessment Upper Extremity Assessment Upper Extremity Assessment: Overall WFL for tasks assessed           Communication Communication Communication: HOH   Cognition Arousal/Alertness: Awake/alert Behavior During Therapy: WFL for tasks assessed/performed Overall Cognitive Status: Within Functional Limits for tasks assessed                     General Comments       Exercises       Shoulder Instructions      Home Living Family/patient expects to be discharged to:: Private residence Living Arrangements: Spouse/significant other                 Bathroom Shower/Tub: Tub/shower unit Shower/tub characteristics: Architectural technologist: Standard     Home Equipment: None          Prior Functioning/Environment Level of Independence: Independent             OT Diagnosis:  OT Problem List:     OT Treatment/Interventions:      OT Goals(Current goals can be found in the care plan section)    OT Frequency:     Barriers to D/C:            Co-evaluation              End of Session    Activity Tolerance: Patient tolerated treatment well Patient left: in chair;with call bell/phone within reach;with family/visitor present   Time: 6803-2122 OT Time Calculation (min): 24 min Charges:  OT General Charges $OT Visit: 1 Procedure OT Evaluation $Initial OT Evaluation Tier I: 1 Procedure OT Treatments $Self Care/Home Management : 8-22 mins G-Codes:    Eric Richmond 11/24/13, 9:18 AM [name], OTR/L [phone] 24-Nov-2013

## 2013-10-27 NOTE — Progress Notes (Signed)
[facility]  [facility] is providing the following services: RW and Commode  If patient discharges after hours, please call [phone].   Linward Headland 10/27/2013, 11:11 AM

## 2013-10-27 NOTE — Progress Notes (Signed)
Pt's hemovac was accidentally pulled out by pt as he was trying to get back to bed from the recliner as he was assisted by the NT. No further bleeding noted on incision site. Will continue to monitor.

## 2013-10-27 NOTE — Care Management Note (Signed)
    Page 1 of 2   10/27/2013     11:41:23 AM CARE MANAGEMENT NOTE 10/27/2013  Patient:  JEET, SHOUGH   Account Number:  [id number]  Date Initiated:  10/26/2013  Documentation initiated by:  DAVIS,RHONDA  Subjective/Objective Assessment:   left total knee     Action/Plan:   home with hhc and dme   Anticipated DC Date:  10/28/2013   Anticipated DC Plan:  [place]  In-house referral  NA      DC Planning Services  CM consult      [facility] Choice  NA   Choice offered to / List presented to:  C-1 Patient   DME arranged  3-N-1  Vassie Moselle      DME agency  [place] arranged  [place]   Status of service:  Completed, signed off Medicare Important Message given?  NA - LOS <3 / Initial given by admissions (If response is "NO", the following Medicare IM given date fields will be blank) Date Medicare IM given:   Medicare IM given by:   Date Additional Medicare IM given:   Additional Medicare IM given by:    Discharge Disposition:  [place]  Per UR Regulation:  Reviewed for med. necessity/level of care/duration of stay  If discussed at [place] of Stay Meetings, dates discussed:    Comments:  10/27/13 11:25 CM met with pt to offer choice.  Pt chooses [facility] to render HHPT.  Address and contact information verified with pt.  Orders and F2F in place.  Referral made to [place] (on unit).  CM texted [facility] DME rep, to deliver 3n1 and rolling walker to room prior to discharge.  No other CM needs were communicated. Mariane Masters, BSN, CM [phone].  Rhonda Davis,RN,BSN,CCM

## 2013-10-27 NOTE — Progress Notes (Signed)
Physical Therapy Treatment Note   10/27/13 1400  PT Visit Information  Last PT Received On 10/27/13  Assistance Needed +1  History of Present Illness Pt is a 73 year old male s/p L TKA.  PT Time Calculation  PT Start Time 1409  PT Stop Time 1420  PT Time Calculation (min) 11 min  Subjective Data  Subjective Pt ambulated in hallway and assisted back to bed.  Precautions  Precautions Knee  Required Braces or Orthoses Knee Immobilizer - Left  Knee Immobilizer - Left Discontinue once straight leg raise with < 10 degree lag  Restrictions  Other Position/Activity Restrictions WBAT  Pain Assessment  Pain Assessment 0-10  Pain Score 3  Pain Location L knee  Pain Descriptors / Indicators Aching;Sore  Pain Intervention(s) Repositioned;Monitored during session;Limited activity within patient's tolerance;Ice applied  Cognition  Arousal/Alertness Awake/alert  Behavior During Therapy WFL for tasks assessed/performed  Overall Cognitive Status Within Functional Limits for tasks assessed  Bed Mobility  Overal bed mobility Needs Assistance  Bed Mobility Sit to Supine  Sit to supine Min assist  General bed mobility comments assist for L LE onto bed  Transfers  Overall transfer level Needs assistance  Equipment used Rolling walker (2 wheeled)  Transfers Sit to/from Stand  Sit to Stand Min guard  General transfer comment verbal cues for UE and LE positioning and to wait for RW to be in front of pt prior to standing  Ambulation/Gait  Ambulation/Gait assistance Min guard  Ambulation Distance (Feet) 220 Feet  Assistive device Rolling walker (2 wheeled)  Gait Pattern/deviations Step-through pattern;Antalgic;Trunk flexed  General Gait Details verbal cues for step length, posture, RW positioning  PT - End of Session  Activity Tolerance Patient tolerated treatment well  Patient left in bed;with call bell/phone within reach  PT - Assessment/Plan  PT Plan Current plan remains appropriate  PT  Frequency 7X/week  Follow Up Recommendations Home health PT  PT equipment Rolling walker with 5" wheels  PT Goal Progression  Progress towards PT goals Progressing toward goals  PT General Charges  $$ ACUTE PT VISIT 1 Procedure  PT Treatments  $Gait Training 8-22 mins   [name], PT, DPT 10/27/2013 Pager: [phone]

## 2013-10-27 NOTE — Progress Notes (Signed)
RT placed patient on CPAP. Patient could not remember his home setting so RT placed patient on auto titrate. Min 5 cmH2O and max 18 cmH2O. 2 liters of oxygen bleed into patients home tubing and mask. Sterile water added to water chamber for humidification. Patient is tolerating well and RT will continue to monitor and assess as needed.

## 2013-10-27 NOTE — Progress Notes (Signed)
Arrived to pt bedside and found him asleep and already wearing a hospital-provided cpap with his nasal mask and tubing from home.  Per RN, he assisted pt with cpap.  Pt appears to be tolerating well at this time.  2l o2 bleedin.  Previous settings noted cpap on autotitration mode 5-18cm h2o.  Sterile water in humidification chamber still near max fill line.  RT will monitor and assess as needed.

## 2013-10-27 NOTE — Progress Notes (Signed)
   Subjective: 1 Day Post-Op Procedure(s) (LRB): LEFT TOTAL KNEE ARTHROPLASTY (Left) Patient reports pain as mild.   Patient seen in rounds with Dr. Wynelle Link.  He is doing well this morning. Patient is well, and has had no acute complaints or problems We will resume therapy today.  Plan is to go Home after hospital stay.  Objective: Vital signs in last 24 hours: Temp:  [97.5 F (36.4 C)-98.4 F (36.9 C)] 97.5 F (36.4 C) (09/01 0626) Pulse Rate:  [42-69] 53 (09/01 0626) Resp:  [11-18] 18 (09/01 0800) BP: (111-180)/(58-99) 141/82 mmHg (09/01 0626) SpO2:  [95 %-100 %] 97 % (09/01 0800)  Intake/Output from previous day:  Intake/Output Summary (Last 24 hours) at 10/27/13 0912 Last data filed at 10/27/13 0806  Gross per 24 hour  Intake   2320 ml  Output   2260 ml  Net     60 ml    Intake/Output this shift: Total I/O In: 240 [P.O.:240] Out: -   Labs:  Recent Labs  10/27/13 0440  HGB 10.8*    Recent Labs  10/27/13 0440  WBC 11.4*  RBC 3.74*  HCT 31.7*  PLT 176    Recent Labs  10/27/13 0440  NA 135*  K 4.5  CL 102  CO2 23  BUN 19  CREATININE 0.87  GLUCOSE 121*  CALCIUM 8.3*   No results found for this basename: LABPT, INR,  in the last 72 hours  EXAM General - Patient is Alert, Appropriate and Oriented Extremity - Neurovascular intact Sensation intact distally Dorsiflexion/Plantar flexion intact Dressing - dressing C/D/I Motor Function - intact, moving foot and toes well on exam.  Doing SLR's on exam Hemovac pulled without difficulty.  Past Medical History  Diagnosis Date  . Unspecified essential hypertension   . GERD (gastroesophageal reflux disease)   . ED (erectile dysfunction)   . Osteoarthritis   . Hard of hearing   . Anal fissure 20 years ago  . Allergy   . OSA (obstructive sleep apnea)     per Dr. Gwenette Greet , cpap -2.0    Assessment/Plan: 1 Day Post-Op Procedure(s) (LRB): LEFT TOTAL KNEE ARTHROPLASTY (Left) Principal Problem:  OA (osteoarthritis) of knee  Estimated body mass index is 32.39 kg/(m^2) as calculated from the following:   Height as of this encounter: 5\' 8"  (1.727 m).   Weight as of this encounter: 96.616 kg (213 lb). Advance diet Up with therapy Plan for discharge tomorrow Discharge home with home health  DVT Prophylaxis - Xarelto Weight-Bearing as tolerated to left leg D/C O2 and Pulse OX and try on Room Air  Arlee Muslim, PA-C Orthopaedic Surgery 10/27/2013, 9:12 AM

## 2013-10-28 LAB — BASIC METABOLIC PANEL
Anion gap: 11 (ref 5–15)
BUN: 14 mg/dL (ref 6–23)
CALCIUM: 8.6 mg/dL (ref 8.4–10.5)
CHLORIDE: 102 meq/L (ref 96–112)
CO2: 26 meq/L (ref 19–32)
Creatinine, Ser: 0.83 mg/dL (ref 0.50–1.35)
GFR calc Af Amer: >90 mL/min (ref >90)
GFR calc non Af Amer: 85 mL/min — ABNORMAL LOW (ref >90)
GLUCOSE: 141 mg/dL — AB (ref 70–99)
POTASSIUM: 4.2 meq/L (ref 3.7–5.3)
SODIUM: 139 meq/L (ref 137–147)

## 2013-10-28 LAB — CBC
HCT: 31.4 % — ABNORMAL LOW (ref 39.0–52.0)
HEMOGLOBIN: 10.7 g/dL — AB (ref 13.0–17.0)
MCH: 28.8 pg (ref 26.0–34.0)
MCHC: 34.1 g/dL (ref 30.0–36.0)
MCV: 84.6 fL (ref 78.0–100.0)
Platelets: 163 10*3/uL (ref 150–400)
RBC: 3.71 MIL/uL — AB (ref 4.22–5.81)
RDW: 13.4 % (ref 11.5–15.5)
WBC: 11.9 10*3/uL — AB (ref 4.0–10.5)

## 2013-10-28 MED ORDER — RIVAROXABAN 10 MG PO TABS: 10.0000 mg | ORAL_TABLET | Freq: Every day | ORAL | Status: DC

## 2013-10-28 MED ORDER — METHOCARBAMOL 500 MG PO TABS: 500.0000 mg | ORAL_TABLET | Freq: Four times a day (QID) | ORAL | Status: DC | PRN

## 2013-10-28 MED ORDER — TRAMADOL HCL 50 MG PO TABS: 50.0000 mg | ORAL_TABLET | Freq: Four times a day (QID) | ORAL | Status: DC | PRN

## 2013-10-28 MED ORDER — OXYCODONE HCL 5 MG PO TABS: 5.0000 mg | ORAL_TABLET | ORAL | Status: DC | PRN

## 2013-10-28 NOTE — Progress Notes (Signed)
   Subjective: 2 Days Post-Op Procedure(s) (LRB): LEFT TOTAL KNEE ARTHROPLASTY (Left) Patient reports pain as mild.   Patient seen in rounds with Dr. Wynelle Link. Patient is well, and has had no acute complaints or problems Patient is ready to go home  Objective: Vital signs in last 24 hours: Temp:  [98 F (36.7 C)-98.6 F (37 C)] 98.6 F (37 C) (09/02 0552) Pulse Rate:  [51-62] 51 (09/02 0552) Resp:  [14-20] 16 (09/02 0552) BP: (133-161)/(45-90) 133/78 mmHg (09/02 0552) SpO2:  [96 %-98 %] 96 % (09/02 0552)  Intake/Output from previous day:  Intake/Output Summary (Last 24 hours) at 10/28/13 0745 Last data filed at 10/28/13 0600  Gross per 24 hour  Intake    900 ml  Output   4080 ml  Net  -3180 ml    Intake/Output this shift:    Labs:  Recent Labs  10/27/13 0440 10/28/13 0443  HGB 10.8* 10.7*    Recent Labs  10/27/13 0440 10/28/13 0443  WBC 11.4* 11.9*  RBC 3.74* 3.71*  HCT 31.7* 31.4*  PLT 176 163    Recent Labs  10/27/13 0440 10/28/13 0443  NA 135* 139  K 4.5 4.2  CL 102 102  CO2 23 26  BUN 19 14  CREATININE 0.87 0.83  GLUCOSE 121* 141*  CALCIUM 8.3* 8.6   No results found for this basename: LABPT, INR,  in the last 72 hours  EXAM: General - Patient is Alert, Appropriate and Oriented Extremity - Neurovascular intact Sensation intact distally Incision - clean, dry, no drainage, healing Motor Function - intact, moving foot and toes well on exam.   Assessment/Plan: 2 Days Post-Op Procedure(s) (LRB): LEFT TOTAL KNEE ARTHROPLASTY (Left) Procedure(s) (LRB): LEFT TOTAL KNEE ARTHROPLASTY (Left) Past Medical History  Diagnosis Date  . Unspecified essential hypertension   . GERD (gastroesophageal reflux disease)   . ED (erectile dysfunction)   . Osteoarthritis   . Hard of hearing   . Anal fissure 20 years ago  . Allergy   . OSA (obstructive sleep apnea)     per Dr. Gwenette Greet , cpap -2.0   Principal Problem:   OA (osteoarthritis) of  knee  Estimated body mass index is 32.39 kg/(m^2) as calculated from the following:   Height as of this encounter: 5\' 8"  (1.727 m).   Weight as of this encounter: 96.616 kg (213 lb). Up with therapy Discharge home with home health Diet - Cardiac diet and Diabetic diet Follow up - in 2 weeks Activity - WBAT Disposition - Home Condition Upon Discharge - Good D/C Meds - See DC Summary DVT Prophylaxis - Xarelto  Arlee Muslim, PA-C Orthopaedic Surgery 10/28/2013, 7:45 AM

## 2013-10-28 NOTE — Discharge Summary (Signed)
Physician Discharge Summary   Patient ID: Eric Richmond MRN: [id number] DOB/AGE: Apr 28, 1940 73 y.o.  Admit date: 10/26/2013 Discharge date: 10/28/2013  Primary Diagnosis:  Osteoarthritis Left knee(s)  Admission Diagnoses:  Past Medical History  Diagnosis Date  . Unspecified essential hypertension   . GERD (gastroesophageal reflux disease)   . ED (erectile dysfunction)   . Osteoarthritis   . Hard of hearing   . Anal fissure 20 years ago  . Allergy   . OSA (obstructive sleep apnea)     per Dr. Gwenette Greet , cpap -2.0   Discharge Diagnoses:   Principal Problem:   OA (osteoarthritis) of knee  Estimated body mass index is 32.39 kg/(m^2) as calculated from the following:   Height as of this encounter: 5' 8"  (1.727 m).   Weight as of this encounter: 96.616 kg (213 lb).  Procedure:  Procedure(s) (LRB): LEFT TOTAL KNEE ARTHROPLASTY (Left)   Consults: None  HPI: Eric Richmond is a 73 y.o. year old male with end stage OA of his left knee with progressively worsening pain and dysfunction. He has constant pain, with activity and at rest and significant functional deficits with difficulties even with ADLs. He has had extensive non-op management including analgesics, injections of cortisone and viscosupplements, and home exercise program, but remains in significant pain with significant dysfunction. Radiographs show bone on bone arthritis all 3 compartments. He presents now for left Total Knee Arthroplasty.   Laboratory Data: Admission on 10/26/2013, Discharged on 10/28/2013  Component Date Value Ref Range Status  . ABO/RH(D) 10/26/2013 O POS   Final  . Antibody Screen 10/26/2013 NEG   Final  . Sample Expiration 10/26/2013 10/29/2013   Final  . ABO/RH(D) 10/26/2013 O POS   Final  . WBC 10/27/2013 11.4* 4.0 - 10.5 K/uL Final  . RBC 10/27/2013 3.74* 4.22 - 5.81 MIL/uL Final  . Hemoglobin 10/27/2013 10.8* 13.0 - 17.0 g/dL Final  . HCT 10/27/2013 31.7* 39.0 - 52.0 % Final  . MCV 10/27/2013  84.8  78.0 - 100.0 fL Final  . MCH 10/27/2013 28.9  26.0 - 34.0 pg Final  . MCHC 10/27/2013 34.1  30.0 - 36.0 g/dL Final  . RDW 10/27/2013 13.4  11.5 - 15.5 % Final  . Platelets 10/27/2013 176  150 - 400 K/uL Final  . Sodium 10/27/2013 135* 137 - 147 mEq/L Final  . Potassium 10/27/2013 4.5  3.7 - 5.3 mEq/L Final  . Chloride 10/27/2013 102  96 - 112 mEq/L Final  . CO2 10/27/2013 23  19 - 32 mEq/L Final  . Glucose, Bld 10/27/2013 121* 70 - 99 mg/dL Final  . BUN 10/27/2013 19  6 - 23 mg/dL Final  . Creatinine, Ser 10/27/2013 0.87  0.50 - 1.35 mg/dL Final  . Calcium 10/27/2013 8.3* 8.4 - 10.5 mg/dL Final  . GFR calc non Af Amer 10/27/2013 84* >90 mL/min Final  . GFR calc Af Amer 10/27/2013 >90  >90 mL/min Final   Comment: (NOTE)                          The eGFR has been calculated using the CKD EPI equation.                          This calculation has not been validated in all clinical situations.  eGFR's persistently <90 mL/min signify possible Chronic Kidney                          Disease.  . Anion gap 10/27/2013 10  5 - 15 Final  . WBC 10/28/2013 11.9* 4.0 - 10.5 K/uL Final  . RBC 10/28/2013 3.71* 4.22 - 5.81 MIL/uL Final  . Hemoglobin 10/28/2013 10.7* 13.0 - 17.0 g/dL Final  . HCT 10/28/2013 31.4* 39.0 - 52.0 % Final  . MCV 10/28/2013 84.6  78.0 - 100.0 fL Final  . MCH 10/28/2013 28.8  26.0 - 34.0 pg Final  . MCHC 10/28/2013 34.1  30.0 - 36.0 g/dL Final  . RDW 10/28/2013 13.4  11.5 - 15.5 % Final  . Platelets 10/28/2013 163  150 - 400 K/uL Final  . Sodium 10/28/2013 139  137 - 147 mEq/L Final  . Potassium 10/28/2013 4.2  3.7 - 5.3 mEq/L Final  . Chloride 10/28/2013 102  96 - 112 mEq/L Final  . CO2 10/28/2013 26  19 - 32 mEq/L Final  . Glucose, Bld 10/28/2013 141* 70 - 99 mg/dL Final  . BUN 10/28/2013 14  6 - 23 mg/dL Final  . Creatinine, Ser 10/28/2013 0.83  0.50 - 1.35 mg/dL Final  . Calcium 10/28/2013 8.6  8.4 - 10.5 mg/dL Final  . GFR calc non Af  Amer 10/28/2013 85* >90 mL/min Final  . GFR calc Af Amer 10/28/2013 >90  >90 mL/min Final   Comment: (NOTE)                          The eGFR has been calculated using the CKD EPI equation.                          This calculation has not been validated in all clinical situations.                          eGFR's persistently <90 mL/min signify possible Chronic Kidney                          Disease.  Georgiann Hahn gap 10/28/2013 11  5 - 15 Final  Hospital Outpatient Visit on 10/19/2013  Component Date Value Ref Range Status  . aPTT 10/19/2013 30  24 - 37 seconds Final  . WBC 10/19/2013 6.4  4.0 - 10.5 K/uL Final  . RBC 10/19/2013 4.66  4.22 - 5.81 MIL/uL Final  . Hemoglobin 10/19/2013 13.4  13.0 - 17.0 g/dL Final  . HCT 10/19/2013 40.0  39.0 - 52.0 % Final  . MCV 10/19/2013 85.8  78.0 - 100.0 fL Final  . MCH 10/19/2013 28.8  26.0 - 34.0 pg Final  . MCHC 10/19/2013 33.5  30.0 - 36.0 g/dL Final  . RDW 10/19/2013 13.9  11.5 - 15.5 % Final  . Platelets 10/19/2013 174  150 - 400 K/uL Final  . Sodium 10/19/2013 141  137 - 147 mEq/L Final  . Potassium 10/19/2013 3.9  3.7 - 5.3 mEq/L Final  . Chloride 10/19/2013 104  96 - 112 mEq/L Final  . CO2 10/19/2013 25  19 - 32 mEq/L Final  . Glucose, Bld 10/19/2013 233* 70 - 99 mg/dL Final  . BUN 10/19/2013 15  6 - 23 mg/dL Final  . Creatinine, Ser 10/19/2013 0.90  0.50 - 1.35 mg/dL Final  .  Calcium 10/19/2013 9.0  8.4 - 10.5 mg/dL Final  . Total Protein 10/19/2013 6.9  6.0 - 8.3 g/dL Final  . Albumin 10/19/2013 3.4* 3.5 - 5.2 g/dL Final  . AST 10/19/2013 23  0 - 37 U/L Final  . ALT 10/19/2013 20  0 - 53 U/L Final  . Alkaline Phosphatase 10/19/2013 107  39 - 117 U/L Final  . Total Bilirubin 10/19/2013 0.6  0.3 - 1.2 mg/dL Final  . GFR calc non Af Amer 10/19/2013 82* >90 mL/min Final  . GFR calc Af Amer 10/19/2013 >90  >90 mL/min Final   Comment: (NOTE)                          The eGFR has been calculated using the CKD EPI equation.                           This calculation has not been validated in all clinical situations.                          eGFR's persistently <90 mL/min signify possible Chronic Kidney                          Disease.  . Anion gap 10/19/2013 12  5 - 15 Final  . Prothrombin Time 10/19/2013 13.3  11.6 - 15.2 seconds Final  . INR 10/19/2013 1.01  0.00 - 1.49 Final  . Color, Urine 10/19/2013 YELLOW  YELLOW Final  . APPearance 10/19/2013 CLEAR  CLEAR Final  . Specific Gravity, Urine 10/19/2013 1.026  1.005 - 1.030 Final  . pH 10/19/2013 6.0  5.0 - 8.0 Final  . Glucose, UA 10/19/2013 250* NEGATIVE mg/dL Final  . Hgb urine dipstick 10/19/2013 TRACE* NEGATIVE Final  . Bilirubin Urine 10/19/2013 NEGATIVE  NEGATIVE Final  . Ketones, ur 10/19/2013 NEGATIVE  NEGATIVE mg/dL Final  . Protein, ur 10/19/2013 NEGATIVE  NEGATIVE mg/dL Final  . Urobilinogen, UA 10/19/2013 0.2  0.0 - 1.0 mg/dL Final  . Nitrite 10/19/2013 NEGATIVE  NEGATIVE Final  . Leukocytes, UA 10/19/2013 NEGATIVE  NEGATIVE Final  . MRSA, PCR 10/19/2013 NEGATIVE  NEGATIVE Final  . Staphylococcus aureus 10/19/2013 NEGATIVE  NEGATIVE Final   Comment:                                 The Xpert SA Assay (FDA                          approved for NASAL specimens                          in patients over 81 years of age),                          is one component of                          a comprehensive surveillance                          program.  Test performance has  been validated by [facility] for patients greater                          than or equal to 69 year old.                          It is not intended                          to diagnose infection nor to                          guide or monitor treatment.  . Squamous Epithelial / LPF 10/19/2013 RARE  RARE Final  . WBC, UA 10/19/2013 0-2  <3 WBC/hpf Final  . RBC / HPF 10/19/2013 0-2  <3 RBC/hpf Final  . Urine-Other 10/19/2013 MUCOUS PRESENT    Final     X-Rays:No results found.  EKG: Orders placed in visit on 03/18/13  . EKG 12-LEAD     Hospital Course: JAILON SCHAIBLE is a 73 y.o. who was admitted to [facility]. They were brought to the operating room on 10/26/2013 and underwent Procedure(s): LEFT TOTAL KNEE ARTHROPLASTY.  Patient tolerated the procedure well and was later transferred to the recovery room and then to the orthopaedic floor for postoperative care.  They were given PO and IV analgesics for pain control following their surgery.  They were given 24 hours of postoperative antibiotics of  Anti-infectives   Start     Dose/Rate Route Frequency Ordered Stop   10/26/13 1430  ceFAZolin (ANCEF) IVPB 2 g/50 mL premix     2 g 100 mL/hr over 30 Minutes Intravenous Every 6 hours 10/26/13 1204 10/26/13 2038   10/26/13 0620  ceFAZolin (ANCEF) IVPB 2 g/50 mL premix     2 g 100 mL/hr over 30 Minutes Intravenous On call to O.R. 10/26/13 4734 10/26/13 0840     and started on DVT prophylaxis in the form of Xarelto.   PT and OT were ordered for total joint protocol.  Discharge planning consulted to help with postop disposition and equipment needs.  Patient had a good night on the evening of surgery.  They started to get up OOB with therapy on day one. Hemovac drain was pulled without difficulty.  Continued to work with therapy into day two.  Dressing was changed on day two and the incision was healing well.  Patient was seen in rounds and was ready to go home.  Discharge home with home health  Diet - Cardiac diet and Diabetic diet  Follow up - in 2 weeks  Activity - WBAT  Disposition - Home  Condition Upon Discharge - Good  D/C Meds - See DC Summary  DVT Prophylaxis - Xarelto       Discharge Instructions   Call MD / Call 911    Complete by:  As directed   If you experience chest pain or shortness of breath, CALL 911 and be transported to the hospital emergency room.  If you develope a fever above 101 F, pus (white  drainage) or increased drainage or redness at the wound, or calf pain, call your surgeon's office.     Change dressing  Complete by:  As directed   Change dressing daily with sterile 4 x 4 inch gauze dressing and apply TED hose. Do not submerge the incision under water.     Constipation Prevention    Complete by:  As directed   Drink plenty of fluids.  Prune juice may be helpful.  You may use a stool softener, such as Colace (over the counter) 100 mg twice a day.  Use MiraLax (over the counter) for constipation as needed.     Diet - low sodium heart healthy    Complete by:  As directed      Discharge instructions    Complete by:  As directed   Pick up stool softner and laxative for home. Do not submerge incision under water. May shower. Continue to use ice for pain and swelling from surgery.  Take Xarelto for two and a half more weeks, then discontinue Xarelto. Once the patient has completed the blood thinner regimen, then take a Baby 81 mg Aspirin daily for three more weeks.     Do not put a pillow under the knee. Place it under the heel.    Complete by:  As directed      Do not sit on low chairs, stoools or toilet seats, as it may be difficult to get up from low surfaces    Complete by:  As directed      Driving restrictions    Complete by:  As directed   No driving until released by the physician.     Increase activity slowly as tolerated    Complete by:  As directed      Lifting restrictions    Complete by:  As directed   No lifting until released by the physician.     Patient may shower    Complete by:  As directed   You may shower without a dressing once there is no drainage.  Do not wash over the wound.  If drainage remains, do not shower until drainage stops.     TED hose    Complete by:  As directed   Use stockings (TED hose) for 3 weeks on both leg(s).  You may remove them at night for sleeping.     Weight bearing as tolerated    Complete by:  As directed               Medication List    STOP taking these medications       multivitamin with minerals Tabs tablet     oxyCODONE-acetaminophen 5-325 MG per tablet  Commonly known as:  PERCOCET      TAKE these medications       atenolol 50 MG tablet  Commonly known as:  TENORMIN  Take 50 mg by mouth every morning.     cetirizine 10 MG tablet  Commonly known as:  ZYRTEC  Take 10 mg by mouth daily as needed for allergies.     losartan-hydrochlorothiazide 100-25 MG per tablet  Commonly known as:  HYZAAR  Take 1 tablet by mouth every morning.     methocarbamol 500 MG tablet  Commonly known as:  ROBAXIN  Take 1 tablet (500 mg total) by mouth every 6 (six) hours as needed for muscle spasms.     omeprazole 40 MG capsule  Commonly known as:  PRILOSEC  Take 1 capsule (40 mg total) by mouth daily.     oxyCODONE 5 MG immediate release tablet  Commonly known as:  Oxy IR/ROXICODONE  Take 1-2 tablets (5-10 mg total) by mouth every 3 (three) hours as needed for moderate pain, severe pain or breakthrough pain.     rivaroxaban 10 MG Tabs tablet  Commonly known as:  XARELTO  - Take 1 tablet (10 mg total) by mouth daily. Take Xarelto for two and a half more weeks, then discontinue Xarelto.  - Once the patient has completed the blood thinner regimen, then take a Baby 81 mg Aspirin daily for three more weeks.     traMADol 50 MG tablet  Commonly known as:  ULTRAM  Take 1-2 tablets (50-100 mg total) by mouth every 6 (six) hours as needed (mild pain).       Follow-up Information   Follow up with [facility]. (home health physical therapy)    Contact information:   [address] [place] [address] [phone]       Follow up with [facility]. (for rolling walker and 3n1 (commode))    Contact information:   [address] [place] [address] [phone]       Follow up with Gearlean Alf, MD. Schedule an appointment as soon as possible for a  visit on 11/10/2013. (Call office at [phone] for follow up on Tuesday 11/10/2013)    Specialty:  Orthopedic Surgery   Contact information:   [address] [place] [address] [phone]       Signed: Arlee Muslim, PA-C Orthopaedic Surgery 10/29/2013, 9:39 AM

## 2013-10-28 NOTE — Progress Notes (Signed)
Physical Therapy Treatment Patient Details Name: Eric Richmond MRN: [id number] DOB: 1940/11/28 Today's Date: 10/28/2013    History of Present Illness Pt is a 73 year old male s/p L TKA.    PT Comments    Pt ambulated in hallway and practiced safe stair technique.  Pt provided with handout for spouse/assist with steps at home since no family present during session.  Pt reports he has no questions about HEP handout and performed LE exercises in recliner until breakfast arrived.  Pt had no further questions and reports likely d/c home today.   Follow Up Recommendations  Home health PT     Equipment Recommendations  Rolling walker with 5" wheels    Recommendations for Other Services       Precautions / Restrictions Precautions Precautions: Knee Required Braces or Orthoses: Knee Immobilizer - Left Knee Immobilizer - Left: Discontinue once straight leg raise with < 10 degree lag Restrictions Other Position/Activity Restrictions: WBAT    Mobility  Bed Mobility Overal bed mobility: Needs Assistance Bed Mobility: Sit to Supine     Supine to sit: Min guard     General bed mobility comments: verbal cues for self assist  Transfers Overall transfer level: Needs assistance Equipment used: Rolling walker (2 wheeled) Transfers: Sit to/from Stand Sit to Stand: Min guard         General transfer comment: verbal cues for UE positioning   Ambulation/Gait Ambulation/Gait assistance: Min guard Ambulation Distance (Feet): 220 Feet Assistive device: Rolling walker (2 wheeled) Gait Pattern/deviations: Step-through pattern;Antalgic;Decreased step length - right     General Gait Details: verbal cues for step length, posture, RW positioning   Stairs Stairs: Yes Stairs assistance: Min guard Stair Management: Step to pattern;Backwards;With walker Number of Stairs: 2 General stair comments: verbal cues for sequence and safe technique, pt educated he needs spouse/someone to hold  RW, provided handout  Wheelchair Mobility    Modified Rankin (Stroke Patients Only)       Balance                                    Cognition Arousal/Alertness: Awake/alert Behavior During Therapy: WFL for tasks assessed/performed Overall Cognitive Status: Within Functional Limits for tasks assessed                      Exercises Total Joint Exercises Ankle Circles/Pumps: Both;15 reps;AROM Quad Sets: AROM;Both;15 reps Short Arc QuadSinclair Ship;Left;15 reps Heel Slides: AAROM;Left;15 reps;Supine Hip ABduction/ADduction: Left;15 reps;AAROM    General Comments        Pertinent Vitals/Pain Pain Assessment: 0-10 Pain Score: 5  Pain Location: L knee Pain Descriptors / Indicators: Aching;Sore Pain Intervention(s): RN gave pain meds during session;Ice applied;Repositioned;Monitored during session;Limited activity within patient's tolerance    Home Living                      Prior Function            PT Goals (current goals can now be found in the care plan section) Progress towards PT goals: Progressing toward goals    Frequency  7X/week    PT Plan Current plan remains appropriate    Co-evaluation             End of Session Equipment Utilized During Treatment: Gait belt;Left knee immobilizer Activity Tolerance: Patient tolerated treatment well Patient left: with call bell/phone within reach;in  chair     Time: 6503-5465 PT Time Calculation (min): 26 min  Charges:  $Gait Training: 23-37 mins                    G Codes:      Eric Richmond,Eric Richmond 2013/11/24, 9:14 AM Eric Richmond, PT, DPT 11-24-13 Pager: [phone]

## 2013-11-07 ENCOUNTER — Emergency Department (HOSPITAL_COMMUNITY): Payer: Medicare Other

## 2013-11-07 ENCOUNTER — Emergency Department (HOSPITAL_COMMUNITY)
Admission: EM | Admit: 2013-11-07 | Discharge: 2013-11-07 | Disposition: A | Discharge status: Home / Self Care | Payer: Medicare Other | Attending: Emergency Medicine | Admitting: Emergency Medicine

## 2013-11-07 ENCOUNTER — Encounter (HOSPITAL_COMMUNITY): Payer: Self-pay | Admitting: Emergency Medicine

## 2013-11-07 DIAGNOSIS — I10 Essential (primary) hypertension: Secondary | ICD-10-CM | POA: Diagnosis not present

## 2013-11-07 DIAGNOSIS — K219 Gastro-esophageal reflux disease without esophagitis: Secondary | ICD-10-CM | POA: Insufficient documentation

## 2013-11-07 DIAGNOSIS — Z79899 Other long term (current) drug therapy: Secondary | ICD-10-CM | POA: Diagnosis not present

## 2013-11-07 DIAGNOSIS — R4182 Altered mental status, unspecified: Secondary | ICD-10-CM | POA: Diagnosis not present

## 2013-11-07 DIAGNOSIS — Z8739 Personal history of other diseases of the musculoskeletal system and connective tissue: Secondary | ICD-10-CM | POA: Insufficient documentation

## 2013-11-07 DIAGNOSIS — Z7901 Long term (current) use of anticoagulants: Secondary | ICD-10-CM | POA: Diagnosis not present

## 2013-11-07 DIAGNOSIS — Z9981 Dependence on supplemental oxygen: Secondary | ICD-10-CM | POA: Insufficient documentation

## 2013-11-07 DIAGNOSIS — Z87448 Personal history of other diseases of urinary system: Secondary | ICD-10-CM | POA: Diagnosis not present

## 2013-11-07 DIAGNOSIS — H9390 Unspecified disorder of ear, unspecified ear: Secondary | ICD-10-CM | POA: Diagnosis not present

## 2013-11-07 DIAGNOSIS — Z87891 Personal history of nicotine dependence: Secondary | ICD-10-CM | POA: Diagnosis not present

## 2013-11-07 DIAGNOSIS — G4733 Obstructive sleep apnea (adult) (pediatric): Secondary | ICD-10-CM | POA: Insufficient documentation

## 2013-11-07 LAB — BASIC METABOLIC PANEL
Anion gap: 12 (ref 5–15)
BUN: 20 mg/dL (ref 6–23)
CO2: 27 mEq/L (ref 19–32)
Calcium: 9.7 mg/dL (ref 8.4–10.5)
Chloride: 99 mEq/L (ref 96–112)
Creatinine, Ser: 0.87 mg/dL (ref 0.50–1.35)
GFR calc Af Amer: >90 mL/min (ref >90)
GFR calc non Af Amer: 84 mL/min — ABNORMAL LOW (ref >90)
Glucose, Bld: 140 mg/dL — ABNORMAL HIGH (ref 70–99)
Potassium: 4.1 mEq/L (ref 3.7–5.3)
Sodium: 138 mEq/L (ref 137–147)

## 2013-11-07 LAB — CBC WITH DIFFERENTIAL/PLATELET
Basophils Absolute: 0 10*3/uL (ref 0.0–0.1)
Basophils Relative: 0 % (ref 0–1)
Eosinophils Absolute: 0.2 10*3/uL (ref 0.0–0.7)
Eosinophils Relative: 2 % (ref 0–5)
HCT: 32.5 % — ABNORMAL LOW (ref 39.0–52.0)
Hemoglobin: 11 g/dL — ABNORMAL LOW (ref 13.0–17.0)
Lymphocytes Relative: 17 % (ref 12–46)
Lymphs Abs: 1.6 10*3/uL (ref 0.7–4.0)
MCH: 28.6 pg (ref 26.0–34.0)
MCHC: 33.8 g/dL (ref 30.0–36.0)
MCV: 84.6 fL (ref 78.0–100.0)
Monocytes Absolute: 0.5 10*3/uL (ref 0.1–1.0)
Monocytes Relative: 6 % (ref 3–12)
Neutro Abs: 7.1 10*3/uL (ref 1.7–7.7)
Neutrophils Relative %: 75 % (ref 43–77)
Platelets: 305 10*3/uL (ref 150–400)
RBC: 3.84 MIL/uL — ABNORMAL LOW (ref 4.22–5.81)
RDW: 12.9 % (ref 11.5–15.5)
WBC: 9.3 10*3/uL (ref 4.0–10.5)

## 2013-11-07 LAB — URINALYSIS, ROUTINE W REFLEX MICROSCOPIC
Bilirubin Urine: NEGATIVE
Glucose, UA: NEGATIVE mg/dL
Ketones, ur: NEGATIVE mg/dL
Leukocytes, UA: NEGATIVE
Nitrite: NEGATIVE
Protein, ur: NEGATIVE mg/dL
Specific Gravity, Urine: 1.025 (ref 1.005–1.030)
Urobilinogen, UA: 1 mg/dL (ref 0.0–1.0)
pH: 6 (ref 5.0–8.0)

## 2013-11-07 LAB — URINE MICROSCOPIC-ADD ON

## 2013-11-07 LAB — CBG MONITORING, ED: GLUCOSE-CAPILLARY: 142 mg/dL — AB (ref 70–99)

## 2013-11-07 NOTE — ED Notes (Signed)
Bed: WA10 Expected date: 11/07/13 Expected time: 9:09 AM Means of arrival: Ambulance Comments: "Loopy from pain meds" family would like pt evaluated

## 2013-11-07 NOTE — ED Notes (Signed)
Dressing reapplied to leg per family instruction.

## 2013-11-07 NOTE — ED Notes (Signed)
Pt medications went home with pt and wife

## 2013-11-07 NOTE — ED Provider Notes (Signed)
CSN: [id number]     Arrival date & time 11/07/13  0912 History   First MD Initiated Contact with Patient 11/07/13 [phone]     Chief Complaint  Patient presents with  . Altered Mental Status     (Consider location/radiation/quality/duration/timing/severity/associated sxs/prior Treatment) HPI  73yM brought in by EMS and now currently with family at bedside. Seeking evaluation for change in mental status. Slow to respond to some questions. Seeing things on walls. Not sleeping well. Family thinks may be from pain medication. Pt has been on these meds for 2 weeks d/t knee replacement. Not new to patient. However, after meds, pt became altered and more confused per wife. No recent falls. No fever or chills. Pt with no acute complaints.    Past Medical History  Diagnosis Date  . Unspecified essential hypertension   . GERD (gastroesophageal reflux disease)   . ED (erectile dysfunction)   . Osteoarthritis   . Hard of hearing   . Anal fissure 20 years ago  . Allergy   . OSA (obstructive sleep apnea)     per Dr. Gwenette Greet , cpap -2.0   Past Surgical History  Procedure Laterality Date  . Knee arthroscopy      left knee  . Colonoscopy  06-11-13    per Dr. Hilarie Fredrickson, had 8 adenomatous polyps removed, repeat in 3 years   . Total knee arthroplasty Left 10/26/2013    Procedure: LEFT TOTAL KNEE ARTHROPLASTY;  Surgeon: Gearlean Alf, MD;  Location: [facility] ORS;  Service: Orthopedics;  Laterality: Left;   Family History  Problem Relation Age of Onset  . Diabetes    . Hypertension    . Breast cancer Sister   . Diabetes Sister   . Hypertension Sister   . Thyroid cancer Sister   . Colon cancer Neg Hx   . Esophageal cancer Neg Hx   . Rectal cancer Neg Hx   . Stomach cancer Neg Hx   . Diabetes Mother   . Hypertension Mother   . Diabetes Father   . Hypertension Father   . Diabetes Brother   . Hypertension Brother    History  Substance Use Topics  . Smoking status: Former Smoker -- 0.30 packs/day for  20 years    Types: Cigarettes    Quit date: 02/26/1977  . Smokeless tobacco: Current User    Types: Chew     Comment: occ  . Alcohol Use: No    Review of Systems  All systems reviewed and negative, other than as noted in HPI.   Allergies  Review of patient's allergies indicates no known allergies.  Home Medications   Prior to Admission medications   Medication Sig Start Date End Date Taking? Authorizing Provider  atenolol (TENORMIN) 50 MG tablet Take 50 mg by mouth every morning.   Yes Historical Provider, MD  cetirizine (ZYRTEC) 10 MG tablet Take 10 mg by mouth daily as needed for allergies.   Yes Historical Provider, MD  losartan-hydrochlorothiazide (HYZAAR) 100-25 MG per tablet Take 1 tablet by mouth every morning.   Yes Historical Provider, MD  omeprazole (PRILOSEC) 40 MG capsule Take 1 capsule (40 mg total) by mouth daily. 07/31/13  Yes [name], MD  oxyCODONE (OXY IR/ROXICODONE) 5 MG immediate release tablet Take 1-2 tablets (5-10 mg total) by mouth every 3 (three) hours as needed for moderate pain, severe pain or breakthrough pain. 10/28/13  Yes Arlee Muslim, PA-C  PROAIR HFA 108 (90 BASE) MCG/ACT inhaler Inhale 1 puff into the  lungs every 6 (six) hours as needed for wheezing or shortness of breath.  10/03/13  Yes Historical Provider, MD  rivaroxaban (XARELTO) 10 MG TABS tablet Take 1 tablet (10 mg total) by mouth daily. Take Xarelto for two and a half more weeks, then discontinue Xarelto. Once the patient has completed the blood thinner regimen, then take a Baby 81 mg Aspirin daily for three more weeks. 10/28/13  Yes Arlee Muslim, PA-C  traMADol (ULTRAM) 50 MG tablet Take 1-2 tablets (50-100 mg total) by mouth every 6 (six) hours as needed (mild pain). 10/28/13  Yes Arlee Muslim, PA-C   BP 119/67  Pulse 62  Temp(Src) 97.7 F (36.5 C) (Oral)  Resp 14  SpO2 97% Physical Exam  Nursing note and vitals reviewed. Constitutional: He appears well-developed and well-nourished. No  distress.  HENT:  Head: Normocephalic and atraumatic.  Eyes: Conjunctivae are normal. Right eye exhibits no discharge. Left eye exhibits no discharge.  Neck: Neck supple.  No nuchal rigidity  Cardiovascular: Normal rate, regular rhythm and normal heart sounds.  Exam reveals no gallop and no friction rub.   No murmur heard. Pulmonary/Chest: Effort normal and breath sounds normal. No respiratory distress.  Abdominal: Soft. He exhibits no distension. There is no tenderness.  Musculoskeletal: He exhibits no edema and no tenderness.  L knee appears to be healing w/o complication. Some swelling consistent with timing of procedure. No rash. No drainage. Incision intact. No TTP.   Neurological: He is alert. No cranial nerve deficit. He exhibits normal muscle tone. Coordination normal.  Skin: Skin is warm and dry.  Psychiatric: He has a normal mood and affect. His behavior is normal. Thought content normal.    ED Course  Procedures (including critical care time) Labs Review Labs Reviewed  CBG MONITORING, ED - Abnormal; Notable for the following:    Glucose-Capillary 142 (*)    All other components within normal limits  URINALYSIS, ROUTINE W REFLEX MICROSCOPIC  CBC WITH DIFFERENTIAL  BASIC METABOLIC PANEL    Imaging Review No results found.   EKG Interpretation None      MDM   Final diagnoses:  Altered mental status, unspecified altered mental status type    73 year old male brought in by family for evaluation of change in his mental status. Restlessness, hallucinations consistent with delirium. Currently at his baseline mental status. Neurological examination is nonfocal. He has no acute complaints. Suspect that some of his symptoms may be from pain medication usage after his recent left knee surgery. Presume he had a catheter during the procedure and he reports that urine has been darker over the last 2 days. Consider infectious etiology such as urinary tract infection. We'll check  basic labs. Urinalysis. Chest x-ray. This surgical site appears to be healing very well. Reports that his pain has been progressively getting better. Swelling is progressively decreased. No concerning drainage. No rash. No clinical signs or symptoms of DVT. He is currently on xarelto presumably for DVT prophylaxis postop.    Virgel Manifold, MD 11/16/13 [phone]

## 2013-11-07 NOTE — ED Notes (Signed)
Per EMS, pt from home. Pt was given medication last night.  Oxycodone and Tramadol.  Pt has been on these meds for 2 weeks d/t knee replacement.  Not new to patient.  However, after meds, pt became altered and more confused per wife status.  EMS states patient is alert and oriented.  Walked down steps with a cane for EMS transport.  Vitals:  140/98, hr 80, resp 20, 98% ra.  No stroke symptoms noted.

## 2013-11-17 ENCOUNTER — Ambulatory Visit (INDEPENDENT_AMBULATORY_CARE_PROVIDER_SITE_OTHER): Payer: Medicare Other | Admitting: Family Medicine

## 2013-11-17 ENCOUNTER — Encounter: Payer: Self-pay | Admitting: Family Medicine

## 2013-11-17 VITALS — BP 128/79 | HR 63 | Temp 97.9°F | Ht 68.0 in | Wt 200.0 lb

## 2013-11-17 DIAGNOSIS — R1013 Epigastric pain: Secondary | ICD-10-CM

## 2013-11-17 DIAGNOSIS — R7309 Other abnormal glucose: Secondary | ICD-10-CM

## 2013-11-17 DIAGNOSIS — R739 Hyperglycemia, unspecified: Secondary | ICD-10-CM

## 2013-11-17 LAB — CBC WITH DIFFERENTIAL/PLATELET
Basophils Absolute: 0 10*3/uL (ref 0.0–0.1)
Basophils Relative: 0.5 % (ref 0.0–3.0)
EOS ABS: 0.1 10*3/uL (ref 0.0–0.7)
Eosinophils Relative: 2.4 % (ref 0.0–5.0)
HCT: 36 % — ABNORMAL LOW (ref 39.0–52.0)
Hemoglobin: 11.9 g/dL — ABNORMAL LOW (ref 13.0–17.0)
LYMPHS PCT: 25.1 % (ref 12.0–46.0)
Lymphs Abs: 1.6 10*3/uL (ref 0.7–4.0)
MCHC: 33.2 g/dL (ref 30.0–36.0)
MCV: 87.8 fl (ref 78.0–100.0)
Monocytes Absolute: 0.3 10*3/uL (ref 0.1–1.0)
Monocytes Relative: 5.5 % (ref 3.0–12.0)
NEUTROS ABS: 4.1 10*3/uL (ref 1.4–7.7)
NEUTROS PCT: 66.5 % (ref 43.0–77.0)
PLATELETS: 288 10*3/uL (ref 150.0–400.0)
RBC: 4.1 Mil/uL — ABNORMAL LOW (ref 4.22–5.81)
RDW: 13.6 % (ref 11.5–15.5)
WBC: 6.2 10*3/uL (ref 4.0–10.5)

## 2013-11-17 LAB — AMYLASE: Amylase: 83 U/L (ref 27–131)

## 2013-11-17 LAB — HEPATIC FUNCTION PANEL
ALK PHOS: 87 U/L (ref 39–117)
ALT: 10 U/L (ref 0–53)
AST: 18 U/L (ref 0–37)
Albumin: 3.5 g/dL (ref 3.5–5.2)
Bilirubin, Direct: 0.2 mg/dL (ref 0.0–0.3)
TOTAL PROTEIN: 7.3 g/dL (ref 6.0–8.3)
Total Bilirubin: 0.9 mg/dL (ref 0.2–1.2)

## 2013-11-17 LAB — BASIC METABOLIC PANEL
BUN: 14 mg/dL (ref 6–23)
CHLORIDE: 102 meq/L (ref 96–112)
CO2: 25 meq/L (ref 19–32)
Calcium: 9.4 mg/dL (ref 8.4–10.5)
Creatinine, Ser: 1 mg/dL (ref 0.4–1.5)
GFR: 93.03 mL/min (ref >60.00)
GLUCOSE: 184 mg/dL — AB (ref 70–99)
Potassium: 3.3 mEq/L — ABNORMAL LOW (ref 3.5–5.1)
SODIUM: 135 meq/L (ref 135–145)

## 2013-11-17 LAB — HEMOGLOBIN A1C: Hgb A1c MFr Bld: 6.8 % — ABNORMAL HIGH (ref 4.6–6.5)

## 2013-11-17 LAB — LIPASE: LIPASE: 39 U/L (ref 11.0–59.0)

## 2013-11-17 NOTE — Progress Notes (Signed)
Pre visit review using our clinic review tool, if applicable. No additional management support is needed unless otherwise documented below in the visit note. 

## 2013-11-17 NOTE — Progress Notes (Signed)
   Subjective:    Patient ID: Eric Richmond, male    DOB: 03-16-1940, 73 y.o.   MRN: [id number]  HPI Here for 2 weeks of frequent epigastric pains, decreased appetite, and nausea without vomiting. No fevers. No heartburn. He takes Omeprazole daily as usual. Eating food makes theis pain worse, then he feels better between meals.    Review of Systems  Constitutional: Negative.   Respiratory: Negative.   Cardiovascular: Negative.   Gastrointestinal: Positive for nausea, abdominal pain and abdominal distention. Negative for vomiting, diarrhea, constipation, blood in stool, anal bleeding and rectal pain.       Objective:   Physical Exam  Constitutional: He appears well-developed and well-nourished.  Cardiovascular: Normal rate, regular rhythm, normal heart sounds and intact distal pulses.   Pulmonary/Chest: Effort normal and breath sounds normal.  Abdominal: Soft. Bowel sounds are normal. He exhibits no distension and no mass. There is no rebound and no guarding.  Mildly tender in the epigastrium           Assessment & Plan:  This could represent gall bladder disease. He will avoid fatty foods for now. Stay on Omeprazole. Get labs today and set up an abdominal US soon.

## 2013-11-18 ENCOUNTER — Telehealth: Payer: Self-pay | Admitting: Family Medicine

## 2013-11-18 ENCOUNTER — Encounter (INDEPENDENT_AMBULATORY_CARE_PROVIDER_SITE_OTHER): Payer: Self-pay

## 2013-11-18 ENCOUNTER — Ambulatory Visit
Admission: RE | Admit: 2013-11-18 | Discharge: 2013-11-18 | Disposition: A | Discharge status: Home / Self Care | Payer: Medicare Other | Source: Ambulatory Visit | Attending: Family Medicine | Admitting: Family Medicine

## 2013-11-18 DIAGNOSIS — R1013 Epigastric pain: Secondary | ICD-10-CM

## 2013-11-18 MED ORDER — POTASSIUM CHLORIDE CRYS ER 20 MEQ PO TBCR: 20.0000 meq | EXTENDED_RELEASE_TABLET | Freq: Every day | ORAL | Status: DC

## 2013-11-18 NOTE — Addendum Note (Signed)
Addended by: Aggie Hacker A on: 11/18/2013 05:35 PM   Modules accepted: Orders

## 2013-11-18 NOTE — Telephone Encounter (Signed)
[name] would like pt's lab results. She states pt has a hard time understanding so she would like a callback on her cell.

## 2013-11-19 NOTE — Telephone Encounter (Signed)
I spoke with pt and wife, went over results.

## 2013-12-01 ENCOUNTER — Telehealth: Payer: Self-pay | Admitting: Family Medicine

## 2013-12-01 NOTE — Telephone Encounter (Signed)
Pt following up on sample request.

## 2013-12-01 NOTE — Telephone Encounter (Signed)
Please see the original note from [place].    PA has been submitted, ref# [id number].   The medication was d/c from current med list by another office and marked "error", can you please add it again??

## 2013-12-01 NOTE — Telephone Encounter (Signed)
Pt states insurance is denying coverage for Cialis, and a PA is required.  Pt wants to know if any samples are available in the meantime.

## 2013-12-02 ENCOUNTER — Telehealth: Payer: Self-pay | Admitting: Family Medicine

## 2013-12-02 MED ORDER — TADALAFIL 20 MG PO TABS: 20.0000 mg | ORAL_TABLET | Freq: Every day | ORAL | Status: DC | PRN

## 2013-12-02 NOTE — Telephone Encounter (Signed)
PA for Cialis was denied.  Treatment for sexual or erectile dysfunction is excluded from coverage under Medicare rules.

## 2013-12-02 NOTE — Telephone Encounter (Signed)
Per [name], okay to give a sample box ( only had 3 tablets in box ). I called and left a message that sample is ready for pick up and that pt's insurance is not going to cover medication.

## 2013-12-02 NOTE — Telephone Encounter (Signed)
Pt requesting a script for Cialis and send to [facility] on [address].

## 2013-12-02 NOTE — Telephone Encounter (Signed)
Per [name], okay to call in Cialis 20 mg take 1 po prn # 10 with 11 refills. I did send [facility] to [facility].

## 2013-12-15 ENCOUNTER — Ambulatory Visit (INDEPENDENT_AMBULATORY_CARE_PROVIDER_SITE_OTHER): Payer: Medicare Other | Admitting: Family Medicine

## 2013-12-15 ENCOUNTER — Telehealth: Payer: Self-pay | Admitting: Family Medicine

## 2013-12-15 DIAGNOSIS — Z23 Encounter for immunization: Secondary | ICD-10-CM

## 2013-12-15 NOTE — Telephone Encounter (Signed)
[id number] - [place], [place] - [address] is requesting 90 day re-fill on potassium chloride SA (KLOR-CON M20) 20 MEQ tablet

## 2013-12-16 ENCOUNTER — Telehealth: Payer: Self-pay | Admitting: Family Medicine

## 2013-12-16 MED ORDER — POTASSIUM CHLORIDE CRYS ER 20 MEQ PO TBCR: 20.0000 meq | EXTENDED_RELEASE_TABLET | Freq: Every day | ORAL | Status: DC

## 2013-12-16 NOTE — Telephone Encounter (Signed)
Please refer to 12/01/13 note. PA was already denied.

## 2013-12-16 NOTE — Telephone Encounter (Signed)
[id number] - [place], [place] - [address] is requesting 90 day re-fill on potassium chloride SA (KLOR-CON M20) 20 MEQ tablet

## 2013-12-16 NOTE — Telephone Encounter (Signed)
I sent script e-scribe. 

## 2013-12-16 NOTE — Telephone Encounter (Signed)
I resent script e-scribe. 

## 2013-12-16 NOTE — Telephone Encounter (Signed)
Pt would like to know if we could do a prior auth for his tadalafil (CIALIS) 20 MG tablet due to his condition.  Pt states 10 pill was $500 and he did not pu/

## 2013-12-17 NOTE — Telephone Encounter (Signed)
I left a voice message with below information. 

## 2014-01-06 NOTE — Progress Notes (Signed)
Please put orders in Epic surgery 01-13-14 pre op 01-11-14 Thanks

## 2014-01-11 ENCOUNTER — Encounter (HOSPITAL_COMMUNITY): Payer: Self-pay

## 2014-01-11 ENCOUNTER — Encounter (HOSPITAL_COMMUNITY)
Admission: RE | Admit: 2014-01-11 | Discharge: 2014-01-11 | Disposition: A | Discharge status: Home / Self Care | Payer: Medicare Other | Source: Ambulatory Visit | Attending: Orthopedic Surgery | Admitting: Orthopedic Surgery

## 2014-01-11 DIAGNOSIS — Z87891 Personal history of nicotine dependence: Secondary | ICD-10-CM | POA: Diagnosis not present

## 2014-01-11 DIAGNOSIS — Z79899 Other long term (current) drug therapy: Secondary | ICD-10-CM | POA: Diagnosis not present

## 2014-01-11 DIAGNOSIS — M179 Osteoarthritis of knee, unspecified: Secondary | ICD-10-CM | POA: Diagnosis not present

## 2014-01-11 DIAGNOSIS — Z7982 Long term (current) use of aspirin: Secondary | ICD-10-CM | POA: Diagnosis not present

## 2014-01-11 DIAGNOSIS — M189 Osteoarthritis of first carpometacarpal joint, unspecified: Secondary | ICD-10-CM | POA: Diagnosis not present

## 2014-01-11 DIAGNOSIS — M24662 Ankylosis, left knee: Secondary | ICD-10-CM | POA: Diagnosis not present

## 2014-01-11 DIAGNOSIS — G4733 Obstructive sleep apnea (adult) (pediatric): Secondary | ICD-10-CM | POA: Diagnosis not present

## 2014-01-11 DIAGNOSIS — K219 Gastro-esophageal reflux disease without esophagitis: Secondary | ICD-10-CM | POA: Diagnosis not present

## 2014-01-11 DIAGNOSIS — N529 Male erectile dysfunction, unspecified: Secondary | ICD-10-CM | POA: Diagnosis not present

## 2014-01-11 DIAGNOSIS — I10 Essential (primary) hypertension: Secondary | ICD-10-CM | POA: Diagnosis not present

## 2014-01-11 HISTORY — DX: Pneumonia, unspecified organism: J18.9

## 2014-01-11 LAB — BASIC METABOLIC PANEL
Anion gap: 12 (ref 5–15)
BUN: 14 mg/dL (ref 6–23)
CO2: 27 mEq/L (ref 19–32)
Calcium: 9.4 mg/dL (ref 8.4–10.5)
Chloride: 101 mEq/L (ref 96–112)
Creatinine, Ser: 0.96 mg/dL (ref 0.50–1.35)
GFR calc Af Amer: >90 mL/min (ref >90)
GFR calc non Af Amer: 80 mL/min — ABNORMAL LOW (ref >90)
Glucose, Bld: 113 mg/dL — ABNORMAL HIGH (ref 70–99)
Potassium: 4 mEq/L (ref 3.7–5.3)
Sodium: 140 mEq/L (ref 137–147)

## 2014-01-11 LAB — CBC
HEMATOCRIT: 40 % (ref 39.0–52.0)
Hemoglobin: 13.2 g/dL (ref 13.0–17.0)
MCH: 28.6 pg (ref 26.0–34.0)
MCHC: 33 g/dL (ref 30.0–36.0)
MCV: 86.6 fL (ref 78.0–100.0)
Platelets: 186 10*3/uL (ref 150–400)
RBC: 4.62 MIL/uL (ref 4.22–5.81)
RDW: 14.4 % (ref 11.5–15.5)
WBC: 7.2 10*3/uL (ref 4.0–10.5)

## 2014-01-11 NOTE — Progress Notes (Signed)
Arlee Muslim, PA  -  Please enter preop orders in Epic for Eric Richmond - his preop is today 01/11/14 [facility] - his date of surgery is 11/18. Thanks.

## 2014-01-11 NOTE — Patient Instructions (Signed)
YOUR SURGERY IS SCHEDULED AT [facility]  ON:  Wednesday  11/18  REPORT TO  SHORT STAY CENTER AT:  3:45 PM   DO NOT EAT  ANYTHING AFTER MIDNIGHT THE NIGHT BEFORE YOUR SURGERY.  NO FOOD, NO CHEWING GUM, NO MINTS, NO CANDIES, NO CHEWING TOBACCO. YOU MAY HAVE CLEAR LIQUIDS TO DRINK FROM MIDNIGHT UNTIL 11:45 AM DAY OF YOUR SURGERY - LIKE WATER, GINGERALE APPLE OR GRAPE OR CRANBERRY JUICE, COFFEE - JUST NO MILK OR CREAM.       NOTHING TO DRINK AFTER 11:45 AM DAY OF YOUR SURGERY.    PLEASE TAKE THE FOLLOWING MEDICATIONS THE AM OF YOUR SURGERY WITH A FEW SIPS OF WATER:  ATENOLOL, CETIRIZINE, OMEPRAZOLE.   IF YOU HAVE SLEEP APNEA AND USE CPAP OR BIPAP--PLEASE BRING THE MASK AND THE TUBING.  DO NOT BRING YOUR MACHINE.   DO NOT BRING VALUABLES, MONEY, CREDIT CARDS.  DO NOT WEAR JEWELRY, MAKE-UP, NAIL POLISH AND NO METAL PINS OR CLIPS IN YOUR HAIR. CONTACT LENS, DENTURES / PARTIALS, GLASSES SHOULD NOT BE WORN TO SURGERY AND IN MOST CASES-HEARING AIDS WILL NEED TO BE REMOVED.  BRING YOUR GLASSES CASE, ANY EQUIPMENT NEEDED FOR YOUR CONTACT LENS. FOR PATIENTS ADMITTED TO THE HOSPITAL--CHECK OUT TIME THE DAY OF DISCHARGE IS 11:00 AM.  ALL INPATIENT ROOMS ARE PRIVATE - WITH BATHROOM, TELEPHONE, TELEVISION AND WIFI INTERNET.  IF YOU ARE BEING DISCHARGED THE SAME DAY OF YOUR SURGERY--YOU CAN NOT DRIVE YOURSELF HOME--AND SHOULD NOT GO HOME ALONE BY TAXI OR BUS.  NO DRIVING OR OPERATING MACHINERY, OR MAKING LEGAL DECISIONS FOR 24 HOURS FOLLOWING ANESTHESIA / PAIN MEDICATIONS.  PLEASE MAKE ARRANGEMENTS FOR SOMEONE TO BE WITH YOU AT HOME THE FIRST 24 HOURS AFTER SURGERY. RESPONSIBLE DRIVER'S NAME / PHONE                                [name] WILL BE WITH HIM DAY OF SURGERY.                                                     PLEASE BE AWARE THAT YOU MAY NEED ADDITIONAL BLOOD DRAWN DAY OF YOUR SURGERY  _______________________________________________________________________   [facility]  - Preparing for Surgery Before surgery, you can play an important role.  Because skin is not sterile, your skin needs to be as free of germs as possible.  You can reduce the number of germs on your skin by washing with CHG (chlorahexidine gluconate) soap before surgery.  CHG is an antiseptic cleaner which kills germs and bonds with the skin to continue killing germs even after washing. Please DO NOT use if you have an allergy to CHG or antibacterial soaps.  If your skin becomes reddened/irritated stop using the CHG and inform your nurse when you arrive at Short Stay. Do not shave (including legs and underarms) for at least 48 hours prior to the first CHG shower.  You may shave your face/neck. Please follow these instructions carefully:  1.  Shower with CHG Soap the night before surgery and the  morning of Surgery.  2.  If you choose to wash your hair, wash your hair first as usual with your  normal  shampoo.  3.  After you shampoo, rinse your hair and  body thoroughly to remove the  shampoo.                           4.  Use CHG as you would any other liquid soap.  You can apply chg directly  to the skin and wash                       Gently with a scrungie or clean washcloth.  5.  Apply the CHG Soap to your body ONLY FROM THE NECK DOWN.   Do not use on face/ open                           Wound or open sores. Avoid contact with eyes, ears mouth and genitals (private parts).                       Wash face,  Genitals (private parts) with your normal soap.             6.  Wash thoroughly, paying special attention to the area where your surgery  will be performed.  7.  Thoroughly rinse your body with warm water from the neck down.  8.  DO NOT shower/wash with your normal soap after using and rinsing off  the CHG Soap.                9.  Pat yourself dry with a clean towel.            10.  Wear clean pajamas.            11.  Place clean sheets on your bed the night of your first shower and do not  sleep  with pets. Day of Surgery : Do not apply any lotions/deodorants the morning of surgery.  Please wear clean clothes to the hospital/surgery center.  FAILURE TO FOLLOW THESE INSTRUCTIONS MAY RESULT IN THE CANCELLATION OF YOUR SURGERY PATIENT SIGNATURE_________________________________  NURSE SIGNATURE__________________________________  ________________________________________________________________________   Adam Phenix  An incentive spirometer is a tool that can help keep your lungs clear and active. This tool measures how well you are filling your lungs with each breath. Taking long deep breaths may help reverse or decrease the chance of developing breathing (pulmonary) problems (especially infection) following:  A long period of time when you are unable to move or be active. BEFORE THE PROCEDURE   If the spirometer includes an indicator to show your best effort, your nurse or respiratory therapist will set it to a desired goal.  If possible, sit up straight or lean slightly forward. Try not to slouch.  Hold the incentive spirometer in an upright position. INSTRUCTIONS FOR USE  1. Sit on the edge of your bed if possible, or sit up as far as you can in bed or on a chair. 2. Hold the incentive spirometer in an upright position. 3. Breathe out normally. 4. Place the mouthpiece in your mouth and seal your lips tightly around it. 5. Breathe in slowly and as deeply as possible, raising the piston or the ball toward the top of the column. 6. Hold your breath for 3-5 seconds or for as long as possible. Allow the piston or ball to fall to the bottom of the column. 7. Remove the mouthpiece from your mouth and breathe out normally. 8. Rest for a few seconds and repeat Steps 1 through  7 at least 10 times every 1-2 hours when you are awake. Take your time and take a few normal breaths between deep breaths. 9. The spirometer may include an indicator to show your best effort. Use the  indicator as a goal to work toward during each repetition. 10. After each set of 10 deep breaths, practice coughing to be sure your lungs are clear. If you have an incision (the cut made at the time of surgery), support your incision when coughing by placing a pillow or rolled up towels firmly against it. Once you are able to get out of bed, walk around indoors and cough well. You may stop using the incentive spirometer when instructed by your caregiver.  RISKS AND COMPLICATIONS  Take your time so you do not get dizzy or light-headed.  If you are in pain, you may need to take or ask for pain medication before doing incentive spirometry. It is harder to take a deep breath if you are having pain. AFTER USE  Rest and breathe slowly and easily.  It can be helpful to keep track of a log of your progress. Your caregiver can provide you with a simple table to help with this. If you are using the spirometer at home, follow these instructions: [place] IF:   You are having difficultly using the spirometer.  You have trouble using the spirometer as often as instructed.  Your pain medication is not giving enough relief while using the spirometer.  You develop fever of 100.5 F (38.1 C) or higher. SEEK IMMEDIATE MEDICAL CARE IF:   You cough up bloody sputum that had not been present before.  You develop fever of 102 F (38.9 C) or greater.  You develop worsening pain at or near the incision site. MAKE SURE YOU:   Understand these instructions.  Will watch your condition.  Will get help right away if you are not doing well or get worse. Document Released: 06/25/2006 Document Revised: 05/07/2011 Document Reviewed: 08/26/2006 [facility] Patient Information 2014 [place], [place].   ________________________________________________________________________

## 2014-01-11 NOTE — Pre-Procedure Instructions (Signed)
CXR AND EKG REPORTS ARE IN EPIC FROM 11-07-13 PT'S CHART GIVEN TO FOLLOW UP NURSE BEVERLY NANNY, RN FOR REVIEW OF ALL PREOP TEST RESULTS.

## 2014-01-11 NOTE — Anesthesia Preprocedure Evaluation (Addendum)
Anesthesia Evaluation  Patient identified by MRN, date of birth, ID band Patient awake    Reviewed: Allergy & Precautions, H&P , NPO status , Patient's Chart, lab work & pertinent test results  Airway Mallampati: III  TM Distance: >3 FB Neck ROM: Full    Dental no notable dental hx. (+) Dental Advisory Given, Teeth Intact   Pulmonary sleep apnea and Continuous Positive Airway Pressure Ventilation , former smoker,  breath sounds clear to auscultation  Pulmonary exam normal       Cardiovascular hypertension, Pt. on medications Rhythm:Regular Rate:Normal     Neuro/Psych negative neurological ROS  negative psych ROS   GI/Hepatic Neg liver ROS, GERD-  Medicated and Controlled,  Endo/Other  diabetes, Type 2Morbid obesity  Renal/GU negative Renal ROS  negative genitourinary   Musculoskeletal  (+) Arthritis -, Osteoarthritis,    Abdominal   Peds negative pediatric ROS (+)  Hematology negative hematology ROS (+)   Anesthesia Other Findings   Reproductive/Obstetrics negative OB ROS                            Anesthesia Physical Anesthesia Plan  ASA: II  Anesthesia Plan: MAC   Post-op Pain Management:    Induction: Intravenous  Airway Management Planned: Nasal Cannula  Additional Equipment:   Intra-op Plan:   Post-operative Plan: Extubation in OR  Informed Consent: I have reviewed the patients History and Physical, chart, labs and discussed the procedure including the risks, benefits and alternatives for the proposed anesthesia with the patient or authorized representative who has indicated his/her understanding and acceptance.   Dental advisory given  Plan Discussed with: CRNA  Anesthesia Plan Comments:        Anesthesia Quick Evaluation

## 2014-01-12 ENCOUNTER — Ambulatory Visit: Payer: Self-pay | Admitting: Orthopedic Surgery

## 2014-01-12 DIAGNOSIS — M25669 Stiffness of unspecified knee, not elsewhere classified: Secondary | ICD-10-CM | POA: Diagnosis present

## 2014-01-12 DIAGNOSIS — Z96659 Presence of unspecified artificial knee joint: Secondary | ICD-10-CM

## 2014-01-12 DIAGNOSIS — T8489XA Other specified complication of internal orthopedic prosthetic devices, implants and grafts, initial encounter: Secondary | ICD-10-CM

## 2014-01-12 NOTE — H&P (Signed)
CC- Eric Richmond is a 73 y.o. male who presents with left knee pain.  HPI- . Knee Pain: Patient presents with stiffness involving the  left knee. Onset of the symptoms was several months ago. Inciting event: He had a left TKA several months ago and has had limited ROM despite adequate PT.. Current symptoms include stiffness. Pain is aggravated by going up and down stairs, lateral movements and rising after sitting.  Patient has had prior knee problems. Evaluation to date: PT evaluation which did not improve ROM. Treatment to date: PT which was not very effective.  Past Medical History  Diagnosis Date  . Unspecified essential hypertension   . GERD (gastroesophageal reflux disease)   . ED (erectile dysfunction)   . Hard of hearing   . Anal fissure 20 years ago  . Allergy   . OSA (obstructive sleep apnea)     per Dr. Gwenette Greet , cpap -2.0  . Pneumonia     spring 2015 - HAS NOT USED OR NEEDED INHALER SINCE   . Osteoarthritis     OA  RIGHT KNEE AND RIGHT THUMB;  S/P LEFT TOTAL KNEE REPLACEMENT    Past Surgical History  Procedure Laterality Date  . Knee arthroscopy      left knee  . Colonoscopy  06-11-13    per Dr. Hilarie Fredrickson, had 8 adenomatous polyps removed, repeat in 3 years   . Total knee arthroplasty Left 10/26/2013    Procedure: LEFT TOTAL KNEE ARTHROPLASTY;  Surgeon: Gearlean Alf, MD;  Location: [facility] ORS;  Service: Orthopedics;  Laterality: Left;  . Surgery age 64 or 7 on left knee for a "gland behind the knee"    . Anal fissure repair      Prior to Admission medications   Medication Sig Start Date End Date Taking? Authorizing Provider  aspirin EC 81 MG tablet Take 81 mg by mouth daily.   Yes Historical Provider, MD  atenolol (TENORMIN) 50 MG tablet Take 50 mg by mouth every morning.   Yes Historical Provider, MD  cetirizine (ZYRTEC) 10 MG tablet Take 10 mg by mouth daily as needed for allergies.   Yes Historical Provider, MD  losartan-hydrochlorothiazide (HYZAAR) 100-25 MG per  tablet Take 1 tablet by mouth every morning.   Yes Historical Provider, MD  Multiple Vitamin (MULTIVITAMIN WITH MINERALS) TABS tablet Take 1 tablet by mouth daily.   Yes Historical Provider, MD  omeprazole (PRILOSEC) 40 MG capsule Take 1 capsule (40 mg total) by mouth daily. 07/31/13  Yes [name], MD  potassium chloride SA (KLOR-CON M20) 20 MEQ tablet Take 1 tablet (20 mEq total) by mouth daily. 12/16/13  Yes [name], MD  PROAIR HFA 108 [phone] BASE) MCG/ACT inhaler Inhale 1 puff into the lungs every 6 (six) hours as needed for wheezing or shortness of breath (pt states he has not used in "months").  10/03/13  Yes Historical Provider, MD  rivaroxaban (XARELTO) 10 MG TABS tablet Take 1 tablet (10 mg total) by mouth daily. Take Xarelto for two and a half more weeks, then discontinue Xarelto. Once the patient has completed the blood thinner regimen, then take a Baby 81 mg Aspirin daily for three more weeks. Patient not taking: Reported on 01/12/2014 10/28/13   Arlee Muslim, PA-C  tadalafil (CIALIS) 20 MG tablet Take 1 tablet (20 mg total) by mouth daily as needed for erectile dysfunction. Patient not taking: Reported on 01/12/2014 12/02/13   [name], MD   KNEE EXAM antalgic  gait, no effusion, collateral ligaments intact, ROM 0-85  Physical Examination: General appearance - alert, well appearing, and in no distress Mental status - alert, oriented to person, place, and time Chest - clear to auscultation, no wheezes, rales or rhonchi, symmetric air entry Heart - normal rate, regular rhythm, normal S1, S2, no murmurs, rubs, clicks or gallops Abdomen - soft, nontender, nondistended, no masses or organomegaly Neurological - alert, oriented, normal speech, no focal findings or movement disorder noted   Asessment/Plan--- Left knee arthrofibrosis- - Plan left knee closed manipulation. Procedure risks and potential comps discussed with patient who elects to proceed. Goals are decreased pain and  increased function with a high likelihood of achieving both

## 2014-01-13 ENCOUNTER — Encounter (HOSPITAL_COMMUNITY)
Admission: RE | Disposition: A | Discharge status: Home / Self Care | Payer: Self-pay | Source: Ambulatory Visit | Attending: Orthopedic Surgery

## 2014-01-13 ENCOUNTER — Ambulatory Visit (HOSPITAL_COMMUNITY): Payer: Medicare Other | Admitting: Anesthesiology

## 2014-01-13 ENCOUNTER — Encounter (HOSPITAL_COMMUNITY): Payer: Self-pay | Admitting: *Deleted

## 2014-01-13 ENCOUNTER — Ambulatory Visit (HOSPITAL_COMMUNITY)
Admission: RE | Admit: 2014-01-13 | Discharge: 2014-01-13 | Disposition: A | Discharge status: Home / Self Care | Payer: Medicare Other | Source: Ambulatory Visit | Attending: Orthopedic Surgery | Admitting: Orthopedic Surgery

## 2014-01-13 DIAGNOSIS — N529 Male erectile dysfunction, unspecified: Secondary | ICD-10-CM | POA: Insufficient documentation

## 2014-01-13 DIAGNOSIS — K219 Gastro-esophageal reflux disease without esophagitis: Secondary | ICD-10-CM | POA: Diagnosis not present

## 2014-01-13 DIAGNOSIS — M25669 Stiffness of unspecified knee, not elsewhere classified: Secondary | ICD-10-CM | POA: Diagnosis present

## 2014-01-13 DIAGNOSIS — Z79899 Other long term (current) drug therapy: Secondary | ICD-10-CM | POA: Insufficient documentation

## 2014-01-13 DIAGNOSIS — M189 Osteoarthritis of first carpometacarpal joint, unspecified: Secondary | ICD-10-CM | POA: Insufficient documentation

## 2014-01-13 DIAGNOSIS — M24662 Ankylosis, left knee: Secondary | ICD-10-CM | POA: Diagnosis not present

## 2014-01-13 DIAGNOSIS — I10 Essential (primary) hypertension: Secondary | ICD-10-CM | POA: Insufficient documentation

## 2014-01-13 DIAGNOSIS — G4733 Obstructive sleep apnea (adult) (pediatric): Secondary | ICD-10-CM | POA: Insufficient documentation

## 2014-01-13 DIAGNOSIS — M179 Osteoarthritis of knee, unspecified: Secondary | ICD-10-CM | POA: Insufficient documentation

## 2014-01-13 DIAGNOSIS — Z96659 Presence of unspecified artificial knee joint: Secondary | ICD-10-CM

## 2014-01-13 DIAGNOSIS — T8489XA Other specified complication of internal orthopedic prosthetic devices, implants and grafts, initial encounter: Secondary | ICD-10-CM

## 2014-01-13 DIAGNOSIS — Z87891 Personal history of nicotine dependence: Secondary | ICD-10-CM | POA: Insufficient documentation

## 2014-01-13 DIAGNOSIS — Z7982 Long term (current) use of aspirin: Secondary | ICD-10-CM | POA: Insufficient documentation

## 2014-01-13 HISTORY — PX: KNEE CLOSED REDUCTION: SHX995

## 2014-01-13 SURGERY — MANIPULATION, KNEE, CLOSED
Anesthesia: Monitor Anesthesia Care | Site: Knee | Laterality: Left

## 2014-01-13 MED ORDER — SODIUM CHLORIDE 0.9 % IV SOLN: INTRAVENOUS | Status: DC

## 2014-01-13 MED ORDER — CEFAZOLIN SODIUM-DEXTROSE 2-3 GM-% IV SOLR
INTRAVENOUS | Status: AC
  Filled 2014-01-13: qty 50

## 2014-01-13 MED ORDER — DEXAMETHASONE SODIUM PHOSPHATE 10 MG/ML IJ SOLN: 10.0000 mg | Freq: Once | INTRAMUSCULAR | Status: DC

## 2014-01-13 MED ORDER — CEFAZOLIN SODIUM-DEXTROSE 2-3 GM-% IV SOLR: 2.0000 g | INTRAVENOUS | Status: DC

## 2014-01-13 MED ORDER — FENTANYL CITRATE 0.05 MG/ML IJ SOLN
INTRAMUSCULAR | Status: DC | PRN
  Administered 2014-01-13: 50 ug via INTRAVENOUS

## 2014-01-13 MED ORDER — HYDROCODONE-ACETAMINOPHEN 5-325 MG PO TABS: 1.0000 | ORAL_TABLET | Freq: Four times a day (QID) | ORAL | Status: DC | PRN

## 2014-01-13 MED ORDER — ONDANSETRON HCL 4 MG/2ML IJ SOLN: 4.0000 mg | Freq: Once | INTRAMUSCULAR | Status: DC | PRN

## 2014-01-13 MED ORDER — ACETAMINOPHEN 10 MG/ML IV SOLN
1000.0000 mg | Freq: Once | INTRAVENOUS | Status: AC
  Administered 2014-01-13: 1000 mg via INTRAVENOUS
  Filled 2014-01-13: qty 100

## 2014-01-13 MED ORDER — PROPOFOL 10 MG/ML IV BOLUS
INTRAVENOUS | Status: AC
  Filled 2014-01-13: qty 20

## 2014-01-13 MED ORDER — MIDAZOLAM HCL 2 MG/2ML IJ SOLN
INTRAMUSCULAR | Status: AC
  Filled 2014-01-13: qty 2

## 2014-01-13 MED ORDER — FENTANYL CITRATE 0.05 MG/ML IJ SOLN
INTRAMUSCULAR | Status: AC
  Filled 2014-01-13: qty 2

## 2014-01-13 MED ORDER — PROPOFOL 10 MG/ML IV BOLUS
INTRAVENOUS | Status: DC | PRN
  Administered 2014-01-13: 150 mg via INTRAVENOUS

## 2014-01-13 MED ORDER — CHLORHEXIDINE GLUCONATE 4 % EX LIQD: 60.0000 mL | Freq: Once | CUTANEOUS | Status: DC

## 2014-01-13 MED ORDER — LACTATED RINGERS IV SOLN
INTRAVENOUS | Status: DC
  Administered 2014-01-13: 1000 mL via INTRAVENOUS

## 2014-01-13 MED ORDER — FENTANYL CITRATE 0.05 MG/ML IJ SOLN: 25.0000 ug | INTRAMUSCULAR | Status: DC | PRN

## 2014-01-13 SURGICAL SUPPLY — 14 items
BANDAGE ADH SHEER 1  50/CT (GAUZE/BANDAGES/DRESSINGS) IMPLANT
GAUZE SPONGE 4X4 12PLY STRL (GAUZE/BANDAGES/DRESSINGS) IMPLANT
GLOVE BIO SURGEON STRL SZ7.5 (GLOVE) IMPLANT
GLOVE BIOGEL PI IND STRL 8 (GLOVE) IMPLANT
GLOVE BIOGEL PI INDICATOR 8 (GLOVE)
GOWN STRL REUS W/TWL LRG LVL3 (GOWN DISPOSABLE) IMPLANT
GOWN STRL REUS W/TWL XL LVL3 (GOWN DISPOSABLE) IMPLANT
MANIFOLD NEPTUNE II (INSTRUMENTS) IMPLANT
NDL SAFETY ECLIPSE 18X1.5 (NEEDLE) IMPLANT
NEEDLE HYPO 18GX1.5 SHARP (NEEDLE)
PADDING CAST COTTON 6X4 STRL (CAST SUPPLIES) IMPLANT
POSITIONER SURGICAL ARM (MISCELLANEOUS) IMPLANT
SYR CONTROL 10ML LL (SYRINGE) IMPLANT
TOWEL OR 17X26 10 PK STRL BLUE (TOWEL DISPOSABLE) IMPLANT

## 2014-01-13 NOTE — Anesthesia Postprocedure Evaluation (Signed)
  Anesthesia Post-op Note  Patient: Eric Richmond  Procedure(s) Performed: Procedure(s) (LRB): CLOSED MANIPULATION LEFT KNEE (Left)  Patient Location: PACU  Anesthesia Type: General  Level of Consciousness: awake and alert   Airway and Oxygen Therapy: Patient Spontanous Breathing  Post-op Pain: mild  Post-op Assessment: Post-op Vital signs reviewed, Patient's Cardiovascular Status Stable, Respiratory Function Stable, Patent Airway and No signs of Nausea or vomiting  Last Vitals:  Filed Vitals:   01/13/14 1753  BP:   Pulse: 69  Temp:   Resp: 16    Post-op Vital Signs: stable   Complications: No apparent anesthesia complications

## 2014-01-13 NOTE — Transfer of Care (Signed)
Immediate Anesthesia Transfer of Care Note  Patient: Eric Richmond  Procedure(s) Performed: Procedure(s): CLOSED MANIPULATION LEFT KNEE (Left)  Patient Location: PACU  Anesthesia Type:General  Level of Consciousness: awake, sedated and patient cooperative  Airway & Oxygen Therapy: Patient Spontanous Breathing and Patient connected to face mask oxygen  Post-op Assessment: Report given to PACU RN and Post -op Vital signs reviewed and stable  Post vital signs: Reviewed and stable  Complications: No apparent anesthesia complications

## 2014-01-13 NOTE — Interval H&P Note (Signed)
History and Physical Interval Note:  01/13/2014 5:20 PM  Eric Richmond  has presented today for surgery, with the diagnosis of ARTHRIBIFROSIS LEFT KNEE   The various methods of treatment have been discussed with the patient and family. After consideration of risks, benefits and other options for treatment, the patient has consented to  Procedure(s): CLOSED MANIPULATION LEFT KNEE (Left) as a surgical intervention .  The patient's history has been reviewed, patient examined, no change in status, stable for surgery.  I have reviewed the patient's chart and labs.  Questions were answered to the patient's satisfaction.     Gearlean Alf

## 2014-01-13 NOTE — Op Note (Signed)
  OPERATIVE REPORT   PREOPERATIVE DIAGNOSIS: Arthrofibrosis, Left  knee.   POSTOPERATIVE DIAGNOSIS: Arthrofibrosis, Left knee.   PROCEDURE:  Left  knee closed manipulation.   SURGEON: Chistopher Mangino, MD   ASSISTANT: None.   ANESTHESIA: General.   COMPLICATIONS: None.   CONDITION: Stable to Recovery.   Pre-manipulation range of motion is 5-90.  Post-manipulation range of  Motion is 0-125  PROCEDURE IN DETAIL: After successful administration of general  anesthetic, exam under anesthesia was performed showing range of motion  5-90 degrees. I then placed my chest against the proximal tibia,  flexing the knee with audible lysis of adhesions. I was easily able to  get the knee flexed to 125  degrees. I then put the knee back in extension and with some  patellar manipulation and gentle pressure got to full Extension.The patient was subsequently awakened and transported to Recovery in  stable condition.   

## 2014-01-14 ENCOUNTER — Encounter (HOSPITAL_COMMUNITY): Payer: Self-pay | Admitting: Orthopedic Surgery

## 2014-01-22 ENCOUNTER — Other Ambulatory Visit: Payer: Self-pay | Admitting: Family Medicine

## 2014-04-28 ENCOUNTER — Other Ambulatory Visit: Payer: Self-pay | Admitting: Family Medicine

## 2014-08-01 ENCOUNTER — Other Ambulatory Visit: Payer: Self-pay | Admitting: Family Medicine

## 2014-11-18 ENCOUNTER — Ambulatory Visit (INDEPENDENT_AMBULATORY_CARE_PROVIDER_SITE_OTHER): Payer: Medicare Other | Admitting: Family Medicine

## 2014-11-18 ENCOUNTER — Encounter: Payer: Self-pay | Admitting: Family Medicine

## 2014-11-18 ENCOUNTER — Telehealth: Payer: Self-pay | Admitting: Family Medicine

## 2014-11-18 VITALS — BP 129/76 | HR 51 | Temp 98.3°F | Ht 68.0 in | Wt 199.0 lb

## 2014-11-18 DIAGNOSIS — Z23 Encounter for immunization: Secondary | ICD-10-CM | POA: Diagnosis not present

## 2014-11-18 DIAGNOSIS — I10 Essential (primary) hypertension: Secondary | ICD-10-CM

## 2014-11-18 DIAGNOSIS — R9431 Abnormal electrocardiogram [ECG] [EKG]: Secondary | ICD-10-CM

## 2014-11-18 DIAGNOSIS — E119 Type 2 diabetes mellitus without complications: Secondary | ICD-10-CM

## 2014-11-18 DIAGNOSIS — Z0000 Encounter for general adult medical examination without abnormal findings: Secondary | ICD-10-CM

## 2014-11-18 LAB — POCT URINALYSIS DIPSTICK
BILIRUBIN UA: NEGATIVE
Glucose, UA: NEGATIVE
Ketones, UA: NEGATIVE
Leukocytes, UA: NEGATIVE
Nitrite, UA: NEGATIVE
SPEC GRAV UA: 1.02
Urobilinogen, UA: 1
pH, UA: 7.5

## 2014-11-18 LAB — LIPID PANEL
Cholesterol: 143 mg/dL (ref 0–200)
HDL: 47.3 mg/dL (ref >39.00)
LDL Cholesterol: 84 mg/dL (ref 0–99)
NonHDL: 95.36
Total CHOL/HDL Ratio: 3
Triglycerides: 58 mg/dL (ref 0.0–149.0)
VLDL: 11.6 mg/dL (ref 0.0–40.0)

## 2014-11-18 LAB — CBC WITH DIFFERENTIAL/PLATELET
BASOS PCT: 0.5 % (ref 0.0–3.0)
Basophils Absolute: 0 10*3/uL (ref 0.0–0.1)
EOS PCT: 3.2 % (ref 0.0–5.0)
Eosinophils Absolute: 0.2 10*3/uL (ref 0.0–0.7)
HCT: 43.5 % (ref 39.0–52.0)
Hemoglobin: 14.4 g/dL (ref 13.0–17.0)
Lymphocytes Relative: 30.6 % (ref 12.0–46.0)
Lymphs Abs: 2.2 10*3/uL (ref 0.7–4.0)
MCHC: 33.2 g/dL (ref 30.0–36.0)
MCV: 89.3 fl (ref 78.0–100.0)
MONO ABS: 0.5 10*3/uL (ref 0.1–1.0)
Monocytes Relative: 6.4 % (ref 3.0–12.0)
Neutro Abs: 4.3 10*3/uL (ref 1.4–7.7)
Neutrophils Relative %: 59.3 % (ref 43.0–77.0)
Platelets: 174 10*3/uL (ref 150.0–400.0)
RBC: 4.88 Mil/uL (ref 4.22–5.81)
RDW: 14.6 % (ref 11.5–15.5)
WBC: 7.2 10*3/uL (ref 4.0–10.5)

## 2014-11-18 LAB — TSH: TSH: 1.4 u[IU]/mL (ref 0.35–4.50)

## 2014-11-18 LAB — HEPATIC FUNCTION PANEL
ALK PHOS: 81 U/L (ref 39–117)
ALT: 12 U/L (ref 0–53)
AST: 20 U/L (ref 0–37)
Albumin: 4.1 g/dL (ref 3.5–5.2)
BILIRUBIN DIRECT: 0.2 mg/dL (ref 0.0–0.3)
TOTAL PROTEIN: 7 g/dL (ref 6.0–8.3)
Total Bilirubin: 1.1 mg/dL (ref 0.2–1.2)

## 2014-11-18 LAB — BASIC METABOLIC PANEL
BUN: 18 mg/dL (ref 6–23)
CO2: 32 mEq/L (ref 19–32)
Calcium: 9.6 mg/dL (ref 8.4–10.5)
Chloride: 102 mEq/L (ref 96–112)
Creatinine, Ser: 0.99 mg/dL (ref 0.40–1.50)
GFR: 94.94 mL/min (ref >60.00)
Glucose, Bld: 91 mg/dL (ref 70–99)
POTASSIUM: 4.5 meq/L (ref 3.5–5.1)
SODIUM: 140 meq/L (ref 135–145)

## 2014-11-18 LAB — HEMOGLOBIN A1C: Hgb A1c MFr Bld: 6.3 % (ref 4.6–6.5)

## 2014-11-18 LAB — PSA: PSA: 1.46 ng/mL (ref 0.10–4.00)

## 2014-11-18 NOTE — Telephone Encounter (Signed)
Pt can not remember why dr fry want him to see cardiologist. Pt wife would like sylvia to call her back

## 2014-11-18 NOTE — Progress Notes (Signed)
   Subjective:    Patient ID: Eric Richmond, male    DOB: [birth year], 74 y.o.   MRN: [id number]  HPI 74 yr old male for a well exam. He feels well. He exercises at the gym regularly. He denies any chest pain or SOB when he exercises. His BP at home is stable, but he has not checked his glucose for a long time.    Review of Systems  Constitutional: Negative.   HENT: Negative.   Eyes: Negative.   Respiratory: Negative.   Cardiovascular: Negative.   Gastrointestinal: Negative.   Genitourinary: Negative.   Musculoskeletal: Negative.   Skin: Negative.   Neurological: Negative.   Psychiatric/Behavioral: Negative.        Objective:   Physical Exam  Constitutional: He is oriented to person, place, and time. He appears well-developed and well-nourished. No distress.  HENT:  Head: Normocephalic and atraumatic.  Right Ear: External ear normal.  Left Ear: External ear normal.  Nose: Nose normal.  Mouth/Throat: Oropharynx is clear and moist. No oropharyngeal exudate.  Eyes: Conjunctivae and EOM are normal. Pupils are equal, round, and reactive to light. Right eye exhibits no discharge. Left eye exhibits no discharge. No scleral icterus.  Neck: Neck supple. No JVD present. No tracheal deviation present. No thyromegaly present.  Cardiovascular: Normal rate, regular rhythm, normal heart sounds and intact distal pulses.  Exam reveals no gallop and no friction rub.   No murmur heard. EKG shows new inferior T wave inversions   Pulmonary/Chest: Effort normal and breath sounds normal. No respiratory distress. He has no wheezes. He has no rales. He exhibits no tenderness.  Abdominal: Soft. Bowel sounds are normal. He exhibits no distension and no mass. There is no tenderness. There is no rebound and no guarding.  Genitourinary: Rectum normal, prostate normal and penis normal. Guaiac negative stool. No penile tenderness.  Musculoskeletal: Normal range of motion. He exhibits no edema or tenderness.    Lymphadenopathy:    He has no cervical adenopathy.  Neurological: He is alert and oriented to person, place, and time. He has normal reflexes. No cranial nerve deficit. He exhibits normal muscle tone. Coordination normal.  Skin: Skin is warm and dry. No rash noted. He is not diaphoretic. No erythema. No pallor.  Psychiatric: He has a normal mood and affect. His behavior is normal. Judgment and thought content normal.          Assessment & Plan:  Well exam. We discussed diet and exercise advice. Get fasting labs today including an A1c. He has new EKG changes which raise a concern for inferior ischemia, so we will refer to Cardiology to evaluate.

## 2014-11-19 NOTE — Telephone Encounter (Signed)
I left a voice message for pt with the below information.

## 2014-11-19 NOTE — Telephone Encounter (Signed)
Per [name] abnormal EKG.

## 2014-11-24 ENCOUNTER — Other Ambulatory Visit: Payer: Self-pay | Admitting: Family Medicine

## 2014-11-30 ENCOUNTER — Telehealth: Payer: Self-pay | Admitting: Family Medicine

## 2014-12-08 NOTE — Telephone Encounter (Signed)
I spoke with pt and advised him to contact his insurance company and find out what the problem is. He stated that he picked up other prescriptions and there was no problem. He does not have a mail order pharmacy at this time.

## 2014-12-08 NOTE — Telephone Encounter (Signed)
Eric Richmond , [facility] states the [employer] will not let them fill his  losartan-hydrochlorothiazide (HYZAAR) 100-25 MG per tablet at their pharmacy.  We sent in on 9/30.  Pharmacist states they have blocked them  from filling this rx, and they suggest it may be Because pt needs to do mailorder. The pharm is [facility]  Pt is confused and does not understand why he cannot get his meds. I am not sure how to help. Can you?  Pharmacy did confirm this is not a prior authorization

## 2014-12-14 ENCOUNTER — Encounter: Payer: Self-pay | Admitting: Cardiovascular Disease

## 2014-12-14 ENCOUNTER — Ambulatory Visit (INDEPENDENT_AMBULATORY_CARE_PROVIDER_SITE_OTHER): Payer: Medicare Other | Admitting: Cardiovascular Disease

## 2014-12-14 VITALS — BP 140/80 | HR 56 | Ht 68.0 in | Wt 214.3 lb

## 2014-12-14 DIAGNOSIS — I10 Essential (primary) hypertension: Secondary | ICD-10-CM | POA: Diagnosis not present

## 2014-12-14 DIAGNOSIS — R079 Chest pain, unspecified: Secondary | ICD-10-CM | POA: Diagnosis not present

## 2014-12-14 DIAGNOSIS — R9431 Abnormal electrocardiogram [ECG] [EKG]: Secondary | ICD-10-CM

## 2014-12-14 DIAGNOSIS — E785 Hyperlipidemia, unspecified: Secondary | ICD-10-CM

## 2014-12-14 MED ORDER — LOSARTAN POTASSIUM-HCTZ 100-25 MG PO TABS: 1.0000 | ORAL_TABLET | Freq: Every morning | ORAL | Status: DC

## 2014-12-14 NOTE — Progress Notes (Signed)
Cardiology Office Note   Date:  12/14/2014   ID:  Eric Richmond, DOB 1940-03-21, MRN [id number]  PCP:  [name], MD  Cardiologist:   Sharol Harness, MD   No chief complaint on file.     History of Present Illness: Eric Richmond is a 74 y.o. male with hypertension, diabetes mellitus type 2 who presents for an evaluation of EKG changes.  Eric Richmond went for a PCP appointment with Dr. Alysia Penna on 11/18/14 and was noted to have inferior t wave inversions that were concerning for ischemia.  He has been feeling well. He goes to the [facility] 3 times per week and walks for 2 miles. He also does strength training. He denies any chest pain or shortness of breath with these activities. He did have one episode of chest pain we'll doing arm weights. He denies any lower extremity edema, orthopnea or PND.  Eric Richmond uses a CPAP machine at night. His wife notes that he continues to have apneic episodes while on this. He endorses daytime somnolence and falls asleep very easily if he sits still during the day. He does not feel well rested when he wakes up in the morning. He started on CPAP several years ago and the settings have not been titrated.  His diet is healthy and portions are small.  He mostly drinks water but also orange juice and soda.  His weight has been stable.  He endorses eating some fried foods. mostly chicken and fish.  He does not eat many vegetables and his wife reports that he overeats sweets.  He previously smoked 2-3 cigarettes/day.  He has quit off and on and has not been smoking in a month.  He uses chewing tobacco one pouch every three days.    Past Medical History  Diagnosis Date  . Unspecified essential hypertension   . GERD (gastroesophageal reflux disease)   . ED (erectile dysfunction)   . Hard of hearing   . Anal fissure 20 years ago  . Allergy   . OSA (obstructive sleep apnea)     per Dr. Gwenette Greet , cpap -2.0  . Pneumonia     spring 2015 - HAS NOT USED  OR NEEDED INHALER SINCE   . Osteoarthritis     OA  RIGHT KNEE AND RIGHT THUMB;  S/P LEFT TOTAL KNEE REPLACEMENT    Past Surgical History  Procedure Laterality Date  . Knee arthroscopy      left knee  . Colonoscopy  06-11-13    per Dr. Hilarie Fredrickson, had 8 adenomatous polyps removed, repeat in 3 years   . Total knee arthroplasty Left 10/26/2013    Procedure: LEFT TOTAL KNEE ARTHROPLASTY;  Surgeon: Gearlean Alf, MD;  Location: [facility];  Service: Orthopedics;  Laterality: Left;  . Surgery age 59 or 7 on left knee for a "gland behind the knee"    . Anal fissure repair    . Knee closed reduction Left 01/13/2014    Procedure: CLOSED MANIPULATION LEFT KNEE;  Surgeon: Gearlean Alf, MD;  Location: [facility];  Service: Orthopedics;  Laterality: Left;     Current Outpatient Prescriptions  Medication Sig Dispense Refill  . aspirin EC 81 MG tablet Take 81 mg by mouth daily.    Marland Kitchen atenolol (TENORMIN) 50 MG tablet TAKE 1 TABLET BY MOUTH DAILY 90 tablet 3  . cetirizine (ZYRTEC) 10 MG tablet Take 10 mg by mouth daily as needed for allergies.    Marland Kitchen HYDROcodone-acetaminophen (  NORCO) 5-325 MG per tablet Take 1 tablet by mouth every 6 (six) hours as needed for moderate pain. 30 tablet 0  . losartan-hydrochlorothiazide (HYZAAR) 100-25 MG tablet Take 1 tablet by mouth every morning. 90 tablet 3  . Multiple Vitamin (MULTIVITAMIN WITH MINERALS) TABS tablet Take 1 tablet by mouth daily.    Marland Kitchen omeprazole (PRILOSEC) 40 MG capsule Take 1 capsule (40 mg total) by mouth daily. 90 capsule 3  . Potassium 99 MG TABS Take 99 mg by mouth daily.    Marland Kitchen PROAIR HFA 108 (90 BASE) MCG/ACT inhaler Inhale 1 puff into the lungs every 6 (six) hours as needed for wheezing or shortness of breath (pt states he has not used in "months").      No current facility-administered medications for this visit.    Allergies:   Oxycodone and Tramadol    Social History:  The patient  reports that he quit smoking about 37 years ago. His smoking use  included Cigarettes. He has a 6 pack-year smoking history. He has quit using smokeless tobacco. His smokeless tobacco use included Chew. He reports that he drinks alcohol. He reports that he does not use illicit drugs.   Family History:  The patient's family history includes Breast cancer in his sister; Diabetes in his brother, father, mother, sister, and another family member; Hypertension in his brother, father, mother, sister, and another family member; Thyroid cancer in his sister. There is no history of Colon cancer, Esophageal cancer, Rectal cancer, or Stomach cancer.    ROS:  Please see the history of present illness.   Otherwise, review of systems are positive for none.   All other systems are reviewed and negative.    PHYSICAL EXAM: VS:  BP 140/80 mmHg  Pulse 56  Ht 5\' 8"  (1.727 m)  Wt 97.206 kg (214 lb 4.8 oz)  BMI 32.59 kg/m2 , BMI Body mass index is 32.59 kg/(m^2). GENERAL:  Well appearing HEENT:  Pupils equal round and reactive, fundi not visualized, oral mucosa unremarkable NECK:  No jugular venous distention, waveform within normal limits, carotid upstroke brisk and symmetric, no bruits, no thyromegaly LYMPHATICS:  No cervical adenopathy LUNGS:  Clear to auscultation bilaterally HEART:  RRR.  PMI not displaced or sustained,S1 and S2 within normal limits, no S3, no S4, no clicks, no rubs, no murmurs ABD:  Flat, positive bowel sounds normal in frequency in pitch, no bruits, no rebound, no guarding, no midline pulsatile mass, no hepatomegaly, no splenomegaly EXT:  2 plus pulses throughout, no edema, no cyanosis no clubbing SKIN:  No rashes no nodules NEURO:  Cranial nerves II through XII grossly intact, motor grossly intact throughout PSYCH:  Cognitively intact, oriented to person place and time    EKG:  EKG is not ordered today.   Recent Labs: 11/18/2014: ALT 12; BUN 18; Creatinine, Ser 0.99; Hemoglobin 14.4; Platelets 174.0; Potassium 4.5; Sodium 140; TSH 1.40    Lipid  Panel    Component Value Date/Time   CHOL 143 11/18/2014 1120   TRIG 58.0 11/18/2014 1120   HDL 47.30 11/18/2014 1120   CHOLHDL 3 11/18/2014 1120   VLDL 11.6 11/18/2014 1120   LDLCALC 84 11/18/2014 1120      Wt Readings from Last 3 Encounters:  12/14/14 97.206 kg (214 lb 4.8 oz)  11/18/14 90.266 kg (199 lb)  01/13/14 93.895 kg (207 lb)      ASSESSMENT AND PLAN:  # Abnormal EKG: EKG showed inferior T wave inversions on 11/18/14. He does not  have any symptoms other than one episode of chest pain while lifting weights. This was most likely musculoskeletal. Given that he does have a diagnosis of diabetes it is possible that he has silent ischemia. Therefore we will refer him for exercise Cardiolite. Given that he has inferior T wave inversions the sensitivity and specificity of plain treadmill stress test is lower.  # Hypertension: Blood pressure well-controlled.  Continue atenolol, losartan, and hydrochlorothiazide.  # Hyperlipidemia: Lipids were recently checked by [name].  No changes recommended.   Current medicines are reviewed at length with the patient today.  The patient does not have concerns regarding medicines.  The following changes have been made:  no change  Labs/ tests ordered today include:   Orders Placed This Encounter  Procedures  . Myocardial Perfusion Imaging     Disposition:   FU with Sawyer Mentzer C. Oval Linsey, MD in 1 year.   Signed, Sharol Harness, MD  12/14/2014 12:16 PM    [place]

## 2014-12-14 NOTE — Patient Instructions (Signed)
Your physician has requested that you have en exercise stress myoview. For further information please visit [email or web address]. Please follow instruction sheet, as given.  Dr Oval Linsey recommends that you schedule a follow-up appointment in 1 year. You will receive a reminder letter in the mail two months in advance. If you don't receive a letter, please call our office to schedule the follow-up appointment.  If you need a refill on your cardiac medications before your next appointment, please call your pharmacy.

## 2014-12-16 ENCOUNTER — Telehealth (HOSPITAL_COMMUNITY): Payer: Self-pay

## 2014-12-16 NOTE — Telephone Encounter (Signed)
Encounter complete. 

## 2014-12-17 ENCOUNTER — Ambulatory Visit (HOSPITAL_COMMUNITY): Admission: RE | Admit: 2014-12-17 | Payer: Medicare Other | Source: Ambulatory Visit

## 2014-12-21 ENCOUNTER — Ambulatory Visit (INDEPENDENT_AMBULATORY_CARE_PROVIDER_SITE_OTHER): Payer: Medicare Other | Admitting: Internal Medicine

## 2014-12-21 ENCOUNTER — Encounter: Payer: Self-pay | Admitting: Internal Medicine

## 2014-12-21 VITALS — BP 140/88 | HR 63 | Ht 68.0 in | Wt 208.0 lb

## 2014-12-21 DIAGNOSIS — Z720 Tobacco use: Secondary | ICD-10-CM

## 2014-12-21 DIAGNOSIS — F17210 Nicotine dependence, cigarettes, uncomplicated: Secondary | ICD-10-CM

## 2014-12-21 DIAGNOSIS — G4733 Obstructive sleep apnea (adult) (pediatric): Secondary | ICD-10-CM | POA: Diagnosis not present

## 2014-12-21 DIAGNOSIS — Z6831 Body mass index (BMI) 31.0-31.9, adult: Secondary | ICD-10-CM | POA: Diagnosis not present

## 2014-12-21 NOTE — Progress Notes (Signed)
Subjective:    Patient ID: Eric Richmond, male    DOB: Aug 27, 1940,    MRN: [id number]  HPI   64 yobm  Quit smoking in his mid 30's then picked back up summer 2016 prev followed by [facility] with dx of osa and self referred back to pulmonary clinic 12/21/2014    12/21/2014 1st [place] office visit/ Eric Richmond   Chief Complaint  Patient presents with  . SLEEP CONSULT    Former [place] pt last seen in 2012 for OSA: pt currently  using CPAP every night for about 5 - 6 hours. pt wife states she can hear him almost as if he is choking while asleep even with the CPAP on. Epworth Score : 12 DME: [facility]  drowsiness not much different than baseline but maybe somewhat - note Epworth 18 on Dr Janifer Adie prev eval  Pt not aware of a problem but sometimes  does not feel rested in am and wife thinks he snores more on cpap then was the case 6 months ago. The patient also notices that the lights blink unusually on the machine and he has to unplug then plug it in again several times before all the lights go on correctly  No obvious   day to day or daytime variabilty or assoc sob or  chronic cough or cp or chest tightness, subjective wheeze overt sinus or hb symptoms. No unusual exp hx or h/o childhood pna/ asthma or knowledge of premature birth.  Also denies any obvious fluctuation of symptoms with weather or environmental changes or other aggravating or alleviating factors except as outlined above   Current Medications, Allergies, Complete Past Medical History, Past Surgical History, Family History, and Social History were reviewed in [employer] record.               Review of Systems  Constitutional: Negative for fever and unexpected weight change.  HENT: Positive for sneezing. Negative for congestion, dental problem, ear pain, nosebleeds, postnasal drip, rhinorrhea, sinus pressure, sore throat and trouble swallowing.   Eyes: Negative for redness and itching.  Respiratory: Negative for  cough, chest tightness, shortness of breath and wheezing.   Cardiovascular: Negative for palpitations and leg swelling.  Gastrointestinal: Negative for nausea and vomiting.  Genitourinary: Negative for dysuria.  Musculoskeletal: Negative for joint swelling.  Skin: Negative for rash.  Neurological: Negative for headaches.  Hematological: Does not bruise/bleed easily.  Psychiatric/Behavioral: Negative for dysphoric mood. The patient is not nervous/anxious.        Objective:   Physical Exam  Ambulatory wm nad  Wt Readings from Last 3 Encounters:  12/21/14 208 lb (94.348 kg)  12/14/14 214 lb 4.8 oz (97.206 kg)  11/18/14 199 lb (90.266 kg)    Vital signs reviewed  HEENT: nl dentition, turbinates, and orophanx with MI/II airway Nl external ear canals without cough reflex   NECK :  without JVD/Nodes/TM/ nl carotid upstrokes bilaterally   LUNGS: no acc muscle use, clear to A and P bilaterally without cough on insp or exp maneuvers   CV:  RRR  no s3 or murmur or increase in P2, no edema   ABD:  soft and nontender with nl excursion in the supine position. No bruits or organomegaly, bowel sounds nl  MS:  warm without deformities, calf tenderness, cyanosis or clubbing  SKIN: warm and dry without lesions    NEURO:  alert, approp, no deficits     I personally reviewed images and agree with radiology impression as  follows:  CXR:  11/07/13 Stable borderline cardiomegaly. No acute cardiopulmonary disease      Assessment & Plan:

## 2014-12-21 NOTE — Patient Instructions (Signed)
Please see patient coordinator before you leave today  to schedule service on your machine and reset it

## 2014-12-22 ENCOUNTER — Encounter: Payer: Self-pay | Admitting: Internal Medicine

## 2014-12-22 ENCOUNTER — Encounter (HOSPITAL_COMMUNITY): Payer: Medicare Other

## 2014-12-22 ENCOUNTER — Telehealth (HOSPITAL_COMMUNITY): Payer: Self-pay

## 2014-12-22 DIAGNOSIS — F17210 Nicotine dependence, cigarettes, uncomplicated: Secondary | ICD-10-CM | POA: Insufficient documentation

## 2014-12-22 NOTE — Assessment & Plan Note (Signed)
Complicated by HBP/ djd   Body mass index is 31.63    Lab Results  Component Value Date   TSH 1.40 11/18/2014     Contributing also to gerd risk/ osa/reviewed the need and the process to achieve and maintain neg calorie balance > defer f/u primary care including intermittently monitoring thyroid status

## 2014-12-22 NOTE — Assessment & Plan Note (Signed)
>   3 min discussion  I emphasized that although we never turn away smokers from the pulmonary clinic, we do ask that they understand that the recommendations that we make  won't work nearly as well in the presence of continued cigarette exposure.  In fact, we may very well  reach a point where we can't promise to help the patient if he/she can't quit smoking. (We can and will promise to try to help, we just can't promise what we recommend will really work)   Very unusual history in that he resumed smoking just in the fall of 2016 and then noticed problems with sleep about the same time. Strongly advised him to quit now since it couldn't be too much of a habit /nicotine addiction at this point

## 2014-12-22 NOTE — Telephone Encounter (Signed)
Encounter complete. 

## 2014-12-22 NOTE — Assessment & Plan Note (Signed)
NPSG 2005:  AHI 66/hr Titration study to optimal pressure 10cm  Auto 2012:  Optimal pressure 15cm - cpap autoset re-ordered 12/21/2014 with download per Dr Janee Morn rec   This case was discussed in detail with both the patient, his wife, and Dr. Annamaria Boots, a sleep specialist. There are several different issues here. One is whether his machine is actually working correctly or not but since it's been 4 years since his previous study and there is questions about whether it set probably the best thing to do is to have it serviced by advanced and then switched over to AutoSet until download is available to tweak it to an appropriate level for this patient  Total time devoted to counseling  = 35/20m review case with pt/ discussion of options/alternatives/ giving and going over instructions (see avs)

## 2014-12-24 ENCOUNTER — Ambulatory Visit (HOSPITAL_COMMUNITY)
Admission: RE | Admit: 2014-12-24 | Discharge: 2014-12-24 | Disposition: A | Discharge status: Home / Self Care | Payer: Medicare Other | Source: Ambulatory Visit | Attending: Cardiovascular Disease | Admitting: Cardiovascular Disease

## 2014-12-24 DIAGNOSIS — E119 Type 2 diabetes mellitus without complications: Secondary | ICD-10-CM | POA: Insufficient documentation

## 2014-12-24 DIAGNOSIS — R9431 Abnormal electrocardiogram [ECG] [EKG]: Secondary | ICD-10-CM | POA: Diagnosis not present

## 2014-12-24 DIAGNOSIS — I10 Essential (primary) hypertension: Secondary | ICD-10-CM | POA: Insufficient documentation

## 2014-12-24 DIAGNOSIS — G4733 Obstructive sleep apnea (adult) (pediatric): Secondary | ICD-10-CM | POA: Diagnosis not present

## 2014-12-24 DIAGNOSIS — Z87891 Personal history of nicotine dependence: Secondary | ICD-10-CM | POA: Diagnosis not present

## 2014-12-24 DIAGNOSIS — R079 Chest pain, unspecified: Secondary | ICD-10-CM | POA: Diagnosis not present

## 2014-12-24 LAB — MYOCARDIAL PERFUSION IMAGING
CHL CUP MPHR: 146 {beats}/min
CHL CUP NUCLEAR SDS: 0
CHL CUP RESTING HR STRESS: 45 {beats}/min
CHL RATE OF PERCEIVED EXERTION: 16
CSEPEDS: 40 s
CSEPHR: 86 %
Estimated workload: 9.5 METS
Exercise duration (min): 8 min
LV dias vol: 134 mL
LV sys vol: 63 mL
Peak HR: 126 {beats}/min
SRS: 1
SSS: 1
TID: 1.08

## 2014-12-24 MED ORDER — TECHNETIUM TC 99M SESTAMIBI GENERIC - CARDIOLITE
10.5000 | Freq: Once | INTRAVENOUS | Status: AC | PRN
  Administered 2014-12-24: 11 via INTRAVENOUS

## 2014-12-24 MED ORDER — TECHNETIUM TC 99M SESTAMIBI GENERIC - CARDIOLITE
31.4000 | Freq: Once | INTRAVENOUS | Status: AC | PRN
  Administered 2014-12-24: 31 via INTRAVENOUS

## 2014-12-30 ENCOUNTER — Telehealth: Payer: Self-pay | Admitting: Cardiovascular Disease

## 2014-12-30 NOTE — Telephone Encounter (Signed)
-----   Message from Skeet Latch, MD sent at 12/27/2014  3:56 PM EDT ----- Normal stress test.

## 2014-12-30 NOTE — Telephone Encounter (Signed)
Mrs. Eric Richmond is calling for Eric Richmond test results . Please call   Thanks

## 2014-12-30 NOTE — Telephone Encounter (Signed)
Spoke to patient's wife. Stress test Result given . Verbalized understanding

## 2015-01-14 ENCOUNTER — Other Ambulatory Visit: Payer: Self-pay | Admitting: Internal Medicine

## 2015-04-11 ENCOUNTER — Telehealth: Payer: Self-pay | Admitting: Family Medicine

## 2015-04-11 NOTE — Telephone Encounter (Signed)
Incoming fax received from [employer] requesting refills of the following meds:  losartan-hydrochlorothiazide (HYZAAR) 100-25 MG tablet atenolol (TENORMIN) 50 MG tablet omeprazole (PRILOSEC) 40 MG capsule

## 2015-04-11 NOTE — Telephone Encounter (Signed)
No, just the wife, but she thought she would go ahead and let you know about him as well.

## 2015-04-11 NOTE — Telephone Encounter (Signed)
Pt has changed insurance companies and would like to change his pharm to [facility].

## 2015-04-11 NOTE — Telephone Encounter (Signed)
I updated pharmacy in pt's chart, just make sure that pt does not need anything at this time?

## 2015-04-12 ENCOUNTER — Other Ambulatory Visit: Payer: Self-pay

## 2015-04-12 MED ORDER — OMEPRAZOLE 40 MG PO CPDR: 40.0000 mg | DELAYED_RELEASE_CAPSULE | Freq: Every day | ORAL | Status: DC

## 2015-04-12 MED ORDER — ATENOLOL 50 MG PO TABS: 50.0000 mg | ORAL_TABLET | Freq: Every day | ORAL | Status: DC

## 2015-04-12 MED ORDER — LOSARTAN POTASSIUM-HCTZ 100-25 MG PO TABS: 1.0000 | ORAL_TABLET | Freq: Every morning | ORAL | Status: DC

## 2015-04-12 NOTE — Telephone Encounter (Signed)
Rxs sent to [facility] 

## 2015-06-02 ENCOUNTER — Encounter: Payer: Self-pay | Admitting: Family Medicine

## 2015-06-02 ENCOUNTER — Ambulatory Visit (INDEPENDENT_AMBULATORY_CARE_PROVIDER_SITE_OTHER): Payer: Commercial Managed Care - HMO | Admitting: Family Medicine

## 2015-06-02 VITALS — BP 126/80 | HR 51 | Temp 97.7°F | Ht 68.0 in | Wt 202.0 lb

## 2015-06-02 DIAGNOSIS — Z23 Encounter for immunization: Secondary | ICD-10-CM

## 2015-06-02 DIAGNOSIS — Z0000 Encounter for general adult medical examination without abnormal findings: Secondary | ICD-10-CM

## 2015-06-02 DIAGNOSIS — E119 Type 2 diabetes mellitus without complications: Secondary | ICD-10-CM

## 2015-06-02 LAB — CBC WITH DIFFERENTIAL/PLATELET
BASOS ABS: 0 10*3/uL (ref 0.0–0.1)
Basophils Relative: 0.3 % (ref 0.0–3.0)
Eosinophils Absolute: 0.2 10*3/uL (ref 0.0–0.7)
Eosinophils Relative: 3 % (ref 0.0–5.0)
HCT: 42 % (ref 39.0–52.0)
Hemoglobin: 14.1 g/dL (ref 13.0–17.0)
LYMPHS ABS: 2.1 10*3/uL (ref 0.7–4.0)
Lymphocytes Relative: 26.9 % (ref 12.0–46.0)
MCHC: 33.7 g/dL (ref 30.0–36.0)
MCV: 87.9 fl (ref 78.0–100.0)
MONOS PCT: 5.7 % (ref 3.0–12.0)
Monocytes Absolute: 0.4 10*3/uL (ref 0.1–1.0)
NEUTROS PCT: 64.1 % (ref 43.0–77.0)
Neutro Abs: 5 10*3/uL (ref 1.4–7.7)
Platelets: 179 10*3/uL (ref 150.0–400.0)
RBC: 4.78 Mil/uL (ref 4.22–5.81)
RDW: 14 % (ref 11.5–15.5)
WBC: 7.7 10*3/uL (ref 4.0–10.5)

## 2015-06-02 LAB — POC URINALSYSI DIPSTICK (AUTOMATED)
Bilirubin, UA: NEGATIVE
Glucose, UA: NEGATIVE
Ketones, UA: NEGATIVE
LEUKOCYTES UA: NEGATIVE
NITRITE UA: NEGATIVE
PROTEIN UA: NEGATIVE
Spec Grav, UA: 1.015
UROBILINOGEN UA: 0.2
pH, UA: 7.5

## 2015-06-02 LAB — LIPID PANEL
CHOL/HDL RATIO: 3
Cholesterol: 161 mg/dL (ref 0–200)
HDL: 49.7 mg/dL (ref >39.00)
LDL CALC: 95 mg/dL (ref 0–99)
NONHDL: 111.04
Triglycerides: 82 mg/dL (ref 0.0–149.0)
VLDL: 16.4 mg/dL (ref 0.0–40.0)

## 2015-06-02 LAB — BASIC METABOLIC PANEL
BUN: 16 mg/dL (ref 6–23)
CALCIUM: 9.6 mg/dL (ref 8.4–10.5)
CO2: 29 mEq/L (ref 19–32)
CREATININE: 0.96 mg/dL (ref 0.40–1.50)
Chloride: 105 mEq/L (ref 96–112)
GFR: 98.23 mL/min (ref >60.00)
Glucose, Bld: 106 mg/dL — ABNORMAL HIGH (ref 70–99)
Potassium: 3.9 mEq/L (ref 3.5–5.1)
SODIUM: 140 meq/L (ref 135–145)

## 2015-06-02 LAB — TSH: TSH: 1.43 u[IU]/mL (ref 0.35–4.50)

## 2015-06-02 LAB — HEPATIC FUNCTION PANEL
ALT: 12 U/L (ref 0–53)
AST: 20 U/L (ref 0–37)
Albumin: 4.1 g/dL (ref 3.5–5.2)
Alkaline Phosphatase: 89 U/L (ref 39–117)
BILIRUBIN DIRECT: 0.2 mg/dL (ref 0.0–0.3)
BILIRUBIN TOTAL: 1.5 mg/dL — AB (ref 0.2–1.2)
Total Protein: 6.8 g/dL (ref 6.0–8.3)

## 2015-06-02 LAB — HEMOGLOBIN A1C: Hgb A1c MFr Bld: 6.5 % (ref 4.6–6.5)

## 2015-06-02 LAB — PSA: PSA: 1.21 ng/mL (ref 0.10–4.00)

## 2015-06-02 NOTE — Progress Notes (Signed)
   Subjective:    Patient ID: Eric Richmond, male    DOB: 03-28-1940, 74 y.o.   MRN: [id number]  HPI 75 yr old male for a well exam. He feels well and has no concerns. He works out 3 days a week.    Review of Systems  Constitutional: Negative.   HENT: Negative.   Eyes: Negative.   Respiratory: Negative.   Cardiovascular: Negative.   Gastrointestinal: Negative.   Genitourinary: Negative.   Musculoskeletal: Negative.   Skin: Negative.   Neurological: Negative.   Psychiatric/Behavioral: Negative.        Objective:   Physical Exam  Constitutional: He is oriented to person, place, and time. He appears well-developed and well-nourished. No distress.  HENT:  Head: Normocephalic and atraumatic.  Right Ear: External ear normal.  Left Ear: External ear normal.  Nose: Nose normal.  Mouth/Throat: Oropharynx is clear and moist. No oropharyngeal exudate.  Eyes: Conjunctivae and EOM are normal. Pupils are equal, round, and reactive to light. Right eye exhibits no discharge. Left eye exhibits no discharge. No scleral icterus.  Neck: Neck supple. No JVD present. No tracheal deviation present. No thyromegaly present.  Cardiovascular: Normal rate, regular rhythm, normal heart sounds and intact distal pulses.  Exam reveals no gallop and no friction rub.   No murmur heard. Pulmonary/Chest: Effort normal and breath sounds normal. No respiratory distress. He has no wheezes. He has no rales. He exhibits no tenderness.  Abdominal: Soft. Bowel sounds are normal. He exhibits no distension and no mass. There is no tenderness. There is no rebound and no guarding.  Genitourinary: Rectum normal, prostate normal and penis normal. Guaiac negative stool. No penile tenderness.  Musculoskeletal: Normal range of motion. He exhibits no edema or tenderness.  Lymphadenopathy:    He has no cervical adenopathy.  Neurological: He is alert and oriented to person, place, and time. He has normal reflexes. No cranial  nerve deficit. He exhibits normal muscle tone. Coordination normal.  Skin: Skin is warm and dry. No rash noted. He is not diaphoretic. No erythema. No pallor.  Psychiatric: He has a normal mood and affect. His behavior is normal. Judgment and thought content normal.          Assessment & Plan:  Well exam. We discussed diet and exercise. Get fasting labs.  [name], MD

## 2015-06-02 NOTE — Progress Notes (Signed)
Pre visit review using our clinic review tool, if applicable. No additional management support is needed unless otherwise documented below in the visit note. 

## 2015-07-30 DIAGNOSIS — J209 Acute bronchitis, unspecified: Secondary | ICD-10-CM | POA: Diagnosis not present

## 2015-08-02 DIAGNOSIS — H524 Presbyopia: Secondary | ICD-10-CM | POA: Diagnosis not present

## 2015-08-02 DIAGNOSIS — H5210 Myopia, unspecified eye: Secondary | ICD-10-CM | POA: Diagnosis not present

## 2015-09-26 ENCOUNTER — Telehealth: Payer: Self-pay | Admitting: Internal Medicine

## 2015-09-26 DIAGNOSIS — G4733 Obstructive sleep apnea (adult) (pediatric): Secondary | ICD-10-CM

## 2015-09-26 NOTE — Telephone Encounter (Signed)
Spoke with the pt's spouse  She states pt needing a new CPAP mask Order sent to [facility]  Nothing further needed

## 2015-10-12 DIAGNOSIS — G4733 Obstructive sleep apnea (adult) (pediatric): Secondary | ICD-10-CM | POA: Diagnosis not present

## 2015-10-12 DIAGNOSIS — M179 Osteoarthritis of knee, unspecified: Secondary | ICD-10-CM | POA: Diagnosis not present

## 2015-10-21 DIAGNOSIS — H903 Sensorineural hearing loss, bilateral: Secondary | ICD-10-CM | POA: Diagnosis not present

## 2015-12-30 ENCOUNTER — Ambulatory Visit (INDEPENDENT_AMBULATORY_CARE_PROVIDER_SITE_OTHER): Payer: Commercial Managed Care - HMO

## 2015-12-30 DIAGNOSIS — Z23 Encounter for immunization: Secondary | ICD-10-CM

## 2016-03-16 ENCOUNTER — Encounter: Payer: Self-pay | Admitting: *Deleted

## 2016-04-02 ENCOUNTER — Encounter: Payer: Self-pay | Admitting: Internal Medicine

## 2016-04-09 ENCOUNTER — Other Ambulatory Visit: Payer: Self-pay | Admitting: Family Medicine

## 2016-04-23 DIAGNOSIS — R69 Illness, unspecified: Secondary | ICD-10-CM | POA: Diagnosis not present

## 2016-04-30 ENCOUNTER — Encounter: Payer: Self-pay | Admitting: Internal Medicine

## 2016-05-14 DIAGNOSIS — R69 Illness, unspecified: Secondary | ICD-10-CM | POA: Diagnosis not present

## 2016-06-18 ENCOUNTER — Ambulatory Visit (AMBULATORY_SURGERY_CENTER): Payer: Self-pay | Admitting: *Deleted

## 2016-06-18 VITALS — Ht 68.0 in | Wt 204.0 lb

## 2016-06-18 DIAGNOSIS — Z86010 Personal history of colonic polyps: Secondary | ICD-10-CM

## 2016-06-18 MED ORDER — NA SULFATE-K SULFATE-MG SULF 17.5-3.13-1.6 GM/177ML PO SOLN: ORAL | 0 refills | Status: DC

## 2016-06-18 NOTE — Progress Notes (Signed)
Patient denies any allergies to eggs or soy. Patient denies any problems with anesthesia/sedation. Patient denies any oxygen use at home and does not take any diet/weight loss medications. NO EMAIL per pt.

## 2016-06-19 ENCOUNTER — Encounter: Payer: Self-pay | Admitting: Internal Medicine

## 2016-06-25 ENCOUNTER — Other Ambulatory Visit: Payer: Self-pay | Admitting: Family Medicine

## 2016-07-03 ENCOUNTER — Encounter: Payer: Commercial Managed Care - HMO | Admitting: Internal Medicine

## 2016-07-30 ENCOUNTER — Telehealth: Payer: Self-pay | Admitting: Internal Medicine

## 2016-07-30 MED ORDER — SUPREP BOWEL PREP KIT 17.5-3.13-1.6 GM/177ML PO SOLN: 1.0000 | Freq: Once | ORAL | 0 refills | Status: AC

## 2016-08-07 ENCOUNTER — Other Ambulatory Visit: Payer: Self-pay | Admitting: Internal Medicine

## 2016-08-07 ENCOUNTER — Encounter: Payer: Self-pay | Admitting: Internal Medicine

## 2016-08-07 ENCOUNTER — Ambulatory Visit (AMBULATORY_SURGERY_CENTER): Payer: Medicare HMO | Admitting: Internal Medicine

## 2016-08-07 ENCOUNTER — Telehealth: Payer: Self-pay | Admitting: Internal Medicine

## 2016-08-07 VITALS — BP 110/70 | HR 55 | Temp 98.6°F | Resp 12 | Ht 68.0 in | Wt 202.0 lb

## 2016-08-07 DIAGNOSIS — D123 Benign neoplasm of transverse colon: Secondary | ICD-10-CM

## 2016-08-07 DIAGNOSIS — Z86010 Personal history of colonic polyps: Secondary | ICD-10-CM | POA: Diagnosis present

## 2016-08-07 DIAGNOSIS — K635 Polyp of colon: Secondary | ICD-10-CM

## 2016-08-07 HISTORY — PX: OTHER SURGICAL HISTORY: SHX169

## 2016-08-07 MED ORDER — SODIUM CHLORIDE 0.9 % IV SOLN: 500.0000 mL | INTRAVENOUS | Status: DC

## 2016-08-07 NOTE — Telephone Encounter (Signed)
Wife wants to know if he can take his medications this morning- instructed all before 11 am- she verbalized understanding of this--marie Rooney Gladwin RN

## 2016-08-07 NOTE — Progress Notes (Signed)
Called to room to assist during endoscopic procedure.  Patient ID and intended procedure confirmed with present staff. Received instructions for my participation in the procedure from the performing physician.  

## 2016-08-07 NOTE — Progress Notes (Signed)
Spontaneous respirations throughout. VSS. Resting comfortably. To PACU on room air. Report to  Michelle RN. 

## 2016-08-07 NOTE — Op Note (Signed)
[facility] Patient Name: Eric Richmond Procedure Date: 08/07/2016 2:05 PM MRN: [id number] Endoscopist: Jerene Bears , MD Age: 76 Referring MD:  Date of Birth: 1940/12/04 Gender: Male Account #: [id number] Procedure:                Colonoscopy Indications:              Surveillance: Personal history of adenomatous                            polyps on last colonoscopy 3 years ago Medicines:                Monitored Anesthesia Care Procedure:                Pre-Anesthesia Assessment:                           - Prior to the procedure, a History and Physical                            was performed, and patient medications and                            allergies were reviewed. The patient's tolerance of                            previous anesthesia was also reviewed. The risks                            and benefits of the procedure and the sedation                            options and risks were discussed with the patient.                            All questions were answered, and informed consent                            was obtained. Prior Anticoagulants: The patient has                            taken no previous anticoagulant or antiplatelet                            agents. ASA Grade Assessment: III - A patient with                            severe systemic disease. After reviewing the risks                            and benefits, the patient was deemed in                            satisfactory condition to undergo the procedure.  After obtaining informed consent, the colonoscope                            was passed under direct vision. Throughout the                            procedure, the patient's blood pressure, pulse, and                            oxygen saturations were monitored continuously. The                            Colonoscope was introduced through the anus and                            advanced to the the cecum,  identified by                            appendiceal orifice and ileocecal valve. The                            colonoscopy was performed without difficulty. The                            patient tolerated the procedure well. The quality                            of the bowel preparation was good. The ileocecal                            valve, appendiceal orifice, and rectum were                            photographed. Scope In: 2:06:58 PM Scope Out: 2:26:06 PM Scope Withdrawal Time: 0 hours 14 minutes 13 seconds  Total Procedure Duration: 0 hours 19 minutes 8 seconds  Findings:                 The perianal examination was normal.                           A 10 mm polyp was found in the hepatic flexure. The                            polyp was semi-pedunculated. The polyp was removed                            with a hot snare. Resection and retrieval were                            complete.                           Two sessile polyps were found in the transverse  colon. The polyps were 4 to 6 mm in size. These                            polyps were removed with a cold snare. Resection                            and retrieval were complete.                           Many small and large-mouthed diverticula were found                            from ascending colon to sigmoid colon.                           Internal hemorrhoids were found during                            retroflexion. The hemorrhoids were small. Complications:            No immediate complications. Estimated Blood Loss:     Estimated blood loss was minimal. Impression:               - One 10 mm polyp at the hepatic flexure, removed                            with a hot snare. Resected and retrieved.                           - Two 4 to 6 mm polyps in the transverse colon,                            removed with a cold snare. Resected and retrieved.                           - Severe  diverticulosis from ascending colon to                            sigmoid colon.                           - Internal hemorrhoids. Recommendation:           - Patient has a contact number available for                            emergencies. The signs and symptoms of potential                            delayed complications were discussed with the                            patient. Return to normal activities tomorrow.  Written discharge instructions were provided to the                            patient.                           - Resume previous diet.                           - Continue present medications.                           - Await pathology results.                           - Repeat colonoscopy is recommended for                            surveillance. The colonoscopy date will be                            determined after pathology results from today's                            exam become available for review. Jerene Bears, MD 08/07/2016 2:30:32 PM This report has been signed electronically.

## 2016-08-07 NOTE — Patient Instructions (Signed)
YOU HAD AN ENDOSCOPIC PROCEDURE TODAY AT THE [facility]:   Refer to the procedure report that was given to you for any specific questions about what was found during the examination.  If the procedure report does not answer your questions, please call your gastroenterologist to clarify.  If you requested that your care partner not be given the details of your procedure findings, then the procedure report has been included in a sealed envelope for you to review at your convenience later.  YOU SHOULD EXPECT: Some feelings of bloating in the abdomen. Passage of more gas than usual.  Walking can help get rid of the air that was put into your GI tract during the procedure and reduce the bloating. If you had a lower endoscopy (such as a colonoscopy or flexible sigmoidoscopy) you may notice spotting of blood in your stool or on the toilet paper. If you underwent a bowel prep for your procedure, you may not have a normal bowel movement for a few days.  Please Note:  You might notice some irritation and congestion in your nose or some drainage.  This is from the oxygen used during your procedure.  There is no need for concern and it should clear up in a day or so.  SYMPTOMS TO REPORT IMMEDIATELY:   Following lower endoscopy (colonoscopy or flexible sigmoidoscopy):  Excessive amounts of blood in the stool  Significant tenderness or worsening of abdominal pains  Swelling of the abdomen that is new, acute  Fever of 100F or higher  For urgent or emergent issues, a gastroenterologist can be reached at any hour by calling [phone].   DIET:  We do recommend a small meal at first, but then you may proceed to your regular diet.  Drink plenty of fluids but you should avoid alcoholic beverages for 24 hours.  ACTIVITY:  You should plan to take it easy for the rest of today and you should NOT DRIVE or use heavy machinery until tomorrow (because of the sedation medicines used during the test).     FOLLOW UP: Our staff will call the number listed on your records the next business day following your procedure to check on you and address any questions or concerns that you may have regarding the information given to you following your procedure. If we do not reach you, we will leave a message.  However, if you are feeling well and you are not experiencing any problems, there is no need to return our call.  We will assume that you have returned to your regular daily activities without incident.  If any biopsies were taken you will be contacted by phone or by letter within the next 1-3 weeks.  Please call us at [phone] if you have not heard about the biopsies in 3 weeks.   Await for biopsy results to determine next repeat Colonoscopy screening Polyps (handout given) Diverticulosis (handout given)   SIGNATURES/CONFIDENTIALITY: You and/or your care partner have signed paperwork which will be entered into your electronic medical record.  These signatures attest to the fact that that the information above on your After Visit Summary has been reviewed and is understood.  Full responsibility of the confidentiality of this discharge information lies with you and/or your care-partner. 

## 2016-08-08 ENCOUNTER — Telehealth: Payer: Self-pay

## 2016-08-08 NOTE — Progress Notes (Signed)
Subjective:   Eric Richmond is a 76 y.o. male who presents for an Initial Medicare Annual Wellness Visit. He is here today with his wife.  The Patient was informed that the wellness visit is to identify future health risk and educate and initiate measures that can reduce risk for increased disease through the lifespan.   Describes health as fair, good or great? "With the medication, if it's working, I would say great for my age."  He endorses that he doesn't remember things like he used to. His wife also states he is more forgetful. He has not had any problems with driving directions, but feels like he has to think about where he is going more than he used to.  He is still working 3 days/week in [place] and feels like this helps stimulate his brain.  Review of Systems  No ROS.  Medicare Wellness Visit. Additional risk factors are reflected in the social history.  Cardiac Risk Factors include: advanced age (>61men, >1 women);hypertension;male gender  Sleep patterns: no sleep issues and gets up 0-1 times nightly to void. Wears CPAP nightly.  Home Safety/Smoke Alarms: Feels safe in home. Smoke alarms in place.  Living environment; residence and Firearm Safety: Lives w/ wife. 1-story house/ trailer, tub-shower, firearms stored safely. Seat Belt Safety/Bike Helmet: Wears seat belt.   Counseling:   Eye Exam- Follows w/ eye doctor at [facility] in [place]. Last appt >1 year ago. Dental- Follows w/ Dr. Nicki Reaper in [place] yearly  Male:   [facility]- last 08/07/16. 3 polyps removed, severe diverticulosis, internal hemorrhoids. Recall pending pathology results. PSA-  Lab Results  Component Value Date   PSA 1.21 06/02/2015   PSA 1.46 11/18/2014   PSA 1.63 03/18/2013      Objective:    Today's Vitals   08/09/16 1618  BP: 98/68  Pulse: (!) 50  SpO2: 98%  Weight: 196 lb 11.2 oz (89.2 kg)  Height: 5\' 8"  (1.727 m)   Body mass index is 29.91 kg/m.  BP Readings from Last 3  Encounters:  08/09/16 98/68  08/07/16 110/70  06/02/15 126/80    Pulse Readings from Last 3 Encounters:  08/09/16 (!) 50  08/07/16 (!) 55  06/02/15 (!) 51   Current Medications (verified) Outpatient Encounter Prescriptions as of 08/09/2016  Medication Sig  . aspirin EC 81 MG tablet Take 81 mg by mouth daily.  Marland Kitchen atenolol (TENORMIN) 50 MG tablet TAKE 1 TABLET EVERY DAY  . losartan-hydrochlorothiazide (HYZAAR) 100-25 MG tablet TAKE 1 TABLET EVERY MORNING  . Multiple Vitamin (MULTIVITAMIN WITH MINERALS) TABS tablet Take 1 tablet by mouth daily.  Marland Kitchen omeprazole (PRILOSEC) 40 MG capsule TAKE 1 CAPSULE EVERY DAY  . Potassium 99 MG TABS Take 99 mg by mouth daily.  Marland Kitchen PROAIR HFA 108 (90 BASE) MCG/ACT inhaler Inhale 1 puff into the lungs every 6 (six) hours as needed for wheezing or shortness of breath (pt states he has not used in "months").   . [DISCONTINUED] Na Sulfate-K Sulfate-Mg Sulf 17.5-3.13-1.6 GM/180ML SOLN Suprep (no substitutions)-TAKE AS DIRECTED.  . [DISCONTINUED] 0.9 %  sodium chloride infusion    No facility-administered encounter medications on file as of 08/09/2016.     Allergies (verified) Oxycodone and Tramadol   History: Past Medical History:  Diagnosis Date  . Allergy   . Anal fissure 20 years ago  . ED (erectile dysfunction)   . GERD (gastroesophageal reflux disease)   . Hard of hearing   . OSA (obstructive sleep apnea)  per Dr. Gwenette Greet , cpap -2.0  . Osteoarthritis    OA  RIGHT KNEE AND RIGHT THUMB;  S/P LEFT TOTAL KNEE REPLACEMENT  . Pneumonia    spring 2015 - HAS NOT USED OR NEEDED INHALER SINCE   . Sleep apnea    uses CPAP  . Unspecified essential hypertension    Past Surgical History:  Procedure Laterality Date  . ANAL FISSURE REPAIR    . colonoscopy  06-11-13   per Dr. Hilarie Fredrickson, had 8 adenomatous polyps removed, repeat in 3 years   . KNEE ARTHROSCOPY     left knee  . KNEE CLOSED REDUCTION Left 01/13/2014   Procedure: CLOSED MANIPULATION LEFT KNEE;   Surgeon: Gearlean Alf, MD;  Location: [facility];  Service: Orthopedics;  Laterality: Left;  . SURGERY AGE 68 OR 7 ON LEFT KNEE FOR A "GLAND BEHIND THE KNEE"    . TOTAL KNEE ARTHROPLASTY Left 10/26/2013   Procedure: LEFT TOTAL KNEE ARTHROPLASTY;  Surgeon: Gearlean Alf, MD;  Location: [facility];  Service: Orthopedics;  Laterality: Left;   Family History  Problem Relation Age of Onset  . Breast cancer Sister   . Diabetes Sister   . Hypertension Sister   . Thyroid cancer Sister   . Diabetes Mother   . Hypertension Mother   . Diabetes Father   . Hypertension Father   . Diabetes Sister   . Hypertension Unknown   . Diabetes Brother   . Hypertension Brother   . Heart failure Brother   . Heart failure Brother   . Diabetes Sister   . Colon cancer Neg Hx   . Esophageal cancer Neg Hx   . Rectal cancer Neg Hx   . Stomach cancer Neg Hx    Social History   Occupational History  . Fitter    Social History Main Topics  . Smoking status: Former Smoker    Packs/day: 0.30    Years: 20.00    Types: Cigarettes    Quit date: 02/26/1977  . Smokeless tobacco: Former Systems developer    Types: Chew     Comment: occ  . Alcohol use No  . Drug use: No  . Sexual activity: Not on file   Tobacco Counseling Counseling given: Not Answered   Activities of Daily Living In your present state of health, do you have any difficulty performing the following activities: 08/09/2016  Hearing? N  Vision? N  Difficulty concentrating or making decisions? N  Walking or climbing stairs? N  Dressing or bathing? N  Doing errands, shopping? N  Preparing Food and eating ? N  Using the Toilet? N  Managing your Medications? N  Managing your Finances? N  Housekeeping or managing your Housekeeping? N  Some recent data might be hidden    Immunizations and Health Maintenance Immunization History  Administered Date(s) Administered  . Influenza Split 01/10/2013  . Influenza Whole 11/21/2009, 01/17/2010  . Influenza, High  Dose Seasonal PF 11/18/2014, 12/30/2015  . Influenza,inj,Quad PF,36+ Mos 12/15/2013  . Pneumococcal Conjugate-13 12/15/2013  . Pneumococcal Polysaccharide-23 06/02/2015   Health Maintenance Due  Topic Date Due  . FOOT EXAM  07[birth year]09/1950  . OPHTHALMOLOGY EXAM  07/09/1950  . TETANUS/TDAP  07/09/1959  . HEMOGLOBIN A1C  12/02/2015    Patient Care Team: [name], MD as PCP - General  Indicate any recent Medical Services you may have received from other than [facility] providers in the past year (date may be approximate).    Assessment:   This is  a routine wellness examination for Jamicheal. Physical assessment deferred to PCP.  Hearing/Vision screen Hearing Screening Comments: Has bilateral hearing aids, but is not wearing them today and is noticeably hard of hearing. He follows w/ [facility] and plans to take his hearing aids in for adjustment soon.  Dietary issues and exercise activities discussed: Current Exercise Habits: Structured exercise class, Type of exercise: treadmill;strength training/weights, Frequency (Times/Week): 3  Diet (meal preparation, eat out, water intake, caffeinated beverages, dairy products, fruits and vegetables): in general, a "healthy" diet  , well balanced, on average, 3 meals per day. 'I'm trying to not eat like a hog.' Tries to minimize sweets, but does sometimes get cookies and ice cream during the week. Wife prepares most meals at home. Drinks water throughout the day.  Goals    . Spend more time traveling.      Depression Screen PHQ 2/9 Scores 08/09/2016 11/18/2014  PHQ - 2 Score 1 0    Fall Risk Fall Risk  08/09/2016 11/18/2014  Falls in the past year? No No    Cognitive Function: MMSE - Mini Mental State Exam 08/09/2016  Orientation to time 4  Orientation to time comments Disoriented to date.  Orientation to Place 5  Registration 3  Attention/ Calculation 4  Recall 1  Language- name 2 objects 2  Language- repeat 1  Language- follow 3 step  command 3  Language- read & follow direction 1  Write a sentence 1  Copy design 0  Total score 25        Screening Tests Health Maintenance  Topic Date Due  . FOOT EXAM  [birth year]09/1950  . OPHTHALMOLOGY EXAM  07/09/1950  . TETANUS/TDAP  07/09/1959  . HEMOGLOBIN A1C  12/02/2015  . INFLUENZA VACCINE  09/26/2016  . COLONOSCOPY  08/08/2019  . PNA vac Low Risk Adult  Completed        Plan:    Follow-up w/ PCP as scheduled for CPE in July.  MMSE borderline today-follow-up w/ PCP.  Create and bring a copy of your advance directives to your next office visit.   Schedule routine diabetic eye exam at pt convenience.   I have personally reviewed and noted the following in the patient's chart:   . Medical and social history . Use of alcohol, tobacco or illicit drugs  . Current medications and supplements . Functional ability and status . Nutritional status . Physical activity . Advanced directives . List of other physicians . Vitals . Screenings to include cognitive, depression, and falls . Referrals and appointments  In addition, I have reviewed and discussed with patient certain preventive protocols, quality metrics, and best practice recommendations. A written personalized care plan for preventive services as well as general preventive health recommendations were provided to patient.     Dorrene German, RN   08/09/2016

## 2016-08-08 NOTE — Telephone Encounter (Signed)
  Follow up Call-  Call back number 08/07/2016  Post procedure Call Back phone  # [phone]  Permission to leave phone message Yes  Some recent data might be hidden     Patient questions:  Do you have a fever, pain , or abdominal swelling? No. Pain Score  0 *  Have you tolerated food without any problems? Yes.    Have you been able to return to your normal activities? Yes.    Do you have any questions about your discharge instructions: Diet   No. Medications  No. Follow up visit  No.  Do you have questions or concerns about your Care? No.  Actions: * If pain score is 4 or above: No action needed, pain <4.

## 2016-08-09 ENCOUNTER — Ambulatory Visit (INDEPENDENT_AMBULATORY_CARE_PROVIDER_SITE_OTHER): Payer: Medicare HMO

## 2016-08-09 VITALS — BP 98/68 | HR 50 | Ht 68.0 in | Wt 196.7 lb

## 2016-08-09 DIAGNOSIS — Z0000 Encounter for general adult medical examination without abnormal findings: Secondary | ICD-10-CM

## 2016-08-09 NOTE — Progress Notes (Addendum)
I have reviewed and agree with note, evaluation, plan. Should follow up with [name] about MMSE to consider/discuss reversible causes of dementia workup at physical.   Garret Reddish, MD  Noted.  Alysia Penna, MD

## 2016-08-09 NOTE — Patient Instructions (Signed)
[name] , Thank you for taking time to come for your Medicare Wellness Visit. I appreciate your ongoing commitment to your health goals. Please review the following plan we discussed and let me know if I can assist you in the future.   Create and bring a copy of your advance directives to your next office visit.  These are the goals we discussed: Goals    . Spend more time traveling.       This is a list of the screening recommended for you and due dates:  Health Maintenance  Topic Date Due  . Complete foot exam   07/09/1950  . Eye exam for diabetics  07/09/1950  . Tetanus Vaccine  07/09/1959  . Hemoglobin A1C  12/02/2015  . Flu Shot  09/26/2016  . Colon Cancer Screening  08/08/2019  . Pneumonia vaccines  Completed   Preventive Care 19 Years and Older, Male Preventive care refers to lifestyle choices and visits with your health care provider that can promote health and wellness. What does preventive care include?  A yearly physical exam. This is also called an annual well check.  Dental exams once or twice a year.  Routine eye exams. Ask your health care provider how often you should have your eyes checked.  Personal lifestyle choices, including: ? Daily care of your teeth and gums. ? Regular physical activity. ? Eating a healthy diet. ? Avoiding tobacco and drug use. ? Limiting alcohol use. ? Practicing safe sex. ? Taking low doses of aspirin every day. ? Taking vitamin and mineral supplements as recommended by your health care provider. What happens during an annual well check? The services and screenings done by your health care provider during your annual well check will depend on your age, overall health, lifestyle risk factors, and family history of disease. Counseling Your health care provider may ask you questions about your:  Alcohol use.  Tobacco use.  Drug use.  Emotional well-being.  Home and relationship well-being.  Sexual activity.  Eating  habits.  History of falls.  Memory and ability to understand (cognition).  Work and work Statistician.  Screening You may have the following tests or measurements:  Height, weight, and BMI.  Blood pressure.  Lipid and cholesterol levels. These may be checked every 5 years, or more frequently if you are over 67 years old.  Skin check.  Lung cancer screening. You may have this screening every year starting at age 82 if you have a 30-pack-year history of smoking and currently smoke or have quit within the past 15 years.  Fecal occult blood test (FOBT) of the stool. You may have this test every year starting at age 39.  Flexible sigmoidoscopy or colonoscopy. You may have a sigmoidoscopy every 5 years or a colonoscopy every 10 years starting at age 56.  Prostate cancer screening. Recommendations will vary depending on your family history and other risks.  Hepatitis C blood test.  Hepatitis B blood test.  Sexually transmitted disease (STD) testing.  Diabetes screening. This is done by checking your blood sugar (glucose) after you have not eaten for a while (fasting). You may have this done every 1-3 years.  Abdominal aortic aneurysm (AAA) screening. You may need this if you are a current or former smoker.  Osteoporosis. You may be screened starting at age 52 if you are at high risk.  Talk with your health care provider about your test results, treatment options, and if necessary, the need for more tests. Vaccines  Your health care provider may recommend certain vaccines, such as:  Influenza vaccine. This is recommended every year.  Tetanus, diphtheria, and acellular pertussis (Tdap, Td) vaccine. You may need a Td booster every 10 years.  Varicella vaccine. You may need this if you have not been vaccinated.  Zoster vaccine. You may need this after age 45.  Measles, mumps, and rubella (MMR) vaccine. You may need at least one dose of MMR if you were born in 1957 or later. You  may also need a second dose.  Pneumococcal 13-valent conjugate (PCV13) vaccine. One dose is recommended after age 19.  Pneumococcal polysaccharide (PPSV23) vaccine. One dose is recommended after age 55.  Meningococcal vaccine. You may need this if you have certain conditions.  Hepatitis A vaccine. You may need this if you have certain conditions or if you travel or work in places where you may be exposed to hepatitis A.  Hepatitis B vaccine. You may need this if you have certain conditions or if you travel or work in places where you may be exposed to hepatitis B.  Haemophilus influenzae type b (Hib) vaccine. You may need this if you have certain risk factors.  Talk to your health care provider about which screenings and vaccines you need and how often you need them. This information is not intended to replace advice given to you by your health care provider. Make sure you discuss any questions you have with your health care provider. Document Released: 03/11/2015 Document Revised: 11/02/2015 Document Reviewed: 12/14/2014 Elsevier Interactive Patient Education  2017 [employer].

## 2016-08-13 ENCOUNTER — Encounter: Payer: Self-pay | Admitting: Internal Medicine

## 2016-08-21 NOTE — Telephone Encounter (Signed)
Pre was sent by pre visit nurse.

## 2016-09-10 ENCOUNTER — Encounter: Payer: Medicare HMO | Admitting: Family Medicine

## 2016-09-24 ENCOUNTER — Other Ambulatory Visit: Payer: Self-pay | Admitting: Family Medicine

## 2016-10-02 ENCOUNTER — Other Ambulatory Visit: Payer: Self-pay | Admitting: Family Medicine

## 2016-10-02 NOTE — Telephone Encounter (Signed)
Pt requesting a 30 day supply of Prilosec while waiting on mail order. I did call in script to [facility] and advised pt to schedule a office visit.

## 2016-10-05 ENCOUNTER — Encounter: Payer: Medicare HMO | Admitting: Family Medicine

## 2016-10-05 DIAGNOSIS — Z0289 Encounter for other administrative examinations: Secondary | ICD-10-CM

## 2016-10-19 ENCOUNTER — Encounter: Payer: Self-pay | Admitting: Family Medicine

## 2016-10-19 ENCOUNTER — Ambulatory Visit (INDEPENDENT_AMBULATORY_CARE_PROVIDER_SITE_OTHER): Payer: Medicare HMO | Admitting: Family Medicine

## 2016-10-19 VITALS — BP 138/88 | HR 50 | Temp 97.7°F | Ht 68.0 in | Wt 199.0 lb

## 2016-10-19 DIAGNOSIS — N401 Enlarged prostate with lower urinary tract symptoms: Secondary | ICD-10-CM

## 2016-10-19 DIAGNOSIS — M150 Primary generalized (osteo)arthritis: Secondary | ICD-10-CM | POA: Diagnosis not present

## 2016-10-19 DIAGNOSIS — K219 Gastro-esophageal reflux disease without esophagitis: Secondary | ICD-10-CM

## 2016-10-19 DIAGNOSIS — N138 Other obstructive and reflux uropathy: Secondary | ICD-10-CM | POA: Diagnosis not present

## 2016-10-19 DIAGNOSIS — I10 Essential (primary) hypertension: Secondary | ICD-10-CM | POA: Diagnosis not present

## 2016-10-19 DIAGNOSIS — G4733 Obstructive sleep apnea (adult) (pediatric): Secondary | ICD-10-CM | POA: Diagnosis not present

## 2016-10-19 DIAGNOSIS — E119 Type 2 diabetes mellitus without complications: Secondary | ICD-10-CM

## 2016-10-19 DIAGNOSIS — M159 Polyosteoarthritis, unspecified: Secondary | ICD-10-CM

## 2016-10-19 LAB — POC URINALSYSI DIPSTICK (AUTOMATED)
BILIRUBIN UA: NEGATIVE
Blood, UA: NEGATIVE
CLARITY UA: NEGATIVE
Glucose, UA: NEGATIVE
KETONES UA: NEGATIVE
LEUKOCYTES UA: NEGATIVE
NITRITE UA: NEGATIVE
Spec Grav, UA: 1.015 (ref 1.010–1.025)
Urobilinogen, UA: 0.2 E.U./dL
pH, UA: 7 (ref 5.0–8.0)

## 2016-10-19 LAB — CBC WITH DIFFERENTIAL/PLATELET
BASOS PCT: 0.5 % (ref 0.0–3.0)
Basophils Absolute: 0 10*3/uL (ref 0.0–0.1)
EOS PCT: 2.8 % (ref 0.0–5.0)
Eosinophils Absolute: 0.2 10*3/uL (ref 0.0–0.7)
HEMATOCRIT: 42.4 % (ref 39.0–52.0)
Hemoglobin: 14.3 g/dL (ref 13.0–17.0)
LYMPHS ABS: 1.7 10*3/uL (ref 0.7–4.0)
LYMPHS PCT: 28.2 % (ref 12.0–46.0)
MCHC: 33.7 g/dL (ref 30.0–36.0)
MCV: 88.8 fl (ref 78.0–100.0)
MONOS PCT: 5.1 % (ref 3.0–12.0)
Monocytes Absolute: 0.3 10*3/uL (ref 0.1–1.0)
NEUTROS ABS: 3.9 10*3/uL (ref 1.4–7.7)
NEUTROS PCT: 63.4 % (ref 43.0–77.0)
PLATELETS: 172 10*3/uL (ref 150.0–400.0)
RBC: 4.78 Mil/uL (ref 4.22–5.81)
RDW: 13.5 % (ref 11.5–15.5)
WBC: 6.2 10*3/uL (ref 4.0–10.5)

## 2016-10-19 LAB — BASIC METABOLIC PANEL
BUN: 14 mg/dL (ref 6–23)
CHLORIDE: 103 meq/L (ref 96–112)
CO2: 31 meq/L (ref 19–32)
CREATININE: 0.95 mg/dL (ref 0.40–1.50)
Calcium: 9.3 mg/dL (ref 8.4–10.5)
GFR: 99.05 mL/min (ref >60.00)
Glucose, Bld: 94 mg/dL (ref 70–99)
POTASSIUM: 3.8 meq/L (ref 3.5–5.1)
Sodium: 140 mEq/L (ref 135–145)

## 2016-10-19 LAB — PSA: PSA: 1.49 ng/mL (ref 0.10–4.00)

## 2016-10-19 LAB — LIPID PANEL
Cholesterol: 150 mg/dL (ref 0–200)
HDL: 41.7 mg/dL
LDL Cholesterol: 88 mg/dL (ref 0–99)
NonHDL: 108.12
Total CHOL/HDL Ratio: 4
Triglycerides: 99 mg/dL (ref 0.0–149.0)
VLDL: 19.8 mg/dL (ref 0.0–40.0)

## 2016-10-19 LAB — TSH: TSH: 1.62 u[IU]/mL (ref 0.35–4.50)

## 2016-10-19 LAB — HEMOGLOBIN A1C: Hgb A1c MFr Bld: 6.5 % (ref 4.6–6.5)

## 2016-10-19 LAB — HEPATIC FUNCTION PANEL
ALK PHOS: 79 U/L (ref 39–117)
ALT: 17 U/L (ref 0–53)
AST: 23 U/L (ref 0–37)
Albumin: 4.1 g/dL (ref 3.5–5.2)
BILIRUBIN DIRECT: 0.2 mg/dL (ref 0.0–0.3)
BILIRUBIN TOTAL: 1.3 mg/dL — AB (ref 0.2–1.2)
Total Protein: 6.6 g/dL (ref 6.0–8.3)

## 2016-10-19 MED ORDER — DICLOFENAC SODIUM 1 % TD GEL: 2.0000 g | Freq: Four times a day (QID) | TRANSDERMAL | 11 refills | Status: DC

## 2016-10-19 NOTE — Progress Notes (Signed)
   Subjective:    Patient ID: Eric Richmond, male    DOB: 26-Mar-1940, 76 y.o.   MRN: [id number]  HPI Here to follow up. He feels great and has no concerns. His BP is stable at home. He has not checked his glucoses in some time.   Review of Systems  Constitutional: Negative.   HENT: Negative.   Eyes: Negative.   Respiratory: Negative.   Cardiovascular: Negative.   Gastrointestinal: Negative.   Genitourinary: Negative.   Musculoskeletal: Positive for arthralgias. Negative for back pain, gait problem, joint swelling, myalgias, neck pain and neck stiffness.  Skin: Negative.   Neurological: Negative.   Psychiatric/Behavioral: Negative.        Objective:   Physical Exam  Constitutional: He is oriented to person, place, and time. He appears well-developed and well-nourished. No distress.  HENT:  Head: Normocephalic and atraumatic.  Right Ear: External ear normal.  Left Ear: External ear normal.  Nose: Nose normal.  Mouth/Throat: Oropharynx is clear and moist. No oropharyngeal exudate.  Eyes: Pupils are equal, round, and reactive to light. Conjunctivae and EOM are normal. Right eye exhibits no discharge. Left eye exhibits no discharge. No scleral icterus.  Neck: Neck supple. No JVD present. No tracheal deviation present. No thyromegaly present.  Cardiovascular: Normal rate, regular rhythm, normal heart sounds and intact distal pulses.  Exam reveals no gallop and no friction rub.   No murmur heard. Pulmonary/Chest: Effort normal and breath sounds normal. No respiratory distress. He has no wheezes. He has no rales. He exhibits no tenderness.  Abdominal: Soft. Bowel sounds are normal. He exhibits no distension and no mass. There is no tenderness. There is no rebound and no guarding.  Genitourinary: Rectum normal, prostate normal and penis normal. Rectal exam shows guaiac negative stool. No penile tenderness.  Musculoskeletal: Normal range of motion. He exhibits no edema or tenderness.    Lymphadenopathy:    He has no cervical adenopathy.  Neurological: He is alert and oriented to person, place, and time. He has normal reflexes. No cranial nerve deficit. He exhibits normal muscle tone. Coordination normal.  Skin: Skin is warm and dry. No rash noted. He is not diaphoretic. No erythema. No pallor.  Psychiatric: He has a normal mood and affect. His behavior is normal. Judgment and thought content normal.          Assessment & Plan:  His HTN is stable. His GERD is stable. His osteoarthritis is stable. We will get fasting labs to day including an A1c to check diabetes, lipids, etc.  Alysia Penna, MD

## 2016-10-19 NOTE — Patient Instructions (Signed)
WE NOW OFFER   Eric Richmond's FAST TRACK!!!  SAME DAY Appointments for ACUTE CARE  Such as: Sprains, Injuries, cuts, abrasions, rashes, muscle pain, joint pain, back pain Colds, flu, sore throats, headache, allergies, cough, fever  Ear pain, sinus and eye infections Abdominal pain, nausea, vomiting, diarrhea, upset stomach Animal/insect bites  3 Easy Ways to Schedule: Walk-In Scheduling Call in scheduling Mychart Sign-up: [email or web address]         

## 2016-12-24 ENCOUNTER — Other Ambulatory Visit: Payer: Self-pay | Admitting: Family Medicine

## 2017-01-28 ENCOUNTER — Encounter: Payer: Self-pay | Admitting: Family Medicine

## 2017-01-28 DIAGNOSIS — H2513 Age-related nuclear cataract, bilateral: Secondary | ICD-10-CM | POA: Diagnosis not present

## 2017-01-28 DIAGNOSIS — H353131 Nonexudative age-related macular degeneration, bilateral, early dry stage: Secondary | ICD-10-CM | POA: Diagnosis not present

## 2017-01-28 DIAGNOSIS — H40013 Open angle with borderline findings, low risk, bilateral: Secondary | ICD-10-CM | POA: Diagnosis not present

## 2017-01-28 DIAGNOSIS — H25013 Cortical age-related cataract, bilateral: Secondary | ICD-10-CM | POA: Diagnosis not present

## 2017-01-28 DIAGNOSIS — H524 Presbyopia: Secondary | ICD-10-CM | POA: Diagnosis not present

## 2017-04-12 ENCOUNTER — Telehealth: Payer: Self-pay | Admitting: Pulmonary Disease

## 2017-04-12 DIAGNOSIS — G4733 Obstructive sleep apnea (adult) (pediatric): Secondary | ICD-10-CM | POA: Diagnosis not present

## 2017-04-12 NOTE — Telephone Encounter (Signed)
He would need ROV first before machine can be ordered.  Can we schedule him for earlier visit with me?  Okay to double book if needed.

## 2017-04-12 NOTE — Telephone Encounter (Signed)
Called and spoke with pt's spouse, Dorathy Daft. Dorathy Daft requested sooner apt. Pt has been scheduled for 04/19/17 at 9:15-okay to double book per VS. Nothing further is needed.

## 2017-04-12 NOTE — Telephone Encounter (Signed)
VS please advise, would you like to see patient first or are you ok with [place] ordering new machine. Thanks.

## 2017-04-18 ENCOUNTER — Telehealth: Payer: Self-pay | Admitting: Pulmonary Disease

## 2017-04-18 NOTE — Telephone Encounter (Signed)
Attempted to contact pt's wife. Home number listed has been disconnected. Called cell phone number and there was no answer and I could not leave message. Will try back.

## 2017-04-19 ENCOUNTER — Ambulatory Visit: Payer: Medicare HMO | Admitting: Pulmonary Disease

## 2017-04-19 NOTE — Telephone Encounter (Signed)
Pt's appt was rescheduled for 04/22/17 with TP.  Closing this encounter.

## 2017-04-22 ENCOUNTER — Encounter: Payer: Self-pay | Admitting: Adult Health

## 2017-04-22 ENCOUNTER — Ambulatory Visit: Payer: Medicare HMO | Admitting: Adult Health

## 2017-04-22 DIAGNOSIS — G4733 Obstructive sleep apnea (adult) (pediatric): Secondary | ICD-10-CM | POA: Diagnosis not present

## 2017-04-22 NOTE — Progress Notes (Signed)
@Patient  ID: Eric Richmond, male    DOB: [birth year], 77 y.o.   MRN: [id number]  Chief Complaint  Patient presents with  . Follow-up    OSA     Referring provider: [name], MD  HPI: 77 year old male followed for sleep apnea  TEST  NPSG 2005:  AHI 66/hr  04/22/2017 Follow up : OSA  Presents for follow-up for sleep apnea.  Patient was last seen office in 2016.  Patient says he is doing very well on his CPAP. Feels rested with no signiifcant daytime sleepiness .   He wears it every single night with no significant daytime sleepiness.  Patient's download shows excellent compliance with average usage at 7 hours.  Patient is on CPAP 13 cm H2O.  AHI 0.5.  Minimal leaks. Feels he needs new machine , it has been leaking. DME could not fix that.     Allergies  Allergen Reactions  . Oxycodone     Hallucinations   . Tramadol     Hallucinations     Immunization History  Administered Date(s) Administered  . Influenza Split 01/10/2013  . Influenza Whole 11/21/2009, 01/17/2010  . Influenza, High Dose Seasonal PF 11/18/2014, 12/30/2015, 12/27/2016  . Influenza,inj,Quad PF,6+ Mos 12/15/2013  . Pneumococcal Conjugate-13 12/15/2013  . Pneumococcal Polysaccharide-23 06/02/2015    Past Medical History:  Diagnosis Date  . Allergy   . Anal fissure 20 years ago  . ED (erectile dysfunction)   . GERD (gastroesophageal reflux disease)   . Hard of hearing   . OSA (obstructive sleep apnea)    per Dr. Gwenette Greet , cpap -2.0  . Osteoarthritis    OA  RIGHT KNEE AND RIGHT THUMB;  S/P LEFT TOTAL KNEE REPLACEMENT  . Pneumonia    spring 2015 - HAS NOT USED OR NEEDED INHALER SINCE   . Sleep apnea    uses CPAP  . Unspecified essential hypertension     Tobacco History: Social History   Tobacco Use  Smoking Status Former Smoker  . Packs/day: 0.30  . Years: 20.00  . Pack years: 6.00  . Types: Cigarettes  . Last attempt to quit: 02/26/1977  . Years since quitting: 40.1  Smokeless  Tobacco Former Systems developer  . Types: Chew  Tobacco Comment   occ   Counseling given: Not Answered Comment: occ   Outpatient Encounter Medications as of 04/22/2017  Medication Sig  . aspirin EC 81 MG tablet Take 81 mg by mouth daily.  Marland Kitchen atenolol (TENORMIN) 50 MG tablet TAKE 1 TABLET EVERY DAY (NEED MD APPOINTMENT FOR REFILLS)  . Multiple Vitamin (MULTIVITAMIN WITH MINERALS) TABS tablet Take 1 tablet by mouth daily.  Marland Kitchen omeprazole (PRILOSEC) 40 MG capsule TAKE 1 CAPSULE EVERY DAY  . diclofenac sodium (VOLTAREN) 1 % GEL Apply 2 g topically 4 (four) times daily. (Patient not taking: Reported on 04/22/2017)  . losartan-hydrochlorothiazide (HYZAAR) 100-25 MG tablet TAKE 1 TABLET EVERY MORNING (Patient not taking: Reported on 04/22/2017)  . Potassium 99 MG TABS Take 99 mg by mouth daily.  Marland Kitchen PROAIR HFA 108 (90 BASE) MCG/ACT inhaler Inhale 1 puff into the lungs every 6 (six) hours as needed for wheezing or shortness of breath (pt states he has not used in "months").    No facility-administered encounter medications on file as of 04/22/2017.      Review of Systems  Constitutional:   No  weight loss, night sweats,  Fevers, chills, fatigue, or  lassitude.  HEENT:   No headaches,  Difficulty swallowing,  Tooth/dental problems, or  Sore throat,                No sneezing, itching, ear ache, nasal congestion, post nasal drip,   CV:  No chest pain,  Orthopnea, PND, swelling in lower extremities, anasarca, dizziness, palpitations, syncope.   GI  No heartburn, indigestion, abdominal pain, nausea, vomiting, diarrhea, change in bowel habits, loss of appetite, bloody stools.   Resp: No shortness of breath with exertion or at rest.  No excess mucus, no productive cough,  No non-productive cough,  No coughing up of blood.  No change in color of mucus.  No wheezing.  No chest wall deformity  Skin: no rash or lesions.  GU: no dysuria, change in color of urine, no urgency or frequency.  No flank pain, no hematuria     MS:  No joint pain or swelling.  No decreased range of motion.  No back pain.    Physical Exam  BP 108/66 (BP Location: Left Arm, Cuff Size: Normal)   Pulse (!) 52   Ht 5\' 8"  (1.727 m)   Wt 207 lb 12.8 oz (94.3 kg)   SpO2 98%   BMI 31.60 kg/m   GEN: A/Ox3; pleasant , NAD, obese    HEENT:  [place]/AT,  EACs-clear, TMs-wnl, NOSE-clear, THROAT-clear, no lesions, no postnasal drip or exudate noted. Class 2-3 MP airway   NECK:  Supple w/ fair ROM; no JVD; normal carotid impulses w/o bruits; no thyromegaly or nodules palpated; no lymphadenopathy.    RESP  Clear  P & A; w/o, wheezes/ rales/ or rhonchi. no accessory muscle use, no dullness to percussion  CARD:  RRR, no m/r/g, no peripheral edema, pulses intact, no cyanosis or clubbing.  GI:   Soft & nt; nml bowel sounds; no organomegaly or masses detected.   Musco: Warm bil, no deformities or joint swelling noted.   Neuro: alert, no focal deficits noted.    Skin: Warm, no lesions or rashes    Lab Results:   BNP No results found for: BNP  ProBNP No results found for: PROBNP  Imaging: No results found.   Assessment & Plan:   No problem-specific Assessment & Plan notes found for this encounter.     Rexene Edison, NP 04/22/2017

## 2017-04-22 NOTE — Addendum Note (Signed)
Addended by: Parke Poisson E on: 04/22/2017 12:21 PM   Modules accepted: Orders

## 2017-04-22 NOTE — Patient Instructions (Addendum)
Continue on CPAP At bedtime .  Order for new machine and new supplies  Work on healthy weight.  Do not drive if sleepy  Follow up with Dr. Elsworth Soho  In 1 year  And As needed

## 2017-04-22 NOTE — Assessment & Plan Note (Signed)
-   Weight loss 

## 2017-04-22 NOTE — Assessment & Plan Note (Signed)
Well-controlled on current regimen  Plan  Patient Instructions  Continue on CPAP At bedtime .  Order for new machine and new supplies  Work on healthy weight.  Do not drive if sleepy  Follow up with Dr. Elsworth Soho  In 1 year  And As needed

## 2017-05-03 DIAGNOSIS — M179 Osteoarthritis of knee, unspecified: Secondary | ICD-10-CM | POA: Diagnosis not present

## 2017-05-03 DIAGNOSIS — G4733 Obstructive sleep apnea (adult) (pediatric): Secondary | ICD-10-CM | POA: Diagnosis not present

## 2017-05-06 ENCOUNTER — Encounter: Payer: Self-pay | Admitting: Family Medicine

## 2017-05-06 ENCOUNTER — Ambulatory Visit (INDEPENDENT_AMBULATORY_CARE_PROVIDER_SITE_OTHER): Payer: Medicare HMO | Admitting: Family Medicine

## 2017-05-06 VITALS — BP 132/78 | HR 54 | Temp 97.7°F | Wt 205.8 lb

## 2017-05-06 DIAGNOSIS — K5900 Constipation, unspecified: Secondary | ICD-10-CM

## 2017-05-06 NOTE — Progress Notes (Signed)
   Subjective:    Patient ID: Eric Richmond, male    DOB: 24-Feb-1941, 77 y.o.   MRN: [id number]  HPI Here for one week of constipation. He usually has 2 easy stools a day but lately the stools have been dry and hard and he has had to strain to pass them. No abdominal pain or bloating. He typically does not drink much wtater.    Review of Systems  Constitutional: Negative.   Respiratory: Negative.   Cardiovascular: Negative.   Gastrointestinal: Positive for constipation. Negative for abdominal distention, abdominal pain, anal bleeding, blood in stool, diarrhea, nausea, rectal pain and vomiting.  Genitourinary: Negative.        Objective:   Physical Exam  Constitutional: He appears well-developed and well-nourished. No distress.  Cardiovascular: Normal rate, regular rhythm, normal heart sounds and intact distal pulses.  Pulmonary/Chest: Effort normal and breath sounds normal. No respiratory distress. He has no wheezes. He has no rales.  Abdominal: Soft. Bowel sounds are normal. He exhibits no distension and no mass. There is no tenderness. There is no rebound and no guarding.          Assessment & Plan:  Constipation. He was advised to drink plenty of water and to eat fiber rich foods. Use Milk of Magnesia TID for 3 days. Start using Miralax with a full glass of water daily. Recheck prn. Alysia Penna, MD

## 2017-06-03 ENCOUNTER — Institutional Professional Consult (permissible substitution): Payer: Medicare HMO | Admitting: Pulmonary Disease

## 2017-06-03 DIAGNOSIS — M179 Osteoarthritis of knee, unspecified: Secondary | ICD-10-CM | POA: Diagnosis not present

## 2017-06-03 DIAGNOSIS — G4733 Obstructive sleep apnea (adult) (pediatric): Secondary | ICD-10-CM | POA: Diagnosis not present

## 2017-07-03 DIAGNOSIS — G4733 Obstructive sleep apnea (adult) (pediatric): Secondary | ICD-10-CM | POA: Diagnosis not present

## 2017-07-03 DIAGNOSIS — M179 Osteoarthritis of knee, unspecified: Secondary | ICD-10-CM | POA: Diagnosis not present

## 2017-07-10 ENCOUNTER — Other Ambulatory Visit: Payer: Self-pay | Admitting: Family Medicine

## 2017-07-10 NOTE — Telephone Encounter (Signed)
Last OV 05/06/17 ( Constipation), No future OV  Both last filled on 12/24/16, Both # 90 with 1 refill  Please advise if okay to fill or if patient needs an appointment first

## 2017-07-29 ENCOUNTER — Ambulatory Visit (INDEPENDENT_AMBULATORY_CARE_PROVIDER_SITE_OTHER): Payer: Medicare HMO | Admitting: Pulmonary Disease

## 2017-07-29 ENCOUNTER — Encounter: Payer: Self-pay | Admitting: Pulmonary Disease

## 2017-07-29 VITALS — BP 118/74 | HR 51 | Ht 68.0 in | Wt 201.2 lb

## 2017-07-29 DIAGNOSIS — J310 Chronic rhinitis: Secondary | ICD-10-CM | POA: Diagnosis not present

## 2017-07-29 DIAGNOSIS — R682 Dry mouth, unspecified: Secondary | ICD-10-CM | POA: Diagnosis not present

## 2017-07-29 DIAGNOSIS — G4733 Obstructive sleep apnea (adult) (pediatric): Secondary | ICD-10-CM

## 2017-07-29 DIAGNOSIS — Z9989 Dependence on other enabling machines and devices: Secondary | ICD-10-CM | POA: Diagnosis not present

## 2017-07-29 NOTE — Patient Instructions (Signed)
Will have your home care company change your CPAP to 11 cm H2O Call if you are still having trouble with snoring after pressure changed Can try using flonase 1 spray each nostril daily as needed if you sinus congestion continues  Follow up in 1 year

## 2017-07-29 NOTE — Progress Notes (Signed)
[facility]  Chief Complaint  Patient presents with  . Consult    wife reports the snoring is getting worse and she is unable to sleep, would like another sleep study.    Vital signs: BP 118/74   Pulse (!) 51   Ht 5\' 8"  (1.727 m)   Wt 201 lb 3.2 oz (91.3 kg)   SpO2 96%   BMI 30.59 kg/m   History of Present Illness: Eric Richmond is a 77 y.o. male with obstructive sleep apnea.  Previously seen by Dr. Gwenette Greet.  His sleep study from 2005 showed severe sleep apnea.  He was seen by Rexene Edison earlier this year.  Got new CPAP.  He used to snore some with old machine, but is snoring lots more now.  He feels pressure is too high now.  He is getting dry out and sinus congestion.  His download showed significant mask leak.  Physical Exam:  General - pleasant Eyes - pupils reactive ENT - no sinus tenderness, no oral exudate, no LAN Cardiac - regular, no murmur Chest - no wheeze, rales Abd - soft, non tender Ext - no edema Skin - no rashes Neuro - normal strength Psych - normal mood  Assessment/Plan:  Obstructive sleep apnea. - he is compliant with CPAP and reports benefit - will change him from CPAP 13 to 11 cm H2O  Mouth dryness. - discussed how to adjust humidifier on CPAP - will decrease CPAP setting - might need mask refitting  CPAP rhinitis. - if this persists after adjusting pressure and humidifier setting, then he can try flonase   Patient Instructions  Will have your home care company change your CPAP to 11 cm H2O Call if you are still having trouble with snoring after pressure changed Can try using flonase 1 spray each nostril daily as needed if you sinus congestion continues  Follow up in 1 year    Chesley Mires, MD [place] 07/29/2017, 3:19 PM  Flow Sheet  Sleep tests: PSG 09/24/03 >> AHI 66 CPAP 06/29/17 to 07/28/17 >> used on 28 to 30 nights with average 6 hrs 59 min.  Average AHI 1.8  with CPAP 13 cm H2O  Past Medical History: He  has a past medical history of Allergy, Anal fissure (20 years ago), ED (erectile dysfunction), GERD (gastroesophageal reflux disease), Hard of hearing, OSA (obstructive sleep apnea), Osteoarthritis, Pneumonia, Sleep apnea, and Unspecified essential hypertension.  Past Surgical History: He  has a past surgical history that includes Knee arthroscopy; colonoscopy (08/07/2016); Total knee arthroplasty (Left, 10/26/2013); SURGERY AGE 25 OR 7 ON LEFT KNEE FOR A "GLAND BEHIND THE KNEE"; Anal fissure repair; and Knee Closed Reduction (Left, 01/13/2014).  Family History: His family history includes Breast cancer in his sister; Diabetes in his brother, father, mother, sister, sister, and sister; Heart failure in his brother and brother; Hypertension in his brother, father, mother, sister, and unknown relative; Thyroid cancer in his sister.  Social History: He  reports that he quit smoking about 40 years ago. His smoking use included cigarettes. He has a 6.00 pack-year smoking history. His smokeless tobacco use includes chew. He reports that he does not drink alcohol or use drugs.  Medications: Allergies as of 07/29/2017      Reactions   Oxycodone    Hallucinations    Tramadol    Hallucinations       Medication List        Accurate as of 07/29/17  3:19 PM. Always use your most recent med list.          aspirin EC 81 MG tablet Take 81 mg by mouth daily.   atenolol 50 MG tablet Commonly known as:  TENORMIN TAKE 1 TABLET EVERY DAY (NEED MD APPOINTMENT FOR REFILLS)   losartan-hydrochlorothiazide 100-25 MG tablet Commonly known as:  HYZAAR TAKE 1 TABLET EVERY MORNING   multivitamin with minerals Tabs tablet Take 1 tablet by mouth daily.   omeprazole 40 MG capsule Commonly known as:  PRILOSEC TAKE 1 CAPSULE EVERY DAY   PROAIR HFA 108 (90 Base) MCG/ACT inhaler Generic drug:  albuterol Inhale 1 puff into the lungs every 6 (six) hours as needed  for wheezing or shortness of breath (pt states he has not used in "months").

## 2017-07-29 NOTE — Progress Notes (Signed)
   Subjective:    Patient ID: Verne Carrow, male    DOB: 03-Mar-1940, 77 y.o.   MRN: [id number]  HPI    Review of Systems  Constitutional: Negative for fever and unexpected weight change.  HENT: Positive for sinus pressure, sneezing and sore throat. Negative for congestion, dental problem, ear pain, nosebleeds, postnasal drip, rhinorrhea and trouble swallowing.   Eyes: Negative for redness and itching.  Respiratory: Negative for cough, chest tightness, shortness of breath and wheezing.   Cardiovascular: Negative for palpitations and leg swelling.  Gastrointestinal: Negative for nausea and vomiting.  Genitourinary: Negative for dysuria.  Musculoskeletal: Negative for joint swelling.  Skin: Negative for rash.  Allergic/Immunologic: Negative.  Negative for environmental allergies, food allergies and immunocompromised state.  Neurological: Negative for headaches.  Hematological: Does not bruise/bleed easily.  Psychiatric/Behavioral: Negative for dysphoric mood. The patient is not nervous/anxious.        Objective:   Physical Exam        Assessment & Plan:

## 2017-08-03 DIAGNOSIS — G4733 Obstructive sleep apnea (adult) (pediatric): Secondary | ICD-10-CM | POA: Diagnosis not present

## 2017-08-03 DIAGNOSIS — M179 Osteoarthritis of knee, unspecified: Secondary | ICD-10-CM | POA: Diagnosis not present

## 2017-08-13 ENCOUNTER — Ambulatory Visit (INDEPENDENT_AMBULATORY_CARE_PROVIDER_SITE_OTHER): Payer: Medicare HMO

## 2017-08-13 VITALS — BP 136/80 | HR 50 | Ht 68.0 in | Wt 200.5 lb

## 2017-08-13 DIAGNOSIS — Z0000 Encounter for general adult medical examination without abnormal findings: Secondary | ICD-10-CM | POA: Diagnosis not present

## 2017-08-13 NOTE — Patient Instructions (Addendum)
[name] , Thank you for taking time to come for your Medicare Wellness Visit. I appreciate your ongoing commitment to your health goals. Please review the following plan we discussed and let me know if I can assist you in the future.   Please schedule an apt with [name] on august 26 for your physical  Because this is Monday You can also make an apt for me next year   Need to make an apt with for  An eye exam this year; has one coming up   Had a tetanus recently at work. Will ask [name] about when you had tetanus and let us know so we can document .  Shingrix is a vaccine for the prevention of Shingles in Adults 50 and older.  If you are on Medicare, the shingrix is covered under your Part D plan, so you will take both of the vaccines in the series at your pharmacy. Please check with your benefits regarding applicable copays or out of pocket expenses.  The Shingrix is given in 2 vaccines approx 8 weeks apart. You must receive the 2nd dose prior to 6 months from receipt of the first. Please have the pharmacist print out you Immunization  dates for our office records   Foot exam deferred at present; but if you get any cuts or lacerations, you need to see [name]   These are the goals we discussed: Goals    . Exercise 150 min/wk Moderate Activity     Will get back to the gym 3 days . This will help your pre -diabetic numbers   Educated regarding prediabetes and numbers;  A1c ranges from 5.8 to 6.5 or fasting Blood sugar > 115 -126; (126 is diabetic)   Risk: >45yo; family hx; overweight or obese; African American; Hispanic; Latino; American [place] American; [place]; history of diabetes when pregnant; or birth to a baby weighing over 9 lbs. Being less physically active than 30 minutes; 3 times a week;   Prevention; Losing a modest 7 to 8 lbs; If over 200 lbs; 10 to 14 lbs;  Choose healthier foods; colorful veggies; fish or lean meats; drinks water Reduce portion  size Start exercising; 30 minutes of fast walking x 30 minutes per day/ 60 min for weight loss             This is a list of the screening recommended for you and due dates:  Health Maintenance  Topic Date Due  . Complete foot exam   07/09/1950  . Eye exam for diabetics  07/09/1950  . Tetanus Vaccine  07/09/1959  . Hemoglobin A1C  04/21/2017  . Flu Shot  09/26/2017  . Colon Cancer Screening  08/08/2019  . Pneumonia vaccines  Completed     Diabetes and Foot Care Diabetes may cause you to have problems because of poor blood supply (circulation) to your feet and legs. This may cause the skin on your feet to become thinner, break easier, and heal more slowly. Your skin may become dry, and the skin may peel and crack. You may also have nerve damage in your legs and feet causing decreased feeling in them. You may not notice minor injuries to your feet that could lead to infections or more serious problems. Taking care of your feet is one of the most important things you can do for yourself. Follow these instructions at home:  Wear shoes at all times, even in the house. Do not go barefoot. Bare feet  are easily injured.  Check your feet daily for blisters, cuts, and redness. If you cannot see the bottom of your feet, use a mirror or ask someone for help.  Wash your feet with warm water (do not use hot water) and mild soap. Then pat your feet and the areas between your toes until they are completely dry. Do not soak your feet as this can dry your skin.  Apply a moisturizing lotion or petroleum jelly (that does not contain alcohol and is unscented) to the skin on your feet and to dry, brittle toenails. Do not apply lotion between your toes.  Trim your toenails straight across. Do not dig under them or around the cuticle. File the edges of your nails with an emery board or nail file.  Do not cut corns or calluses or try to remove them with medicine.  Wear clean socks or stockings every  day. Make sure they are not too tight. Do not wear knee-high stockings since they may decrease blood flow to your legs.  Wear shoes that fit properly and have enough cushioning. To break in new shoes, wear them for just a few hours a day. This prevents you from injuring your feet. Always look in your shoes before you put them on to be sure there are no objects inside.  Do not cross your legs. This may decrease the blood flow to your feet.  If you find a minor scrape, cut, or break in the skin on your feet, keep it and the skin around it clean and dry. These areas may be cleansed with mild soap and water. Do not cleanse the area with peroxide, alcohol, or iodine.  When you remove an adhesive bandage, be sure not to damage the skin around it.  If you have a wound, look at it several times a day to make sure it is healing.  Do not use heating pads or hot water bottles. They may burn your skin. If you have lost feeling in your feet or legs, you may not know it is happening until it is too late.  Make sure your health care provider performs a complete foot exam at least annually or more often if you have foot problems. Report any cuts, sores, or bruises to your health care provider immediately. Contact a health care provider if:  You have an injury that is not healing.  You have cuts or breaks in the skin.  You have an ingrown nail.  You notice redness on your legs or feet.  You feel burning or tingling in your legs or feet.  You have pain or cramps in your legs and feet.  Your legs or feet are numb.  Your feet always feel cold. Get help right away if:  There is increasing redness, swelling, or pain in or around a wound.  There is a red line that goes up your leg.  Pus is coming from a wound.  You develop a fever or as directed by your health care provider.  You notice a bad smell coming from an ulcer or wound. This information is not intended to replace advice given to you by  your health care provider. Make sure you discuss any questions you have with your health care provider. Document Released: 02/10/2000 Document Revised: 07/21/2015 Document Reviewed: 07/22/2012 Elsevier Interactive Patient Education  2017 [place] Prevention in the Home Falls can cause injuries. They can happen to people of all ages. There are many things you  can do to make your home safe and to help prevent falls. What can I do on the outside of my home?  Regularly fix the edges of walkways and driveways and fix any cracks.  Remove anything that might make you trip as you walk through a door, such as a raised step or threshold.  Trim any bushes or trees on the path to your home.  Use bright outdoor lighting.  Clear any walking paths of anything that might make someone trip, such as rocks or tools.  Regularly check to see if handrails are loose or broken. Make sure that both sides of any steps have handrails.  Any raised decks and porches should have guardrails on the edges.  Have any leaves, snow, or ice cleared regularly.  Use sand or salt on walking paths during winter.  Clean up any spills in your garage right away. This includes oil or grease spills. What can I do in the bathroom?  Use night lights.  Install grab bars by the toilet and in the tub and shower. Do not use towel bars as grab bars.  Use non-skid mats or decals in the tub or shower.  If you need to sit down in the shower, use a plastic, non-slip stool.  Keep the floor dry. Clean up any water that spills on the floor as soon as it happens.  Remove soap buildup in the tub or shower regularly.  Attach bath mats securely with double-sided non-slip rug tape.  Do not have throw rugs and other things on the floor that can make you trip. What can I do in the bedroom?  Use night lights.  Make sure that you have a light by your bed that is easy to reach.  Do not use any sheets or blankets that are  too big for your bed. They should not hang down onto the floor.  Have a firm chair that has side arms. You can use this for support while you get dressed.  Do not have throw rugs and other things on the floor that can make you trip. What can I do in the kitchen?  Clean up any spills right away.  Avoid walking on wet floors.  Keep items that you use a lot in easy-to-reach places.  If you need to reach something above you, use a strong step stool that has a grab bar.  Keep electrical cords out of the way.  Do not use floor polish or wax that makes floors slippery. If you must use wax, use non-skid floor wax.  Do not have throw rugs and other things on the floor that can make you trip. What can I do with my stairs?  Do not leave any items on the stairs.  Make sure that there are handrails on both sides of the stairs and use them. Fix handrails that are broken or loose. Make sure that handrails are as long as the stairways.  Check any carpeting to make sure that it is firmly attached to the stairs. Fix any carpet that is loose or worn.  Avoid having throw rugs at the top or bottom of the stairs. If you do have throw rugs, attach them to the floor with carpet tape.  Make sure that you have a light switch at the top of the stairs and the bottom of the stairs. If you do not have them, ask someone to add them for you. What else can I do to help prevent falls?  Wear shoes  that: ? Do not have high heels. ? Have rubber bottoms. ? Are comfortable and fit you well. ? Are closed at the toe. Do not wear sandals.  If you use a stepladder: ? Make sure that it is fully opened. Do not climb a closed stepladder. ? Make sure that both sides of the stepladder are locked into place. ? Ask someone to hold it for you, if possible.  Clearly mark and make sure that you can see: ? Any grab bars or handrails. ? First and last steps. ? Where the edge of each step is.  Use tools that help you move  around (mobility aids) if they are needed. These include: ? Canes. ? Walkers. ? Scooters. ? Crutches.  Turn on the lights when you go into a dark area. Replace any light bulbs as soon as they burn out.  Set up your furniture so you have a clear path. Avoid moving your furniture around.  If any of your floors are uneven, fix them.  If there are any pets around you, be aware of where they are.  Review your medicines with your doctor. Some medicines can make you feel dizzy. This can increase your chance of falling. Ask your doctor what other things that you can do to help prevent falls. This information is not intended to replace advice given to you by your health care provider. Make sure you discuss any questions you have with your health care provider. Document Released: 12/09/2008 Document Revised: 07/21/2015 Document Reviewed: 03/19/2014 Elsevier Interactive Patient Education  2018 [place] Maintenance, Male A healthy lifestyle and preventive care is important for your health and wellness. Ask your health care provider about what schedule of regular examinations is right for you. What should I know about weight and diet? Eat a Healthy Diet  Eat plenty of vegetables, fruits, whole grains, low-fat dairy products, and lean protein.  Do not eat a lot of foods high in solid fats, added sugars, or salt.  Maintain a Healthy Weight Regular exercise can help you achieve or maintain a healthy weight. You should:  Do at least 150 minutes of exercise each week. The exercise should increase your heart rate and make you sweat (moderate-intensity exercise).  Do strength-training exercises at least twice a week.  Watch Your Levels of Cholesterol and Blood Lipids  Have your blood tested for lipids and cholesterol every 5 years starting at 77 years of age. If you are at high risk for heart disease, you should start having your blood tested when you are 77 years old. You may need  to have your cholesterol levels checked more often if: ? Your lipid or cholesterol levels are high. ? You are older than 77 years of age. ? You are at high risk for heart disease.  What should I know about cancer screening? Many types of cancers can be detected early and may often be prevented. Lung Cancer  You should be screened every year for lung cancer if: ? You are a current smoker who has smoked for at least 30 years. ? You are a former smoker who has quit within the past 15 years.  Talk to your health care provider about your screening options, when you should start screening, and how often you should be screened.  Colorectal Cancer  Routine colorectal cancer screening usually begins at 77 years of age and should be repeated every 5-10 years until you are 77 years old. You may need to be screened  more often if early forms of precancerous polyps or small growths are found. Your health care provider may recommend screening at an earlier age if you have risk factors for colon cancer.  Your health care provider may recommend using home test kits to check for hidden blood in the stool.  A small camera at the end of a tube can be used to examine your colon (sigmoidoscopy or colonoscopy). This checks for the earliest forms of colorectal cancer.  Prostate and Testicular Cancer  Depending on your age and overall health, your health care provider may do certain tests to screen for prostate and testicular cancer.  Talk to your health care provider about any symptoms or concerns you have about testicular or prostate cancer.  Skin Cancer  Check your skin from head to toe regularly.  Tell your health care provider about any new moles or changes in moles, especially if: ? There is a change in a mole's size, shape, or color. ? You have a mole that is larger than a pencil eraser.  Always use sunscreen. Apply sunscreen liberally and repeat throughout the day.  Protect yourself by wearing  long sleeves, pants, a wide-brimmed hat, and sunglasses when outside.  What should I know about heart disease, diabetes, and high blood pressure?  If you are 82-85 years of age, have your blood pressure checked every 3-5 years. If you are 56 years of age or older, have your blood pressure checked every year. You should have your blood pressure measured twice-once when you are at a hospital or clinic, and once when you are not at a hospital or clinic. Record the average of the two measurements. To check your blood pressure when you are not at a hospital or clinic, you can use: ? An automated blood pressure machine at a pharmacy. ? A home blood pressure monitor.  Talk to your health care provider about your target blood pressure.  If you are between 45-23 years old, ask your health care provider if you should take aspirin to prevent heart disease.  Have regular diabetes screenings by checking your fasting blood sugar level. ? If you are at a normal weight and have a low risk for diabetes, have this test once every three years after the age of 7. ? If you are overweight and have a high risk for diabetes, consider being tested at a younger age or more often.  A one-time screening for abdominal aortic aneurysm (AAA) by ultrasound is recommended for men aged 10-75 years who are current or former smokers. What should I know about preventing infection? Hepatitis B If you have a higher risk for hepatitis B, you should be screened for this virus. Talk with your health care provider to find out if you are at risk for hepatitis B infection. Hepatitis C Blood testing is recommended for:  Everyone born from [birth year] through 1965.  Anyone with known risk factors for hepatitis C.  Sexually Transmitted Diseases (STDs)  You should be screened each year for STDs including gonorrhea and chlamydia if: ? You are sexually active and are younger than 77 years of age. ? You are older than 77 years of age and your  health care provider tells you that you are at risk for this type of infection. ? Your sexual activity has changed since you were last screened and you are at an increased risk for chlamydia or gonorrhea. Ask your health care provider if you are at risk.  Talk with your health care  provider about whether you are at high risk of being infected with HIV. Your health care provider may recommend a prescription medicine to help prevent HIV infection.  What else can I do?  Schedule regular health, dental, and eye exams.  Stay current with your vaccines (immunizations).  Do not use any tobacco products, such as cigarettes, chewing tobacco, and e-cigarettes. If you need help quitting, ask your health care provider.  Limit alcohol intake to no more than 2 drinks per day. One drink equals 12 ounces of beer, 5 ounces of wine, or 1 ounces of hard liquor.  Do not use street drugs.  Do not share needles.  Ask your health care provider for help if you need support or information about quitting drugs.  Tell your health care provider if you often feel depressed.  Tell your health care provider if you have ever been abused or do not feel safe at home. This information is not intended to replace advice given to you by your health care provider. Make sure you discuss any questions you have with your health care provider. Document Released: 08/11/2007 Document Revised: 10/12/2015 Document Reviewed: 11/16/2014 Elsevier Interactive Patient Education  [employer].

## 2017-08-13 NOTE — Progress Notes (Addendum)
Subjective:   Eric Richmond is a 77 y.o. male who presents for Medicare Annual/Subsequent preventive examination.  The Patient was informed that the wellness visit is to identify future health risk and educate and initiate measures that can reduce risk for increased disease through the lifespan.   Reports health as good   2018; c/o of memory issues; no failures of independent living  Still working in [place]  6 to 2:30;  He enjoys it for now; Good relationship with the boss  [place] dtr studying at [employer] from Last 3 Encounters:  08/13/17 200 lb 8 oz (90.9 kg)  07/29/17 201 lb 3.2 oz (91.3 kg)  05/06/17 205 lb 12.8 oz (93.4 kg)   BMI 30  Discussed  Lipid ration up from 3 to 4; hdl 41; trig 99   Ref. Range 06/02/2015 10:52 10/19/2016 12:27  Glucose Latest Ref Range: 70 - 99 mg/dL 106 (H) 94  Hemoglobin A1C Latest Ref Range: 4.6 - 6.5 % 6.5 6.5   Exercise Going back to the gym and working 3 days a week   Sleep patterns: no sleep issues and gets up 0-1 times nightly to void. Wears CPAP nightly.   Health Maintenance Due  Topic Date Due  . FOOT EXAM  07/09/1950  . OPHTHALMOLOGY EXAM  07/09/1950  . TETANUS/TDAP  07/09/1959  . HEMOGLOBIN A1C  04/21/2017   May want to go to a foot doctor   Eye exam - has apt coming up Put it off due to death in the family   Will see [name] in August    Eye Exam- Follows w/ eye doctor at [facility] in [place]. Last appt >1 year ago  Has bilateral hearing aids, but is not wearing them today and is noticeably hard of hearing. He follows w/ [facility] and plans to take his hearing aids in for adjustment soon.   CCS- last 08/07/16. 3 polyps removed, severe diverticulosis, internal hemorrhoids. Recall pending pathology results. Repeat in 07/2019  PSA- 09/2016  Educated regarding the shingrix        Objective:    Vitals: BP 136/80   Pulse (!) 50   Ht 5\' 8"  (1.727 m)   Wt 200 lb 8 oz  (90.9 kg)   SpO2 97%   BMI 30.49 kg/m   Body mass index is 30.49 kg/m.  Advanced Directives 08/09/2016 08/07/2016 06/18/2016 01/11/2014 11/07/2013 10/26/2013 10/19/2013  Does Patient Have a Medical Advance Directive? No Yes Yes No No No No  Type of Advance Directive - [place];Living will Living will - - - -  Would patient like information on creating a medical advance directive? Yes (MAU/Ambulatory/Procedural Areas - Information given) - - No - patient declined information No - patient declined information No - patient declined information No - patient declined information    Tobacco Social History   Tobacco Use  Smoking Status Former Smoker  . Packs/day: 0.30  . Years: 20.00  . Pack years: 6.00  . Types: Cigarettes  . Last attempt to quit: 02/26/1977  . Years since quitting: 40.4  Smokeless Tobacco Current User  . Types: Chew  Tobacco Comment   occ     Ready to quit: No Counseling given: Yes Comment: occ   Clinical Intake:   Past Medical History:  Diagnosis Date  . Allergy   . Anal fissure 20 years ago  . ED (erectile dysfunction)   . GERD (gastroesophageal  reflux disease)   . Hard of hearing   . OSA (obstructive sleep apnea)    per Dr. Gwenette Greet , cpap -2.0  . Osteoarthritis    OA  RIGHT KNEE AND RIGHT THUMB;  S/P LEFT TOTAL KNEE REPLACEMENT  . Pneumonia    spring 2015 - HAS NOT USED OR NEEDED INHALER SINCE   . Sleep apnea    uses CPAP  . Unspecified essential hypertension    Past Surgical History:  Procedure Laterality Date  . ANAL FISSURE REPAIR    . colonoscopy  08/07/2016   per Dr. Hilarie Fredrickson, adenomatous polyps removed, repeat in 3 years   . KNEE ARTHROSCOPY     left knee  . KNEE CLOSED REDUCTION Left 01/13/2014   Procedure: CLOSED MANIPULATION LEFT KNEE;  Surgeon: Gearlean Alf, MD;  Location: [facility];  Service: Orthopedics;  Laterality: Left;  . SURGERY AGE 67 OR 7 ON LEFT KNEE FOR A "GLAND BEHIND THE KNEE"    . TOTAL KNEE ARTHROPLASTY  Left 10/26/2013   Procedure: LEFT TOTAL KNEE ARTHROPLASTY;  Surgeon: Gearlean Alf, MD;  Location: [facility];  Service: Orthopedics;  Laterality: Left;   Family History  Problem Relation Age of Onset  . Breast cancer Sister   . Diabetes Sister   . Hypertension Sister   . Thyroid cancer Sister   . Diabetes Mother   . Hypertension Mother   . Diabetes Father   . Hypertension Father   . Diabetes Sister   . Hypertension Unknown   . Diabetes Brother   . Hypertension Brother   . Heart failure Brother   . Heart failure Brother   . Diabetes Sister   . Colon cancer Neg Hx   . Esophageal cancer Neg Hx   . Rectal cancer Neg Hx   . Stomach cancer Neg Hx    Social History   Socioeconomic History  . Marital status: Married    Spouse name: Not on file  . Number of children: Not on file  . Years of education: Not on file  . Highest education level: Not on file  Occupational History  . Occupation: Restaurant manager, fast food  . Financial resource strain: Not on file  . Food insecurity:    Worry: Not on file    Inability: Not on file  . Transportation needs:    Medical: Not on file    Non-medical: Not on file  Tobacco Use  . Smoking status: Former Smoker    Packs/day: 0.30    Years: 20.00    Pack years: 6.00    Types: Cigarettes    Last attempt to quit: 02/26/1977    Years since quitting: 40.4  . Smokeless tobacco: Current User    Types: Chew  . Tobacco comment: occ  Substance and Sexual Activity  . Alcohol use: No    Alcohol/week: 0.0 oz  . Drug use: No  . Sexual activity: Not on file  Lifestyle  . Physical activity:    Days per week: Not on file    Minutes per session: Not on file  . Stress: Not on file  Relationships  . Social connections:    Talks on phone: Not on file    Gets together: Not on file    Attends religious service: Not on file    Active member of club or organization: Not on file    Attends meetings of clubs or organizations: Not on file    Relationship  status: Not on file  Other  Topics Concern  . Not on file  Social History Narrative   Epworth Sleepiness Scale = 5 (12/14/2014)    Outpatient Encounter Medications as of 08/13/2017  Medication Sig  . aspirin EC 81 MG tablet Take 81 mg by mouth daily.  Marland Kitchen atenolol (TENORMIN) 50 MG tablet TAKE 1 TABLET EVERY DAY (NEED MD APPOINTMENT FOR REFILLS)  . losartan-hydrochlorothiazide (HYZAAR) 100-25 MG tablet TAKE 1 TABLET EVERY MORNING  . Multiple Vitamin (MULTIVITAMIN WITH MINERALS) TABS tablet Take 1 tablet by mouth daily.  Marland Kitchen omeprazole (PRILOSEC) 40 MG capsule TAKE 1 CAPSULE EVERY DAY  . PROAIR HFA 108 (90 BASE) MCG/ACT inhaler Inhale 1 puff into the lungs every 6 (six) hours as needed for wheezing or shortness of breath (pt states he has not used in "months").    No facility-administered encounter medications on file as of 08/13/2017.     Activities of Daily Living No flowsheet data found.  Patient Care Team: [name], MD as PCP - General   Assessment:   This is a routine wellness examination for [place].  Exercise Activities and Dietary recommendations    Goals    . Exercise 150 min/wk Moderate Activity     Will get back to the gym 3 days . This will help your pre -diabetic numbers   Educated regarding prediabetes and numbers;  A1c ranges from 5.8 to 6.5 or fasting Blood sugar > 115 -126; (126 is diabetic)   Risk: >45yo; family hx; overweight or obese; African American; Hispanic; Latino; American [place] American; [place]; history of diabetes when pregnant; or birth to a baby weighing over 9 lbs. Being less physically active than 30 minutes; 3 times a week;   Prevention; Losing a modest 7 to 8 lbs; If over 200 lbs; 10 to 14 lbs;  Choose healthier foods; colorful veggies; fish or lean meats; drinks water Reduce portion size Start exercising; 30 minutes of fast walking x 30 minutes per day/ 60 min for weight loss             Fall Risk Fall Risk   08/09/2016 11/18/2014  Falls in the past year? No No     Depression Screen PHQ 2/9 Scores 08/09/2016 11/18/2014  PHQ - 2 Score 1 0   States he stays busy   Cognitive Function MMSE - Mini Mental State Exam 08/09/2016  Orientation to time 4  Orientation to time comments Disoriented to date.  Orientation to Place 5  Registration 3  Attention/ Calculation 4  Recall 1  Language- name 2 objects 2  Language- repeat 1  Language- follow 3 step command 3  Language- read & follow direction 1  Write a sentence 1  Copy design 0  Total score 25       Ad8 score reviewed for issues:  Issues making decisions: no  Less interest in hobbies / activities: no and talks to everyone  Repeats questions, stories (family complaining):  Trouble using ordinary gadgets (microwave, computer, phone): no  Forgets the month or year:   Mismanaging finances:   Remembering appts:  Daily problems with thinking and/or memory: Ad8 score is=0 Difficult recalling a name    Serial 3s from 20 - 4/5  2/3 recall  Had difficulty with the clock  Orientation slightly off; date, season States he gets a little anxious if someone ask him questions Difficulty understanding tripping over something and falling   Immunization History  Administered Date(s) Administered  . Influenza Split 01/10/2013  . Influenza Whole 11/21/2009,  01/17/2010  . Influenza, High Dose Seasonal PF 11/18/2014, 12/30/2015, 12/27/2016  . Influenza,inj,Quad PF,6+ Mos 12/15/2013  . Pneumococcal Conjugate-13 12/15/2013  . Pneumococcal Polysaccharide-23 06/02/2015   Screening Tests Health Maintenance  Topic Date Due  . FOOT EXAM  07/09/1950  . OPHTHALMOLOGY EXAM  07/09/1950  . TETANUS/TDAP  07/09/1959  . HEMOGLOBIN A1C  04/21/2017  . INFLUENZA VACCINE  09/26/2017  . COLONOSCOPY  08/08/2019  . PNA vac Low Risk Adult  Completed        Plan:      PCP Notes   Health Maintenance Deferred foot exam but states he has no  issues. Education provided and instructed to see [name] for any blisters or cuts to his toes or feet. Denies foot pain, numbness etc  Eye exam - has apt coming up Put it off due to death in the family (wife's sister )   Will see [name] in August to fup on A1c at that time  Has bilateral hearing aids, but is wearing them today; still has some difficulty hearing. States they have helped him some   CCS- last 08/07/16. 3 polyps removed, severe diverticulosis, internal hemorrhoids. Recall pending pathology results. Repeat in 07/2019  PSA- 09/2016   Abnormal Screens  MMSE  26; Still working and very functional  Had some difficulty with the clock test;  but also has HOH with hearing aids.  Overall, about the same has last year  MMSE - Mini Mental State Exam 08/13/2017 08/09/2016  Orientation to time 4 4  Orientation to time comments - Disoriented to date.  Orientation to Place 5 5  Registration 3 3  Attention/ Calculation 4 4  Recall 2 1  Language- name 2 objects 2 2  Language- repeat 1 1  Language- follow 3 step command 3 3  Language- read & follow direction 1 1  Write a sentence 1 1  Copy design 0 0  Total score 26 25   Referrals none   Patient concerns; As noted  Nurse Concerns; As noted   Next PCP apt 10/21/17    I have personally reviewed and noted the following in the patient's chart:   . Medical and social history . Use of alcohol, tobacco or illicit drugs  . Current medications and supplements . Functional ability and status . Nutritional status . Physical activity . Advanced directives . List of other physicians . Hospitalizations, surgeries, and ER visits in previous 12 months . Vitals . Screenings to include cognitive, depression, and falls . Referrals and appointments  In addition, I have reviewed and discussed with patient certain preventive protocols, quality metrics, and best practice recommendations. A written personalized care plan for preventive  services as well as general preventive health recommendations were provided to patient.     YIFOY,DXAJO, RN  08/13/2017   I have read this note and agree with its contents. Alysia Penna, MD

## 2017-08-23 ENCOUNTER — Other Ambulatory Visit: Payer: Self-pay | Admitting: Family Medicine

## 2017-09-02 DIAGNOSIS — M179 Osteoarthritis of knee, unspecified: Secondary | ICD-10-CM | POA: Diagnosis not present

## 2017-09-02 DIAGNOSIS — G4733 Obstructive sleep apnea (adult) (pediatric): Secondary | ICD-10-CM | POA: Diagnosis not present

## 2017-10-03 DIAGNOSIS — M179 Osteoarthritis of knee, unspecified: Secondary | ICD-10-CM | POA: Diagnosis not present

## 2017-10-03 DIAGNOSIS — G4733 Obstructive sleep apnea (adult) (pediatric): Secondary | ICD-10-CM | POA: Diagnosis not present

## 2017-10-21 ENCOUNTER — Ambulatory Visit (INDEPENDENT_AMBULATORY_CARE_PROVIDER_SITE_OTHER): Payer: Medicare HMO | Admitting: Family Medicine

## 2017-10-21 ENCOUNTER — Encounter: Payer: Self-pay | Admitting: Family Medicine

## 2017-10-21 VITALS — BP 174/110 | HR 58 | Temp 97.7°F | Ht 67.25 in | Wt 201.4 lb

## 2017-10-21 DIAGNOSIS — G4733 Obstructive sleep apnea (adult) (pediatric): Secondary | ICD-10-CM | POA: Diagnosis not present

## 2017-10-21 DIAGNOSIS — N401 Enlarged prostate with lower urinary tract symptoms: Secondary | ICD-10-CM

## 2017-10-21 DIAGNOSIS — M150 Primary generalized (osteo)arthritis: Secondary | ICD-10-CM

## 2017-10-21 DIAGNOSIS — K219 Gastro-esophageal reflux disease without esophagitis: Secondary | ICD-10-CM

## 2017-10-21 DIAGNOSIS — E119 Type 2 diabetes mellitus without complications: Secondary | ICD-10-CM | POA: Diagnosis not present

## 2017-10-21 DIAGNOSIS — M159 Polyosteoarthritis, unspecified: Secondary | ICD-10-CM

## 2017-10-21 DIAGNOSIS — N138 Other obstructive and reflux uropathy: Secondary | ICD-10-CM | POA: Diagnosis not present

## 2017-10-21 DIAGNOSIS — I10 Essential (primary) hypertension: Secondary | ICD-10-CM | POA: Diagnosis not present

## 2017-10-21 LAB — LIPID PANEL
CHOL/HDL RATIO: 3
Cholesterol: 135 mg/dL (ref 0–200)
HDL: 51.8 mg/dL (ref >39.00)
LDL Cholesterol: 72 mg/dL (ref 0–99)
NonHDL: 82.82
TRIGLYCERIDES: 54 mg/dL (ref 0.0–149.0)
VLDL: 10.8 mg/dL (ref 0.0–40.0)

## 2017-10-21 LAB — CBC WITH DIFFERENTIAL/PLATELET
BASOS ABS: 0 10*3/uL (ref 0.0–0.1)
Basophils Relative: 0.6 % (ref 0.0–3.0)
Eosinophils Absolute: 0.2 10*3/uL (ref 0.0–0.7)
Eosinophils Relative: 3.3 % (ref 0.0–5.0)
HCT: 41.5 % (ref 39.0–52.0)
HEMOGLOBIN: 14.1 g/dL (ref 13.0–17.0)
LYMPHS ABS: 1.7 10*3/uL (ref 0.7–4.0)
Lymphocytes Relative: 32.7 % (ref 12.0–46.0)
MCHC: 34 g/dL (ref 30.0–36.0)
MCV: 87.8 fl (ref 78.0–100.0)
Monocytes Absolute: 0.3 10*3/uL (ref 0.1–1.0)
Monocytes Relative: 5.7 % (ref 3.0–12.0)
NEUTROS PCT: 57.7 % (ref 43.0–77.0)
Neutro Abs: 3.1 10*3/uL (ref 1.4–7.7)
Platelets: 141 10*3/uL — ABNORMAL LOW (ref 150.0–400.0)
RBC: 4.73 Mil/uL (ref 4.22–5.81)
RDW: 13.9 % (ref 11.5–15.5)
WBC: 5.3 10*3/uL (ref 4.0–10.5)

## 2017-10-21 LAB — HEPATIC FUNCTION PANEL
ALT: 13 U/L (ref 0–53)
AST: 18 U/L (ref 0–37)
Albumin: 3.9 g/dL (ref 3.5–5.2)
Alkaline Phosphatase: 78 U/L (ref 39–117)
BILIRUBIN DIRECT: 0.1 mg/dL (ref 0.0–0.3)
TOTAL PROTEIN: 6.5 g/dL (ref 6.0–8.3)
Total Bilirubin: 0.8 mg/dL (ref 0.2–1.2)

## 2017-10-21 LAB — PSA: PSA: 1.56 ng/mL (ref 0.10–4.00)

## 2017-10-21 LAB — BASIC METABOLIC PANEL
BUN: 15 mg/dL (ref 6–23)
CO2: 28 mEq/L (ref 19–32)
CREATININE: 0.99 mg/dL (ref 0.40–1.50)
Calcium: 9.3 mg/dL (ref 8.4–10.5)
Chloride: 108 mEq/L (ref 96–112)
GFR: 94.2 mL/min (ref >60.00)
Glucose, Bld: 109 mg/dL — ABNORMAL HIGH (ref 70–99)
Potassium: 4 mEq/L (ref 3.5–5.1)
Sodium: 142 mEq/L (ref 135–145)

## 2017-10-21 LAB — POC URINALSYSI DIPSTICK (AUTOMATED)
Bilirubin, UA: NEGATIVE
Glucose, UA: NEGATIVE
KETONES UA: NEGATIVE
Leukocytes, UA: NEGATIVE
Nitrite, UA: NEGATIVE
PH UA: 7 (ref 5.0–8.0)
PROTEIN UA: NEGATIVE
RBC UA: NEGATIVE
SPEC GRAV UA: 1.02 (ref 1.010–1.025)
UROBILINOGEN UA: 0.2 U/dL

## 2017-10-21 LAB — TSH: TSH: 1.29 u[IU]/mL (ref 0.35–4.50)

## 2017-10-21 LAB — HEMOGLOBIN A1C: HEMOGLOBIN A1C: 6.3 % (ref 4.6–6.5)

## 2017-10-21 MED ORDER — LOSARTAN POTASSIUM-HCTZ 100-25 MG PO TABS: 1.0000 | ORAL_TABLET | Freq: Every morning | ORAL | 3 refills | Status: DC

## 2017-10-21 MED ORDER — FLUTICASONE PROPIONATE 50 MCG/ACT NA SUSP: 2.0000 | Freq: Every day | NASAL | 3 refills | Status: DC

## 2017-10-21 NOTE — Progress Notes (Signed)
   Subjective:    Patient ID: Eric Richmond, male    DOB: Sep 09, 1940, 77 y.o.   MRN: [id number]  HPI Here to follow up on issues. He feels well. He ran out of Losartan HCT about 4 days ago and thus his BP is high today. He ordered a new supply from [facility] and hopefully it will arrive today. He still works out at [employer] several days a week. His arthritis and GERD are stable.    Review of Systems  Constitutional: Negative.   HENT: Negative.   Eyes: Negative.   Respiratory: Negative.   Cardiovascular: Negative.   Gastrointestinal: Negative.   Genitourinary: Negative.   Musculoskeletal: Negative.   Skin: Negative.   Neurological: Negative.   Psychiatric/Behavioral: Negative.        Objective:   Physical Exam  Constitutional: He is oriented to person, place, and time. He appears well-developed and well-nourished. No distress.  HENT:  Head: Normocephalic and atraumatic.  Right Ear: External ear normal.  Left Ear: External ear normal.  Nose: Nose normal.  Mouth/Throat: Oropharynx is clear and moist. No oropharyngeal exudate.  Eyes: Pupils are equal, round, and reactive to light. Conjunctivae and EOM are normal. Right eye exhibits no discharge. Left eye exhibits no discharge. No scleral icterus.  Neck: Neck supple. No JVD present. No tracheal deviation present. No thyromegaly present.  Cardiovascular: Normal rate, regular rhythm, normal heart sounds and intact distal pulses. Exam reveals no gallop and no friction rub.  No murmur heard. Pulmonary/Chest: Effort normal and breath sounds normal. No respiratory distress. He has no wheezes. He has no rales. He exhibits no tenderness.  Abdominal: Soft. Bowel sounds are normal. He exhibits no distension and no mass. There is no tenderness. There is no rebound and no guarding.  Genitourinary: Rectum normal, prostate normal and penis normal. Rectal exam shows guaiac negative stool. No penile tenderness.  Musculoskeletal: Normal range of  motion. He exhibits no edema or tenderness.  Lymphadenopathy:    He has no cervical adenopathy.  Neurological: He is alert and oriented to person, place, and time. He has normal reflexes. He displays normal reflexes. No cranial nerve deficit. He exhibits normal muscle tone. Coordination normal.  Skin: Skin is warm and dry. No rash noted. He is not diaphoretic. No erythema. No pallor.  Psychiatric: He has a normal mood and affect. His behavior is normal. Judgment and thought content normal.          Assessment & Plan:  His HTN is probably well controlled, since he says it always looks good at home. hopefully his new supply of meds will arrive soon. His arthritis and GERD are stable. We will get fasting labs today to check an A1c, lipids, etc.  Alysia Penna, MD

## 2017-11-03 DIAGNOSIS — M179 Osteoarthritis of knee, unspecified: Secondary | ICD-10-CM | POA: Diagnosis not present

## 2017-11-03 DIAGNOSIS — G4733 Obstructive sleep apnea (adult) (pediatric): Secondary | ICD-10-CM | POA: Diagnosis not present

## 2017-12-03 DIAGNOSIS — G4733 Obstructive sleep apnea (adult) (pediatric): Secondary | ICD-10-CM | POA: Diagnosis not present

## 2017-12-03 DIAGNOSIS — M179 Osteoarthritis of knee, unspecified: Secondary | ICD-10-CM | POA: Diagnosis not present

## 2018-01-03 DIAGNOSIS — M179 Osteoarthritis of knee, unspecified: Secondary | ICD-10-CM | POA: Diagnosis not present

## 2018-01-03 DIAGNOSIS — G4733 Obstructive sleep apnea (adult) (pediatric): Secondary | ICD-10-CM | POA: Diagnosis not present

## 2018-02-02 DIAGNOSIS — G4733 Obstructive sleep apnea (adult) (pediatric): Secondary | ICD-10-CM | POA: Diagnosis not present

## 2018-02-02 DIAGNOSIS — M179 Osteoarthritis of knee, unspecified: Secondary | ICD-10-CM | POA: Diagnosis not present

## 2018-02-07 ENCOUNTER — Ambulatory Visit (INDEPENDENT_AMBULATORY_CARE_PROVIDER_SITE_OTHER): Payer: Medicare HMO | Admitting: Family Medicine

## 2018-02-07 ENCOUNTER — Encounter: Payer: Self-pay | Admitting: Neurology

## 2018-02-07 ENCOUNTER — Encounter: Payer: Self-pay | Admitting: Family Medicine

## 2018-02-07 VITALS — BP 138/80 | HR 50 | Temp 97.6°F | Wt 189.4 lb

## 2018-02-07 DIAGNOSIS — R413 Other amnesia: Secondary | ICD-10-CM

## 2018-02-07 DIAGNOSIS — R634 Abnormal weight loss: Secondary | ICD-10-CM | POA: Diagnosis not present

## 2018-02-07 LAB — CBC WITH DIFFERENTIAL/PLATELET
BASOS PCT: 0.5 % (ref 0.0–3.0)
Basophils Absolute: 0 10*3/uL (ref 0.0–0.1)
EOS PCT: 2.6 % (ref 0.0–5.0)
Eosinophils Absolute: 0.1 10*3/uL (ref 0.0–0.7)
HCT: 42.3 % (ref 39.0–52.0)
Hemoglobin: 14.4 g/dL (ref 13.0–17.0)
LYMPHS ABS: 1.7 10*3/uL (ref 0.7–4.0)
Lymphocytes Relative: 29.6 % (ref 12.0–46.0)
MCHC: 34 g/dL (ref 30.0–36.0)
MCV: 87.5 fl (ref 78.0–100.0)
MONO ABS: 0.3 10*3/uL (ref 0.1–1.0)
MONOS PCT: 5.5 % (ref 3.0–12.0)
NEUTROS ABS: 3.5 10*3/uL (ref 1.4–7.7)
NEUTROS PCT: 61.8 % (ref 43.0–77.0)
Platelets: 171 10*3/uL (ref 150.0–400.0)
RBC: 4.84 Mil/uL (ref 4.22–5.81)
RDW: 14.4 % (ref 11.5–15.5)
WBC: 5.7 10*3/uL (ref 4.0–10.5)

## 2018-02-07 LAB — BASIC METABOLIC PANEL
BUN: 17 mg/dL (ref 6–23)
CHLORIDE: 103 meq/L (ref 96–112)
CO2: 27 mEq/L (ref 19–32)
Calcium: 9.5 mg/dL (ref 8.4–10.5)
Creatinine, Ser: 0.97 mg/dL (ref 0.40–1.50)
GFR: 96.37 mL/min (ref >60.00)
Glucose, Bld: 102 mg/dL — ABNORMAL HIGH (ref 70–99)
POTASSIUM: 4.3 meq/L (ref 3.5–5.1)
SODIUM: 139 meq/L (ref 135–145)

## 2018-02-07 LAB — HEPATIC FUNCTION PANEL
ALT: 12 U/L (ref 0–53)
AST: 19 U/L (ref 0–37)
Albumin: 4.2 g/dL (ref 3.5–5.2)
Alkaline Phosphatase: 77 U/L (ref 39–117)
BILIRUBIN TOTAL: 0.8 mg/dL (ref 0.2–1.2)
Bilirubin, Direct: 0.2 mg/dL (ref 0.0–0.3)
Total Protein: 6.7 g/dL (ref 6.0–8.3)

## 2018-02-07 LAB — TSH: TSH: 0.95 u[IU]/mL (ref 0.35–4.50)

## 2018-02-07 LAB — HEMOGLOBIN A1C: Hgb A1c MFr Bld: 6.4 % (ref 4.6–6.5)

## 2018-02-07 MED ORDER — ALBUTEROL SULFATE HFA 108 (90 BASE) MCG/ACT IN AERS: 2.0000 | INHALATION_SPRAY | RESPIRATORY_TRACT | 11 refills | Status: DC | PRN

## 2018-02-07 NOTE — Progress Notes (Signed)
   Subjective:    Patient ID: Eric Richmond, male    DOB: Jan 03, 1941, 77 y.o.   MRN: [id number]  HPI Here with his wife for several concerns of hers. First he has lost some weight although he is not trying to. He thinks he is eating less than he used to but she disagrees. Per his chart he has lost 12 lbs since August. He had normal labs, inlcuding an A1c of 6.3, in August. Also she is concerned about his memory. He is getting more forgetful, especially in the past 6 months.    Review of Systems  Constitutional: Positive for unexpected weight change. Negative for activity change and appetite change.  Respiratory: Negative.   Cardiovascular: Negative.   Gastrointestinal: Negative.   Genitourinary: Negative.   Neurological: Negative.        Objective:   Physical Exam Constitutional:      Appearance: Normal appearance.  Neck:     Musculoskeletal: Neck supple.  Cardiovascular:     Rate and Rhythm: Normal rate and regular rhythm.     Pulses: Normal pulses.     Heart sounds: Normal heart sounds.  Pulmonary:     Effort: Pulmonary effort is normal.     Breath sounds: Normal breath sounds.  Lymphadenopathy:     Cervical: No cervical adenopathy.  Neurological:     Mental Status: He is alert and oriented to person, place, and time.           Assessment & Plan:  Unintentional weight loss, we will check labs including an A1c. For the memory loss, refer to Neurology. Alysia Penna, MD

## 2018-02-11 ENCOUNTER — Other Ambulatory Visit: Payer: Self-pay | Admitting: Family Medicine

## 2018-02-11 MED ORDER — ALBUTEROL SULFATE HFA 108 (90 BASE) MCG/ACT IN AERS: 2.0000 | INHALATION_SPRAY | Freq: Four times a day (QID) | RESPIRATORY_TRACT | 11 refills | Status: DC | PRN

## 2018-03-05 DIAGNOSIS — G4733 Obstructive sleep apnea (adult) (pediatric): Secondary | ICD-10-CM | POA: Diagnosis not present

## 2018-03-05 DIAGNOSIS — M179 Osteoarthritis of knee, unspecified: Secondary | ICD-10-CM | POA: Diagnosis not present

## 2018-04-05 DIAGNOSIS — M179 Osteoarthritis of knee, unspecified: Secondary | ICD-10-CM | POA: Diagnosis not present

## 2018-04-05 DIAGNOSIS — G4733 Obstructive sleep apnea (adult) (pediatric): Secondary | ICD-10-CM | POA: Diagnosis not present

## 2018-04-25 ENCOUNTER — Encounter: Payer: Self-pay | Admitting: Neurology

## 2018-04-25 ENCOUNTER — Ambulatory Visit: Payer: Medicare HMO | Admitting: Neurology

## 2018-04-25 ENCOUNTER — Other Ambulatory Visit: Payer: Self-pay

## 2018-04-25 ENCOUNTER — Other Ambulatory Visit (INDEPENDENT_AMBULATORY_CARE_PROVIDER_SITE_OTHER): Payer: Medicare HMO

## 2018-04-25 ENCOUNTER — Encounter

## 2018-04-25 VITALS — BP 132/74 | HR 60 | Ht 68.0 in | Wt 197.0 lb

## 2018-04-25 DIAGNOSIS — R413 Other amnesia: Secondary | ICD-10-CM

## 2018-04-25 DIAGNOSIS — G3184 Mild cognitive impairment, so stated: Secondary | ICD-10-CM

## 2018-04-25 LAB — TSH: TSH: 1.04 mIU/L (ref 0.40–4.50)

## 2018-04-25 LAB — VITAMIN B12: Vitamin B-12: 784 pg/mL (ref 200–1100)

## 2018-04-25 NOTE — Patient Instructions (Addendum)
1. Bloodwork for TSH, B12  Your provider requests that you have LABS drawn today.  We share a lab with [facility] - they are located in [address] (second floor) of this building.  Once you get there, please have a seat and the phlebotomist will call your name.  If you have waited more than 15 minutes, please advise the front desk   2. Schedule open MRI brain without contrast  We have sent a referral for your OPEN MRI to [facility].  They will contact you directly to schedule your appointment.  [facility] is located at [address], [place], [place] [address].  If you need to speak with [facility] for any reason, they can be reached at [phone]  3. Start Donepezil (Aricept) 10mg : Take 1/2 tablet daily for 2 weeks, then increase to 1 tablet daily 4. Continue to monitor driving 5. Follow-up in 6 months, call for any changes  FALL PRECAUTIONS: Be cautious when walking. Scan the area for obstacles that may increase the risk of trips and falls. When getting up in the mornings, sit up at the edge of the bed for a few minutes before getting out of bed. Consider elevating the bed at the head end to avoid drop of blood pressure when getting up. Walk always in a well-lit room (use night lights in the walls). Avoid area rugs or power cords from appliances in the middle of the walkways. Use a walker or a cane if necessary and consider physical therapy for balance exercise. Get your eyesight checked regularly.  HOME SAFETY: Consider the safety of the kitchen when operating appliances like stoves, microwave oven, and blender. Consider having supervision and share cooking responsibilities until no longer able to participate in those. Accidents with firearms and other hazards in the house should be identified and addressed as well.  DRIVING: Regarding driving, in patients with progressive memory problems, driving will be impaired. We advise to have someone else do the driving if trouble  finding directions or if minor accidents are reported. Independent driving assessment is available to determine safety of driving.  ABILITY TO BE LEFT ALONE: If patient is unable to contact 911 operator, consider using LifeLine, or when the need is there, arrange for someone to stay with patients. Smoking is a fire hazard, consider supervision or cessation. Risk of wandering should be assessed by caregiver and if detected at any point, supervision and safe proof recommendations should be instituted.  MEDICATION SUPERVISION: Inability to self-administer medication needs to be constantly addressed. Implement a mechanism to ensure safe administration of the medications.  RECOMMENDATIONS FOR ALL PATIENTS WITH MEMORY PROBLEMS: 1. Continue to exercise (Recommend 30 minutes of walking everyday, or 3 hours every week) 2. Increase social interactions - continue going to [place] and enjoy social gatherings with friends and family 3. Eat healthy, avoid fried foods and eat more fruits and vegetables 4. Maintain adequate blood pressure, blood sugar, and blood cholesterol level. Reducing the risk of stroke and cardiovascular disease also helps promoting better memory. 5. Avoid stressful situations. Live a simple life and avoid aggravations. Organize your time and prepare for the next day in anticipation. 6. Sleep well, avoid any interruptions of sleep and avoid any distractions in the bedroom that may interfere with adequate sleep quality 7. Avoid sugar, avoid sweets as there is a strong link between excessive sugar intake, diabetes, and cognitive impairment We discussed the Mediterranean diet, which has been shown to help patients reduce the risk of progressive memory  disorders and reduces cardiovascular risk. This includes eating fish, eat fruits and green leafy vegetables, nuts like almonds and hazelnuts, walnuts, and also use olive oil. Avoid fast foods and fried foods as much as possible. Avoid sweets and sugar  as sugar use has been linked to worsening of memory function.  There is always a concern of gradual progression of memory problems. If this is the case, then we may need to adjust level of care according to patient needs. Support, both to the patient and caregiver, should then be put into place.

## 2018-04-25 NOTE — Progress Notes (Signed)
NEUROLOGY CONSULTATION NOTE  Eric Richmond MRN: [id number] DOB: Mar 03, 1940  Referring provider: Dr. Alysia Penna Primary care provider: Dr. Alysia Penna  Reason for consult:  Memory loss  Dear Dr Sarajane Jews:  Thank you for your kind referral of [place] for consultation of the above symptoms. Although his history is well known to you, please allow me to reiterate it for the purpose of our medical record. The patient was accompanied to the clinic by his wife who also provides collateral information. Records and images were personally reviewed where available.  HISTORY OF PRESENT ILLNESS: This is a very pleasant 78 year old right-handed man with a history of hypertension, sleep apnea on CPAP, presenting for evaluation of worsening memory. He states his memory is not as good as it had been. His wife started noticing memory changes around 6 months ago, noting that he is very forgetful. He forgets where he puts things or does not remember the meal he ate prior. She feels his forgetfulness is just more than usual. He occasionally repeats himself. They both agree that he is good with managing his own medications, he rarely forgets them. 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 He denies any difficulties with driving and work. He got lost briefly a week ago in an unfamiliar road, he got "a little excited" but was able to find his way back within 5 minutes. His wife denies any other driving concerns. Wife manages finances. He is independent with dressing and bathing. His wife has noticed he is a little more stressed out and "gets really tight" when he cannot find something. No paranoia or hallucinations. Sleep is good with CPAP, no wandering behavior.  He has frontal headaches when using a machine with smoke coming out. He has occasional dizziness when standing. He has occasional back pain from a pinched nerve. He has had a decreased sense of smell  for some time. No diplopia, dysarthria/dysphagia, neck pain, focal numbness/tingling/weakness, bowel/bladder dysfunction, tremors. His father had memory issues. He occasionally drinks a little wine. No significant head injuries.   PAST MEDICAL HISTORY: Past Medical History:  Diagnosis Date  . Allergy   . Anal fissure 20 years ago  . ED (erectile dysfunction)   . GERD (gastroesophageal reflux disease)   . Hard of hearing   . OSA (obstructive sleep apnea)    per Dr. Gwenette Greet , cpap -2.0  . Osteoarthritis    OA  RIGHT KNEE AND RIGHT THUMB;  S/P LEFT TOTAL KNEE REPLACEMENT  . Pneumonia    spring 2015 - HAS NOT USED OR NEEDED INHALER SINCE   . Sleep apnea    uses CPAP  . Unspecified essential hypertension     PAST SURGICAL HISTORY: Past Surgical History:  Procedure Laterality Date  . ANAL FISSURE REPAIR    . colonoscopy  08/07/2016   per Dr. Hilarie Fredrickson, adenomatous polyps removed, repeat in 3 years   . KNEE ARTHROSCOPY     left knee  . KNEE CLOSED REDUCTION Left 01/13/2014   Procedure: CLOSED MANIPULATION LEFT KNEE;  Surgeon: Gearlean Alf, MD;  Location: [facility];  Service: Orthopedics;  Laterality: Left;  . SURGERY AGE 57 OR 7 ON LEFT KNEE FOR A "GLAND BEHIND THE KNEE"    . TOTAL KNEE ARTHROPLASTY Left 10/26/2013   Procedure: LEFT TOTAL KNEE ARTHROPLASTY;  Surgeon: Gearlean Alf, MD;  Location: [facility];  Service: Orthopedics;  Laterality: Left;  MEDICATIONS: Current Outpatient Medications on File Prior to Visit  Medication Sig Dispense Refill  . albuterol (VENTOLIN HFA) 108 (90 Base) MCG/ACT inhaler Inhale 2 puffs into the lungs every 6 (six) hours as needed for wheezing or shortness of breath. 1 Inhaler 11  . aspirin EC 81 MG tablet Take 81 mg by mouth daily.    Marland Kitchen atenolol (TENORMIN) 50 MG tablet TAKE 1 TABLET EVERY DAY (NEED MD APPOINTMENT FOR REFILLS) 90 tablet 3  . fluticasone (FLONASE) 50 MCG/ACT nasal spray Place 2 sprays into both nostrils daily. 48 g 3  .  losartan-hydrochlorothiazide (HYZAAR) 100-25 MG tablet Take 1 tablet by mouth every morning. 90 tablet 3  . Multiple Vitamin (MULTIVITAMIN WITH MINERALS) TABS tablet Take 1 tablet by mouth daily.    Marland Kitchen omeprazole (PRILOSEC) 40 MG capsule TAKE 1 CAPSULE EVERY DAY 90 capsule 3   No current facility-administered medications on file prior to visit.     ALLERGIES: Allergies  Allergen Reactions  . Oxycodone     Hallucinations   . Tramadol     Hallucinations     FAMILY HISTORY: Family History  Problem Relation Age of Onset  . Breast cancer Sister   . Diabetes Sister   . Hypertension Sister   . Thyroid cancer Sister   . Diabetes Mother   . Hypertension Mother   . Diabetes Father   . Hypertension Father   . Diabetes Sister   . Hypertension Unknown   . Diabetes Brother   . Hypertension Brother   . Heart failure Brother   . Heart failure Brother   . Diabetes Sister   . Colon cancer Neg Hx   . Esophageal cancer Neg Hx   . Rectal cancer Neg Hx   . Stomach cancer Neg Hx     SOCIAL HISTORY: Social History   Socioeconomic History  . Marital status: Married    Spouse name: Not on file  . Number of children: Not on file  . Years of education: Not on file  . Highest education level: Not on file  Occupational History  . Occupation: Restaurant manager, fast food  . Financial resource strain: Not on file  . Food insecurity:    Worry: Not on file    Inability: Not on file  . Transportation needs:    Medical: Not on file    Non-medical: Not on file  Tobacco Use  . Smoking status: Former Smoker    Packs/day: 0.30    Years: 20.00    Pack years: 6.00    Types: Cigarettes    Last attempt to quit: 02/26/1977    Years since quitting: 41.1  . Smokeless tobacco: Current User    Types: Chew  . Tobacco comment: Aged out of [employer]   Substance and Sexual Activity  . Alcohol use: No    Alcohol/week: 0.0 standard drinks  . Drug use: No  . Sexual activity: Not on file  Lifestyle  . Physical  activity:    Days per week: Not on file    Minutes per session: Not on file  . Stress: Not on file  Relationships  . Social connections:    Talks on phone: Not on file    Gets together: Not on file    Attends religious service: Not on file    Active member of club or organization: Not on file    Attends meetings of clubs or organizations: Not on file    Relationship status: Not on file  . Intimate  partner violence:    Fear of current or ex partner: Not on file    Emotionally abused: Not on file    Physically abused: Not on file    Forced sexual activity: Not on file  Other Topics Concern  . Not on file  Social History Narrative   Epworth Sleepiness Scale = 5 (12/14/2014)    REVIEW OF SYSTEMS: Constitutional: No fevers, chills, or sweats, no generalized fatigue, change in appetite Eyes: No visual changes, double vision, eye pain Ear, nose and throat: No hearing loss, ear pain, nasal congestion, sore throat Cardiovascular: No chest pain, palpitations Respiratory:  No shortness of breath at rest or with exertion, wheezes GastrointestinaI: No nausea, vomiting, diarrhea, abdominal pain, fecal incontinence Genitourinary:  No dysuria, urinary retention or frequency Musculoskeletal:  No neck pain, +back pain Integumentary: No rash, pruritus, skin lesions Neurological: as above Psychiatric: No depression, insomnia, anxiety Endocrine: No palpitations, fatigue, diaphoresis, mood swings, change in appetite, change in weight, increased thirst Hematologic/Lymphatic:  No anemia, purpura, petechiae. Allergic/Immunologic: no itchy/runny eyes, nasal congestion, recent allergic reactions, rashes  PHYSICAL EXAM: Vitals:   04/25/18 0950  BP: 132/74  Pulse: 60  SpO2: 96%   General: No acute distress Head:  Normocephalic/atraumatic Eyes: Fundoscopic exam shows bilateral sharp discs, no vessel changes, exudates, or hemorrhages Neck: supple, no paraspinal tenderness, full range of  motion Back: No paraspinal tenderness Heart: regular rate and rhythm Lungs: Clear to auscultation bilaterally. Vascular: No carotid bruits. Skin/Extremities: No rash, no edema Neurological Exam: Mental status: alert and oriented to person, city, year. No dysarthria or aphasia, Fund of knowledge is appropriate.  Recent and remote memory are impaired.  Attention and concentration are reduced.    Able to name objects and repeat phrases, although noted to make paraphasic errors. Montreal Cognitive Assessment  04/25/2018  Visuospatial/ Executive (0/5) 1  Naming (0/3) 3  Attention: Read list of digits (0/2) 1  Attention: Read list of letters (0/1) 1  Attention: Serial 7 subtraction starting at 100 (0/3) 1  Language: Repeat phrase (0/2) 1  Language : Fluency (0/1) 0  Abstraction (0/2) 0  Delayed Recall (0/5) 0  Orientation (0/6) 2  Total 10  Adjusted Score (based on education) 11   Cranial nerves: CN I: not tested CN II: pupils equal, round and reactive to light, visual fields intact, fundi unremarkable. CN III, IV, VI:  full range of motion, no nystagmus, no ptosis CN V: facial sensation intact CN VII: upper and lower face symmetric CN VIII: hearing intact to finger rub CN IX, X: gag intact, uvula midline CN XI: sternocleidomastoid and trapezius muscles intact CN XII: tongue midline Bulk & Tone: normal, no fasciculations. Motor: 5/5 throughout with no pronator drift. Sensation: intact to light touch, cold, pin on both UE, decreased cold on left LE (since knee surgery). intact vibration and joint position sense.  No extinction to double simultaneous stimulation.  Romberg test negative Deep Tendon Reflexes: +2 throughout except for left knee, no ankle clonus Plantar responses: downgoing bilaterally Cerebellar: no incoordination on finger to nose testing Gait: slightly wide-based, favoring left leg (due to left knee issues). No ataxia, unable to tandem walk Tremor:  none  IMPRESSION: This is a pleasant 78 year old right-handed man with a history of hypertension, sleep apnea on CPAP, presenting for evaluation of worsening memory. His neurological exam is non-focal, MOCA score today 11/30. Although MOCA score raises concern for mild dementia, he and his wife deny any difficulties with complex tasks, indicating Mild  Cognitive Impairment. We discussed that Mild cognitive impairment means there are serious cognitive problems by report and testing but the patient is functioning normally. Around 50% of MCI patients progress to dementia (functional impairment) over 5 years. Dementia implies that ADLs are currently compromised. We discussed different causes of memory changes, check TSH and B12. MRI brain without contrast will be ordered to assess for underlying structural abnormality. We discussed starting Donepezil 10mg  1/2 tab daily for 2 weeks, then increase to 1 tab daily. Side effects and expectations from the medication were discussed. Continue to monitor driving. Follow-up in 6 months, they know to call for any changes.   Thank you for allowing me to participate in the care of this patient. Please do not hesitate to call for any questions or concerns.   Ellouise Newer, M.D.  CC: [name]

## 2018-04-28 ENCOUNTER — Telehealth: Payer: Self-pay | Admitting: Neurology

## 2018-04-28 NOTE — Telephone Encounter (Signed)
Prescription for the donzepam? 10mg  new medication to the [facility] on [address] and then [place]. Please send. Thanks!

## 2018-04-29 DIAGNOSIS — H353131 Nonexudative age-related macular degeneration, bilateral, early dry stage: Secondary | ICD-10-CM | POA: Diagnosis not present

## 2018-04-29 DIAGNOSIS — H40013 Open angle with borderline findings, low risk, bilateral: Secondary | ICD-10-CM | POA: Diagnosis not present

## 2018-04-29 DIAGNOSIS — H2513 Age-related nuclear cataract, bilateral: Secondary | ICD-10-CM | POA: Diagnosis not present

## 2018-04-29 DIAGNOSIS — H25013 Cortical age-related cataract, bilateral: Secondary | ICD-10-CM | POA: Diagnosis not present

## 2018-04-30 MED ORDER — DONEPEZIL HCL 10 MG PO TABS: 10.0000 mg | ORAL_TABLET | Freq: Every day | ORAL | 1 refills | Status: DC

## 2018-04-30 MED ORDER — DONEPEZIL HCL 10 MG PO TABS: ORAL_TABLET | ORAL | 0 refills | Status: DC

## 2018-04-30 NOTE — Telephone Encounter (Signed)
Donepezil RX sent to pharmacies as requested.  Made aware of lab results.

## 2018-04-30 NOTE — Telephone Encounter (Signed)
-----   Message from Cameron Sprang, MD sent at 04/28/2018  8:48 AM EST ----- Pls let him know thyroid and B12 levels are normal. Thanks

## 2018-05-04 DIAGNOSIS — M179 Osteoarthritis of knee, unspecified: Secondary | ICD-10-CM | POA: Diagnosis not present

## 2018-05-04 DIAGNOSIS — G4733 Obstructive sleep apnea (adult) (pediatric): Secondary | ICD-10-CM | POA: Diagnosis not present

## 2018-05-19 ENCOUNTER — Ambulatory Visit: Payer: Self-pay

## 2018-05-19 NOTE — Telephone Encounter (Signed)
Called and spoke with pts wife and she is aware that the appt tomorrow will be via telephone.

## 2018-05-19 NOTE — Telephone Encounter (Signed)
Pt. left a voice message on the COVID 19 question line. Reported he has had a sore throat intermittently for 4 weeks.  Stated the sore throat is mild.  Denied fever, neck tenderness, cough, or any other symptoms.  Is able to eat and drink without any swallowing difficulty.  Reported he chews tobacco.  Appt. Sched. With PCP 05/20/18 @ 3:30 PM, due to pt's work schedule.  Care advice given per protocol.  Verb. Understanding.  Pt. Is willing to do Telephonic appt. @ [phone].  If PCP recommends this.  Does not have access to internet.       Reason for Disposition . [1] Sore throat is the only symptom AND [2] present > 48 hours  Answer Assessment - Initial Assessment Questions 1. ONSET: "When did the throat start hurting?" (Hours or days ago)      About 4 weeks ago  2. SEVERITY: "How bad is the sore throat?" (Scale 1-10; mild, moderate or severe)   - MILD (1-3):  doesn't interfere with eating or normal activities   - MODERATE (4-7): interferes with eating some solids and normal activities   - SEVERE (8-10):  excruciating pain, interferes with most normal activities   - SEVERE DYSPHAGIA: can't swallow liquids, drooling     Mild  3. STREP EXPOSURE: "Has there been any exposure to strep within the past week?" If so, ask: "What type of contact occurred?"      Denied  4.  VIRAL SYMPTOMS: "Are there any symptoms of a cold, such as a runny nose, cough, hoarse voice or red eyes?"      Denied fever, cough, nasal congestion comes and goes    5. FEVER: "Do you have a fever?" If so, ask: "What is your temperature, how was it measured, and when did it start?"     Denied  6. PUS ON THE TONSILS: "Is there pus on the tonsils in the back of your throat?"    Denied  7. OTHER SYMPTOMS: "Do you have any other symptoms?" (e.g., difficulty breathing, headache, rash)     Denied any neck tenderness, denied fever, denied cough  8. PREGNANCY: "Is there any chance you are pregnant?" "When was your last menstrual  period?"     N/a  Protocols used: SORE THROAT-A-AH

## 2018-05-20 ENCOUNTER — Other Ambulatory Visit: Payer: Self-pay

## 2018-05-20 ENCOUNTER — Encounter: Payer: Self-pay | Admitting: Family Medicine

## 2018-05-20 ENCOUNTER — Ambulatory Visit (INDEPENDENT_AMBULATORY_CARE_PROVIDER_SITE_OTHER): Payer: Medicare HMO | Admitting: Family Medicine

## 2018-05-20 VITALS — BP 118/64 | HR 52 | Temp 97.7°F | Wt 193.2 lb

## 2018-05-20 DIAGNOSIS — J3089 Other allergic rhinitis: Secondary | ICD-10-CM | POA: Diagnosis not present

## 2018-05-20 NOTE — Progress Notes (Signed)
   Subjective:    Patient ID: Eric Richmond, male    DOB: Jul 06, 1940, 78 y.o.   MRN: [id number]  HPI Here for intermittent mild ST for the past 2 months. He has sinus congestion and some PND. Chloraseptic helps. No fever. No cough or SOB.    Review of Systems  Constitutional: Negative.   HENT: Positive for congestion, postnasal drip and sore throat. Negative for trouble swallowing.   Eyes: Negative.   Respiratory: Negative.   Gastrointestinal: Negative.        Objective:   Physical Exam Constitutional:      Appearance: Normal appearance. He is well-developed.  HENT:     Right Ear: Tympanic membrane and ear canal normal.     Left Ear: Tympanic membrane and ear canal normal.     Nose: Nose normal.     Mouth/Throat:     Pharynx: Oropharynx is clear. No posterior oropharyngeal erythema.     Comments: White mucus drainage is seen coming down the back of the nasopharynx  Eyes:     Conjunctiva/sclera: Conjunctivae normal.  Pulmonary:     Effort: Pulmonary effort is normal.     Breath sounds: Normal breath sounds.  Lymphadenopathy:     Cervical: No cervical adenopathy.  Neurological:     Mental Status: He is alert.           Assessment & Plan:  He has PND from allergies. Try Claritin daily.  Alysia Penna, MD

## 2018-06-02 ENCOUNTER — Telehealth: Payer: Self-pay | Admitting: Neurology

## 2018-06-02 NOTE — Telephone Encounter (Signed)
Wife is calling in again about medication.

## 2018-06-02 NOTE — Telephone Encounter (Signed)
Patient has not been doing well on the Donepezil medication. His spouse called regarding the past 3 nights he has been up seeing things and just having a bad reaction from it. Please Call. Thanks

## 2018-06-04 NOTE — Telephone Encounter (Signed)
Spoke with pt's wife, Dorathy Daft.  She states that pt seemed to do well on half tab Donepezil.  However once increased to full tab he started hallucinating, was waking up all hours of the night and appeared to have trouble urinating ([name] Lelon Frohlich states that she over heard him in the restroom attempting to urinate, but she never heard him urinate)  [name] Lelon Frohlich states that she has not given pt Donepezil x2 days now.  pls advise.

## 2018-06-04 NOTE — Telephone Encounter (Signed)
Spoke with Dorathy Daft, relaying message below.  She verbalized understanding. I advised her to contact our office with update Monday or Tuesday

## 2018-06-04 NOTE — Telephone Encounter (Signed)
Would not restart medication, see how he does over the weekend, if hallucinations and urination issues do not improve or worsen, may need urinalysis to make sure he is not having a UTI causing his symptoms.

## 2018-06-23 ENCOUNTER — Ambulatory Visit (INDEPENDENT_AMBULATORY_CARE_PROVIDER_SITE_OTHER): Payer: Medicare HMO | Admitting: Family Medicine

## 2018-06-23 ENCOUNTER — Other Ambulatory Visit: Payer: Self-pay

## 2018-06-23 ENCOUNTER — Encounter: Payer: Self-pay | Admitting: Family Medicine

## 2018-06-23 ENCOUNTER — Telehealth: Payer: Self-pay | Admitting: Neurology

## 2018-06-23 DIAGNOSIS — R4189 Other symptoms and signs involving cognitive functions and awareness: Secondary | ICD-10-CM

## 2018-06-23 MED ORDER — MEMANTINE HCL 10 MG PO TABS: 10.0000 mg | ORAL_TABLET | Freq: Two times a day (BID) | ORAL | 2 refills | Status: DC

## 2018-06-23 MED ORDER — DONEPEZIL HCL 10 MG PO TABS: 5.0000 mg | ORAL_TABLET | Freq: Every day | ORAL | 1 refills | Status: DC

## 2018-06-23 NOTE — Telephone Encounter (Signed)
Spoke with pt and his wife he stated that he doesn't really have trouble voiding (at times he does) and no pain or burning with voiding. Pt wife also asking if he should start back taken 1/2 tab of donepezil because that was working for him? Pt also advised to call PCP to inform them of what was going on. to see if they want to check his urine to see if may be early start of UTI. Pt wife stated that she would call PCP to inform them as well, pt also informed not to start any medications back until they hear back from me to tell them if you say that  It is ok for him to start back or not ,

## 2018-06-23 NOTE — Telephone Encounter (Signed)
Wife is calling in about patient's medication and wanting to give an update on things that are going on with him. Please call her back. Thanks!

## 2018-06-23 NOTE — Telephone Encounter (Signed)
The Donepezil should be out of his system at this point since he stopped it 3 weeks ago. Is he still waking up all hours of the night with trouble urinating? Sometimes having an infection such as a UTI can cause worsening confusion.

## 2018-06-23 NOTE — Telephone Encounter (Signed)
Spoke with pt wife he will restart taken 1/2 tab donepezil, pt has an appointment with his PCP today

## 2018-06-23 NOTE — Progress Notes (Signed)
Subjective:    Patient ID: Eric Richmond, male    DOB: [birth year], 78 y.o.   MRN: [id number]  HPI Virtual Visit via Video Note  I connected with the patient on 06/23/18 at  4:00 PM EDT by a video enabled telemedicine application and verified that I am speaking with the correct person using two identifiers.  Location patient: home Location provider:work or home office Persons participating in the virtual visit: patient, provider  I discussed the limitations of evaluation and management by telemedicine and the availability of in person appointments. The patient expressed understanding and agreed to proceed.   HPI: I spoke to him and his wife about his memory issues and his tendency to get emotional in the evenings. In the past few weeks he has had a number of apparent nightmares where he wakes up in the middle of the night very upset about something he thinks was happening or seeing people that were not there. He has seen Dr. Delice Lesch for possible dementia and he had tried Aricept. He took 5 mg bid for a few weeks and then increased this to 10 mg bid. He had trouble tolerating this, so at her advice he stopped it altogether 2 weeks ago. This was about the time his hallucinations surfaced.    ROS: See pertinent positives and negatives per HPI.  Past Medical History:  Diagnosis Date  . Allergy   . Anal fissure 20 years ago  . ED (erectile dysfunction)   . GERD (gastroesophageal reflux disease)   . Hard of hearing   . OSA (obstructive sleep apnea)    per Dr. Gwenette Greet , cpap -2.0  . Osteoarthritis    OA  RIGHT KNEE AND RIGHT THUMB;  S/P LEFT TOTAL KNEE REPLACEMENT  . Pneumonia    spring 2015 - HAS NOT USED OR NEEDED INHALER SINCE   . Sleep apnea    uses CPAP  . Unspecified essential hypertension     Past Surgical History:  Procedure Laterality Date  . ANAL FISSURE REPAIR    . colonoscopy  08/07/2016   per Dr. Hilarie Fredrickson, adenomatous polyps removed, repeat in 3 years   . KNEE  ARTHROSCOPY     left knee  . KNEE CLOSED REDUCTION Left 01/13/2014   Procedure: CLOSED MANIPULATION LEFT KNEE;  Surgeon: Gearlean Alf, MD;  Location: [facility];  Service: Orthopedics;  Laterality: Left;  . SURGERY AGE 18 OR 7 ON LEFT KNEE FOR A "GLAND BEHIND THE KNEE"    . TOTAL KNEE ARTHROPLASTY Left 10/26/2013   Procedure: LEFT TOTAL KNEE ARTHROPLASTY;  Surgeon: Gearlean Alf, MD;  Location: [facility];  Service: Orthopedics;  Laterality: Left;    Family History  Problem Relation Age of Onset  . Breast cancer Sister   . Diabetes Sister   . Hypertension Sister   . Thyroid cancer Sister   . Diabetes Mother   . Hypertension Mother   . Diabetes Father   . Hypertension Father   . Diabetes Sister   . Hypertension Unknown   . Diabetes Brother   . Hypertension Brother   . Heart failure Brother   . Heart failure Brother   . Diabetes Sister   . Colon cancer Neg Hx   . Esophageal cancer Neg Hx   . Rectal cancer Neg Hx   . Stomach cancer Neg Hx      Current Outpatient Medications:  .  albuterol (VENTOLIN HFA) 108 (90 Base) MCG/ACT inhaler, Inhale 2 puffs into the lungs every  6 (six) hours as needed for wheezing or shortness of breath., Disp: 1 Inhaler, Rfl: 11 .  aspirin EC 81 MG tablet, Take 81 mg by mouth daily., Disp: , Rfl:  .  atenolol (TENORMIN) 50 MG tablet, TAKE 1 TABLET EVERY DAY (NEED MD APPOINTMENT FOR REFILLS), Disp: 90 tablet, Rfl: 3 .  fluticasone (FLONASE) 50 MCG/ACT nasal spray, Place 2 sprays into both nostrils daily., Disp: 48 g, Rfl: 3 .  losartan-hydrochlorothiazide (HYZAAR) 100-25 MG tablet, Take 1 tablet by mouth every morning., Disp: 90 tablet, Rfl: 3 .  Multiple Vitamin (MULTIVITAMIN WITH MINERALS) TABS tablet, Take 1 tablet by mouth daily., Disp: , Rfl:  .  omeprazole (PRILOSEC) 40 MG capsule, TAKE 1 CAPSULE EVERY DAY, Disp: 90 capsule, Rfl: 3 .  memantine (NAMENDA) 10 MG tablet, Take 1 tablet (10 mg total) by mouth 2 (two) times daily., Disp: 60 tablet, Rfl: 2   EXAM:  VITALS per patient if applicable:  GENERAL: alert, oriented, appears well and in no acute distress  HEENT: atraumatic, conjunttiva clear, no obvious abnormalities on inspection of external nose and ears  NECK: normal movements of the head and neck  LUNGS: on inspection no signs of respiratory distress, breathing rate appears normal, no obvious gross SOB, gasping or wheezing  CV: no obvious cyanosis  MS: moves all visible extremities without noticeable abnormality  PSYCH/NEURO: pleasant and cooperative, no obvious depression or anxiety, speech and thought processing grossly intact  ASSESSMENT AND PLAN: Memory loss with some hallucinations. Try Namenda 10 mg bid. Recheck in 2-3 weeks.  Alysia Penna, MD  Discussed the following assessment and plan:  No diagnosis found.     I discussed the assessment and treatment plan with the patient. The patient was provided an opportunity to ask questions and all were answered. The patient agreed with the plan and demonstrated an understanding of the instructions.   The patient was advised to call back or seek an in-person evaluation if the symptoms worsen or if the condition fails to improve as anticipated.     Review of Systems     Objective:   Physical Exam        Assessment & Plan:

## 2018-06-23 NOTE — Telephone Encounter (Signed)
If she feels he was okay on the 1/2 tablet Donepezil daily, that is fine to restart at 1/2 tablet daily. Thanks

## 2018-06-23 NOTE — Telephone Encounter (Signed)
Spoke with pt wife she stated that pt has been off of his donepezil for 2 weeks, he has been seeing things, he fights in his sleep, pt stated that when he was taken a half of pill the medication worked well for him but when they increased it to a whole he started having a reaction to the medication, pt wife also stated the Pt had not had his MRI yet because of the Corona virus they are waiting for it to be rescheduled

## 2018-07-07 ENCOUNTER — Other Ambulatory Visit: Payer: Self-pay | Admitting: Family Medicine

## 2018-07-31 ENCOUNTER — Ambulatory Visit: Payer: Self-pay | Admitting: *Deleted

## 2018-07-31 NOTE — Telephone Encounter (Signed)
Pt reports lightheadedness x 1-1/2 weeks. States intermittent, worse with turning head at times. No spinning, states "Might last a few hours, not always." Also reports "Sinus trouble" but evasive as to symptoms. Denies tenderness. Wife present during call. States BP and HR  "Has been good." Cannot recall values. States is staying hydrated. Does not have access to computer. CAll transferred to practice for consideration of appt  Reason for Disposition . [1] MODERATE dizziness (e.g., interferes with normal activities) AND [2] has NOT been evaluated by physician for this  (Exception: dizziness caused by heat exposure, sudden standing, or poor fluid intake)  Answer Assessment - Initial Assessment Questions 1. DESCRIPTION: "Describe your dizziness."     lightheaded 2. LIGHTHEADED: "Do you feel lightheaded?" (e.g., somewhat faint, woozy, weak upon standing)     Yes,  3. VERTIGO: "Do you feel like either you or the room is spinning or tilting?" (i.e. vertigo)     no 4. SEVERITY: "How bad is it?"  "Do you feel like you are going to faint?" "Can you stand and walk?"   - MILD - walking normally   - MODERATE - interferes with normal activities (e.g., work, school)    - SEVERE - unable to stand, requires support to walk, feels like passing out now.      moderate 5. ONSET:  "When did the dizziness begin?"     1-1/2 weeks ago 6. AGGRAVATING FACTORS: "Does anything make it worse?" (e.g., standing, change in head position)     Turning head worsens 7. HEART RATE: "Can you tell me your heart rate?" "How many beats in 15 seconds?"  (Note: not all patients can do this)       no 8. CAUSE: "What do you think is causing the dizziness?"     unsure 9. RECURRENT SYMPTOM: "Have you had dizziness before?" If so, ask: "When was the last time?" "What happened that time?"     no 10. OTHER SYMPTOMS: "Do you have any other symptoms?" (e.g., fever, chest pain, vomiting, diarrhea, bleeding)       "I have sinus  trouble."  Protocols used: DIZZINESS [facility]

## 2018-07-31 NOTE — Telephone Encounter (Signed)
Appt scheduled with PCP for tomorrow.

## 2018-08-01 ENCOUNTER — Other Ambulatory Visit: Payer: Self-pay

## 2018-08-01 ENCOUNTER — Ambulatory Visit (INDEPENDENT_AMBULATORY_CARE_PROVIDER_SITE_OTHER): Payer: Medicare HMO | Admitting: Family Medicine

## 2018-08-01 ENCOUNTER — Encounter: Payer: Self-pay | Admitting: Family Medicine

## 2018-08-01 DIAGNOSIS — R42 Dizziness and giddiness: Secondary | ICD-10-CM | POA: Diagnosis not present

## 2018-08-01 NOTE — Progress Notes (Signed)
Subjective:    Patient ID: Eric Richmond, male    DOB: [birth year], 78 y.o.   MRN: [id number]  HPI Virtual Visit via Video Note  I connected with the patient on 08/01/18 at 11:15 AM EDT by a video enabled telemedicine application and verified that I am speaking with the correct person using two identifiers.  Location patient: home Location provider:work or home office Persons participating in the virtual visit: patient, provider  I discussed the limitations of evaluation and management by telemedicine and the availability of in person appointments. The patient expressed understanding and agreed to proceed.   HPI: Here for 3 weeks of mild intermittent dizziness. He says he feels that his balance is off. No lightheadedness or headaches. No vision changes. He has had some allergy issues with nasal and sinus congestion. No ST or cough or fever. His BP has been well controlled, in fact this morning it was 119/73 with a pulse of 63.    ROS: See pertinent positives and negatives per HPI.  Past Medical History:  Diagnosis Date  . Allergy   . Anal fissure 20 years ago  . ED (erectile dysfunction)   . GERD (gastroesophageal reflux disease)   . Hard of hearing   . OSA (obstructive sleep apnea)    per Dr. Gwenette Greet , cpap -2.0  . Osteoarthritis    OA  RIGHT KNEE AND RIGHT THUMB;  S/P LEFT TOTAL KNEE REPLACEMENT  . Pneumonia    spring 2015 - HAS NOT USED OR NEEDED INHALER SINCE   . Sleep apnea    uses CPAP  . Unspecified essential hypertension     Past Surgical History:  Procedure Laterality Date  . ANAL FISSURE REPAIR    . colonoscopy  08/07/2016   per Dr. Hilarie Fredrickson, adenomatous polyps removed, repeat in 3 years   . KNEE ARTHROSCOPY     left knee  . KNEE CLOSED REDUCTION Left 01/13/2014   Procedure: CLOSED MANIPULATION LEFT KNEE;  Surgeon: Gearlean Alf, MD;  Location: [facility];  Service: Orthopedics;  Laterality: Left;  . SURGERY AGE 68 OR 7 ON LEFT KNEE FOR A "GLAND BEHIND THE KNEE"     . TOTAL KNEE ARTHROPLASTY Left 10/26/2013   Procedure: LEFT TOTAL KNEE ARTHROPLASTY;  Surgeon: Gearlean Alf, MD;  Location: [facility];  Service: Orthopedics;  Laterality: Left;    Family History  Problem Relation Age of Onset  . Breast cancer Sister   . Diabetes Sister   . Hypertension Sister   . Thyroid cancer Sister   . Diabetes Mother   . Hypertension Mother   . Diabetes Father   . Hypertension Father   . Diabetes Sister   . Hypertension Unknown   . Diabetes Brother   . Hypertension Brother   . Heart failure Brother   . Heart failure Brother   . Diabetes Sister   . Colon cancer Neg Hx   . Esophageal cancer Neg Hx   . Rectal cancer Neg Hx   . Stomach cancer Neg Hx      Current Outpatient Medications:  .  albuterol (VENTOLIN HFA) 108 (90 Base) MCG/ACT inhaler, Inhale 2 puffs into the lungs every 6 (six) hours as needed for wheezing or shortness of breath., Disp: 1 Inhaler, Rfl: 11 .  aspirin EC 81 MG tablet, Take 81 mg by mouth daily., Disp: , Rfl:  .  atenolol (TENORMIN) 50 MG tablet, TAKE 1 TABLET EVERY DAY (NEED MD APPOINTMENT FOR REFILLS), Disp: 90 tablet, Rfl:  3 .  fluticasone (FLONASE) 50 MCG/ACT nasal spray, Place 2 sprays into both nostrils daily., Disp: 48 g, Rfl: 3 .  losartan-hydrochlorothiazide (HYZAAR) 100-25 MG tablet, Take 1 tablet by mouth every morning., Disp: 90 tablet, Rfl: 3 .  memantine (NAMENDA) 10 MG tablet, Take 1 tablet (10 mg total) by mouth 2 (two) times daily., Disp: 60 tablet, Rfl: 2 .  Multiple Vitamin (MULTIVITAMIN WITH MINERALS) TABS tablet, Take 1 tablet by mouth daily., Disp: , Rfl:  .  omeprazole (PRILOSEC) 40 MG capsule, TAKE 1 CAPSULE EVERY DAY, Disp: 90 capsule, Rfl: 3  EXAM:  VITALS per patient if applicable:  GENERAL: alert, oriented, appears well and in no acute distress  HEENT: atraumatic, conjunttiva clear, no obvious abnormalities on inspection of external nose and ears  NECK: normal movements of the head and neck  LUNGS:  on inspection no signs of respiratory distress, breathing rate appears normal, no obvious gross SOB, gasping or wheezing  CV: no obvious cyanosis  MS: moves all visible extremities without noticeable abnormality  PSYCH/NEURO: pleasant and cooperative, no obvious depression or anxiety, speech and thought processing grossly intact  ASSESSMENT AND PLAN: He is having some vertigo, and this is 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 He will start taking Claritin daily and add Mucinex BID as needed. Drink plenty of fluids. Recheck prn.  Alysia Penna, MD  Discussed the following assessment and plan:  No diagnosis found.     I discussed the assessment and treatment plan with the patient. The patient was provided an opportunity to ask questions and all were answered. The patient agreed with the plan and demonstrated an understanding of the instructions.   The patient was advised to call back or seek an in-person evaluation if the symptoms worsen or if the condition fails to improve as anticipated.     Review of Systems     Objective:   Physical Exam        Assessment & Plan:

## 2018-08-18 ENCOUNTER — Ambulatory Visit: Payer: Medicare HMO

## 2018-08-18 DIAGNOSIS — G4733 Obstructive sleep apnea (adult) (pediatric): Secondary | ICD-10-CM | POA: Diagnosis not present

## 2018-09-19 ENCOUNTER — Ambulatory Visit: Payer: Medicare HMO

## 2018-10-01 ENCOUNTER — Other Ambulatory Visit: Payer: Self-pay | Admitting: Family Medicine

## 2018-10-13 ENCOUNTER — Telehealth: Payer: Self-pay | Admitting: Neurology

## 2018-10-13 NOTE — Telephone Encounter (Signed)
Spoke with wife Eric Richmond and she is ready for him to get the MRI brain that was ordered in Feb of this year (they did not want to do due to Covid until now). Order for MRI brain without contrast still in Epic and per note referral made to [facility] on [address]. (Called and they do not have referral but told me to fax a new one and they will call patient to schedule).   In April telephone notes with this office state patient was not tolerating a full Donepezil tablet (10mg ) and was taking 1/2 tab. Wife stated today that was too much so he stopped that medication and his PCP [name] started him on a med "not as strong" - per chart Namenda 10 mg 1 tab BID ordered.  Patient still having headaches every am and some afternoons (doesn't take any medication for these - they go away on own). Wife also states patient complains of being dizzy a lot (PCP virtual visit for this 08/01/18).  Informed wife I would make sure the MRI brain without contrast is still the imaging MD wants and then will send referral to [facility].

## 2018-10-13 NOTE — Telephone Encounter (Signed)
Wife is calling in wanting him to get a scan for his memory. She said he was supposed to do a CT Scan or MRI back the beginning of the year and he never got it done because of COVID. She said he is having headaches in the morning every day and is needing this done. Thanks!

## 2018-10-13 NOTE — Telephone Encounter (Addendum)
Sent fax to [facility] ([facility]) at [phone] for open MRI brain without contrast.   Called patient's wife and informed her that Dr. Delice Lesch does want this MRI done and that I have sent in the referral. Informed wife if they don't hear from [facility] within the next week to schedule to call us back. Verbalized understanding.   (Update fax confirmation received 8/19)

## 2018-10-13 NOTE — Telephone Encounter (Signed)
Yes pls proceed with MRI brain, if sent to [facility] it was likely because he needs an open MRI. Thanks

## 2018-10-21 DIAGNOSIS — H0288A Meibomian gland dysfunction right eye, upper and lower eyelids: Secondary | ICD-10-CM | POA: Diagnosis not present

## 2018-10-21 DIAGNOSIS — H0288B Meibomian gland dysfunction left eye, upper and lower eyelids: Secondary | ICD-10-CM | POA: Diagnosis not present

## 2018-10-21 DIAGNOSIS — H25813 Combined forms of age-related cataract, bilateral: Secondary | ICD-10-CM | POA: Diagnosis not present

## 2018-10-21 DIAGNOSIS — H40023 Open angle with borderline findings, high risk, bilateral: Secondary | ICD-10-CM | POA: Diagnosis not present

## 2018-10-24 DIAGNOSIS — F0390 Unspecified dementia without behavioral disturbance: Secondary | ICD-10-CM | POA: Diagnosis not present

## 2018-10-29 ENCOUNTER — Telehealth: Payer: Self-pay | Admitting: Neurology

## 2018-10-29 NOTE — Telephone Encounter (Signed)
Eric Richmond called and left a voice mail requesting a call back with recent MRI results

## 2018-10-31 DIAGNOSIS — H25812 Combined forms of age-related cataract, left eye: Secondary | ICD-10-CM | POA: Diagnosis not present

## 2018-10-31 NOTE — Telephone Encounter (Signed)
Discussed MRI results with wife. MRI brain without contrast done 10/24/18 at [place] did not show any acute changes. There was mild generalized parenchymal volume loss, no significant white matter disease. He is tolerating the Donepezil without side effects, continue medication.

## 2018-11-08 ENCOUNTER — Other Ambulatory Visit: Payer: Self-pay | Admitting: Family Medicine

## 2018-11-11 NOTE — Telephone Encounter (Signed)
Spouse checking on the status of memantine (NAMENDA) 10 MG tablet refill request, informed please allow 48 to 72 hour turn around time, spouse states patient is completely out, please advise    [place], [place] - [place] [phone] (Phone) [phone] (Fax)

## 2018-11-14 ENCOUNTER — Ambulatory Visit: Payer: Medicare HMO

## 2018-11-17 ENCOUNTER — Ambulatory Visit: Payer: Medicare HMO

## 2018-12-02 ENCOUNTER — Other Ambulatory Visit: Payer: Self-pay

## 2018-12-02 ENCOUNTER — Ambulatory Visit (INDEPENDENT_AMBULATORY_CARE_PROVIDER_SITE_OTHER): Payer: Medicare HMO | Admitting: Neurology

## 2018-12-02 ENCOUNTER — Encounter: Payer: Self-pay | Admitting: Neurology

## 2018-12-02 VITALS — BP 147/85 | HR 65 | Ht 68.0 in | Wt 187.0 lb

## 2018-12-02 DIAGNOSIS — F0390 Unspecified dementia without behavioral disturbance: Secondary | ICD-10-CM

## 2018-12-02 DIAGNOSIS — F03A0 Unspecified dementia, mild, without behavioral disturbance, psychotic disturbance, mood disturbance, and anxiety: Secondary | ICD-10-CM

## 2018-12-02 MED ORDER — MEMANTINE HCL 10 MG PO TABS: 10.0000 mg | ORAL_TABLET | Freq: Two times a day (BID) | ORAL | 3 refills | Status: DC

## 2018-12-02 NOTE — Patient Instructions (Signed)
1. Schedule Neurocognitive testing  2. Continue Memantine 10mg  twice a day  3. Follow-up in 6 months, call for any changes  FALL PRECAUTIONS: Be cautious when walking. Scan the area for obstacles that may increase the risk of trips and falls. When getting up in the mornings, sit up at the edge of the bed for a few minutes before getting out of bed. Consider elevating the bed at the head end to avoid drop of blood pressure when getting up. Walk always in a well-lit room (use night lights in the walls). Avoid area rugs or power cords from appliances in the middle of the walkways. Use a walker or a cane if necessary and consider physical therapy for balance exercise. Get your eyesight checked regularly.  HOME SAFETY: Consider the safety of the kitchen when operating appliances like stoves, microwave oven, and blender. Consider having supervision and share cooking responsibilities until no longer able to participate in those. Accidents with firearms and other hazards in the house should be identified and addressed as well.  DRIVING: Regarding driving, in patients with progressive memory problems, driving will be impaired. We advise to have someone else do the driving if trouble finding directions or if minor accidents are reported. Independent driving assessment is available to determine safety of driving.  ABILITY TO BE LEFT ALONE: If patient is unable to contact 911 operator, consider using LifeLine, or when the need is there, arrange for someone to stay with patients. Smoking is a fire hazard, consider supervision or cessation. Risk of wandering should be assessed by caregiver and if detected at any point, supervision and safe proof recommendations should be instituted.  MEDICATION SUPERVISION: Inability to self-administer medication needs to be constantly addressed. Implement a mechanism to ensure safe administration of the medications.  RECOMMENDATIONS FOR ALL PATIENTS WITH MEMORY PROBLEMS: 1.  Continue to exercise (Recommend 30 minutes of walking everyday, or 3 hours every week) 2. Increase social interactions - continue going to [place] and enjoy social gatherings with friends and family 3. Eat healthy, avoid fried foods and eat more fruits and vegetables 4. Maintain adequate blood pressure, blood sugar, and blood cholesterol level. Reducing the risk of stroke and cardiovascular disease also helps promoting better memory. 5. Avoid stressful situations. Live a simple life and avoid aggravations. Organize your time and prepare for the next day in anticipation. 6. Sleep well, avoid any interruptions of sleep and avoid any distractions in the bedroom that may interfere with adequate sleep quality 7. Avoid sugar, avoid sweets as there is a strong link between excessive sugar intake, diabetes, and cognitive impairment The Mediterranean diet has been shown to help patients reduce the risk of progressive memory disorders and reduces cardiovascular risk. This includes eating fish, eat fruits and green leafy vegetables, nuts like almonds and hazelnuts, walnuts, and also use olive oil. Avoid fast foods and fried foods as much as possible. Avoid sweets and sugar as sugar use has been linked to worsening of memory function.  There is always a concern of gradual progression of memory problems. If this is the case, then we may need to adjust level of care according to patient needs. Support, both to the patient and caregiver, should then be put into place.

## 2018-12-02 NOTE — Progress Notes (Signed)
NEUROLOGY FOLLOW UP OFFICE NOTE  DETRAVIOUS KIRCHEN [id number] September 18, 1940  HISTORY OF PRESENT ILLNESS: I had the pleasure of seeing Angelia Mordhorst in follow-up in the neurology clinic on 12/02/2018.  The patient was last seen 8 months ago for memory loss. He is again accompanied by his wife who helps supplement the history today. Denning score 11/30 in February 2020, they denied any difficulty with complex tasks at that time. Records and images were personally reviewed where available.  MRI brain without contrast done 09/2018 did not show any acute changes, there was note of mild diffuse atrophy, no significant microvascular disease. TSH and B12 normal. They report that Donepezil was "too strong," at one point he was hallucinating and having trouble voiding, these have resolved. He was started on Memantine 10mg  BID by [name] which he is tolerating better without side effects. He states he has some problems with remembering. His wife reports he repeats himself over and over. He continues to drive to work in [place] and denies getting lost. His wife denies any driving concerns, and states he does not like to drive as much now. He manages his own medications, they both report he is pretty good with this. His wife manages finances. He misplaces things at home. He is independent with dressing and bathing. 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 She states he does not want to travel as much, he states this is because he has responsibilities for the church that he cannot leave behind. No paranoia or hallucinations.  He denies any headaches, vision changes, focal numbness/tingling/weakness, tremors, no falls. He feels dizzy when getting up in the morning, he feels wobbly when standing up. His BP has been normal. He has hearing loss and will be getting his hearing aids tomorrow. Sleep is good, his wife reports symptoms suggestive of REM behavior  disorder, sometimes he fights and kicks in his sleep.   History on Initial Assessment 04/25/2018: This is a very pleasant 78 year old right-handed man with a history of hypertension, sleep apnea on CPAP, presenting for evaluation of worsening memory. He states his memory is not as good as it had been. His wife started noticing memory changes around 6 months ago, noting that he is very forgetful. He forgets where he puts things or does not remember the meal he ate prior. She feels his forgetfulness is just more than usual. He occasionally repeats himself. They both agree that he is good with managing his own medications, he rarely forgets them. 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 He denies any difficulties with driving and work. He got lost briefly a week ago in an unfamiliar road, he got "a little excited" but was able to find his way back within 5 minutes. His wife denies any other driving concerns. Wife manages finances. He is independent with dressing and bathing. His wife has noticed he is a little more stressed out and "gets really tight" when he cannot find something. No paranoia or hallucinations. Sleep is good with CPAP, no wandering behavior.  He has frontal headaches when using a machine with smoke coming out. He has occasional dizziness when standing. He has occasional back pain from a pinched nerve. He has had a decreased sense of smell for some time. No diplopia, dysarthria/dysphagia, neck pain, focal numbness/tingling/weakness, bowel/bladder dysfunction, tremors. His father had memory issues. He occasionally drinks  a little wine. No significant head injuries.   PAST MEDICAL HISTORY: Past Medical History:  Diagnosis Date   Allergy    Anal fissure 20 years ago   ED (erectile dysfunction)    GERD (gastroesophageal reflux disease)    Hard of hearing    OSA (obstructive sleep apnea)    per Dr. 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 1 Inhaler 11   aspirin EC 81 MG tablet Take 81 mg by mouth daily.     atenolol (TENORMIN) 50 MG tablet TAKE 1 TABLET EVERY DAY (NEED MD APPOINTMENT FOR REFILLS) 90 tablet 3   fluticasone (FLONASE) 50 MCG/ACT nasal spray Place 2 sprays into both nostrils daily. 48 g 3   losartan-hydrochlorothiazide (HYZAAR) 100-25 MG tablet Take 1 tablet by mouth every morning. 90 tablet 3   memantine (NAMENDA) 10 MG tablet Take 1 tablet by mouth twice daily 60 tablet 0   Multiple Vitamin (MULTIVITAMIN WITH MINERALS) TABS tablet Take 1 tablet by mouth daily.     omeprazole (PRILOSEC) 40 MG capsule TAKE 1 CAPSULE EVERY DAY 90 capsule 3   No current facility-administered medications on file prior to visit.     ALLERGIES: Allergies  Allergen Reactions   Oxycodone     Hallucinations    Tramadol     Hallucinations     FAMILY HISTORY: Family History  Problem Relation Age of Onset   Breast cancer Sister    Diabetes Sister    Hypertension Sister    Thyroid cancer Sister    Diabetes Mother    Hypertension Mother    Diabetes Father    Hypertension Father    Diabetes Sister    Hypertension Unknown    Diabetes Brother    Hypertension Brother    Heart failure Brother    Heart failure Brother    Diabetes Sister    Colon cancer Neg Hx    Esophageal cancer Neg Hx    Rectal cancer Neg Hx    Stomach cancer Neg Hx     SOCIAL HISTORY: Social History   Socioeconomic History   Marital status: Married      Spouse name: Not on file   Number of children: Not on file   Years of education: Not on file   Highest education level: Not on file  Occupational History   Occupation: Environmental health practitioner strain: Not on file   Food insecurity    Worry: Not on file    Inability: Not on file   Transportation needs    Medical: Not on file    Non-medical: Not on file  Tobacco Use   Smoking status: Former Smoker    Packs/day: 0.30    Years: 20.00    Pack years: 6.00    Types: Cigarettes    Quit date: 02/26/1977    Years since quitting: 41.7   Smokeless tobacco: Current User    Types: Loss adjuster, chartered  Tobacco comment: Aged out of AAA   Substance and Sexual Activity   Alcohol use: No    Alcohol/week: 0.0 standard drinks   Drug use: No   Sexual activity: Not on file  Lifestyle   Physical activity    Days per week: Not on file    Minutes per session: Not on file   Stress: Not on file  Relationships   Social connections    Talks on phone: Not on file    Gets together: Not on file    Attends religious service: Not on file    Active member of club or organization: Not on file    Attends meetings of clubs or organizations: Not on file    Relationship status: Not on file   Intimate partner violence    Fear of current or ex partner: Not on file    Emotionally abused: Not on file    Physically abused: Not on file    Forced sexual activity: Not on file  Other Topics Concern   Not on file  Social History Narrative   Epworth Sleepiness Scale = 5 (12/14/2014)    REVIEW OF SYSTEMS: Constitutional: No fevers, chills, or sweats, no generalized fatigue, change in appetite Eyes: No visual changes, double vision, eye pain Ear, nose and throat: No hearing loss, ear pain, nasal congestion, sore throat Cardiovascular: No chest pain, palpitations Respiratory:  No shortness of breath at rest or with exertion, wheezes GastrointestinaI: No nausea, vomiting, diarrhea,  abdominal pain, fecal incontinence Genitourinary:  No dysuria, urinary retention or frequency Musculoskeletal:  No neck pain, back pain Integumentary: No rash, pruritus, skin lesions Neurological: as above Psychiatric: No depression, insomnia, anxiety Endocrine: No palpitations, fatigue, diaphoresis, mood swings, change in appetite, change in weight, increased thirst Hematologic/Lymphatic:  No anemia, purpura, petechiae. Allergic/Immunologic: no itchy/runny eyes, nasal congestion, recent allergic reactions, rashes  PHYSICAL EXAM: Vitals:   12/02/18 1137  BP: (!) 147/85  Pulse: 65  SpO2: 98%   General: No acute distress Head:  Normocephalic/atraumatic Skin/Extremities: No rash, no edema Neurological Exam: alert and oriented to person, place, and month. States year is 12. No aphasia or dysarthria. Fund of knowledge is appropriate.  Recent and remote memory are impaired.  Attention and concentration are reduced. Able to name objects and repeat phrases.  Montreal Cognitive Assessment  12/02/2018 04/25/2018  Visuospatial/ Executive (0/5) 1 1  Naming (0/3) 2 3  Attention: Read list of digits (0/2) 1 1  Attention: Read list of letters (0/1) 1 1  Attention: Serial 7 subtraction starting at 100 (0/3) 2 1  Language: Repeat phrase (0/2) 1 1  Language : Fluency (0/1) 0 0  Abstraction (0/2) 0 0  Delayed Recall (0/5) 0 0  Orientation (0/6) 4 2  Total 12 10  Adjusted Score (based on education) 13 11   Cranial nerves: Pupils equal, round, reactive to light. Extraocular movements intact with no nystagmus. Visual fields full. Facial sensation intact. No facial asymmetry. Tongue, uvula, palate midline.  Motor: Bulk and tone normal, muscle strength 5/5 throughout with no pronator drift. No cogwheeling. Finger to nose testing intact.  Gait narrow-based and steady, able to tandem walk adequately.  Romberg negative. No tremor, good finger and foot taps, no postural instability.   IMPRESSION: This is  a pleasant 78 yo RH man with a history of hypertension, sleep apnea on CPAP, with memory loss. 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 MRI brain no acute changes, no significant microvascular disease, mild diffuse atrophy. He will be scheduled for Neurocognitive testing to further evaluate memory concerns, as well as measures reflective of driving abilities. He had side effects on Donepezil and is now on Memantine 10mg  BID, refills sent. His wife is also reporting symptoms suggestive of REM behavior disorder, no other parkinsonian signs on exam today. All of their questions today were answered, his wife notes increased irritability, continue to monitor, may benefit from an SSRI in the future. Continue to monitor driving. Follow-up in 6 months, they know to call for any changes  Thank you for allowing me to participate in his care.  Please do not hesitate to call for any questions or concerns.  The duration of this appointment visit was 30 minutes of face-to-face time with the patient.  Greater than 50% of this time was spent in counseling, explanation of diagnosis, planning of further management, and coordination of care.   Ellouise Newer, M.D.   CC: [name]

## 2018-12-10 DIAGNOSIS — H2511 Age-related nuclear cataract, right eye: Secondary | ICD-10-CM | POA: Diagnosis not present

## 2018-12-12 ENCOUNTER — Other Ambulatory Visit: Payer: Self-pay

## 2018-12-12 ENCOUNTER — Telehealth (INDEPENDENT_AMBULATORY_CARE_PROVIDER_SITE_OTHER): Payer: Medicare HMO | Admitting: Family Medicine

## 2018-12-12 ENCOUNTER — Ambulatory Visit: Payer: Self-pay

## 2018-12-12 DIAGNOSIS — H25811 Combined forms of age-related cataract, right eye: Secondary | ICD-10-CM | POA: Diagnosis not present

## 2018-12-12 DIAGNOSIS — R350 Frequency of micturition: Secondary | ICD-10-CM | POA: Diagnosis not present

## 2018-12-12 DIAGNOSIS — F0390 Unspecified dementia without behavioral disturbance: Secondary | ICD-10-CM | POA: Diagnosis not present

## 2018-12-12 DIAGNOSIS — I10 Essential (primary) hypertension: Secondary | ICD-10-CM | POA: Diagnosis not present

## 2018-12-12 DIAGNOSIS — F03A0 Unspecified dementia, mild, without behavioral disturbance, psychotic disturbance, mood disturbance, and anxiety: Secondary | ICD-10-CM

## 2018-12-12 NOTE — Telephone Encounter (Signed)
  Pt.'s wife calling to report pt. Had cataract surgery this morning. Reports "they told me his blood pressure was high and to check it at home." BP was 205/102. When she rechecked it at 12:30 it was 180/102. Pt. Is not having symptoms. Warm transfer to [place] in the practice for a visit. Answer Assessment - Initial Assessment Questions 1. BLOOD PRESSURE: "What is the blood pressure?" "Did you take at least two measurements 5 minutes apart?"     180/102 2. ONSET: "When did you take your blood pressure?"     12:30 3. HOW: "How did you obtain the blood pressure?" (e.g., visiting nurse, automatic home BP monitor)     Home monitor 4. HISTORY: "Do you have a history of high blood pressure?"     Yes 5. MEDICATIONS: "Are you taking any medications for blood pressure?" "Have you missed any doses recently?"     No missed doses 6. OTHER SYMPTOMS: "Do you have any symptoms?" (e.g., headache, chest pain, blurred vision, difficulty breathing, weakness)     Some dizziness 7. PREGNANCY: "Is there any chance you are pregnant?" "When was your last menstrual period?"     n/a  Protocols used: HIGH BLOOD PRESSURE-A-AH

## 2018-12-12 NOTE — Progress Notes (Signed)
Virtual Visit via Telephone Note  I connected with Eric Richmond on 12/12/18 at  2:30 PM EDT by telephone and verified that I am speaking with the correct person using two identifiers.   I discussed the limitations, risks, security and privacy concerns of performing an evaluation and management service by telephone and the availability of in person appointments. I also discussed with the patient that there may be a patient responsible charge related to this service. The patient expressed understanding and agreed to proceed.  Location patient: home Location provider: work or home office Participants present for the call: patient, provider, pt's wife Patient did not have a visit in the prior 7 days to address this/these issue(s).   History of Present Illness: Pt is 78 yo male with pmh sig , HTN, dementia, GERD, OSA, DM, OA, h/o nicotine use followed by [name].  Pt seen for acute concern, but several chronic issues mentioned.  Pt had R cataract removal today.  After the procedure the nurse told his wife that his bp was elevated (did not say how high).  At home bp 205/102 this am, then 180/102 at lunch.  Pt denies dizziness, HAs, CP.  Pt taking Atenolol 50 mg and losartan-HCTZ 100-25 mg.   Endorses compliance, but was out of meds x 1-2 wks last month.  No changes in diet per wife.  Increased stress several months ago related to "an issue at church".  Pt does not think he was anxious this am, had L cataract removed a few wks ago.  Pt plans to walk for exercise.    Pt quitting smoking cigarettes yrs ago.  Currently chews tobacco, last use ~1 wk ago.  Per pt's wife pt frequently complains of sore throat.  Per pt's wife, pt with urinary frequency.  Going every 10 mins, started today.  Pt denies dysuria, fever, chills, increased confusion.    Observations/Objective: Patient sounds cheerful and well on the phone. I do not appreciate any SOB. Speech and thought processing are grossly  intact. Patient reported vitals:  BP 158/96  P 53  Assessment and Plan: Essential hypertension -elevated but improving.  Recheck during visit 158/96 -discussed various possible causes of elevation.   -Hesitant to adjust meds or add med.  Needs BMP.  Pulse will not tolerate increase in BB. -Pt encouraged to increase po intake of water and fluids. -discussed lifestyle modifications.  Pt encouraged to start walking again with his wife. 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 Delice Lesch  Urinary frequency -possibly 2/2 BPH, uti, uncontrolled DM -Obtain UA during f/u [facility] on Monday. -given precautions   Follow Up Instructions: F/u in 3 days with PCP to recheck bp and for other chronic concerns.  I did not refer this patient for an OV in the next 24 hours for this/these issue(s).  I discussed the assessment and treatment plan with the patient. The patient was provided an opportunity to ask questions and all were answered. The patient agreed with the plan and demonstrated an understanding of the instructions.   The patient was advised to call back or seek an in-person evaluation if the symptoms worsen or if the condition fails to improve as anticipated.  I provided 22 minutes of non-face-to-face time during this encounter.   Billie Ruddy, MD

## 2018-12-12 NOTE — Telephone Encounter (Signed)
Please advise 

## 2018-12-14 NOTE — Progress Notes (Signed)
Chief Complaint  Patient presents with   Hypertension    pt states that he gets dizziness an that his blood pressure has been "up and down"    HPI: Eric Richmond 78 y.o. come in for problem with bp and dizziness .   pcp NA   televisitdr Banks last week  For very high bp on day of cataract surgery. Here with spouse   alsi   Very high at home   After surgery  Friday  And yesterday not as high  But then back to normal 118 range   This am was normal  1117/78  Forgot to bring the record   He has been having intermittent dizziness for a while since JUne?  If daily vs off and on not lasting  . Uncertain but may be positional and uncertain if related to taking his med . Is not on Namenda   Has to watch balance .  But no falling   Has hearing aids but not wearing today  ROS: See pertinent positives and negatives per HPI. No cp sob syncope or weaikness episodes  Says he has  Elevated bg  Borderline dm.  No current  Urinary frequency but had some a few days ago   Past Medical History:  Diagnosis Date   Allergy    Anal fissure 20 years ago   ED (erectile dysfunction)    GERD (gastroesophageal reflux disease)    Hard of hearing    OSA (obstructive sleep apnea)    per Dr. Gwenette Greet , cpap -2.0   Osteoarthritis    OA  RIGHT KNEE AND RIGHT THUMB;  S/P LEFT TOTAL KNEE REPLACEMENT   Pneumonia    spring 2015 - HAS NOT USED OR NEEDED INHALER SINCE    Sleep apnea    uses CPAP   Unspecified essential hypertension     Family History  Problem Relation Age of Onset   Breast cancer Sister    Diabetes Sister    Hypertension Sister    Thyroid cancer Sister    Diabetes Mother    Hypertension Mother    Diabetes Father    Hypertension Father    Diabetes Sister    Hypertension Other    Diabetes Brother    Hypertension Brother    Heart failure Brother    Heart failure Brother    Diabetes Sister    Colon cancer Neg Hx    Esophageal cancer Neg Hx    Rectal cancer  Neg Hx    Stomach cancer Neg Hx     Social History   Socioeconomic History   Marital status: Married    Spouse name: Not on file   Number of children: Not on file   Years of education: Not on file   Highest education level: Not on file  Occupational History   Occupation: Environmental health practitioner strain: Not on file   Food insecurity    Worry: Not on file    Inability: Not on file   Transportation needs    Medical: Not on file    Non-medical: Not on file  Tobacco Use   Smoking status: Former Smoker    Packs/day: 0.30    Years: 20.00    Pack years: 6.00    Types: Cigarettes    Quit date: 02/26/1977    Years since quitting: 41.8   Smokeless tobacco: Current User    Types: Chew   Tobacco comment: Aged out of [employer]  Substance and Sexual Activity   Alcohol use: No    Alcohol/week: 0.0 standard drinks   Drug use: No   Sexual activity: Yes    Partners: Female  Lifestyle   Physical activity    Days per week: Not on file    Minutes per session: Not on file   Stress: Not on file  Relationships   Social connections    Talks on phone: Not on file    Gets together: Not on file    Attends religious service: Not on file    Active member of club or organization: Not on file    Attends meetings of clubs or organizations: Not on file    Relationship status: Not on file  Other Topics Concern   Not on file  Social History Narrative   Epworth Sleepiness Scale = 5 (12/14/2014)      Right handed      Graduated HS      Lives with wife    Outpatient Medications Prior to Visit  Medication Sig Dispense Refill   albuterol (VENTOLIN HFA) 108 (90 Base) MCG/ACT inhaler Inhale 2 puffs into the lungs every 6 (six) hours as needed for wheezing or shortness of breath. 1 Inhaler 11   aspirin EC 81 MG tablet Take 81 mg by mouth daily.     atenolol (TENORMIN) 50 MG tablet TAKE 1 TABLET EVERY DAY (NEED MD APPOINTMENT FOR REFILLS) 90 tablet 3    fluticasone (FLONASE) 50 MCG/ACT nasal spray Place 2 sprays into both nostrils daily. 48 g 3   losartan-hydrochlorothiazide (HYZAAR) 100-25 MG tablet Take 1 tablet by mouth every morning. 90 tablet 3   memantine (NAMENDA) 10 MG tablet Take 1 tablet (10 mg total) by mouth 2 (two) times daily. 180 tablet 3   Multiple Vitamin (MULTIVITAMIN WITH MINERALS) TABS tablet Take 1 tablet by mouth daily.     omeprazole (PRILOSEC) 40 MG capsule TAKE 1 CAPSULE EVERY DAY 90 capsule 3   No facility-administered medications prior to visit.      EXAM:  BP 128/80 (BP Location: Right Arm, Patient Position: Sitting, Cuff Size: Large)    Pulse 60    Temp 98 F (36.7 C) (Temporal)    Wt 181 lb 9.6 oz (82.4 kg)    SpO2 98%    BMI 27.61 kg/m   Body mass index is 27.61 kg/m.  GENERAL: vitals reviewed and listed above, alert, oriented, appears well hydrated and in no acute distress HEENT: atraumatic, conjunctiva  clear, no obvious abnormalities on inspection of external nose and ears   NECK: no obvious masses on inspection palpation  LUNGS: clear to auscultation bilaterally, no wheezes, rales or rhonchi, good air movement CV: HRRR, no clubbing cyanosis or  peripheral edema nl cap refill  Pulse is 50 ang regular  Orthostatic bp     No sig drop  1 abd  No masses  Or bnriuits  MS: moves all extremities without noticeable focal  abnormality PSYCH: pleasant and cooperative, no obvious depression or anxiety gait seems steady  Non focal exam  Lab Results  Component Value Date   WBC 5.7 02/07/2018   HGB 14.4 02/07/2018   HCT 42.3 02/07/2018   PLT 171.0 02/07/2018   GLUCOSE 102 (H) 02/07/2018   CHOL 135 10/21/2017   TRIG 54.0 10/21/2017   HDL 51.80 10/21/2017   LDLCALC 72 10/21/2017   ALT 12 02/07/2018   AST 19 02/07/2018   NA 139 02/07/2018   K 4.3 02/07/2018  CL 103 02/07/2018   CREATININE 0.97 02/07/2018   BUN 17 02/07/2018   CO2 27 02/07/2018   TSH 1.04 04/25/2018   PSA 1.56 10/21/2017   INR  1.01 10/19/2013   HGBA1C 6.4 02/07/2018   BP Readings from Last 3 Encounters:  12/15/18 128/80  12/02/18 (!) 147/85  05/20/18 118/64   Wt Readings from Last 3 Encounters:  12/15/18 181 lb 9.6 oz (82.4 kg)  12/02/18 187 lb (84.8 kg)  05/20/18 193 lb 4 oz (87.7 kg)     ASSESSMENT AND PLAN:  Discussed the following assessment and plan:  Essential hypertension - Plan: POCT Urinalysis Dipstick (Automated), CBC with Differential/Platelet, Hemoglobin A1c, TSH, CMP  Medication management - Plan: POCT Urinalysis Dipstick (Automated), CBC with Differential/Platelet, Hemoglobin A1c, TSH, CMP  Mild dementia (HCC) - Plan: POCT Urinalysis Dipstick (Automated), CBC with Differential/Platelet, Hemoglobin A1c, TSH, CMP  Dizziness - Plan: POCT Urinalysis Dipstick (Automated), CBC with Differential/Platelet, Hemoglobin A1c, TSH, CMP  Need for immunization against influenza - Plan: Flu Vaccine QUAD High Dose(Fluad)  Uncertain cause of dizziness but ,may be orthostatic  Pulse is just  50 and may be a factor   Lab monitoring and get more readings and pulse when having sx especially .    Considering  Split the dose to 25 bid or dec dose of atenolol.    Await readings and  Lab    And more information.   If correlate with meds  timing -Patient advised to return or notify health care team  if  new concerns arise.  Patient Instructions  Your exam is good and bp is good today . 120/80 and 138/70 range   Your pulse is about 50  The atenolol can   Lower pulse .  And sometimes can cause dizziness  Calendar the dizziness and see what bp and pulse is at that time.   Labs today  To check sugar and kidneys and  Blood count .  Consider decreasing the  Atenolol to 25 mg per day   And monitor pulse  and bp readings and send in  To [place] in  2 weeks and then go from there   Plan chek with dr Sarajane Jews in  About 3 weeks or earlier if problems .       Standley Brooking. Calais Svehla M.D.

## 2018-12-15 ENCOUNTER — Encounter: Payer: Self-pay | Admitting: Internal Medicine

## 2018-12-15 ENCOUNTER — Other Ambulatory Visit: Payer: Self-pay

## 2018-12-15 ENCOUNTER — Ambulatory Visit: Payer: Medicare HMO | Admitting: Family Medicine

## 2018-12-15 ENCOUNTER — Ambulatory Visit (INDEPENDENT_AMBULATORY_CARE_PROVIDER_SITE_OTHER): Payer: Medicare HMO | Admitting: Internal Medicine

## 2018-12-15 VITALS — BP 128/80 | HR 60 | Temp 98.0°F | Wt 181.6 lb

## 2018-12-15 DIAGNOSIS — R42 Dizziness and giddiness: Secondary | ICD-10-CM

## 2018-12-15 DIAGNOSIS — F0390 Unspecified dementia without behavioral disturbance: Secondary | ICD-10-CM

## 2018-12-15 DIAGNOSIS — F03A0 Unspecified dementia, mild, without behavioral disturbance, psychotic disturbance, mood disturbance, and anxiety: Secondary | ICD-10-CM

## 2018-12-15 DIAGNOSIS — Z23 Encounter for immunization: Secondary | ICD-10-CM | POA: Diagnosis not present

## 2018-12-15 DIAGNOSIS — I10 Essential (primary) hypertension: Secondary | ICD-10-CM | POA: Diagnosis not present

## 2018-12-15 DIAGNOSIS — Z79899 Other long term (current) drug therapy: Secondary | ICD-10-CM

## 2018-12-15 LAB — POC URINALSYSI DIPSTICK (AUTOMATED)
Glucose, UA: NEGATIVE
Leukocytes, UA: NEGATIVE
Protein, UA: NEGATIVE
Spec Grav, UA: 1.015 (ref 1.010–1.025)
Urobilinogen, UA: 0.2 E.U./dL
pH, UA: 6 (ref 5.0–8.0)

## 2018-12-15 NOTE — Patient Instructions (Signed)
Your exam is good and bp is good today . 120/80 and 138/70 range   Your pulse is about 50  The atenolol can   Lower pulse .  And sometimes can cause dizziness  Calendar the dizziness and see what bp and pulse is at that time.   Labs today  To check sugar and kidneys and  Blood count .  Consider decreasing the  Atenolol to 25 mg per day   And monitor pulse  and bp readings and send in  To [place] in  2 weeks and then go from there   Plan chek with dr Sarajane Jews in  About 3 weeks or earlier if problems .

## 2018-12-15 NOTE — Telephone Encounter (Signed)
This has been taking care of.

## 2018-12-16 LAB — TSH: TSH: 1.16 u[IU]/mL (ref 0.35–4.50)

## 2018-12-16 LAB — COMPREHENSIVE METABOLIC PANEL
ALT: 12 U/L (ref 0–53)
AST: 19 U/L (ref 0–37)
Albumin: 4.2 g/dL (ref 3.5–5.2)
Alkaline Phosphatase: 72 U/L (ref 39–117)
BUN: 30 mg/dL — ABNORMAL HIGH (ref 6–23)
CO2: 29 mEq/L (ref 19–32)
Calcium: 9.5 mg/dL (ref 8.4–10.5)
Chloride: 101 mEq/L (ref 96–112)
Creatinine, Ser: 1.5 mg/dL (ref 0.40–1.50)
GFR: 54.7 mL/min — ABNORMAL LOW (ref >60.00)
Glucose, Bld: 95 mg/dL (ref 70–99)
Potassium: 4.3 mEq/L (ref 3.5–5.1)
Sodium: 136 mEq/L (ref 135–145)
Total Bilirubin: 0.9 mg/dL (ref 0.2–1.2)
Total Protein: 6.6 g/dL (ref 6.0–8.3)

## 2018-12-16 LAB — CBC WITH DIFFERENTIAL/PLATELET
Basophils Absolute: 0.1 10*3/uL (ref 0.0–0.1)
Basophils Relative: 0.8 % (ref 0.0–3.0)
Eosinophils Absolute: 0.2 10*3/uL (ref 0.0–0.7)
Eosinophils Relative: 2.5 % (ref 0.0–5.0)
HCT: 41.7 % (ref 39.0–52.0)
Hemoglobin: 14.1 g/dL (ref 13.0–17.0)
Lymphocytes Relative: 24.9 % (ref 12.0–46.0)
Lymphs Abs: 2.2 10*3/uL (ref 0.7–4.0)
MCHC: 33.7 g/dL (ref 30.0–36.0)
MCV: 89.3 fl (ref 78.0–100.0)
Monocytes Absolute: 0.5 10*3/uL (ref 0.1–1.0)
Monocytes Relative: 5.8 % (ref 3.0–12.0)
Neutro Abs: 5.8 10*3/uL (ref 1.4–7.7)
Neutrophils Relative %: 66 % (ref 43.0–77.0)
Platelets: 178 10*3/uL (ref 150.0–400.0)
RBC: 4.68 Mil/uL (ref 4.22–5.81)
RDW: 14.1 % (ref 11.5–15.5)
WBC: 8.9 10*3/uL (ref 4.0–10.5)

## 2018-12-16 LAB — HEMOGLOBIN A1C: Hgb A1c MFr Bld: 6.2 % (ref 4.6–6.5)

## 2018-12-18 ENCOUNTER — Other Ambulatory Visit: Payer: Self-pay | Admitting: Family Medicine

## 2018-12-25 ENCOUNTER — Ambulatory Visit: Payer: Self-pay | Admitting: *Deleted

## 2018-12-25 ENCOUNTER — Ambulatory Visit: Payer: Self-pay

## 2018-12-25 ENCOUNTER — Encounter: Payer: Self-pay | Admitting: *Deleted

## 2018-12-25 NOTE — Telephone Encounter (Signed)
Called and left message for patient to call back.

## 2018-12-25 NOTE — Telephone Encounter (Signed)
[name] returned call and reported the following BP's that were taken in the morning before pt had BP medication 123/72 10/21-Monday of last week 152/87, p-52 10/22 153/87 Monday, 10/26 127/75, p-50,Tuesday 12/23/18 123/72-Wed, 12/24/18 152/87 Thurs, 12/25/18

## 2018-12-25 NOTE — Telephone Encounter (Signed)
Message from Luciana Axe sent at 12/25/2018 5:31 PM EDT  BP recorded at 163/94 +62 at 5:04 PM, wife is requesting a call back from Nurse. Pt wants to go for a walk and wife wants to know if that is safe  [phone]    Patient's wife calling to see if it was ok for the patient to walk with BP  Of 163/94. No symptoms currently but pt states he does experience dizziness that comes and goes. [name] states she was not at home so she was not able to give BP reading from the past couple of days. [name] states that the pt's BP has been higher than usual for the past 2 weeks. [name] advised that the patient should walk but he should stop if he begins to experience symptoms such as dizziness. [name] advised to return call for worsening symptoms. [name] will return call once she is home with the most recent readings.  Reason for Disposition . Systolic BP  >= 0000000 OR Diastolic >= [id number]  Answer Assessment - Initial Assessment Questions 1. BLOOD PRESSURE: "What is the blood pressure?" "Did you take at least two measurements 5 minutes apart?"      2. ONSET: "When did you take your blood pressure?"      3. HOW: "How did you obtain the blood pressure?" (e.g., visiting nurse, automatic home BP monitor)      4. HISTORY: "Do you have a history of high blood pressure?"      5. MEDICATIONS: "Are you taking any medications for blood pressure?" "Have you missed any doses recently?"     Yes and has not missed any doses recently 6. OTHER SYMPTOMS: "Do you have any symptoms?" (e.g., headache, chest pain, blurred vision, difficulty breathing, weakness)     Dizziness comes and goes 7. PREGNANCY: "Is there any chance you are pregnant?" "When was your last menstrual period?"     n/a  Protocols used: HIGH BLOOD PRESSURE-A-AH

## 2018-12-25 NOTE — Telephone Encounter (Signed)
This encounter was created in error - please disregard.

## 2018-12-26 ENCOUNTER — Telehealth: Payer: Self-pay

## 2018-12-26 NOTE — Telephone Encounter (Signed)
These BP readings are okay. Let us know if the systolic readings are consistently above 150

## 2018-12-26 NOTE — Telephone Encounter (Signed)
Spoke to pt spouse and she was advised of update.

## 2018-12-26 NOTE — Telephone Encounter (Signed)
Copied from [place] [phone]. Topic: Quick Communication - See Telephone Encounter >> Dec 26, 2018  1:15 PM Blase Mess A wrote: CRM for notification. See Telephone encounter for: 12/26/18. Patient's wife is call calling Tillie Rung back. Please advise  [phone]

## 2018-12-26 NOTE — Telephone Encounter (Signed)
Called pt no answer °

## 2018-12-29 MED ORDER — LOSARTAN POTASSIUM-HCTZ 100-25 MG PO TABS: 1.0000 | ORAL_TABLET | Freq: Every morning | ORAL | 0 refills | Status: DC

## 2018-12-29 NOTE — Telephone Encounter (Signed)
Copied from [place] [phone]. Topic: General - Other >> Dec 29, 2018  2:43 PM Pauline Good wrote: Reason for CRM: pt's  losartan-hydrochlorothiazide (HYZAAR) 100-25 MG tablet is on delay from [facility] order and want to know if 15 pills can be sent to his local [place]

## 2019-01-01 ENCOUNTER — Telehealth: Payer: Self-pay | Admitting: Family Medicine

## 2019-01-01 NOTE — Telephone Encounter (Signed)
[name] called to follow up on pt's blood pressure. Stated it is high in the mornings and he has dizziness. She would like to know what the next step is to get it under control. Please advise. Requesting CB. Latest reading today was 139/87 Pulse 49

## 2019-01-02 ENCOUNTER — Ambulatory Visit: Payer: Self-pay | Admitting: *Deleted

## 2019-01-02 NOTE — Telephone Encounter (Signed)
Continue his current meds but we will add Amlodipine 5 mg every day at bedtime, call in #30 with 5 rf. Report back on how he is doing in 3 weeks

## 2019-01-02 NOTE — Telephone Encounter (Signed)
Call pt mailbox was full. Will have to call Monday. Crm created

## 2019-01-02 NOTE — Telephone Encounter (Signed)
Spoke to Hurshel Keys and she stated pt has been having some high readings in the a.m.  Wed 187/102 P 52   Th 171/105 P 50   This am 165/99 P 53    Pt was advised to call back per last phone note 12/26/2018 if pt Bp is consistently 150 or higher.

## 2019-01-02 NOTE — Telephone Encounter (Signed)
[name] called regarding her husband's b/p being low today. It was up this morning at  165/99 HR 53 and down this afternoon to 102/55 and 89/60. He has not started on any new medication. She stated that he has not been sick.  He is also c/o dizziness when he is up, it comes and goes. She thinks it is not drinking enough water. But has urinated about 4 times today. His b/p was elevated yesterday and had sent a message to her provider. He prescribed a new b/p medication per chart but she has not gotten a notice of this medication. She is advised for him to increase his fluids and per protocol to go to the ED for assessment. She voiced understanding. Routing to the LB at [place] for review.  oReason for Disposition . [id number] Fall in systolic BP > 20 mm Hg from normal AND [2] dizzy, lightheaded, or weak  Answer Assessment - Initial Assessment Questions 1. BLOOD PRESSURE: "What is the blood pressure?" "Did you take at least two measurements 5 minutes apart?"     102/55 HR 49 and  89/60 HR 60 2. ONSET: "When did you take your blood pressure?"     At 3:15 today 3. HOW: "How did you obtain the blood pressure?" (e.g., visiting nurse, automatic home BP monitor)     Automatic home bp med 4. HISTORY: "Do you have a history of low blood pressure?" "What is your blood pressure normally?"     no  5. MEDICATIONS: "Are you taking any medications for blood pressure?" If yes: "Have they been changed recently?"    Yes taking medication 6. PULSE RATE: "Do you know what your pulse rate is?"      60 7. OTHER SYMPTOMS: "Have you been sick recently?" "Have you had a recent injury?"     no 8. PREGNANCY: "Is there any chance you are pregnant?" "When was your last menstrual period?"     n/a  Protocols used: LOW BLOOD PRESSURE-A-AH

## 2019-01-02 NOTE — Telephone Encounter (Signed)
Will monitor for ED arrival.  

## 2019-01-03 NOTE — Telephone Encounter (Signed)
No ED arrival. Please call and check on patient

## 2019-01-05 ENCOUNTER — Encounter: Payer: Self-pay | Admitting: Psychology

## 2019-01-05 ENCOUNTER — Other Ambulatory Visit: Payer: Self-pay

## 2019-01-05 ENCOUNTER — Ambulatory Visit: Payer: Medicare HMO | Admitting: Psychology

## 2019-01-05 DIAGNOSIS — G3184 Mild cognitive impairment, so stated: Secondary | ICD-10-CM | POA: Diagnosis not present

## 2019-01-05 DIAGNOSIS — F03A0 Unspecified dementia, mild, without behavioral disturbance, psychotic disturbance, mood disturbance, and anxiety: Secondary | ICD-10-CM

## 2019-01-05 DIAGNOSIS — F0390 Unspecified dementia without behavioral disturbance: Secondary | ICD-10-CM

## 2019-01-05 MED ORDER — AMLODIPINE BESYLATE 5 MG PO TABS: 5.0000 mg | ORAL_TABLET | Freq: Every day | ORAL | 2 refills | Status: DC

## 2019-01-05 NOTE — Telephone Encounter (Signed)
Pt wife notified of uodate

## 2019-01-05 NOTE — Progress Notes (Signed)
   Neuropsychology Note   Eric Richmond completed 110 minutes of neuropsychological testing with technician, Milana Kidney, B.S., under the supervision of Dr. Christia Reading, Ph.D., licensed neuropsychologist. The patient did not appear overtly distressed by the testing session, per behavioral observation or via self-report to the technician. Rest breaks were offered.    In considering the patient's current level of functioning, level of presumed impairment, nature of symptoms, emotional and behavioral responses during the interview, level of literacy, and observed level of motivation/effort, a battery of tests was selected and communicated to the psychometrician.   Communication between the psychologist and technician was ongoing throughout the testing session and changes were made as deemed necessary based on patient performance on testing, technician observations and additional pertinent factors such as those listed above.   Eric Richmond will return within approximately two weeks for an interactive feedback session with Dr. Melvyn Novas at which time his test performances, clinical impressions, and treatment recommendations will be reviewed in detail. The patient understands he can contact our office should he require our assistance before this time.   Full report to follow.  110 minutes were spent face-to-face with patient administering standardized tests and 15 minutes were spent scoring (technician). [CPT [id number], [id number]

## 2019-01-05 NOTE — Telephone Encounter (Signed)
Called pt for update but pt phone keeps disconnecting.
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
## 2019-01-06 ENCOUNTER — Encounter: Payer: Self-pay | Admitting: Psychology

## 2019-01-06 DIAGNOSIS — G3184 Mild cognitive impairment, so stated: Secondary | ICD-10-CM

## 2019-01-06 HISTORY — DX: Mild cognitive impairment of uncertain or unknown etiology: G31.84

## 2019-01-07 NOTE — Telephone Encounter (Signed)
Copied from [place] [id number]. Topic: General - Inquiry >> Jan 02, 2019  5:29 PM Eric Richmond wrote: Reason for CRM: Patient returned call from office . Please advise

## 2019-01-07 NOTE — Telephone Encounter (Signed)
This has been taking care of.

## 2019-01-14 ENCOUNTER — Encounter: Payer: Self-pay | Admitting: Psychology

## 2019-01-14 ENCOUNTER — Ambulatory Visit (INDEPENDENT_AMBULATORY_CARE_PROVIDER_SITE_OTHER): Payer: Medicare HMO | Admitting: Psychology

## 2019-01-14 ENCOUNTER — Other Ambulatory Visit: Payer: Self-pay

## 2019-01-14 DIAGNOSIS — G3184 Mild cognitive impairment, so stated: Secondary | ICD-10-CM

## 2019-01-14 NOTE — Progress Notes (Signed)
   Neuropsychology Feedback Session Eric Richmond. [facility]  Reason for Referral:   Eric Richmond a 78 y.o. African-American male referred by Ellouise Newer, M.D.,to characterize hiscurrent cognitive functioning and assist with diagnostic clarity and treatment planning in the context of subjective cognitive decline.  Feedback:   Eric Richmond completed a comprehensive neuropsychological evaluation on 01/05/2019. Please refer to that encounter for the full report. Briefly, results suggested prominent impairments surrounding executive functioning, verbal learning and memory, and confrontation naming. Additional performance variability was exhibited across processing speed, attention/concentration, receptive language, and visuospatial/constructional abilities. The etiology of Eric Richmond deficits is somewhat unclear. Amnestic performance across 2 verbal memory measures, coupled with very poor performance across confrontation naming and executive functioning tasks do raise concern for Alzheimer's disease. Despite this, performance across a visual memory measure was average relative to age-matched peers, despite low raw scores, which is inconsistent with what is typically seen. Overall, he meets criteria for a mild neurocognitive disorder (formerly "mild cognitive impairment"); however, he is likely towards the moderate to severe end of this spectrum currently.   Eric Richmond was accompanied by his wife. Content of the current session focused on the results of the current evaluation. Eric Richmond and his wife were given the opportunity to ask questions and their questions were answered. They were also encouraged to reach out should additional questions arise. A copy of his report was provided at the conclusion of the visit.      A total of 20 minutes were spent with Eric Richmond and his wife during the current feedback session.

## 2019-01-17 NOTE — Progress Notes (Deleted)
Cardiology Office Note:    Date:  01/17/2019   ID:  IZEAH BREUNINGER, DOB 11-24-1940, MRN [id number]  PCP:  [name], MD  Cardiologist:  No primary care provider on file.   Referring MD: [name], MD   No chief complaint on file.   History of Present Illness:    Eric Richmond is a 78 y.o. male with a hx of hypertension, DM II, ED, OSA, mild cognitive impairment, and prior negative ischemic w/un by Dr. Oval Linsey 2016. Hear today at request of his wife.  ***  Past Medical History:  Diagnosis Date  . Allergy   . Anal fissure 20 years ago  . Diabetes mellitus without complication ([place]) 0000000  . ED (erectile dysfunction)   . Essential hypertension 10/22/2006   Qualifier: Diagnosis of  By: Sarajane Jews MD, Ishmael Holter   . GERD (gastroesophageal reflux disease)   . Hard of hearing   . 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 OSA (obstructive sleep apnea)    per Dr. Gwenette Greet , cpap -2.0  . Osteoarthritis    Right knee and right thumb; s/p total knee replacement  . Pneumonia 2015    Past Surgical History:  Procedure Laterality Date  . ANAL FISSURE REPAIR    . colonoscopy  08/07/2016   per Dr. Hilarie Fredrickson, adenomatous polyps removed, repeat in 3 years   . KNEE ARTHROSCOPY     left knee  . KNEE CLOSED REDUCTION Left 01/13/2014   Procedure: CLOSED MANIPULATION LEFT KNEE;  Surgeon: Gearlean Alf, MD;  Location: [facility];  Service: Orthopedics;  Laterality: Left;  . SURGERY AGE 42 OR 7 ON LEFT KNEE FOR A "GLAND BEHIND THE KNEE"    . TOTAL KNEE ARTHROPLASTY Left 10/26/2013   Procedure: LEFT TOTAL KNEE ARTHROPLASTY;  Surgeon: Gearlean Alf, MD;  Location: [facility];  Service: Orthopedics;  Laterality: Left;    Current Medications: No outpatient medications have been marked as taking for the 01/21/19 encounter (Appointment) with Belva Crome, MD.     Allergies:   Oxycodone and Tramadol   Social History   Socioeconomic History  . Marital status: Married    Spouse name: Not  on file  . Number of children: Not on file  . Years of education: 21  . Highest education level: High school graduate  Occupational History  . Occupation: Restaurant manager, fast food  . Financial resource strain: Not on file  . Food insecurity    Worry: Not on file    Inability: Not on file  . Transportation needs    Medical: Not on file    Non-medical: Not on file  Tobacco Use  . Smoking status: Former Smoker    Packs/day: 0.30    Years: 20.00    Pack years: 6.00    Types: Cigarettes    Quit date: 02/26/1977    Years since quitting: 41.9  . Smokeless tobacco: Former Systems developer    Types: Chew  Substance and Sexual Activity  . Alcohol use: No    Alcohol/week: 0.0 standard drinks  . Drug use: No  . Sexual activity: Yes    Partners: Female  Lifestyle  . Physical activity    Days per week: Not on file    Minutes per session: Not on file  . Stress: Not on file  Relationships  . Social Herbalist on phone: Not on file    Gets together: Not on file    Attends religious service: Not  on file    Active member of club or organization: Not on file    Attends meetings of clubs or organizations: Not on file    Relationship status: Not on file  Other Topics Concern  . Not on file  Social History Narrative   Epworth Sleepiness Scale = 5 (12/14/2014)      Right handed      Graduated HS      Lives with wife     Family History: The patient's family history includes Breast cancer in his sister; Diabetes in his brother, father, mother, sister, sister, and sister; Heart failure in his brother and brother; Hypertension in his brother, father, mother, sister, and another family member; Thyroid cancer in his sister. There is no history of Colon cancer, Esophageal cancer, Rectal cancer, or Stomach cancer.  ROS:   Please see the history of present illness.    *** All other systems reviewed and are negative.  EKGs/Labs/Other Studies Reviewed:    The following studies were reviewed  today: ***  EKG:  EKG ***  Recent Labs: 12/15/2018: ALT 12; BUN 30; Creatinine, Ser 1.50; Hemoglobin 14.1; Platelets 178.0; Potassium 4.3; Sodium 136; TSH 1.16  Recent Lipid Panel    Component Value Date/Time   CHOL 135 10/21/2017 0922   TRIG 54.0 10/21/2017 0922   HDL 51.80 10/21/2017 0922   CHOLHDL 3 10/21/2017 0922   VLDL 10.8 10/21/2017 0922   LDLCALC 72 10/21/2017 0922    Physical Exam:    VS:  There were no vitals taken for this visit.    Wt Readings from Last 3 Encounters:  12/15/18 181 lb 9.6 oz (82.4 kg)  12/02/18 187 lb (84.8 kg)  05/20/18 193 lb 4 oz (87.7 kg)     GEN: ***. No acute distress HEENT: Normal NECK: No JVD. LYMPHATICS: No lymphadenopathy CARDIAC: *** RRR without murmur, gallop, or edema. VASCULAR: *** Normal Pulses. No bruits. RESPIRATORY:  Clear to auscultation without rales, wheezing or rhonchi  ABDOMEN: Soft, non-tender, non-distended, No pulsatile mass, MUSCULOSKELETAL: No deformity  SKIN: Warm and dry NEUROLOGIC:  Alert and oriented x 3 PSYCHIATRIC:  Normal affect   ASSESSMENT:    1. Essential hypertension   2. Diabetes mellitus without complication ([place])   3. OSA (obstructive sleep apnea)   4. Hyperlipidemia LDL goal <70   5. Educated about COVID-19 virus infection    PLAN:    In order of problems listed above:  1. ***   Medication Adjustments/Labs and Tests Ordered: Current medicines are reviewed at length with the patient today.  Concerns regarding medicines are outlined above.  No orders of the defined types were placed in this encounter.  No orders of the defined types were placed in this encounter.   There are no Patient Instructions on file for this visit.   Signed, Sinclair Grooms, MD  01/17/2019 7:30 PM    [place]

## 2019-01-19 DIAGNOSIS — H40023 Open angle with borderline findings, high risk, bilateral: Secondary | ICD-10-CM | POA: Diagnosis not present

## 2019-01-19 DIAGNOSIS — H52203 Unspecified astigmatism, bilateral: Secondary | ICD-10-CM | POA: Diagnosis not present

## 2019-01-19 DIAGNOSIS — H524 Presbyopia: Secondary | ICD-10-CM | POA: Diagnosis not present

## 2019-01-19 DIAGNOSIS — H5213 Myopia, bilateral: Secondary | ICD-10-CM | POA: Diagnosis not present

## 2019-01-21 ENCOUNTER — Ambulatory Visit: Payer: Medicare HMO | Admitting: Interventional Cardiology

## 2019-01-27 DIAGNOSIS — U071 COVID-19: Secondary | ICD-10-CM

## 2019-01-27 HISTORY — DX: COVID-19: U07.1

## 2019-02-09 DIAGNOSIS — Z20828 Contact with and (suspected) exposure to other viral communicable diseases: Secondary | ICD-10-CM | POA: Diagnosis not present

## 2019-02-10 ENCOUNTER — Emergency Department (HOSPITAL_COMMUNITY): Payer: Medicare HMO

## 2019-02-10 ENCOUNTER — Encounter: Payer: Self-pay | Admitting: Family Medicine

## 2019-02-10 ENCOUNTER — Other Ambulatory Visit: Payer: Self-pay

## 2019-02-10 ENCOUNTER — Telehealth: Payer: Self-pay

## 2019-02-10 ENCOUNTER — Telehealth (INDEPENDENT_AMBULATORY_CARE_PROVIDER_SITE_OTHER): Payer: Medicare HMO | Admitting: Family Medicine

## 2019-02-10 ENCOUNTER — Encounter (HOSPITAL_COMMUNITY): Payer: Self-pay

## 2019-02-10 ENCOUNTER — Emergency Department (HOSPITAL_COMMUNITY)
Admission: EM | Admit: 2019-02-10 | Discharge: 2019-02-10 | Disposition: A | Discharge status: Home / Self Care | Payer: Medicare HMO | Source: Home / Self Care | Attending: Emergency Medicine | Admitting: Emergency Medicine

## 2019-02-10 DIAGNOSIS — R400 Somnolence: Secondary | ICD-10-CM

## 2019-02-10 DIAGNOSIS — R52 Pain, unspecified: Secondary | ICD-10-CM | POA: Diagnosis not present

## 2019-02-10 DIAGNOSIS — R739 Hyperglycemia, unspecified: Secondary | ICD-10-CM | POA: Diagnosis not present

## 2019-02-10 DIAGNOSIS — J988 Other specified respiratory disorders: Secondary | ICD-10-CM | POA: Diagnosis not present

## 2019-02-10 DIAGNOSIS — Z96653 Presence of artificial knee joint, bilateral: Secondary | ICD-10-CM | POA: Diagnosis present

## 2019-02-10 DIAGNOSIS — N183 Chronic kidney disease, stage 3 unspecified: Secondary | ICD-10-CM | POA: Diagnosis not present

## 2019-02-10 DIAGNOSIS — G4733 Obstructive sleep apnea (adult) (pediatric): Secondary | ICD-10-CM | POA: Diagnosis present

## 2019-02-10 DIAGNOSIS — F0280 Dementia in other diseases classified elsewhere without behavioral disturbance: Secondary | ICD-10-CM | POA: Diagnosis not present

## 2019-02-10 DIAGNOSIS — D696 Thrombocytopenia, unspecified: Secondary | ICD-10-CM | POA: Diagnosis present

## 2019-02-10 DIAGNOSIS — E1165 Type 2 diabetes mellitus with hyperglycemia: Secondary | ICD-10-CM | POA: Diagnosis present

## 2019-02-10 DIAGNOSIS — U071 COVID-19: Secondary | ICD-10-CM | POA: Diagnosis present

## 2019-02-10 DIAGNOSIS — I10 Essential (primary) hypertension: Secondary | ICD-10-CM | POA: Diagnosis not present

## 2019-02-10 DIAGNOSIS — J9601 Acute respiratory failure with hypoxia: Secondary | ICD-10-CM | POA: Diagnosis present

## 2019-02-10 DIAGNOSIS — R05 Cough: Secondary | ICD-10-CM | POA: Diagnosis not present

## 2019-02-10 DIAGNOSIS — F0390 Unspecified dementia without behavioral disturbance: Secondary | ICD-10-CM | POA: Diagnosis present

## 2019-02-10 DIAGNOSIS — Z79899 Other long term (current) drug therapy: Secondary | ICD-10-CM | POA: Diagnosis not present

## 2019-02-10 DIAGNOSIS — G9340 Encephalopathy, unspecified: Secondary | ICD-10-CM | POA: Diagnosis not present

## 2019-02-10 DIAGNOSIS — R42 Dizziness and giddiness: Secondary | ICD-10-CM | POA: Diagnosis not present

## 2019-02-10 DIAGNOSIS — E871 Hypo-osmolality and hyponatremia: Secondary | ICD-10-CM | POA: Diagnosis present

## 2019-02-10 DIAGNOSIS — K219 Gastro-esophageal reflux disease without esophagitis: Secondary | ICD-10-CM | POA: Diagnosis present

## 2019-02-10 DIAGNOSIS — R509 Fever, unspecified: Secondary | ICD-10-CM | POA: Diagnosis not present

## 2019-02-10 DIAGNOSIS — R0902 Hypoxemia: Secondary | ICD-10-CM | POA: Diagnosis not present

## 2019-02-10 DIAGNOSIS — I129 Hypertensive chronic kidney disease with stage 1 through stage 4 chronic kidney disease, or unspecified chronic kidney disease: Secondary | ICD-10-CM | POA: Diagnosis present

## 2019-02-10 DIAGNOSIS — Z87891 Personal history of nicotine dependence: Secondary | ICD-10-CM | POA: Diagnosis not present

## 2019-02-10 DIAGNOSIS — Z7982 Long term (current) use of aspirin: Secondary | ICD-10-CM | POA: Diagnosis not present

## 2019-02-10 DIAGNOSIS — G9341 Metabolic encephalopathy: Secondary | ICD-10-CM | POA: Diagnosis present

## 2019-02-10 DIAGNOSIS — R410 Disorientation, unspecified: Secondary | ICD-10-CM | POA: Diagnosis present

## 2019-02-10 DIAGNOSIS — E1122 Type 2 diabetes mellitus with diabetic chronic kidney disease: Secondary | ICD-10-CM | POA: Diagnosis present

## 2019-02-10 DIAGNOSIS — N1831 Chronic kidney disease, stage 3a: Secondary | ICD-10-CM | POA: Diagnosis present

## 2019-02-10 DIAGNOSIS — T380X5A Adverse effect of glucocorticoids and synthetic analogues, initial encounter: Secondary | ICD-10-CM | POA: Diagnosis present

## 2019-02-10 DIAGNOSIS — Z9989 Dependence on other enabling machines and devices: Secondary | ICD-10-CM | POA: Diagnosis not present

## 2019-02-10 DIAGNOSIS — Z8249 Family history of ischemic heart disease and other diseases of the circulatory system: Secondary | ICD-10-CM | POA: Diagnosis not present

## 2019-02-10 DIAGNOSIS — N179 Acute kidney failure, unspecified: Secondary | ICD-10-CM | POA: Diagnosis present

## 2019-02-10 DIAGNOSIS — J1289 Other viral pneumonia: Secondary | ICD-10-CM | POA: Diagnosis present

## 2019-02-10 DIAGNOSIS — E119 Type 2 diabetes mellitus without complications: Secondary | ICD-10-CM | POA: Diagnosis not present

## 2019-02-10 DIAGNOSIS — Z209 Contact with and (suspected) exposure to unspecified communicable disease: Secondary | ICD-10-CM | POA: Diagnosis not present

## 2019-02-10 DIAGNOSIS — R531 Weakness: Secondary | ICD-10-CM | POA: Diagnosis not present

## 2019-02-10 DIAGNOSIS — Z833 Family history of diabetes mellitus: Secondary | ICD-10-CM | POA: Diagnosis not present

## 2019-02-10 DIAGNOSIS — Z20828 Contact with and (suspected) exposure to other viral communicable diseases: Secondary | ICD-10-CM | POA: Insufficient documentation

## 2019-02-10 DIAGNOSIS — R4182 Altered mental status, unspecified: Secondary | ICD-10-CM | POA: Diagnosis not present

## 2019-02-10 LAB — BASIC METABOLIC PANEL
Anion gap: 11 (ref 5–15)
BUN: 25 mg/dL — ABNORMAL HIGH (ref 8–23)
CO2: 25 mmol/L (ref 22–32)
Calcium: 8.5 mg/dL — ABNORMAL LOW (ref 8.9–10.3)
Chloride: 97 mmol/L — ABNORMAL LOW (ref 98–111)
Creatinine, Ser: 1.22 mg/dL (ref 0.61–1.24)
GFR calc Af Amer: >60 mL/min (ref >60)
GFR calc non Af Amer: 56 mL/min — ABNORMAL LOW (ref >60)
Glucose, Bld: 126 mg/dL — ABNORMAL HIGH (ref 70–99)
Potassium: 3.5 mmol/L (ref 3.5–5.1)
Sodium: 133 mmol/L — ABNORMAL LOW (ref 135–145)

## 2019-02-10 LAB — CBC
HCT: 37.6 % — ABNORMAL LOW (ref 39.0–52.0)
Hemoglobin: 12.9 g/dL — ABNORMAL LOW (ref 13.0–17.0)
MCH: 30.1 pg (ref 26.0–34.0)
MCHC: 34.3 g/dL (ref 30.0–36.0)
MCV: 87.6 fL (ref 80.0–100.0)
Platelets: 102 10*3/uL — ABNORMAL LOW (ref 150–400)
RBC: 4.29 MIL/uL (ref 4.22–5.81)
RDW: 12.8 % (ref 11.5–15.5)
WBC: 4.6 10*3/uL (ref 4.0–10.5)
nRBC: 0 % (ref 0.0–0.2)

## 2019-02-10 LAB — POC SARS CORONAVIRUS 2 AG -  ED: SARS Coronavirus 2 Ag: POSITIVE — AB

## 2019-02-10 NOTE — ED Provider Notes (Signed)
[facility] DEPT Provider Note   CSN: [id number] Arrival date & time: 02/10/19  1010     History Chief Complaint  Patient presents with  . Altered Mental Status    Eric Richmond is a 78 y.o. male.  HPI    Patient with dementia presents from home with family concerns of confusion, cough. Patient self denies complaints, denies discomfort, denies dyspnea, cannot provide additional details of his history, other than eating that his physician and his wife suggest that he be evaluated. Level 5 caveat secondary to dementia. Per nursing report and chart review, the patient had a telemedicine visit with his primary care physician today due to he and his wife with having a cough over the past few days They were both tested [facility] the [facility], and is patient's wife's test result was positive for coronavirus earlier today. No reported fever, fall, vomiting, diarrhea. Seemingly the patient has been more somnolent than usual, with less interactivity, possibly more confusion.  Past Medical History:  Diagnosis Date  . Allergy   . Anal fissure 20 years ago  . Diabetes mellitus without complication ([place]) 0000000  . ED (erectile dysfunction)   . Essential hypertension 10/22/2006   Qualifier: Diagnosis of  By: Sarajane Jews MD, Ishmael Holter   . GERD (gastroesophageal reflux disease)   . Hard of hearing   . 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 OSA (obstructive sleep apnea)    per Dr. Gwenette Greet , cpap -2.0  . Osteoarthritis    Right knee and right thumb; s/p total knee replacement  . Pneumonia 2015    Patient Active Problem List   Diagnosis Date Noted  . 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 Environmental and seasonal allergies 05/20/2018  . Postoperative stiffness of total knee replacement ([place]) 01/12/2014  . OA (osteoarthritis) of knee 10/26/2013  . Diabetes mellitus without complication ([place]) [id number]  . OSA (obstructive sleep apnea)  07/21/2010  . Essential hypertension 10/22/2006  . GERD (gastroesophageal reflux disease) 10/22/2006    Past Surgical History:  Procedure Laterality Date  . ANAL FISSURE REPAIR    . colonoscopy  08/07/2016   per Dr. Hilarie Fredrickson, adenomatous polyps removed, repeat in 3 years   . KNEE ARTHROSCOPY     left knee  . KNEE CLOSED REDUCTION Left 01/13/2014   Procedure: CLOSED MANIPULATION LEFT KNEE;  Surgeon: Gearlean Alf, MD;  Location: [facility];  Service: Orthopedics;  Laterality: Left;  . SURGERY AGE 68 OR 7 ON LEFT KNEE FOR A "GLAND BEHIND THE KNEE"    . TOTAL KNEE ARTHROPLASTY Left 10/26/2013   Procedure: LEFT TOTAL KNEE ARTHROPLASTY;  Surgeon: Gearlean Alf, MD;  Location: [facility];  Service: Orthopedics;  Laterality: Left;       Family History  Problem Relation Age of Onset  . Breast cancer Sister   . Diabetes Sister   . Hypertension Sister   . Thyroid cancer Sister   . Diabetes Mother   . Hypertension Mother   . Diabetes Father   . Hypertension Father   . Diabetes Sister   . Hypertension Other   . Diabetes Brother   . Hypertension Brother   . Heart failure Brother   . Heart failure Brother   . Diabetes Sister   . Colon cancer Neg Hx   . Esophageal cancer Neg Hx   . Rectal cancer Neg Hx   . Stomach cancer Neg Hx     Social History   Tobacco Use  . Smoking  status: Former Smoker    Packs/day: 0.30    Years: 20.00    Pack years: 6.00    Types: Cigarettes    Quit date: 02/26/1977    Years since quitting: 41.9  . Smokeless tobacco: Former Systems developer    Types: Chew  Substance Use Topics  . Alcohol use: No    Alcohol/week: 0.0 standard drinks  . Drug use: No    Home Medications Prior to Admission medications   Medication Sig Start Date End Date Taking? Authorizing Provider  albuterol (VENTOLIN HFA) 108 (90 Base) MCG/ACT inhaler Inhale 2 puffs into the lungs every 6 (six) hours as needed for wheezing or shortness of breath. 02/11/18  Yes [name], MD  amLODipine  (NORVASC) 5 MG tablet Take 1 tablet (5 mg total) by mouth daily. 01/05/19  Yes [name], MD  aspirin EC 81 MG tablet Take 81 mg by mouth daily.   Yes [provider]  atenolol (TENORMIN) 50 MG tablet TAKE 1 TABLET EVERY DAY (NEED MD APPOINTMENT FOR REFILLS) Patient taking differently: Take 50 mg by mouth daily.  07/09/18  Yes [name], MD  fluticasone (FLONASE) 50 MCG/ACT nasal spray Place 2 sprays into both nostrils daily. Patient taking differently: Place 2 sprays into both nostrils daily as needed for allergies.  10/21/17  Yes [name], MD  losartan-hydrochlorothiazide (HYZAAR) 100-25 MG tablet Take 1 tablet by mouth every morning. 12/29/18  Yes [name], MD  memantine (NAMENDA) 10 MG tablet Take 1 tablet (10 mg total) by mouth 2 (two) times daily. 12/02/18  Yes Cameron Sprang, MD  Multiple Vitamin (MULTIVITAMIN WITH MINERALS) TABS tablet Take 1 tablet by mouth daily.   Yes [provider]  omeprazole (PRILOSEC) 40 MG capsule TAKE 1 CAPSULE EVERY DAY Patient taking differently: Take 40 mg by mouth daily.  07/09/18  Yes [name], MD    Allergies    Oxycodone and Tramadol  Review of Systems   Review of Systems  Unable to perform ROS: Dementia    Physical Exam Updated Vital Signs BP 117/77   Pulse 67   Temp 99.1 F (37.3 C) (Oral)   Resp 19   SpO2 97%   Physical Exam Vitals and nursing note reviewed.  Constitutional:      General: He is not in acute distress.    Appearance: He is well-developed.  HENT:     Head: Normocephalic and atraumatic.  Eyes:     Conjunctiva/sclera: Conjunctivae normal.  Cardiovascular:     Rate and Rhythm: Normal rate and regular rhythm.  Pulmonary:     Effort: Pulmonary effort is normal. No respiratory distress.     Breath sounds: No stridor.  Abdominal:     General: There is no distension.  Skin:    General: Skin is warm and dry.  Neurological:     Mental Status: He is alert.     Cranial Nerves:  Cranial nerves are intact.     Motor: Atrophy present. No tremor or abnormal muscle tone.     Coordination: Coordination normal.  Psychiatric:        Cognition and Memory: Cognition is impaired.     ED Results / Procedures / Treatments   Labs (all labs ordered are listed, but only abnormal results are displayed) Labs Reviewed  CBC - Abnormal; Notable for the following components:      Result Value   Hemoglobin 12.9 (*)    HCT 37.6 (*)  Platelets 102 (*)    All other components within normal limits  BASIC METABOLIC PANEL - Abnormal; Notable for the following components:   Sodium 133 (*)    Chloride 97 (*)    Glucose, Bld 126 (*)    BUN 25 (*)    Calcium 8.5 (*)    GFR calc non Af Amer 56 (*)    All other components within normal limits  POC SARS CORONAVIRUS 2 AG -  ED - Abnormal; Notable for the following components:   SARS Coronavirus 2 Ag POSITIVE (*)    All other components within normal limits    EKG None  Radiology DG Chest Port 1 View  Result Date: 02/10/2019 CLINICAL DATA:  Cough EXAM: PORTABLE CHEST 1 VIEW COMPARISON:  11/07/2013 FINDINGS: The heart size and mediastinal contours are within normal limits. Low lung volumes. Both lungs are clear. The visualized skeletal structures are unremarkable. IMPRESSION: No active disease. Electronically Signed   By: Kathreen Devoid   On: 02/10/2019 10:58    Procedures Procedures (including critical care time)  Medications Ordered in ED Medications - No data to display  ED Course  I have reviewed the triage vital signs and the nursing notes.  Pertinent labs & imaging results that were available during my care of the patient were reviewed by me and considered in my medical decision making (see chart for details).    MDM Rules/Calculators/A&P                      12:16 PM Patient in no distress, alert, work with no hypoxia, no increased work of breathing, no new oxygen requirement.  This elderly male with prior  testing which is not available now presents with concern for possible confusion, though on exam he is awake, alert, in no distress, and seemingly appropriate with his history of dementia. Findings here are reassuring, aside from positive coronavirus test, consistent with suspicion. Absent distress, substantial lab abnormalities, hemodynamic instability, the patient is appropriate for outpatient management of his coronavirus infection.  Eric Richmond was evaluated in Emergency Department on 02/10/2019 for the symptoms described in the history of present illness. He was evaluated in the context of the global COVID-19 pandemic, which necessitated consideration that the patient might be at risk for infection with the SARS-CoV-2 virus that causes COVID-19. Institutional protocols and algorithms that pertain to the evaluation of patients at risk for COVID-19 are in a state of rapid change based on information released by regulatory bodies including the CDC and federal and state organizations. These policies and algorithms were followed during the patient's care in the ED.  Final Clinical Impression(s) / ED Diagnoses Final diagnoses:  COVID-19 virus infection      Carmin Muskrat, MD 02/10/19 1218

## 2019-02-10 NOTE — ED Notes (Signed)
Pts wife on the way to transport pt home. Pts wife informed to call ED when she arrives and the pt will be brought out to the vehicle

## 2019-02-10 NOTE — Progress Notes (Signed)
Virtual Visit via Telephone Note  I connected with the patient on 02/10/19 at  8:30 AM EST by telephone and verified that I am speaking with the correct person using two identifiers. We attempted to connect virtually but we had technical difficulties with the audio and video.     I discussed the limitations, risks, security and privacy concerns of performing an evaluation and management service by telephone and the availability of in person appointments. I also discussed with the patient that there may be a patient responsible charge related to this service. The patient expressed understanding and agreed to proceed.  Location patient: home Location provider: work or home office Participants present for the call: patient, provider Patient did not have a visit in the prior 7 days to address this/these issue(s).   History of Present Illness: Here with his wife to ask what he should do. Both  He and his wife have felt some fatigue and have had a dry cough for several days. His wife then lost her senses of taste and smell yesterday. [facility] of them [facility] yesterday to be tested for the Covid-19 virus. His wife's result came back this morning and she is positive for the virus. Rameen's result is still pending, but his wife is very concerned about his this morning. He is still coughing a bit but does not seem to be SOB. No fever. However he is extremely fatigued and does nothing but sit in the chair and sleep. When she does wake him up to talk to him, he seems very confused and is not making sense with his speech.    Observations/Objective: I could not speak with him.   Assessment and Plan: Weakness, cough, and confusion in an elderly man who likely has a Covid-19 infection. He needs to be evaluated at the ER. I instrructed his wife to call 911 so EMS can transport him to the ER. Obviously she cannot go to the hospital with him and must stay quarantined at home. She agreed.  Alysia Penna,  MD   Follow Up Instructions:     [phone] 21-30 I did not refer this patient for an OV in the next 24 hours for this/these issue(s).  I discussed the assessment and treatment plan with the patient. The patient was provided an opportunity to ask questions and all were answered. The patient agreed with the plan and demonstrated an understanding of the instructions.   The patient was advised to call back or seek an in-person evaluation if the symptoms worsen or if the condition fails to improve as anticipated.  I provided 16 minutes of non-face-to-face time during this encounter.   Alysia Penna, MD

## 2019-02-10 NOTE — Telephone Encounter (Signed)
Pt was fatigue per pt spouse. PT was scheduled for VV today.

## 2019-02-10 NOTE — ED Triage Notes (Signed)
Pt arrives [facility] from home. Per EMS: Pts wife reports that the pt has been more altered and lethargic. Pts wife tested positive for COVID recently. Pt tested for COVID yesterday. Result pending. Pt has hx of Dementia

## 2019-02-10 NOTE — Discharge Instructions (Signed)
As discussed, your evaluation today has been largely reassuring.  But, it is important that you monitor your condition carefully, and do not hesitate to return to the ED if you develop new, or concerning changes in your condition. ? ?Otherwise, please follow-up with your physician for appropriate ongoing care. ? ?

## 2019-02-12 ENCOUNTER — Emergency Department (HOSPITAL_COMMUNITY): Payer: Medicare HMO

## 2019-02-12 ENCOUNTER — Other Ambulatory Visit: Payer: Self-pay

## 2019-02-12 ENCOUNTER — Inpatient Hospital Stay (HOSPITAL_COMMUNITY)
Admission: EM | Admit: 2019-02-12 | Discharge: 2019-02-16 | DRG: 177 | Disposition: A | Discharge status: Home / Self Care | Payer: Medicare HMO | Attending: Internal Medicine | Admitting: Internal Medicine

## 2019-02-12 ENCOUNTER — Encounter (HOSPITAL_COMMUNITY): Payer: Self-pay

## 2019-02-12 DIAGNOSIS — J988 Other specified respiratory disorders: Secondary | ICD-10-CM | POA: Diagnosis present

## 2019-02-12 DIAGNOSIS — R410 Disorientation, unspecified: Secondary | ICD-10-CM | POA: Diagnosis present

## 2019-02-12 DIAGNOSIS — J1289 Other viral pneumonia: Secondary | ICD-10-CM | POA: Diagnosis present

## 2019-02-12 DIAGNOSIS — I10 Essential (primary) hypertension: Secondary | ICD-10-CM | POA: Diagnosis present

## 2019-02-12 DIAGNOSIS — K219 Gastro-esophageal reflux disease without esophagitis: Secondary | ICD-10-CM | POA: Diagnosis present

## 2019-02-12 DIAGNOSIS — G9340 Encephalopathy, unspecified: Secondary | ICD-10-CM

## 2019-02-12 DIAGNOSIS — Z833 Family history of diabetes mellitus: Secondary | ICD-10-CM

## 2019-02-12 DIAGNOSIS — E119 Type 2 diabetes mellitus without complications: Secondary | ICD-10-CM

## 2019-02-12 DIAGNOSIS — T380X5A Adverse effect of glucocorticoids and synthetic analogues, initial encounter: Secondary | ICD-10-CM | POA: Diagnosis present

## 2019-02-12 DIAGNOSIS — Z8249 Family history of ischemic heart disease and other diseases of the circulatory system: Secondary | ICD-10-CM

## 2019-02-12 DIAGNOSIS — E1122 Type 2 diabetes mellitus with diabetic chronic kidney disease: Secondary | ICD-10-CM | POA: Diagnosis present

## 2019-02-12 DIAGNOSIS — J9601 Acute respiratory failure with hypoxia: Secondary | ICD-10-CM | POA: Diagnosis present

## 2019-02-12 DIAGNOSIS — E1165 Type 2 diabetes mellitus with hyperglycemia: Secondary | ICD-10-CM | POA: Diagnosis present

## 2019-02-12 DIAGNOSIS — G9341 Metabolic encephalopathy: Secondary | ICD-10-CM | POA: Diagnosis present

## 2019-02-12 DIAGNOSIS — Z79899 Other long term (current) drug therapy: Secondary | ICD-10-CM

## 2019-02-12 DIAGNOSIS — F0390 Unspecified dementia without behavioral disturbance: Secondary | ICD-10-CM | POA: Diagnosis present

## 2019-02-12 DIAGNOSIS — D696 Thrombocytopenia, unspecified: Secondary | ICD-10-CM | POA: Diagnosis present

## 2019-02-12 DIAGNOSIS — U071 COVID-19: Principal | ICD-10-CM | POA: Diagnosis present

## 2019-02-12 DIAGNOSIS — Z7982 Long term (current) use of aspirin: Secondary | ICD-10-CM

## 2019-02-12 DIAGNOSIS — N179 Acute kidney failure, unspecified: Secondary | ICD-10-CM | POA: Diagnosis present

## 2019-02-12 DIAGNOSIS — Z96653 Presence of artificial knee joint, bilateral: Secondary | ICD-10-CM | POA: Diagnosis present

## 2019-02-12 DIAGNOSIS — G4733 Obstructive sleep apnea (adult) (pediatric): Secondary | ICD-10-CM | POA: Diagnosis present

## 2019-02-12 DIAGNOSIS — E871 Hypo-osmolality and hyponatremia: Secondary | ICD-10-CM | POA: Diagnosis present

## 2019-02-12 DIAGNOSIS — Z87891 Personal history of nicotine dependence: Secondary | ICD-10-CM

## 2019-02-12 DIAGNOSIS — N1831 Chronic kidney disease, stage 3a: Secondary | ICD-10-CM | POA: Diagnosis present

## 2019-02-12 DIAGNOSIS — I129 Hypertensive chronic kidney disease with stage 1 through stage 4 chronic kidney disease, or unspecified chronic kidney disease: Secondary | ICD-10-CM | POA: Diagnosis present

## 2019-02-12 LAB — CBC WITH DIFFERENTIAL/PLATELET
Abs Immature Granulocytes: 0.02 10*3/uL (ref 0.00–0.07)
Basophils Absolute: 0 10*3/uL (ref 0.0–0.1)
Basophils Relative: 0 %
Eosinophils Absolute: 0 10*3/uL (ref 0.0–0.5)
Eosinophils Relative: 0 %
HCT: 34.9 % — ABNORMAL LOW (ref 39.0–52.0)
Hemoglobin: 12.1 g/dL — ABNORMAL LOW (ref 13.0–17.0)
Immature Granulocytes: 0 %
Lymphocytes Relative: 13 %
Lymphs Abs: 0.6 10*3/uL — ABNORMAL LOW (ref 0.7–4.0)
MCH: 29.7 pg (ref 26.0–34.0)
MCHC: 34.7 g/dL (ref 30.0–36.0)
MCV: 85.7 fL (ref 80.0–100.0)
Monocytes Absolute: 0.2 10*3/uL (ref 0.1–1.0)
Monocytes Relative: 4 %
Neutro Abs: 4.1 10*3/uL (ref 1.7–7.7)
Neutrophils Relative %: 83 %
Platelets: 115 10*3/uL — ABNORMAL LOW (ref 150–400)
RBC: 4.07 MIL/uL — ABNORMAL LOW (ref 4.22–5.81)
RDW: 12.3 % (ref 11.5–15.5)
WBC: 4.9 10*3/uL (ref 4.0–10.5)
nRBC: 0 % (ref 0.0–0.2)

## 2019-02-12 LAB — LACTIC ACID, PLASMA: Lactic Acid, Venous: 1.4 mmol/L (ref 0.5–1.9)

## 2019-02-12 LAB — LACTATE DEHYDROGENASE: LDH: 219 U/L — ABNORMAL HIGH (ref 98–192)

## 2019-02-12 LAB — COMPREHENSIVE METABOLIC PANEL
ALT: 19 U/L (ref 0–44)
AST: 34 U/L (ref 15–41)
Albumin: 3.1 g/dL — ABNORMAL LOW (ref 3.5–5.0)
Alkaline Phosphatase: 55 U/L (ref 38–126)
Anion gap: 11 (ref 5–15)
BUN: 25 mg/dL — ABNORMAL HIGH (ref 8–23)
CO2: 22 mmol/L (ref 22–32)
Calcium: 8 mg/dL — ABNORMAL LOW (ref 8.9–10.3)
Chloride: 96 mmol/L — ABNORMAL LOW (ref 98–111)
Creatinine, Ser: 1.2 mg/dL (ref 0.61–1.24)
GFR calc Af Amer: >60 mL/min (ref >60)
GFR calc non Af Amer: 58 mL/min — ABNORMAL LOW (ref >60)
Glucose, Bld: 148 mg/dL — ABNORMAL HIGH (ref 70–99)
Potassium: 3.6 mmol/L (ref 3.5–5.1)
Sodium: 129 mmol/L — ABNORMAL LOW (ref 135–145)
Total Bilirubin: 0.6 mg/dL (ref 0.3–1.2)
Total Protein: 6.3 g/dL — ABNORMAL LOW (ref 6.5–8.1)

## 2019-02-12 LAB — TRIGLYCERIDES: Triglycerides: 76 mg/dL (ref <150)

## 2019-02-12 LAB — PROCALCITONIN: Procalcitonin: <0.1 ng/mL

## 2019-02-12 LAB — FIBRINOGEN: Fibrinogen: 510 mg/dL — ABNORMAL HIGH (ref 210–475)

## 2019-02-12 LAB — FERRITIN: Ferritin: 467 ng/mL — ABNORMAL HIGH (ref 24–336)

## 2019-02-12 LAB — C-REACTIVE PROTEIN: CRP: 6 mg/dL — ABNORMAL HIGH (ref <1.0)

## 2019-02-12 LAB — D-DIMER, QUANTITATIVE: D-Dimer, Quant: 0.8 ug/mL-FEU — ABNORMAL HIGH (ref 0.00–0.50)

## 2019-02-12 NOTE — ED Provider Notes (Signed)
[facility] DEPT Provider Note   CSN: [id number] Arrival date & time: 02/12/19  1959     History No chief complaint on file.   Eric Richmond is a 78 y.o. male hx of DM, dementia here with confusion. Patient was diagnosed with COVID 2 days ago. He went back home from the ED and per the wife he has worsening confusion .  He has been wandering around the house and getting into things that he was not supposed to .  He is also has been weaker and appears to have some shortness of breath. Patient unable to give me much history.  The history is provided by the patient and a relative.  Level V caveat- condition of patient      Past Medical History:  Diagnosis Date  . Allergy   . Anal fissure 20 years ago  . Diabetes mellitus without complication ([place]) 0000000  . ED (erectile dysfunction)   . Essential hypertension 10/22/2006   Qualifier: Diagnosis of  By: Sarajane Jews MD, Ishmael Holter   . GERD (gastroesophageal reflux disease)   . Hard of hearing   . 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 OSA (obstructive sleep apnea)    per Dr. Gwenette Greet , cpap -2.0  . Osteoarthritis    Right knee and right thumb; s/p total knee replacement  . Pneumonia 2015    Patient Active Problem List   Diagnosis Date Noted  . 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 Environmental and seasonal allergies 05/20/2018  . Postoperative stiffness of total knee replacement ([place]) 01/12/2014  . OA (osteoarthritis) of knee 10/26/2013  . Diabetes mellitus without complication ([place]) 123XX123  . OSA (obstructive sleep apnea) 07/21/2010  . Essential hypertension 10/22/2006  . GERD (gastroesophageal reflux disease) 10/22/2006    Past Surgical History:  Procedure Laterality Date  . ANAL FISSURE REPAIR    . colonoscopy  08/07/2016   per Dr. Hilarie Fredrickson, adenomatous polyps removed, repeat in 3 years   . KNEE ARTHROSCOPY     left knee  . KNEE CLOSED REDUCTION Left 01/13/2014   Procedure:  CLOSED MANIPULATION LEFT KNEE;  Surgeon: Gearlean Alf, MD;  Location: [facility];  Service: Orthopedics;  Laterality: Left;  . SURGERY AGE 70 OR 7 ON LEFT KNEE FOR A "GLAND BEHIND THE KNEE"    . TOTAL KNEE ARTHROPLASTY Left 10/26/2013   Procedure: LEFT TOTAL KNEE ARTHROPLASTY;  Surgeon: Gearlean Alf, MD;  Location: [facility];  Service: Orthopedics;  Laterality: Left;       Family History  Problem Relation Age of Onset  . Breast cancer Sister   . Diabetes Sister   . Hypertension Sister   . Thyroid cancer Sister   . Diabetes Mother   . Hypertension Mother   . Diabetes Father   . Hypertension Father   . Diabetes Sister   . Hypertension Other   . Diabetes Brother   . Hypertension Brother   . Heart failure Brother   . Heart failure Brother   . Diabetes Sister   . Colon cancer Neg Hx   . Esophageal cancer Neg Hx   . Rectal cancer Neg Hx   . Stomach cancer Neg Hx     Social History   Tobacco Use  . Smoking status: Former Smoker    Packs/day: 0.30    Years: 20.00    Pack years: 6.00    Types: Cigarettes    Quit date: 02/26/1977    Years since quitting: 41.9  . Smokeless  tobacco: Former Systems developer    Types: Chew  Substance Use Topics  . Alcohol use: No    Alcohol/week: 0.0 standard drinks  . Drug use: No    Home Medications Prior to Admission medications   Medication Sig Start Date End Date Taking? Authorizing Provider  albuterol (VENTOLIN HFA) 108 (90 Base) MCG/ACT inhaler Inhale 2 puffs into the lungs every 6 (six) hours as needed for wheezing or shortness of breath. 02/11/18  Yes [name], MD  amLODipine (NORVASC) 5 MG tablet Take 1 tablet (5 mg total) by mouth daily. 01/05/19  Yes [name], MD  aspirin EC 81 MG tablet Take 81 mg by mouth daily.   Yes [provider]  atenolol (TENORMIN) 50 MG tablet TAKE 1 TABLET EVERY DAY (NEED MD APPOINTMENT FOR REFILLS) Patient taking differently: Take 50 mg by mouth daily.  07/09/18  Yes [name], MD  fluticasone  (FLONASE) 50 MCG/ACT nasal spray Place 2 sprays into both nostrils daily. Patient taking differently: Place 2 sprays into both nostrils daily as needed for allergies.  10/21/17  Yes [name], MD  losartan-hydrochlorothiazide (HYZAAR) 100-25 MG tablet Take 1 tablet by mouth every morning. 12/29/18  Yes [name], MD  memantine (NAMENDA) 10 MG tablet Take 1 tablet (10 mg total) by mouth 2 (two) times daily. 12/02/18  Yes Cameron Sprang, MD  Multiple Vitamin (MULTIVITAMIN WITH MINERALS) TABS tablet Take 1 tablet by mouth daily.   Yes [provider]  omeprazole (PRILOSEC) 40 MG capsule TAKE 1 CAPSULE EVERY DAY Patient taking differently: Take 40 mg by mouth daily.  07/09/18  Yes [name], MD    Allergies    Oxycodone and Tramadol  Review of Systems   Review of Systems  Neurological: Positive for weakness.  Psychiatric/Behavioral: Positive for confusion.  All other systems reviewed and are negative.   Physical Exam Updated Vital Signs BP 117/70   Pulse 65   Temp 99.1 F (37.3 C) (Oral)   Resp 14   Ht 5\' 8"  (1.727 m)   Wt 90.7 kg   SpO2 96%   BMI 30.41 kg/m   Physical Exam Vitals and nursing note reviewed.  Constitutional:      Comments: Confused,   HENT:     Head: Normocephalic.     Nose: Nose normal.     Mouth/Throat:     Mouth: Mucous membranes are moist.  Eyes:     Extraocular Movements: Extraocular movements intact.     Pupils: Pupils are equal, round, and reactive to light.  Cardiovascular:     Rate and Rhythm: Normal rate and regular rhythm.  Pulmonary:     Effort: Pulmonary effort is normal.     Breath sounds: Normal breath sounds.  Abdominal:     General: Abdomen is flat.     Palpations: Abdomen is soft.  Musculoskeletal:        General: Normal range of motion.     Cervical back: Normal range of motion.  Skin:    General: Skin is warm.     Capillary Refill: Capillary refill takes less than 2 seconds.  Neurological:     Comments: A&  O x 2, strength 4/5 bilaterally   Psychiatric:        Mood and Affect: Mood normal.     ED Results / Procedures / Treatments   Labs (all labs ordered are listed, but only abnormal results are displayed) Labs Reviewed  CBC WITH DIFFERENTIAL/PLATELET -  Abnormal; Notable for the following components:      Result Value   RBC 4.07 (*)    Hemoglobin 12.1 (*)    HCT 34.9 (*)    Platelets 115 (*)    Lymphs Abs 0.6 (*)    All other components within normal limits  COMPREHENSIVE METABOLIC PANEL - Abnormal; Notable for the following components:   Sodium 129 (*)    Chloride 96 (*)    Glucose, Bld 148 (*)    BUN 25 (*)    Calcium 8.0 (*)    Total Protein 6.3 (*)    Albumin 3.1 (*)    GFR calc non Af Amer 58 (*)    All other components within normal limits  D-DIMER, QUANTITATIVE (NOT AT [facility]) - Abnormal; Notable for the following components:   D-Dimer, Quant 0.80 (*)    All other components within normal limits  LACTATE DEHYDROGENASE - Abnormal; Notable for the following components:   LDH 219 (*)    All other components within normal limits  FERRITIN - Abnormal; Notable for the following components:   Ferritin 467 (*)    All other components within normal limits  FIBRINOGEN - Abnormal; Notable for the following components:   Fibrinogen 510 (*)    All other components within normal limits  C-REACTIVE PROTEIN - Abnormal; Notable for the following components:   CRP 6.0 (*)    All other components within normal limits  CULTURE, BLOOD (ROUTINE X 2)  CULTURE, BLOOD (ROUTINE X 2)  LACTIC ACID, PLASMA  TRIGLYCERIDES  LACTIC ACID, PLASMA  PROCALCITONIN    EKG EKG Interpretation  Date/Time:  Thursday February 12 2019 20:14:16 EST Ventricular Rate:  77 PR Interval:    QRS Duration: 96 QT Interval:  383 QTC Calculation: 434 R Axis:   32 Text Interpretation: Sinus rhythm No significant change since last tracing Confirmed by Wandra Arthurs [phone]) on 02/12/2019 9:02:09  PM   Radiology DG Chest Port 1 View  Result Date: 02/12/2019 CLINICAL DATA:  Fever, weakness EXAM: PORTABLE CHEST 1 VIEW COMPARISON:  Radiograph 02/10/2019 FINDINGS: Low lung volumes with streaky bandlike opacities in lung bases favoring atelectatic change. More patchy opacities present in the periphery of the left lung and right infrahilar lung. No convincing features of edema. No pneumothorax or effusion. Cardiomediastinal contours are similar to prior accounting for low volumes and portable technique. No acute osseous or soft tissue abnormality. Degenerative changes are present in the imaged spine and shoulders. Metallic necklace at the base of the neck. IMPRESSION: 1. Low lung volumes with streaky bandlike opacities in the lung bases favoring atelectatic change. 2. More patchy opacities in the lung bases and left lung periphery may reflect additional atelectatic change or early infection. 3. No pneumothorax or effusion. Electronically Signed   By: Lovena Le M.D.   On: 02/12/2019 21:23    Procedures Procedures (including critical care time)  Medications Ordered in ED Medications - No data to display  ED Course  I have reviewed the triage vital signs and the nursing notes.  Pertinent labs & imaging results that were available during my care of the patient were reviewed by me and considered in my medical decision making (see chart for details).    MDM Rules/Calculators/A&P                     Eric Richmond is a 78 y.o. male here with confusion. Recently diagnosed with COVID. Likely has encephalopathy from [place]. Will get  labs, UA, CXR, COVID preadmission order set.   10:09 PM Inflammatory markers elevated. CXR consistent with COVID. Hospitalist to admit for encephalopathy from [place].    Eric Richmond was evaluated in Emergency Department on 02/12/2019 for the symptoms described in the history of present illness. He was evaluated in the context of the global COVID-19 pandemic,  which necessitated consideration that the patient might be at risk for infection with the SARS-CoV-2 virus that causes COVID-19. Institutional protocols and algorithms that pertain to the evaluation of patients at risk for COVID-19 are in a state of rapid change based on information released by regulatory bodies including the CDC and federal and state organizations. These policies and algorithms were followed during the patient's care in the ED.  Final Clinical Impression(s) / ED Diagnoses Final diagnoses:  None    Rx / DC Orders ED Discharge Orders    None       Drenda Freeze, MD 02/12/19 2210

## 2019-02-12 NOTE — ED Triage Notes (Signed)
Pt tested postive for COVID two days ago, when his wife noticed that he was becoming confused and not following commands EMS states he has a fever of 103 and they gave 1000mg  of tylenol Per EMS pt doesn't answer questions appropriately

## 2019-02-13 DIAGNOSIS — E1165 Type 2 diabetes mellitus with hyperglycemia: Secondary | ICD-10-CM | POA: Diagnosis present

## 2019-02-13 DIAGNOSIS — F0390 Unspecified dementia without behavioral disturbance: Secondary | ICD-10-CM | POA: Diagnosis present

## 2019-02-13 DIAGNOSIS — J988 Other specified respiratory disorders: Secondary | ICD-10-CM

## 2019-02-13 DIAGNOSIS — U071 COVID-19: Secondary | ICD-10-CM | POA: Diagnosis present

## 2019-02-13 DIAGNOSIS — E871 Hypo-osmolality and hyponatremia: Secondary | ICD-10-CM | POA: Diagnosis present

## 2019-02-13 DIAGNOSIS — R739 Hyperglycemia, unspecified: Secondary | ICD-10-CM | POA: Diagnosis not present

## 2019-02-13 DIAGNOSIS — R410 Disorientation, unspecified: Secondary | ICD-10-CM | POA: Diagnosis present

## 2019-02-13 DIAGNOSIS — J1289 Other viral pneumonia: Secondary | ICD-10-CM

## 2019-02-13 DIAGNOSIS — Z9989 Dependence on other enabling machines and devices: Secondary | ICD-10-CM

## 2019-02-13 DIAGNOSIS — J9601 Acute respiratory failure with hypoxia: Secondary | ICD-10-CM | POA: Diagnosis present

## 2019-02-13 DIAGNOSIS — Z833 Family history of diabetes mellitus: Secondary | ICD-10-CM | POA: Diagnosis not present

## 2019-02-13 DIAGNOSIS — Z7982 Long term (current) use of aspirin: Secondary | ICD-10-CM | POA: Diagnosis not present

## 2019-02-13 DIAGNOSIS — G9341 Metabolic encephalopathy: Secondary | ICD-10-CM | POA: Diagnosis present

## 2019-02-13 DIAGNOSIS — I129 Hypertensive chronic kidney disease with stage 1 through stage 4 chronic kidney disease, or unspecified chronic kidney disease: Secondary | ICD-10-CM | POA: Diagnosis present

## 2019-02-13 DIAGNOSIS — E1122 Type 2 diabetes mellitus with diabetic chronic kidney disease: Secondary | ICD-10-CM | POA: Diagnosis present

## 2019-02-13 DIAGNOSIS — Z8249 Family history of ischemic heart disease and other diseases of the circulatory system: Secondary | ICD-10-CM | POA: Diagnosis not present

## 2019-02-13 DIAGNOSIS — T380X5A Adverse effect of glucocorticoids and synthetic analogues, initial encounter: Secondary | ICD-10-CM

## 2019-02-13 DIAGNOSIS — K219 Gastro-esophageal reflux disease without esophagitis: Secondary | ICD-10-CM | POA: Diagnosis present

## 2019-02-13 DIAGNOSIS — N179 Acute kidney failure, unspecified: Secondary | ICD-10-CM | POA: Diagnosis present

## 2019-02-13 DIAGNOSIS — E119 Type 2 diabetes mellitus without complications: Secondary | ICD-10-CM | POA: Diagnosis not present

## 2019-02-13 DIAGNOSIS — G4733 Obstructive sleep apnea (adult) (pediatric): Secondary | ICD-10-CM

## 2019-02-13 DIAGNOSIS — F0280 Dementia in other diseases classified elsewhere without behavioral disturbance: Secondary | ICD-10-CM

## 2019-02-13 DIAGNOSIS — Z96653 Presence of artificial knee joint, bilateral: Secondary | ICD-10-CM | POA: Diagnosis present

## 2019-02-13 DIAGNOSIS — G9340 Encephalopathy, unspecified: Secondary | ICD-10-CM | POA: Diagnosis not present

## 2019-02-13 DIAGNOSIS — N183 Chronic kidney disease, stage 3 unspecified: Secondary | ICD-10-CM

## 2019-02-13 DIAGNOSIS — Z87891 Personal history of nicotine dependence: Secondary | ICD-10-CM | POA: Diagnosis not present

## 2019-02-13 DIAGNOSIS — N1831 Chronic kidney disease, stage 3a: Secondary | ICD-10-CM | POA: Diagnosis present

## 2019-02-13 DIAGNOSIS — Z79899 Other long term (current) drug therapy: Secondary | ICD-10-CM | POA: Diagnosis not present

## 2019-02-13 DIAGNOSIS — D696 Thrombocytopenia, unspecified: Secondary | ICD-10-CM | POA: Diagnosis present

## 2019-02-13 DIAGNOSIS — I10 Essential (primary) hypertension: Secondary | ICD-10-CM

## 2019-02-13 LAB — BLOOD CULTURE ID PANEL (REFLEXED)
Acinetobacter baumannii: NOT DETECTED
Acinetobacter baumannii: NOT DETECTED
Candida albicans: NOT DETECTED
Candida albicans: NOT DETECTED
Candida glabrata: NOT DETECTED
Candida glabrata: NOT DETECTED
Candida krusei: NOT DETECTED
Candida krusei: NOT DETECTED
Candida parapsilosis: NOT DETECTED
Candida parapsilosis: NOT DETECTED
Candida tropicalis: NOT DETECTED
Candida tropicalis: NOT DETECTED
Enterobacter cloacae complex: NOT DETECTED
Enterobacter cloacae complex: NOT DETECTED
Enterobacteriaceae species: NOT DETECTED
Enterobacteriaceae species: NOT DETECTED
Enterococcus species: NOT DETECTED
Enterococcus species: NOT DETECTED
Escherichia coli: NOT DETECTED
Escherichia coli: NOT DETECTED
Haemophilus influenzae: NOT DETECTED
Haemophilus influenzae: NOT DETECTED
Klebsiella oxytoca: NOT DETECTED
Klebsiella oxytoca: NOT DETECTED
Klebsiella pneumoniae: NOT DETECTED
Klebsiella pneumoniae: NOT DETECTED
Listeria monocytogenes: NOT DETECTED
Listeria monocytogenes: NOT DETECTED
Methicillin resistance: NOT DETECTED
Neisseria meningitidis: NOT DETECTED
Neisseria meningitidis: NOT DETECTED
Proteus species: NOT DETECTED
Proteus species: NOT DETECTED
Pseudomonas aeruginosa: NOT DETECTED
Pseudomonas aeruginosa: NOT DETECTED
Serratia marcescens: NOT DETECTED
Serratia marcescens: NOT DETECTED
Staphylococcus aureus (BCID): NOT DETECTED
Staphylococcus aureus (BCID): NOT DETECTED
Staphylococcus species: DETECTED — AB
Staphylococcus species: NOT DETECTED
Streptococcus agalactiae: NOT DETECTED
Streptococcus agalactiae: NOT DETECTED
Streptococcus pneumoniae: NOT DETECTED
Streptococcus pneumoniae: NOT DETECTED
Streptococcus pyogenes: NOT DETECTED
Streptococcus pyogenes: NOT DETECTED
Streptococcus species: NOT DETECTED
Streptococcus species: NOT DETECTED

## 2019-02-13 LAB — CBC
HCT: 37 % — ABNORMAL LOW (ref 39.0–52.0)
Hemoglobin: 12.4 g/dL — ABNORMAL LOW (ref 13.0–17.0)
MCH: 29.3 pg (ref 26.0–34.0)
MCHC: 33.5 g/dL (ref 30.0–36.0)
MCV: 87.5 fL (ref 80.0–100.0)
Platelets: 108 10*3/uL — ABNORMAL LOW (ref 150–400)
RBC: 4.23 MIL/uL (ref 4.22–5.81)
RDW: 12.4 % (ref 11.5–15.5)
WBC: 5.2 10*3/uL (ref 4.0–10.5)
nRBC: 0 % (ref 0.0–0.2)

## 2019-02-13 LAB — CREATININE, SERUM
Creatinine, Ser: 1.17 mg/dL (ref 0.61–1.24)
GFR calc Af Amer: >60 mL/min (ref >60)
GFR calc non Af Amer: 59 mL/min — ABNORMAL LOW (ref >60)

## 2019-02-13 LAB — ABO/RH: ABO/RH(D): O POS

## 2019-02-13 MED ORDER — SODIUM CHLORIDE 0.9 % IV SOLN
100.0000 mg | Freq: Every day | INTRAVENOUS | Status: DC
  Administered 2019-02-14 – 2019-02-16 (×3): 100 mg via INTRAVENOUS
  Filled 2019-02-13 (×3): qty 100
  Filled 2019-02-13: qty 20

## 2019-02-13 MED ORDER — ADULT MULTIVITAMIN W/MINERALS CH
1.0000 | ORAL_TABLET | Freq: Every day | ORAL | Status: DC
  Administered 2019-02-13 – 2019-02-16 (×4): 1 via ORAL
  Filled 2019-02-13 (×4): qty 1

## 2019-02-13 MED ORDER — ALBUTEROL SULFATE HFA 108 (90 BASE) MCG/ACT IN AERS: 2.0000 | INHALATION_SPRAY | RESPIRATORY_TRACT | Status: DC | PRN

## 2019-02-13 MED ORDER — SODIUM CHLORIDE 0.9 % IV SOLN
200.0000 mg | Freq: Once | INTRAVENOUS | Status: AC
  Administered 2019-02-13: 01:00:00 200 mg via INTRAVENOUS
  Filled 2019-02-13: qty 200

## 2019-02-13 MED ORDER — ZINC SULFATE 220 (50 ZN) MG PO CAPS
220.0000 mg | ORAL_CAPSULE | Freq: Every day | ORAL | Status: DC
  Administered 2019-02-13 – 2019-02-16 (×4): 220 mg via ORAL
  Filled 2019-02-13 (×4): qty 1

## 2019-02-13 MED ORDER — IPRATROPIUM BROMIDE HFA 17 MCG/ACT IN AERS
2.0000 | INHALATION_SPRAY | Freq: Four times a day (QID) | RESPIRATORY_TRACT | Status: DC
  Filled 2019-02-13: qty 12.9

## 2019-02-13 MED ORDER — GUAIFENESIN-DM 100-10 MG/5ML PO SYRP: 10.0000 mL | ORAL_SOLUTION | ORAL | Status: DC | PRN

## 2019-02-13 MED ORDER — ASCORBIC ACID 500 MG PO TABS
500.0000 mg | ORAL_TABLET | Freq: Every day | ORAL | Status: DC
  Administered 2019-02-13 – 2019-02-16 (×4): 500 mg via ORAL
  Filled 2019-02-13 (×4): qty 1

## 2019-02-13 MED ORDER — ACETAMINOPHEN 325 MG PO TABS: 650.0000 mg | ORAL_TABLET | Freq: Four times a day (QID) | ORAL | Status: DC | PRN

## 2019-02-13 MED ORDER — DEXAMETHASONE SODIUM PHOSPHATE 10 MG/ML IJ SOLN
6.0000 mg | Freq: Every day | INTRAMUSCULAR | Status: DC
  Administered 2019-02-13 – 2019-02-15 (×4): 6 mg via INTRAVENOUS
  Filled 2019-02-13 (×4): qty 1

## 2019-02-13 MED ORDER — MEMANTINE HCL 10 MG PO TABS
10.0000 mg | ORAL_TABLET | Freq: Two times a day (BID) | ORAL | Status: DC
  Administered 2019-02-13 – 2019-02-16 (×7): 10 mg via ORAL
  Filled 2019-02-13 (×7): qty 1

## 2019-02-13 MED ORDER — ENOXAPARIN SODIUM 40 MG/0.4ML ~~LOC~~ SOLN
40.0000 mg | Freq: Every day | SUBCUTANEOUS | Status: DC
  Administered 2019-02-13 – 2019-02-15 (×4): 40 mg via SUBCUTANEOUS
  Filled 2019-02-13 (×4): qty 0.4

## 2019-02-13 MED ORDER — ATENOLOL 25 MG PO TABS
25.0000 mg | ORAL_TABLET | Freq: Every day | ORAL | Status: DC
  Administered 2019-02-13 – 2019-02-15 (×3): 25 mg via ORAL
  Filled 2019-02-13 (×4): qty 1

## 2019-02-13 MED ORDER — PANTOPRAZOLE SODIUM 40 MG PO TBEC
40.0000 mg | DELAYED_RELEASE_TABLET | Freq: Every day | ORAL | Status: DC
  Administered 2019-02-13 – 2019-02-16 (×4): 40 mg via ORAL
  Filled 2019-02-13 (×4): qty 1

## 2019-02-13 MED ORDER — AMLODIPINE BESYLATE 5 MG PO TABS
5.0000 mg | ORAL_TABLET | Freq: Every day | ORAL | Status: DC
  Administered 2019-02-13 – 2019-02-16 (×4): 5 mg via ORAL
  Filled 2019-02-13 (×4): qty 1

## 2019-02-13 MED ORDER — ASPIRIN EC 81 MG PO TBEC
81.0000 mg | DELAYED_RELEASE_TABLET | Freq: Every day | ORAL | Status: DC
  Administered 2019-02-13 – 2019-02-16 (×4): 81 mg via ORAL
  Filled 2019-02-13 (×4): qty 1

## 2019-02-13 NOTE — Progress Notes (Signed)
PROGRESS NOTE  Eric Richmond [id number] DOB: [birth year]   PCP: [name], MD  Patient is from: Home.  Independently ambulates at baseline.  Fairly oriented although he is forgetful and does not keep up with time consistently.  DOA: 02/12/2019 LOS: 0  Brief Narrative / Interim history: 78 year old male with history of HTN, DM-2, OSA, CKD-3, osteoarthritis, dementia and GERD presenting with altered mental status, fever, dyspnea on exertion, weakness and intermittent cough and admitted for pneumonia due to COVID-19 infection and acute metabolic encephalopathy.  Reportedly tested positive for COVID-19 2 days prior to admission and was discharged home as patient was asymptomatic at that time.  However, his overall condition gotten have worse and had to return to ED.  In ED, confused but follows simple commands.  Febrile to 103.  Hemodynamically stable.  Saturation 96% on room air.  Hgb 12.1.  Platelet 115.  Sodium 129.  Glucose 148.  Inflammatory markers elevated.  Lactic acid and procalcitonin normal.  CXR with bilateral basal opacities.  Blood cultures obtained.  Started on Decadron and remdesivir and admitted.  Subjective: No major events overnight of this morning.  Awake and alert but only oriented to self and his wife's name.  Follows simple commands.  Responds no to chest pain, shortness of breath, cough, nausea, vomiting, abdominal pain or UTI symptoms.  Sitting in bed eating breakfast.  Objective: Vitals:   02/13/19 0030 02/13/19 0154 02/13/19 0540 02/13/19 1217  BP: (!) 143/76 (!) 145/86 (!) 109/57 (!) 148/88  Pulse: 69 72 69 73  Resp: 17 16 16 18   Temp:  98.3 F (36.8 C) 99 F (37.2 C) 97.7 F (36.5 C)  TempSrc:  Oral Oral Oral  SpO2: 96% 99% 98% 98%  Weight:  81.8 kg    Height:  5\' 8"  (1.727 m)      Intake/Output Summary (Last 24 hours) at 02/13/2019 1228 Last data filed at 02/13/2019 0300 Gross per 24 hour  Intake 290.43 ml  Output --  Net 290.43 ml   Filed  Weights   02/12/19 2146 02/13/19 0154  Weight: 90.7 kg 81.8 kg    Examination:  GENERAL: No apparent distress.  Nontoxic. HEENT: MMM.  Vision and hearing grossly intact.  NECK: Supple.  No apparent JVD.  RESP:  No IWOB. Good air movement bilaterally. CVS:  RRR. Heart sounds normal.  ABD/GI/GU: Bowel sounds present. Soft. Non tender.  MSK/EXT:  No apparent deformity or edema. Moves extremities. SKIN: no apparent skin lesion or wound NEURO: Awake, alert and oriented self and family name only.  No apparent focal neuro deficit. PSYCH: Calm. Normal affect.  Procedures:  None  Assessment & Plan: Pneumonia due to COVID-19: positive CXR and elevated inflammatory markers.  Lactic acid and pro Cal within normal.  Reportedly had dyspnea on exertion, weakness and cough per H&P but patient denies.  He is not a reliable historian due to his mental status. Recent Labs    02/12/19 2051  DDIMER 0.80*  FERRITIN 467*  LDH 219*  CRP 6.0*  -Continue Decadron and remdesivir 12/17>>> -Supportive care with inhalers, mucolytic's, antitussives, vitamins and incentive spirometry. -OOB  Acute metabolic encephalopathy in patient with history of dementia due to COVID-19 infection as above Awake and alert.  Oriented to self and family name only.  No apparent focal neuro deficit. -Treat COVID-19 infection as above. -Frequent reorientation and delirium precautions. -Continue home memantine.  Steroid-induced hyperglycemia and patient with diet controlled diabetes: A1c 6.2% on 12/15/2018. -Start CBG monitoring, SSI  and Tradjenta. -Start statin.  Hyponatremia: Na 129.  Likely due to thiazide -Hold thiazide. -Continue monitoring.  Essential hypertension: Blood pressure was in fair range. -Start home amlodipine  and reduced dose of atenolol -Hold losartan and thiazide in the setting of hyponatremia.  History of OSA on CPAP at night -Nightly oxygen as needed here.  CKD-3a: Stable.  Baseline likely  from 1.1-1.2. -Cr 1.20 (admit)> 1.17  Thrombocytopenia: Stable. -Continue monitoring              DVT prophylaxis: Subcu Lovenox Code Status: Full code Family Communication: Updated patient's wife over the phone. Disposition Plan: Remains inpatient. Consultants: None   Microbiology summarized: 12/15-COVID-19 positive. 12/17-blood cultures pending.  Sch Meds:  Scheduled Meds: . amLODipine  5 mg Oral Daily  . vitamin C  500 mg Oral Daily  . aspirin EC  81 mg Oral Daily  . atenolol  25 mg Oral Daily  . dexamethasone (DECADRON) injection  6 mg Intravenous QHS  . enoxaparin (LOVENOX) injection  40 mg Subcutaneous QHS  . memantine  10 mg Oral BID  . multivitamin with minerals  1 tablet Oral Daily  . zinc sulfate  220 mg Oral Daily   Continuous Infusions: . [START ON 02/14/2019] remdesivir 100 mg in NS 100 mL     PRN Meds:.acetaminophen, albuterol, guaiFENesin-dextromethorphan  Antimicrobials: Anti-infectives (From admission, onward)   Start     Dose/Rate Route Frequency Ordered Stop   02/14/19 1000  remdesivir 100 mg in sodium chloride 0.9 % 100 mL IVPB     100 mg 200 mL/hr over 30 Minutes Intravenous Daily 02/13/19 0054 02/18/19 0959   02/13/19 0100  remdesivir 200 mg in sodium chloride 0.9% 250 mL IVPB     200 mg 580 mL/hr over 30 Minutes Intravenous Once 02/13/19 0054 02/13/19 0143       I have personally reviewed the following labs and images: CBC: Recent Labs  Lab 02/10/19 1033 02/12/19 2051 02/13/19 0242  WBC 4.6 4.9 5.2  NEUTROABS  --  4.1  --   HGB 12.9* 12.1* 12.4*  HCT 37.6* 34.9* 37.0*  MCV 87.6 85.7 87.5  PLT 102* 115* 108*   BMP &GFR Recent Labs  Lab 02/10/19 1033 02/12/19 2051 02/13/19 0242  NA 133* 129*  --   K 3.5 3.6  --   CL 97* 96*  --   CO2 25 22  --   GLUCOSE 126* 148*  --   BUN 25* 25*  --   CREATININE 1.22 1.20 1.17  CALCIUM 8.5* 8.0*  --    Estimated Creatinine Clearance: 50.3 mL/min (by C-G formula based on SCr of  1.17 mg/dL). Liver & Pancreas: Recent Labs  Lab 02/12/19 2051  AST 34  ALT 19  ALKPHOS 55  BILITOT 0.6  PROT 6.3*  ALBUMIN 3.1*   No results for input(s): LIPASE, AMYLASE in the last 168 hours. No results for input(s): AMMONIA in the last 168 hours. Diabetic: No results for input(s): HGBA1C in the last 72 hours. No results for input(s): GLUCAP in the last 168 hours. Cardiac Enzymes: No results for input(s): CKTOTAL, CKMB, CKMBINDEX, TROPONINI in the last 168 hours. No results for input(s): PROBNP in the last 8760 hours. Coagulation Profile: No results for input(s): INR, PROTIME in the last 168 hours. Thyroid Function Tests: No results for input(s): TSH, T4TOTAL, FREET4, T3FREE, THYROIDAB in the last 72 hours. Lipid Profile: Recent Labs    02/12/19 2051  TRIG 76   Anemia Panel: Recent Labs  02/12/19 2051  FERRITIN 467*   Urine analysis:    Component Value Date/Time   COLORURINE AMBER (A) 11/07/2013 1059   APPEARANCEUR CLOUDY (A) 11/07/2013 1059   LABSPEC 1.025 11/07/2013 1059   PHURINE 6.0 11/07/2013 1059   GLUCOSEU NEGATIVE 11/07/2013 1059   HGBUR TRACE (A) 11/07/2013 1059   HGBUR 1+ 11/21/2009 0951   BILIRUBINUR - 12/15/2018 1611   KETONESUR NEGATIVE 11/07/2013 1059   PROTEINUR Negative 12/15/2018 1611   PROTEINUR NEGATIVE 11/07/2013 1059   UROBILINOGEN 0.2 12/15/2018 1611   UROBILINOGEN 1.0 11/07/2013 1059   NITRITE - 12/15/2018 1611   NITRITE NEGATIVE 11/07/2013 1059   LEUKOCYTESUR Negative 12/15/2018 1611   Sepsis Labs: Invalid input(s): PROCALCITONIN, [place]  Microbiology: No results found for this or any previous visit (from the past 240 hour(s)).  Radiology Studies: DG Chest Port 1 View  Result Date: 02/12/2019 CLINICAL DATA:  Fever, weakness EXAM: PORTABLE CHEST 1 VIEW COMPARISON:  Radiograph 02/10/2019 FINDINGS: Low lung volumes with streaky bandlike opacities in lung bases favoring atelectatic change. More patchy opacities present in  the periphery of the left lung and right infrahilar lung. No convincing features of edema. No pneumothorax or effusion. Cardiomediastinal contours are similar to prior accounting for low volumes and portable technique. No acute osseous or soft tissue abnormality. Degenerative changes are present in the imaged spine and shoulders. Metallic necklace at the base of the neck. IMPRESSION: 1. Low lung volumes with streaky bandlike opacities in the lung bases favoring atelectatic change. 2. More patchy opacities in the lung bases and left lung periphery may reflect additional atelectatic change or early infection. 3. No pneumothorax or effusion. Electronically Signed   By: Lovena Le M.D.   On: 02/12/2019 21:23     [employer]  If 7PM-7AM, please contact night-coverage [email or web address] Password [facility] 02/13/2019, 12:28 PM

## 2019-02-13 NOTE — Progress Notes (Signed)
Pharmacy: Remdesivir   Patient is a 78 y.o. male with COVID.  Pharmacy has been consulted for remdesivir dosing.   -CXR shows "More patchy opacities in the lung bases and left lung periphery may reflect additional atelectatic change or early infection."  -Pt requiring supplemental oxygen (No, 96% RA)  -ALT 19    A/P:  - Patient meets criteria for remdesivir. Will initiate remdesivir 200 mg once followed by 100 mg daily x 4 days.  - Daily CMET while on remdesivir - Will f/u pt's ALT and clinical condition

## 2019-02-13 NOTE — TOC Progression Note (Signed)
Transition of Care [facility]) - Progression Note    Patient Details  Name: Eric Richmond MRN: [id number] Date of Birth: June 13, 1940  [facility] of Care [facility] CM/SW Contact  Purcell Mouton, RN Phone Number: 02/13/2019, 5:15 PM  Clinical Narrative:     TOC will continue to follow for discharge needs.       Expected Discharge Plan and Services                                                 Social Determinants of Health (SDOH) Interventions    Readmission Risk Interventions No flowsheet data found.

## 2019-02-13 NOTE — Progress Notes (Signed)
PHARMACY - PHYSICIAN COMMUNICATION CRITICAL VALUE ALERT - BLOOD CULTURE IDENTIFICATION (BCID)  Eric Richmond is an 78 y.o. male who presented to [facility] on 02/12/2019 with a chief complaint of confusion  Assessment:  Patient is COVID + and BCID result in only one set of blood cultures.   One BCID (-), second (staphylococcus species, No MecA) (include suspected source if known)  Name of physician (or Provider) Contacted: X. Blount, NP  Current antibiotics: none  Changes to prescribed antibiotics recommended:  Recommendations accepted by provider---No abx at this time.  Results for orders placed or performed during the hospital encounter of 02/12/19  Blood Culture ID Panel (Reflexed) (Collected: 02/12/2019  8:51 PM)  Result Value Ref Range   Enterococcus species NOT DETECTED NOT DETECTED   Listeria monocytogenes NOT DETECTED NOT DETECTED   Staphylococcus species NOT DETECTED NOT DETECTED   Staphylococcus aureus (BCID) NOT DETECTED NOT DETECTED   Streptococcus species NOT DETECTED NOT DETECTED   Streptococcus agalactiae NOT DETECTED NOT DETECTED   Streptococcus pneumoniae NOT DETECTED NOT DETECTED   Streptococcus pyogenes NOT DETECTED NOT DETECTED   Acinetobacter baumannii NOT DETECTED NOT DETECTED   Enterobacteriaceae species NOT DETECTED NOT DETECTED   Enterobacter cloacae complex NOT DETECTED NOT DETECTED   Escherichia coli NOT DETECTED NOT DETECTED   Klebsiella oxytoca NOT DETECTED NOT DETECTED   Klebsiella pneumoniae NOT DETECTED NOT DETECTED   Proteus species NOT DETECTED NOT DETECTED   Serratia marcescens NOT DETECTED NOT DETECTED   Haemophilus influenzae NOT DETECTED NOT DETECTED   Neisseria meningitidis NOT DETECTED NOT DETECTED   Pseudomonas aeruginosa NOT DETECTED NOT DETECTED   Candida albicans NOT DETECTED NOT DETECTED   Candida glabrata NOT DETECTED NOT DETECTED   Candida krusei NOT DETECTED NOT DETECTED   Candida parapsilosis NOT DETECTED NOT DETECTED   Candida tropicalis NOT DETECTED NOT DETECTED    [name] 02/13/2019  11:32 PM

## 2019-02-13 NOTE — H&P (Signed)
History and Physical    Eric Richmond [id number] DOB: Dec 31, 1940 DOA: 02/12/2019  PCP: [name], MD  Patient coming from: Home    Chief Complaint: Confusion and shortness of breath  HPI: Eric Richmond is a 78 y.o. male with medical history significant of hypertension, diabetes mellitus without complication, GERD and mild neurocognitive disorder presented to ED for evaluation of worsening confusion.  Patient is pleasantly confused and unable to answer most of the questions appropriately therefore history is gathered from ED physician.  Patient was brought to the ED by EMS and as per EMS patient had a fever of 103 and was given 1000 mg of Tylenol on the way to the hospital.  Patient was diagnosed positive for COVID-19 2 days ago and he was asymptomatic and discharged home from the ED but after reaching home his condition continue to be worse as per patient's wife.  Patient started having worsening confusion and has been wandering around the house and doing the things which he was not doing at his baseline.  Patient was also having shortness of breath with exertion weakness and episodic cough.  During my evaluation, patient is having mild cough and shortness of breath with coughing but saturating well at room air.  Although the patient is very confused but he is able to follow simple verbal commands and denies headache, dizziness, chest pain, difficulty with breathing, nausea, vomiting, abdominal pain and urinary symptoms.  ED Course: On arrival to the ED patient was confused and his blood pressure was 117/70, heart rate 65, temperature 99.1, respiratory rate 14 and oxygen saturation of 96% on room air.  X-ray chest was positive for bilateral basal opacities and was explaining the picture of Covid infection.  Review of Systems: Although the patient is pleasantly confused but he was able to answer most of review of systems questions appropriately as explained in HPI   Past Medical  History:  Diagnosis Date  . Allergy   . Anal fissure 20 years ago  . Diabetes mellitus without complication ([place]) 0000000  . ED (erectile dysfunction)   . Essential hypertension 10/22/2006   Qualifier: Diagnosis of  By: Sarajane Jews MD, Ishmael Holter   . GERD (gastroesophageal reflux disease)   . Hard of hearing   . 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 OSA (obstructive sleep apnea)    per Dr. Gwenette Greet , cpap -2.0  . Osteoarthritis    Right knee and right thumb; s/p total knee replacement  . Pneumonia 2015    Past Surgical History:  Procedure Laterality Date  . ANAL FISSURE REPAIR    . colonoscopy  08/07/2016   per Dr. Hilarie Fredrickson, adenomatous polyps removed, repeat in 3 years   . KNEE ARTHROSCOPY     left knee  . KNEE CLOSED REDUCTION Left 01/13/2014   Procedure: CLOSED MANIPULATION LEFT KNEE;  Surgeon: Gearlean Alf, MD;  Location: [facility];  Service: Orthopedics;  Laterality: Left;  . SURGERY AGE 9 OR 7 ON LEFT KNEE FOR A "GLAND BEHIND THE KNEE"    . TOTAL KNEE ARTHROPLASTY Left 10/26/2013   Procedure: LEFT TOTAL KNEE ARTHROPLASTY;  Surgeon: Gearlean Alf, MD;  Location: [facility];  Service: Orthopedics;  Laterality: Left;     reports that he quit smoking about 41 years ago. His smoking use included cigarettes. He has a 6.00 pack-year smoking history. He has quit using smokeless tobacco.  His smokeless tobacco use included chew. He reports that he does not drink alcohol or use drugs.  Allergies  Allergen Reactions  . Oxycodone     Hallucinations   . Tramadol     Hallucinations     Family History  Problem Relation Age of Onset  . Breast cancer Sister   . Diabetes Sister   . Hypertension Sister   . Thyroid cancer Sister   . Diabetes Mother   . Hypertension Mother   . Diabetes Father   . Hypertension Father   . Diabetes Sister   . Hypertension Other   . Diabetes Brother   . Hypertension Brother   . Heart failure Brother   . Heart failure Brother   . Diabetes Sister   .  Colon cancer Neg Hx   . Esophageal cancer Neg Hx   . Rectal cancer Neg Hx   . Stomach cancer Neg Hx    Patient was unable to give family history but it is collected from previous medical records   Prior to Admission medications   Medication Sig Start Date End Date Taking? Authorizing Provider  albuterol (VENTOLIN HFA) 108 (90 Base) MCG/ACT inhaler Inhale 2 puffs into the lungs every 6 (six) hours as needed for wheezing or shortness of breath. 02/11/18  Yes [name], MD  amLODipine (NORVASC) 5 MG tablet Take 1 tablet (5 mg total) by mouth daily. 01/05/19  Yes [name], MD  aspirin EC 81 MG tablet Take 81 mg by mouth daily.   Yes [provider]  atenolol (TENORMIN) 50 MG tablet TAKE 1 TABLET EVERY DAY (NEED MD APPOINTMENT FOR REFILLS) Patient taking differently: Take 50 mg by mouth daily.  07/09/18  Yes [name], MD  fluticasone (FLONASE) 50 MCG/ACT nasal spray Place 2 sprays into both nostrils daily. Patient taking differently: Place 2 sprays into both nostrils daily as needed for allergies.  10/21/17  Yes [name], MD  losartan-hydrochlorothiazide (HYZAAR) 100-25 MG tablet Take 1 tablet by mouth every morning. 12/29/18  Yes [name], MD  memantine (NAMENDA) 10 MG tablet Take 1 tablet (10 mg total) by mouth 2 (two) times daily. 12/02/18  Yes Cameron Sprang, MD  Multiple Vitamin (MULTIVITAMIN WITH MINERALS) TABS tablet Take 1 tablet by mouth daily.   Yes [provider]  omeprazole (PRILOSEC) 40 MG capsule TAKE 1 CAPSULE EVERY DAY Patient taking differently: Take 40 mg by mouth daily.  07/09/18  Yes [name], MD    Physical Exam: Vitals:   02/12/19 2146 02/12/19 2200 02/12/19 2244 02/13/19 0030  BP:  110/75 127/67 (!) 143/76  Pulse:  64 80 69  Resp:  14 16 17   Temp:      TempSrc:      SpO2:  96% 93% 96%  Weight: 90.7 kg     Height: 5\' 8"  (1.727 m)       Constitutional: NAD, calm, comfortable Vitals:   02/12/19 2146 02/12/19 2200  02/12/19 2244 02/13/19 0030  BP:  110/75 127/67 (!) 143/76  Pulse:  64 80 69  Resp:  14 16 17   Temp:      TempSrc:      SpO2:  96% 93% 96%  Weight: 90.7 kg     Height: 5\' 8"  (1.727 m)      Eyes: PERRL, lids and conjunctivae normal ENMT: Mucous membranes are moist. Posterior pharynx clear of any exudate or lesions.Normal dentition.  Neck: normal, supple, no masses, no thyromegaly Respiratory: Patient is not in acute respiratory distress.  Patient is still having episodic cough and having some  shortness of breath with coughing but still saturating well on room air.  Diminished breath sounds in bilateral lower lobes on auscultation but no wheezing, Rales or rhonchi.  No accessory muscle use.  Cardiovascular: Regular rate and rhythm, no murmurs / rubs / gallops. No extremity edema. 2+ pedal pulses. No carotid bruits.  Abdomen: no tenderness, no masses palpated. No hepatosplenomegaly. Bowel sounds positive.  Musculoskeletal: no clubbing / cyanosis. No joint deformity upper and lower extremities. Good ROM, no contractures. Normal muscle tone.  Skin: no rashes, lesions, ulcers. No induration Neurologic: Patient is awake and alert but not oriented to time person place and situation because of confusion.  Cranial nerves II to XII grossly intact.  Speech is normal but patient is asking questions that are not making any sense.  Sensation intact, DTR normal. Strength 5/5 in all 4.  Psychiatric: Patient is pleasantly confused.   Labs on Admission: I have personally reviewed following labs and imaging studies  CBC: Recent Labs  Lab 02/10/19 1033 02/12/19 2051  WBC 4.6 4.9  NEUTROABS  --  4.1  HGB 12.9* 12.1*  HCT 37.6* 34.9*  MCV 87.6 85.7  PLT 102* [id number]*   Basic Metabolic Panel: Recent Labs  Lab 02/10/19 1033 02/12/19 2051  NA 133* 129*  K 3.5 3.6  CL 97* 96*  CO2 25 22  GLUCOSE 126* 148*  BUN 25* 25*  CREATININE 1.22 1.20  CALCIUM 8.5* 8.0*   GFR: Estimated Creatinine Clearance:  55.5 mL/min (by C-G formula based on SCr of 1.2 mg/dL). Liver Function Tests: Recent Labs  Lab 02/12/19 2051  AST 34  ALT 19  ALKPHOS 55  BILITOT 0.6  PROT 6.3*  ALBUMIN 3.1*   No results for input(s): LIPASE, AMYLASE in the last 168 hours. No results for input(s): AMMONIA in the last 168 hours. Coagulation Profile: No results for input(s): INR, PROTIME in the last 168 hours. Cardiac Enzymes: No results for input(s): CKTOTAL, CKMB, CKMBINDEX, TROPONINI in the last 168 hours. BNP (last 3 results) No results for input(s): PROBNP in the last 8760 hours. HbA1C: No results for input(s): HGBA1C in the last 72 hours. CBG: No results for input(s): GLUCAP in the last 168 hours. Lipid Profile: Recent Labs    02/12/19 2051  TRIG 76   Thyroid Function Tests: No results for input(s): TSH, T4TOTAL, FREET4, T3FREE, THYROIDAB in the last 72 hours. Anemia Panel: Recent Labs    02/12/19 2051  FERRITIN 467*   Urine analysis:    Component Value Date/Time   COLORURINE AMBER (A) 11/07/2013 1059   APPEARANCEUR CLOUDY (A) 11/07/2013 1059   LABSPEC 1.025 11/07/2013 1059   PHURINE 6.0 11/07/2013 1059   GLUCOSEU NEGATIVE 11/07/2013 1059   HGBUR TRACE (A) 11/07/2013 1059   HGBUR 1+ 11/21/2009 0951   BILIRUBINUR - 12/15/2018 1611   KETONESUR NEGATIVE 11/07/2013 1059   PROTEINUR Negative 12/15/2018 1611   PROTEINUR NEGATIVE 11/07/2013 1059   UROBILINOGEN 0.2 12/15/2018 1611   UROBILINOGEN 1.0 11/07/2013 1059   NITRITE - 12/15/2018 1611   NITRITE NEGATIVE 11/07/2013 1059   LEUKOCYTESUR Negative 12/15/2018 1611    Radiological Exams on Admission: DG Chest Port 1 View  Result Date: 02/12/2019 CLINICAL DATA:  Fever, weakness EXAM: PORTABLE CHEST 1 VIEW COMPARISON:  Radiograph 02/10/2019 FINDINGS: Low lung volumes with streaky bandlike opacities in lung bases favoring atelectatic change. More patchy opacities present in the periphery of the left lung and right infrahilar lung. No  convincing features of edema. No pneumothorax or effusion. Cardiomediastinal  contours are similar to prior accounting for low volumes and portable technique. No acute osseous or soft tissue abnormality. Degenerative changes are present in the imaged spine and shoulders. Metallic necklace at the base of the neck. IMPRESSION: 1. Low lung volumes with streaky bandlike opacities in the lung bases favoring atelectatic change. 2. More patchy opacities in the lung bases and left lung periphery may reflect additional atelectatic change or early infection. 3. No pneumothorax or effusion. Electronically Signed   By: Lovena Le M.D.   On: 02/12/2019 21:23    EKG: Independently reviewed.  EKG is normal sinus rhythm with no acute ST and T wave changes.  EKG is similar to the previous EKG  Assessment/Plan Principal Problem:   Respiratory tract infection due to COVID-19 virus Patient is started on remdesivir as per protocol IV Decadron 6 mg daily Albuterol/ipratropium for shortness of breath Robitussin syrup for cough as needed Vitamin C and zinc. Oxygen supplementation nasal cannula as needed  Active Problems:   Essential hypertension Blood pressure stable Will continue home medications    Diabetes mellitus without complication ([place]) Blood glucose is well controlled. Continue to monitor    Acute confusion Acute confusion is on top of mild neurocognitive disorder most likely secondary to Covid infection.  Patient is already started on medications to treat Covid infection.  Continue to monitor.    DVT prophylaxis: Lovenox  Code Status: Full code  Family Communication:  Disposition Plan: Patient will be discharged to home after he came back to his normal baseline  Consults called:  Admission status: Inpatient/medical floor    Edmonia Lynch MD [facility]  If 7PM-7AM, please contact night-coverage [email or web address]  02/13/2019, 1:14 AM

## 2019-02-13 NOTE — ED Notes (Signed)
[name] updated on his room assignment

## 2019-02-14 DIAGNOSIS — F0390 Unspecified dementia without behavioral disturbance: Secondary | ICD-10-CM

## 2019-02-14 DIAGNOSIS — N179 Acute kidney failure, unspecified: Secondary | ICD-10-CM

## 2019-02-14 LAB — CBC WITH DIFFERENTIAL/PLATELET
Abs Immature Granulocytes: 0.03 10*3/uL (ref 0.00–0.07)
Basophils Absolute: 0 10*3/uL (ref 0.0–0.1)
Basophils Relative: 0 %
Eosinophils Absolute: 0 10*3/uL (ref 0.0–0.5)
Eosinophils Relative: 0 %
HCT: 37.4 % — ABNORMAL LOW (ref 39.0–52.0)
Hemoglobin: 13 g/dL (ref 13.0–17.0)
Immature Granulocytes: 0 %
Lymphocytes Relative: 14 %
Lymphs Abs: 0.9 10*3/uL (ref 0.7–4.0)
MCH: 29.5 pg (ref 26.0–34.0)
MCHC: 34.8 g/dL (ref 30.0–36.0)
MCV: 85 fL (ref 80.0–100.0)
Monocytes Absolute: 0.4 10*3/uL (ref 0.1–1.0)
Monocytes Relative: 6 %
Neutro Abs: 5.3 10*3/uL (ref 1.7–7.7)
Neutrophils Relative %: 80 %
Platelets: 158 10*3/uL (ref 150–400)
RBC: 4.4 MIL/uL (ref 4.22–5.81)
RDW: 12.4 % (ref 11.5–15.5)
WBC: 6.7 10*3/uL (ref 4.0–10.5)
nRBC: 0 % (ref 0.0–0.2)

## 2019-02-14 LAB — COMPREHENSIVE METABOLIC PANEL
ALT: 18 U/L (ref 0–44)
AST: 31 U/L (ref 15–41)
Albumin: 3.4 g/dL — ABNORMAL LOW (ref 3.5–5.0)
Alkaline Phosphatase: 57 U/L (ref 38–126)
Anion gap: 10 (ref 5–15)
BUN: 29 mg/dL — ABNORMAL HIGH (ref 8–23)
CO2: 24 mmol/L (ref 22–32)
Calcium: 8.9 mg/dL (ref 8.9–10.3)
Chloride: 99 mmol/L (ref 98–111)
Creatinine, Ser: 0.97 mg/dL (ref 0.61–1.24)
GFR calc Af Amer: >60 mL/min (ref >60)
GFR calc non Af Amer: >60 mL/min (ref >60)
Glucose, Bld: 145 mg/dL — ABNORMAL HIGH (ref 70–99)
Potassium: 3.8 mmol/L (ref 3.5–5.1)
Sodium: 133 mmol/L — ABNORMAL LOW (ref 135–145)
Total Bilirubin: 0.8 mg/dL (ref 0.3–1.2)
Total Protein: 6.7 g/dL (ref 6.5–8.1)

## 2019-02-14 LAB — CULTURE, BLOOD (ROUTINE X 2): Special Requests: ADEQUATE

## 2019-02-14 LAB — LACTATE DEHYDROGENASE: LDH: 242 U/L — ABNORMAL HIGH (ref 98–192)

## 2019-02-14 LAB — C-REACTIVE PROTEIN: CRP: 7.2 mg/dL — ABNORMAL HIGH (ref <1.0)

## 2019-02-14 LAB — FERRITIN: Ferritin: 438 ng/mL — ABNORMAL HIGH (ref 24–336)

## 2019-02-14 LAB — D-DIMER, QUANTITATIVE: D-Dimer, Quant: 0.67 ug/mL-FEU — ABNORMAL HIGH (ref 0.00–0.50)

## 2019-02-14 MED ORDER — LINAGLIPTIN 5 MG PO TABS
5.0000 mg | ORAL_TABLET | Freq: Every day | ORAL | Status: DC
  Administered 2019-02-14 – 2019-02-16 (×3): 5 mg via ORAL
  Filled 2019-02-14 (×3): qty 1

## 2019-02-14 NOTE — Plan of Care (Signed)
  Problem: Clinical Measurements: Goal: Diagnostic test results will improve Outcome: Progressing Goal: Respiratory complications will improve Outcome: Progressing   Problem: Activity: Goal: Risk for activity intolerance will decrease Outcome: Progressing   Problem: Elimination: Goal: Will not experience complications related to urinary retention Outcome: Progressing   Problem: Pain Managment: Goal: General experience of comfort will improve Outcome: Progressing

## 2019-02-14 NOTE — Progress Notes (Signed)
PT Note  Patient Details Name: Eric Richmond MRN: [id number] DOB: 11-13-1940  enterted pt room, pt walking around room, no LOB, turning, dancing etc. No mobility needs or PT needs at this time. Signing off.   Clide Dales 02/14/2019, 4:00 PM  Gatha Mayer, PT, MPT Acute Rehabilitation Services Office: [phone] 02/14/2019

## 2019-02-14 NOTE — Progress Notes (Signed)
PROGRESS NOTE  Eric Richmond [id number] DOB: 10/22/1940   PCP: [name], MD  Patient is from: Home.  Independently ambulates at baseline.  Fairly oriented although he is forgetful and does not keep up with time consistently.  DOA: 02/12/2019 LOS: 1  Brief Narrative / Interim history: 78 year old male with history of HTN, DM-2, OSA, CKD-3, osteoarthritis, dementia and GERD presenting with altered mental status, fever, dyspnea on exertion, weakness and intermittent cough and admitted for pneumonia due to COVID-19 infection and acute metabolic encephalopathy.  Reportedly tested positive for COVID-19 2 days prior to admission and was discharged home as patient was asymptomatic at that time.  However, his overall condition gotten have worse and had to return to ED.  In ED, confused but follows simple commands.  Febrile to 103.  Hemodynamically stable.  Saturation 96% on room air.  Hgb 12.1.  Platelet 115.  Sodium 129.  Glucose 148.  Inflammatory markers elevated.  Lactic acid and procalcitonin normal.  CXR with bilateral basal opacities.  Blood cultures obtained.  Started on Decadron and remdesivir and admitted.  Subjective: No major events overnight of this morning.  No complaints.  Patient is a very poor historian due to underlying dementia.  He is oriented to self, family and the city. He says this is where doctors work.  He responds no to pain, shortness of breath or abdominal pain.   Objective: Vitals:   02/13/19 1217 02/13/19 2019 02/14/19 0526 02/14/19 1219  BP: (!) 148/88 134/84 112/82 126/81  Pulse: 73 (!) 55 (!) 51 (!) 53  Resp: 18 20 18    Temp: 97.7 F (36.5 C) 97.7 F (36.5 C) 98.1 F (36.7 C)   TempSrc: Oral Oral    SpO2: 98% 97% 99%   Weight:      Height:        Intake/Output Summary (Last 24 hours) at 02/14/2019 1316 Last data filed at 02/13/2019 2200 Gross per 24 hour  Intake 250 ml  Output --  Net 250 ml   Filed Weights   02/12/19 2146 02/13/19 0154   Weight: 90.7 kg 81.8 kg    Examination:  GENERAL: No apparent distress.  Nontoxic. HEENT: MMM.  Vision and hearing grossly intact.  NECK: Supple.  No apparent JVD.  RESP:  No IWOB. Good air movement bilaterally. CVS:  RRR. Heart sounds normal.  ABD/GI/GU: Bowel sounds present. Soft. Non tender.  MSK/EXT:  Moves extremities. No apparent deformity or edema.  SKIN: no apparent skin lesion or wound NEURO: Awake, alert and oriented to self, family and partial place.  No apparent focal neuro deficit. PSYCH: Calm. Normal affect.  Procedures:  None  Assessment & Plan: Pneumonia due to COVID-19: positive CXR and elevated inflammatory markers.  Lactic acid and pro Cal within normal.  Reportedly had dyspnea on exertion, weakness and cough per H&P but patient denies.  He is not a reliable historian due to his mental status.  Saturating well on room air. Recent Labs    02/12/19 2051 02/14/19 0303  DDIMER 0.80* 0.67*  FERRITIN 467* 438*  LDH 219* 242*  CRP 6.0* 7.2*  -Continue Decadron and remdesivir 12/17>>> -Supportive care with inhalers, mucolytic's, antitussives, vitamins and incentive spirometry. -Monitor inflammatory markers. -OOB  Coag negative staph in 1 out of 2 bottles-likely contaminant. -No treatment needed.  Acute metabolic encephalopathy in patient with history of dementia due to COVID-19 infection as above Awake and alert.  Oriented to self, family name and partial place which is probably his  baseline.  No apparent focal neuro deficit. -Treat COVID-19 infection as above. -Frequent reorientation and delirium precautions. -Continue home memantine.  Steroid-induced hyperglycemia and patient with diet controlled diabetes: A1c 6.2% on 12/15/2018.  BMP glucose in 140s. -Start Tradjenta. -Will initiate SSI and CBG monitoring if significant hyperglycemia. -Start statin.  Hyponatremia: Na 129> 133.  Likely due to thiazide -Hold thiazide. -Continue monitoring.  Essential  hypertension: Blood pressure was in fair range. -Continue home amlodipine and reduced dose of home atenolol -Hold losartan and thiazide in the setting of hyponatremia.  History of OSA on CPAP at night -Nightly oxygen as needed here.  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              DVT prophylaxis: Subcu Lovenox Code Status: Full code Family Communication: Updated patient's wife over the phone from patient's home. Disposition Plan: Remains inpatient.  Could be discharged home with follow-up at [facility] for remdesivir infusion if remains stable in the next 24 to 48 hours. Consultants: None   Microbiology summarized: 12/15-COVID-19 positive. 12/17-blood cultures with coag negative staph in 1 out of 2 bottles.  Sch Meds:  Scheduled Meds: . amLODipine  5 mg Oral Daily  . vitamin C  500 mg Oral Daily  . aspirin EC  81 mg Oral Daily  . atenolol  25 mg Oral Daily  . dexamethasone (DECADRON) injection  6 mg Intravenous QHS  . enoxaparin (LOVENOX) injection  40 mg Subcutaneous QHS  . memantine  10 mg Oral BID  . multivitamin with minerals  1 tablet Oral Daily  . pantoprazole  40 mg Oral Daily  . zinc sulfate  220 mg Oral Daily   Continuous Infusions: . remdesivir 100 mg in NS 100 mL     PRN Meds:.acetaminophen, albuterol, guaiFENesin-dextromethorphan  Antimicrobials: Anti-infectives (From admission, onward)   Start     Dose/Rate Route Frequency Ordered Stop   02/14/19 1000  remdesivir 100 mg in sodium chloride 0.9 % 100 mL IVPB     100 mg 200 mL/hr over 30 Minutes Intravenous Daily 02/13/19 0054 02/18/19 0959   02/13/19 0100  remdesivir 200 mg in sodium chloride 0.9% 250 mL IVPB     200 mg 580 mL/hr over 30 Minutes Intravenous Once 02/13/19 0054 02/13/19 0143       I have personally reviewed the following labs and images: CBC: Recent Labs  Lab 02/10/19 1033 02/12/19 2051 02/13/19 0242  02/14/19 0303  WBC 4.6 4.9 5.2 6.7  NEUTROABS  --  4.1  --  5.3  HGB 12.9* 12.1* 12.4* 13.0  HCT 37.6* 34.9* 37.0* 37.4*  MCV 87.6 85.7 87.5 85.0  PLT 102* 115* 108* 158   BMP &GFR Recent Labs  Lab 02/10/19 1033 02/12/19 2051 02/13/19 0242 02/14/19 0303  NA 133* 129*  --  133*  K 3.5 3.6  --  3.8  CL 97* 96*  --  99  CO2 25 22  --  24  GLUCOSE 126* 148*  --  145*  BUN 25* 25*  --  29*  CREATININE 1.22 1.20 1.17 0.97  CALCIUM 8.5* 8.0*  --  8.9   Estimated Creatinine Clearance: 60.7 mL/min (by C-G formula based on SCr of 0.97 mg/dL). Liver & Pancreas: Recent Labs  Lab 02/12/19 2051 02/14/19 0303  AST 34 31  ALT 19 18  ALKPHOS 55 57  BILITOT 0.6 0.8  PROT 6.3* 6.7  ALBUMIN 3.1* 3.4*   No results for input(s):  LIPASE, AMYLASE in the last 168 hours. No results for input(s): AMMONIA in the last 168 hours. Diabetic: No results for input(s): HGBA1C in the last 72 hours. No results for input(s): GLUCAP in the last 168 hours. Cardiac Enzymes: No results for input(s): CKTOTAL, CKMB, CKMBINDEX, TROPONINI in the last 168 hours. No results for input(s): PROBNP in the last 8760 hours. Coagulation Profile: No results for input(s): INR, PROTIME in the last 168 hours. Thyroid Function Tests: No results for input(s): TSH, T4TOTAL, FREET4, T3FREE, THYROIDAB in the last 72 hours. Lipid Profile: Recent Labs    02/12/19 2051  TRIG 76   Anemia Panel: Recent Labs    02/12/19 2051 02/14/19 0303  FERRITIN 467* 438*   Urine analysis:    Component Value Date/Time   COLORURINE AMBER (A) 11/07/2013 1059   APPEARANCEUR CLOUDY (A) 11/07/2013 1059   LABSPEC 1.025 11/07/2013 1059   PHURINE 6.0 11/07/2013 1059   GLUCOSEU NEGATIVE 11/07/2013 1059   HGBUR TRACE (A) 11/07/2013 1059   HGBUR 1+ 11/21/2009 0951   BILIRUBINUR - 12/15/2018 1611   KETONESUR NEGATIVE 11/07/2013 1059   PROTEINUR Negative 12/15/2018 1611   PROTEINUR NEGATIVE 11/07/2013 1059   UROBILINOGEN 0.2 12/15/2018  1611   UROBILINOGEN 1.0 11/07/2013 1059   NITRITE - 12/15/2018 1611   NITRITE NEGATIVE 11/07/2013 1059   LEUKOCYTESUR Negative 12/15/2018 1611   Sepsis Labs: Invalid input(s): PROCALCITONIN, [place]  Microbiology: Recent Results (from the past 240 hour(s))  Blood Culture (routine x 2)     Status: None (Preliminary result)   Collection Time: 02/12/19  8:51 PM   Specimen: BLOOD  Result Value Ref Range Status   Specimen Description   Final    BLOOD LEFT ANTECUBITAL Performed at [facility] [address], [place], [place] [address]    Special Requests   Final    BOTTLES DRAWN AEROBIC AND ANAEROBIC Blood Culture results may not be optimal due to an excessive volume of blood received in culture bottles Performed at [facility] [address] [place], [place] [address]    Culture   Final    NO GROWTH < 24 HOURS Performed at [facility] [address], [place], [place] [address]    Report Status PENDING  Incomplete  Blood Culture ID Panel (Reflexed)     Status: None   Collection Time: 02/12/19  8:51 PM  Result Value Ref Range Status   Enterococcus species NOT DETECTED NOT DETECTED Final   Listeria monocytogenes NOT DETECTED NOT DETECTED Final   Staphylococcus species NOT DETECTED NOT DETECTED Final   Staphylococcus aureus (BCID) NOT DETECTED NOT DETECTED Final   Streptococcus species NOT DETECTED NOT DETECTED Final   Streptococcus agalactiae NOT DETECTED NOT DETECTED Final   Streptococcus pneumoniae NOT DETECTED NOT DETECTED Final   Streptococcus pyogenes NOT DETECTED NOT DETECTED Final   Acinetobacter baumannii NOT DETECTED NOT DETECTED Final   Enterobacteriaceae species NOT DETECTED NOT DETECTED Final   Enterobacter cloacae complex NOT DETECTED NOT DETECTED Final   Escherichia coli NOT DETECTED NOT DETECTED Final   Klebsiella oxytoca NOT DETECTED NOT DETECTED Final   Klebsiella pneumoniae NOT DETECTED NOT DETECTED Final    Proteus species NOT DETECTED NOT DETECTED Final   Serratia marcescens NOT DETECTED NOT DETECTED Final   Haemophilus influenzae NOT DETECTED NOT DETECTED Final   Neisseria meningitidis NOT DETECTED NOT DETECTED Final   Pseudomonas aeruginosa NOT DETECTED NOT DETECTED Final   Candida albicans NOT DETECTED NOT DETECTED Final  Candida glabrata NOT DETECTED NOT DETECTED Final   Candida krusei NOT DETECTED NOT DETECTED Final   Candida parapsilosis NOT DETECTED NOT DETECTED Final   Candida tropicalis NOT DETECTED NOT DETECTED Final    Comment: Performed at [facility], [place] [address] [place], [place] [address]  Blood Culture (routine x 2)     Status: None (Preliminary result)   Collection Time: 02/12/19  8:55 PM   Specimen: BLOOD  Result Value Ref Range Status   Specimen Description   Final    BLOOD RIGHT HAND Performed at [facility] [address], [place], [place] [address]    Special Requests   Final    BOTTLES DRAWN AEROBIC AND ANAEROBIC Blood Culture adequate volume Performed at [facility] [address] [place], [place] [address]    Culture  Setup Time   Final    ANAEROBIC BOTTLE ONLY GRAM POSITIVE COCCI AEROBIC BOTTLE ONLY GRAM VARIABLE ROD CRITICAL RESULT CALLED TO, READ BACK BY AND VERIFIED WITH: Lavell Luster [facility] 02/13/19 2127    Culture   Final    CULTURE REINCUBATED FOR BETTER GROWTH Performed at [facility], [place] [address], [place], [place] [address]    Report Status PENDING  Incomplete  Blood Culture ID Panel (Reflexed)     Status: Abnormal   Collection Time: 02/12/19  8:55 PM  Result Value Ref Range Status   Enterococcus species NOT DETECTED NOT DETECTED Final   Listeria monocytogenes NOT DETECTED NOT DETECTED Final   Staphylococcus species DETECTED (A) NOT DETECTED Final    Comment: Methicillin (oxacillin) susceptible coagulase negative staphylococcus. Possible blood culture contaminant (unless isolated from  more than one blood culture draw or clinical case suggests pathogenicity). No antibiotic treatment is indicated for blood  culture contaminants. CRITICAL RESULT CALLED TO, READ BACK BY AND VERIFIED WITH: Lavell Luster [facility] 02/13/19 2127 JDW    Staphylococcus aureus (BCID) NOT DETECTED NOT DETECTED Final   Methicillin resistance NOT DETECTED NOT DETECTED Final   Streptococcus species NOT DETECTED NOT DETECTED Final   Streptococcus agalactiae NOT DETECTED NOT DETECTED Final   Streptococcus pneumoniae NOT DETECTED NOT DETECTED Final   Streptococcus pyogenes NOT DETECTED NOT DETECTED Final   Acinetobacter baumannii NOT DETECTED NOT DETECTED Final   Enterobacteriaceae species NOT DETECTED NOT DETECTED Final   Enterobacter cloacae complex NOT DETECTED NOT DETECTED Final   Escherichia coli NOT DETECTED NOT DETECTED Final   Klebsiella oxytoca NOT DETECTED NOT DETECTED Final   Klebsiella pneumoniae NOT DETECTED NOT DETECTED Final   Proteus species NOT DETECTED NOT DETECTED Final   Serratia marcescens NOT DETECTED NOT DETECTED Final   Haemophilus influenzae NOT DETECTED NOT DETECTED Final   Neisseria meningitidis NOT DETECTED NOT DETECTED Final   Pseudomonas aeruginosa NOT DETECTED NOT DETECTED Final   Candida albicans NOT DETECTED NOT DETECTED Final   Candida glabrata NOT DETECTED NOT DETECTED Final   Candida krusei NOT DETECTED NOT DETECTED Final   Candida parapsilosis NOT DETECTED NOT DETECTED Final   Candida tropicalis NOT DETECTED NOT DETECTED Final    Comment: Performed at [facility], [place]. [address], [place], [place] [address]    Radiology Studies: No results found.   Ranie Chinchilla T. Paradis  If 7PM-7AM, please contact night-coverage [email or web address] Password [email or web address] 02/14/2019, 1:16 PM

## 2019-02-15 DIAGNOSIS — E119 Type 2 diabetes mellitus without complications: Secondary | ICD-10-CM

## 2019-02-15 DIAGNOSIS — R410 Disorientation, unspecified: Secondary | ICD-10-CM

## 2019-02-15 DIAGNOSIS — G9340 Encephalopathy, unspecified: Secondary | ICD-10-CM

## 2019-02-15 LAB — CBC WITH DIFFERENTIAL/PLATELET
Abs Immature Granulocytes: 0.04 10*3/uL (ref 0.00–0.07)
Basophils Absolute: 0 10*3/uL (ref 0.0–0.1)
Basophils Relative: 0 %
Eosinophils Absolute: 0 10*3/uL (ref 0.0–0.5)
Eosinophils Relative: 0 %
HCT: 37.6 % — ABNORMAL LOW (ref 39.0–52.0)
Hemoglobin: 12.9 g/dL — ABNORMAL LOW (ref 13.0–17.0)
Immature Granulocytes: 1 %
Lymphocytes Relative: 6 %
Lymphs Abs: 0.5 10*3/uL — ABNORMAL LOW (ref 0.7–4.0)
MCH: 29.3 pg (ref 26.0–34.0)
MCHC: 34.3 g/dL (ref 30.0–36.0)
MCV: 85.3 fL (ref 80.0–100.0)
Monocytes Absolute: 0.3 10*3/uL (ref 0.1–1.0)
Monocytes Relative: 3 %
Neutro Abs: 7.7 10*3/uL (ref 1.7–7.7)
Neutrophils Relative %: 90 %
Platelets: 201 10*3/uL (ref 150–400)
RBC: 4.41 MIL/uL (ref 4.22–5.81)
RDW: 12.5 % (ref 11.5–15.5)
WBC: 8.6 10*3/uL (ref 4.0–10.5)
nRBC: 0 % (ref 0.0–0.2)

## 2019-02-15 LAB — COMPREHENSIVE METABOLIC PANEL
ALT: 18 U/L (ref 0–44)
AST: 28 U/L (ref 15–41)
Albumin: 3.1 g/dL — ABNORMAL LOW (ref 3.5–5.0)
Alkaline Phosphatase: 54 U/L (ref 38–126)
Anion gap: 9 (ref 5–15)
BUN: 30 mg/dL — ABNORMAL HIGH (ref 8–23)
CO2: 25 mmol/L (ref 22–32)
Calcium: 8.8 mg/dL — ABNORMAL LOW (ref 8.9–10.3)
Chloride: 101 mmol/L (ref 98–111)
Creatinine, Ser: 0.96 mg/dL (ref 0.61–1.24)
GFR calc Af Amer: >60 mL/min (ref >60)
GFR calc non Af Amer: >60 mL/min (ref >60)
Glucose, Bld: 141 mg/dL — ABNORMAL HIGH (ref 70–99)
Potassium: 4.6 mmol/L (ref 3.5–5.1)
Sodium: 135 mmol/L (ref 135–145)
Total Bilirubin: 0.8 mg/dL (ref 0.3–1.2)
Total Protein: 6.6 g/dL (ref 6.5–8.1)

## 2019-02-15 LAB — FERRITIN: Ferritin: 396 ng/mL — ABNORMAL HIGH (ref 24–336)

## 2019-02-15 LAB — C-REACTIVE PROTEIN: CRP: 4 mg/dL — ABNORMAL HIGH (ref <1.0)

## 2019-02-15 LAB — D-DIMER, QUANTITATIVE: D-Dimer, Quant: 0.59 ug/mL-FEU — ABNORMAL HIGH (ref 0.00–0.50)

## 2019-02-15 LAB — LACTATE DEHYDROGENASE: LDH: 235 U/L — ABNORMAL HIGH (ref 98–192)

## 2019-02-15 NOTE — [place] Notes (Signed)
PROGRESS NOTE  Eric Richmond [id number] DOB: Mar 13, 1940 DOA: 02/12/2019 PCP: [name], MD   LOS: 2 days   Brief Narrative / Interim history: 78 year old male with history of HTN, DM-2, OSA, CKD-3, osteoarthritis, dementia and GERD presenting with altered mental status, fever, dyspnea on exertion, weakness and intermittent cough and admitted for pneumonia due to COVID-19 infection and acute metabolic encephalopathy.  Reportedly tested positive for COVID-19 2 days prior to admission and was discharged home as patient was asymptomatic at that time.  However, his overall condition gotten have worse and had to return to ED.  Subjective / 24h Interval events: Doing well, no complaints.  No shortness of breath  Assessment & Plan:  Principal Problem Acute Hypoxic Respiratory Failure due to Covid-19 Viral Illness -Hypoxia resolved, he is on room air.  Inflammatory markers improving -Continue Decadron, remdesivir for 2 more days   COVID-19 Labs  Recent Labs    02/12/19 2051 02/14/19 0303 02/15/19 0307  DDIMER 0.80* 0.67* 0.59*  FERRITIN 467* 438* 396*  LDH 219* 242* 235*  CRP 6.0* 7.2* 4.0*   No results found for: SARSCOV2NAA  Active Problems Coag negative staph 1/2 bottles -Likely contaminant  Acute metabolic encephalopathy with underlying dementia -Worsened in the setting of COVID-19  Hyponatremia -Improving, likely due to thiazide which is on hold  Essential hypertension -Continue amlodipine atenolol  History of OSA on CPAP -Nightly O2  Acute kidney injury -Has normalized  Thrombocytopenia -Platelets are normal  Scheduled Meds: . amLODipine  5 mg Oral Daily  . vitamin C  500 mg Oral Daily  . aspirin EC  81 mg Oral Daily  . atenolol  25 mg Oral Daily  . dexamethasone (DECADRON) injection  6 mg Intravenous QHS  . enoxaparin (LOVENOX) injection  40 mg Subcutaneous QHS  . linagliptin  5 mg Oral Daily  . memantine  10 mg Oral BID  . multivitamin with  minerals  1 tablet Oral Daily  . pantoprazole  40 mg Oral Daily  . zinc sulfate  220 mg Oral Daily   Continuous Infusions: . remdesivir 100 mg in NS 100 mL 100 mg (02/14/19 1814)   PRN Meds:.acetaminophen, albuterol, guaiFENesin-dextromethorphan  DVT prophylaxis: Lovenox Code Status: Full code Family Communication: d/w wife over the phone Disposition Plan: home when ready   Consultants:  None   Procedures:  none  Microbiology: none  Antimicrobials: none   Objective: Vitals:   02/15/19 0429 02/15/19 0941 02/15/19 0942 02/15/19 1245  BP: 130/84 122/70  126/81  Pulse: (!) 59  60 (!) 56  Resp: 18     Temp: 98.4 F (36.9 C)   97.9 F (36.6 C)  TempSrc:    Oral  SpO2: 95%   96%  Weight:      Height:        Intake/Output Summary (Last 24 hours) at 02/15/2019 1313 Last data filed at 02/15/2019 0600 Gross per 24 hour  Intake 480 ml  Output --  Net 480 ml   Filed Weights   02/12/19 2146 02/13/19 0154  Weight: 90.7 kg 81.8 kg    Examination:  Constitutional: NAD Eyes: no scleral icterus ENMT: Mucous membranes are moist.  Neck: normal, supple Respiratory: clear to auscultation bilaterally, no wheezing, no crackles. Normal respiratory effort Cardiovascular: Regular rate and rhythm, no murmurs / rubs / gallops. No LE edema. Abdomen: non distended, no tenderness. Bowel sounds positive.  Musculoskeletal: no clubbing / cyanosis.  Skin: no rashes Neurologic: CN 2-12 grossly intact. Strength 5/5  in all 4.   Data Reviewed: I have independently reviewed following labs and imaging studies   CBC: Recent Labs  Lab 02/10/19 1033 02/12/19 2051 02/13/19 0242 02/14/19 0303 02/15/19 0307  WBC 4.6 4.9 5.2 6.7 8.6  NEUTROABS  --  4.1  --  5.3 7.7  HGB 12.9* 12.1* 12.4* 13.0 12.9*  HCT 37.6* 34.9* 37.0* 37.4* 37.6*  MCV 87.6 85.7 87.5 85.0 85.3  PLT 102* 115* 108* 158 [id number]   Basic Metabolic Panel: Recent Labs  Lab 02/10/19 1033 02/12/19 2051 02/13/19 0242  02/14/19 0303 02/15/19 0307  NA 133* 129*  --  133* 135  K 3.5 3.6  --  3.8 4.6  CL 97* 96*  --  99 101  CO2 25 22  --  24 25  GLUCOSE 126* 148*  --  145* 141*  BUN 25* 25*  --  29* 30*  CREATININE 1.22 1.20 1.17 0.97 0.96  CALCIUM 8.5* 8.0*  --  8.9 8.8*   GFR: Estimated Creatinine Clearance: 61.4 mL/min (by C-G formula based on SCr of 0.96 mg/dL). Liver Function Tests: Recent Labs  Lab 02/12/19 2051 02/14/19 0303 02/15/19 0307  AST 34 31 28  ALT 19 18 18   ALKPHOS 55 57 54  BILITOT 0.6 0.8 0.8  PROT 6.3* 6.7 6.6  ALBUMIN 3.1* 3.4* 3.1*   No results for input(s): LIPASE, AMYLASE in the last 168 hours. No results for input(s): AMMONIA in the last 168 hours. Coagulation Profile: No results for input(s): INR, PROTIME in the last 168 hours. Cardiac Enzymes: No results for input(s): CKTOTAL, CKMB, CKMBINDEX, TROPONINI in the last 168 hours. BNP (last 3 results) No results for input(s): PROBNP in the last 8760 hours. HbA1C: No results for input(s): HGBA1C in the last 72 hours. CBG: No results for input(s): GLUCAP in the last 168 hours. Lipid Profile: Recent Labs    02/12/19 2051  TRIG 76   Thyroid Function Tests: No results for input(s): TSH, T4TOTAL, FREET4, T3FREE, THYROIDAB in the last 72 hours. Anemia Panel: Recent Labs    02/14/19 0303 02/15/19 0307  FERRITIN 438* 396*   Urine analysis:    Component Value Date/Time   COLORURINE AMBER (A) 11/07/2013 1059   APPEARANCEUR CLOUDY (A) 11/07/2013 1059   LABSPEC 1.025 11/07/2013 1059   PHURINE 6.0 11/07/2013 1059   GLUCOSEU NEGATIVE 11/07/2013 1059   HGBUR TRACE (A) 11/07/2013 1059   HGBUR 1+ 11/21/2009 0951   BILIRUBINUR - 12/15/2018 1611   KETONESUR NEGATIVE 11/07/2013 1059   PROTEINUR Negative 12/15/2018 1611   PROTEINUR NEGATIVE 11/07/2013 1059   UROBILINOGEN 0.2 12/15/2018 1611   UROBILINOGEN 1.0 11/07/2013 1059   NITRITE - 12/15/2018 1611   NITRITE NEGATIVE 11/07/2013 1059   LEUKOCYTESUR Negative  12/15/2018 1611   Sepsis Labs: Invalid input(s): PROCALCITONIN, LACTICIDVEN  Recent Results (from the past 240 hour(s))  Blood Culture (routine x 2)     Status: None (Preliminary result)   Collection Time: 02/12/19  8:51 PM   Specimen: BLOOD  Result Value Ref Range Status   Specimen Description   Final    BLOOD LEFT ANTECUBITAL Performed at [facility] [address], [place], [place] [address]    Special Requests   Final    BOTTLES DRAWN AEROBIC AND ANAEROBIC Blood Culture results may not be optimal due to an excessive volume of blood received in culture bottles Performed at [facility] [address] [place],  [address]    Culture   Final  NO GROWTH 2 DAYS Performed at [facility], [place] [address], [place], [place] [address]    Report Status PENDING  Incomplete  Blood Culture ID Panel (Reflexed)     Status: None   Collection Time: 02/12/19  8:51 PM  Result Value Ref Range Status   Enterococcus species NOT DETECTED NOT DETECTED Final   Listeria monocytogenes NOT DETECTED NOT DETECTED Final   Staphylococcus species NOT DETECTED NOT DETECTED Final   Staphylococcus aureus (BCID) NOT DETECTED NOT DETECTED Final   Streptococcus species NOT DETECTED NOT DETECTED Final   Streptococcus agalactiae NOT DETECTED NOT DETECTED Final   Streptococcus pneumoniae NOT DETECTED NOT DETECTED Final   Streptococcus pyogenes NOT DETECTED NOT DETECTED Final   Acinetobacter baumannii NOT DETECTED NOT DETECTED Final   Enterobacteriaceae species NOT DETECTED NOT DETECTED Final   Enterobacter cloacae complex NOT DETECTED NOT DETECTED Final   Escherichia coli NOT DETECTED NOT DETECTED Final   Klebsiella oxytoca NOT DETECTED NOT DETECTED Final   Klebsiella pneumoniae NOT DETECTED NOT DETECTED Final   Proteus species NOT DETECTED NOT DETECTED Final   Serratia marcescens NOT DETECTED NOT DETECTED Final   Haemophilus influenzae NOT DETECTED NOT  DETECTED Final   Neisseria meningitidis NOT DETECTED NOT DETECTED Final   Pseudomonas aeruginosa NOT DETECTED NOT DETECTED Final   Candida albicans NOT DETECTED NOT DETECTED Final   Candida glabrata NOT DETECTED NOT DETECTED Final   Candida krusei NOT DETECTED NOT DETECTED Final   Candida parapsilosis NOT DETECTED NOT DETECTED Final   Candida tropicalis NOT DETECTED NOT DETECTED Final    Comment: Performed at [facility], [place] [address], [place], [place] [address]  Blood Culture (routine x 2)     Status: Abnormal   Collection Time: 02/12/19  8:55 PM   Specimen: BLOOD  Result Value Ref Range Status   Specimen Description   Final    BLOOD RIGHT HAND Performed at [facility] [address], [place], [place] [address]    Special Requests   Final    BOTTLES DRAWN AEROBIC AND ANAEROBIC Blood Culture adequate volume Performed at [facility] [address], [place], [place] [address]    Culture  Setup Time   Final    ANAEROBIC BOTTLE ONLY GRAM POSITIVE COCCI AEROBIC BOTTLE ONLY GRAM VARIABLE ROD CRITICAL RESULT CALLED TO, READ BACK BY AND VERIFIED WITH: Lavell L[facility] 02/13/19 2127    Culture (A)  Final    STAPHYLOCOCCUS SPECIES (COAGULASE NEGATIVE) THE SIGNIFICANCE OF ISOLATING THIS ORGANISM FROM A SINGLE SET OF BLOOD CULTURES WHEN MULTIPLE SETS ARE DRAWN IS UNCERTAIN. PLEASE NOTIFY THE MICROBIOLOGY DEPARTMENT WITHIN ONE WEEK IF SPECIATION AND SENSITIVITIES ARE REQUIRED. BACILLUS SPECIES Standardized susceptibility testing for this organism is not available. Performed at [facility], [place] [address] [place], [place] [address]    Report Status 02/14/2019 FINAL  Final  Blood Culture ID Panel (Reflexed)     Status: Abnormal   Collection Time: 02/12/19  8:55 PM  Result Value Ref Range Status   Enterococcus species NOT DETECTED NOT DETECTED Final   Listeria monocytogenes NOT DETECTED NOT DETECTED Final   Staphylococcus species  DETECTED (A) NOT DETECTED Final    Comment: Methicillin (oxacillin) susceptible coagulase negative staphylococcus. Possible blood culture contaminant (unless isolated from more than one blood culture draw or clinical case suggests pathogenicity). No antibiotic treatment is indicated for blood  culture contaminants. CRITICAL RESULT CALLED TO, READ BACK BY AND VERIFIED WITH: J  GRIMSLEY [facility] 02/13/19 2127 JDW    Staphylococcus aureus (BCID) NOT DETECTED NOT DETECTED Final   Methicillin resistance NOT DETECTED NOT DETECTED Final   Streptococcus species NOT DETECTED NOT DETECTED Final   Streptococcus agalactiae NOT DETECTED NOT DETECTED Final   Streptococcus pneumoniae NOT DETECTED NOT DETECTED Final   Streptococcus pyogenes NOT DETECTED NOT DETECTED Final   Acinetobacter baumannii NOT DETECTED NOT DETECTED Final   Enterobacteriaceae species NOT DETECTED NOT DETECTED Final   Enterobacter cloacae complex NOT DETECTED NOT DETECTED Final   Escherichia coli NOT DETECTED NOT DETECTED Final   Klebsiella oxytoca NOT DETECTED NOT DETECTED Final   Klebsiella pneumoniae NOT DETECTED NOT DETECTED Final   Proteus species NOT DETECTED NOT DETECTED Final   Serratia marcescens NOT DETECTED NOT DETECTED Final   Haemophilus influenzae NOT DETECTED NOT DETECTED Final   Neisseria meningitidis NOT DETECTED NOT DETECTED Final   Pseudomonas aeruginosa NOT DETECTED NOT DETECTED Final   Candida albicans NOT DETECTED NOT DETECTED Final   Candida glabrata NOT DETECTED NOT DETECTED Final   Candida krusei NOT DETECTED NOT DETECTED Final   Candida parapsilosis NOT DETECTED NOT DETECTED Final   Candida tropicalis NOT DETECTED NOT DETECTED Final    Comment: Performed at [facility], [address], [place], [place] [address]     Radiology Studies: No results found.  Marzetta Board, MD, PhD [facility]  Contact via  [email or web address]  [place] P: [phone] F: [phone]

## 2019-02-15 NOTE — [place] Notes (Signed)
During shift change pt became agitated and wanting to leave the hospital. Security was called to assess the situation. Pt was packing his stuff up and wanting to leave. Mid level on call was notified. Order for wrist restraints was placed. I then had a long conversation with pt about his plan of care. Pt was finally cooperating and said he would stay the rest of the night. Restraints were not applied at this time. Will continue to monitor for need for restraints.

## 2019-02-16 LAB — CBC WITH DIFFERENTIAL/PLATELET
Abs Immature Granulocytes: 0.04 10*3/uL (ref 0.00–0.07)
Basophils Absolute: 0 10*3/uL (ref 0.0–0.1)
Basophils Relative: 0 %
Eosinophils Absolute: 0 10*3/uL (ref 0.0–0.5)
Eosinophils Relative: 0 %
HCT: 39.1 % (ref 39.0–52.0)
Hemoglobin: 13.5 g/dL (ref 13.0–17.0)
Immature Granulocytes: 0 %
Lymphocytes Relative: 5 %
Lymphs Abs: 0.5 10*3/uL — ABNORMAL LOW (ref 0.7–4.0)
MCH: 29.5 pg (ref 26.0–34.0)
MCHC: 34.5 g/dL (ref 30.0–36.0)
MCV: 85.6 fL (ref 80.0–100.0)
Monocytes Absolute: 0.3 10*3/uL (ref 0.1–1.0)
Monocytes Relative: 3 %
Neutro Abs: 8.1 10*3/uL — ABNORMAL HIGH (ref 1.7–7.7)
Neutrophils Relative %: 92 %
Platelets: 268 10*3/uL (ref 150–400)
RBC: 4.57 MIL/uL (ref 4.22–5.81)
RDW: 12.7 % (ref 11.5–15.5)
WBC: 8.9 10*3/uL (ref 4.0–10.5)
nRBC: 0 % (ref 0.0–0.2)

## 2019-02-16 LAB — FERRITIN: Ferritin: 443 ng/mL — ABNORMAL HIGH (ref 24–336)

## 2019-02-16 LAB — COMPREHENSIVE METABOLIC PANEL
ALT: 22 U/L (ref 0–44)
AST: 33 U/L (ref 15–41)
Albumin: 3.6 g/dL (ref 3.5–5.0)
Alkaline Phosphatase: 57 U/L (ref 38–126)
Anion gap: 11 (ref 5–15)
BUN: 30 mg/dL — ABNORMAL HIGH (ref 8–23)
CO2: 22 mmol/L (ref 22–32)
Calcium: 8.9 mg/dL (ref 8.9–10.3)
Chloride: 102 mmol/L (ref 98–111)
Creatinine, Ser: 0.94 mg/dL (ref 0.61–1.24)
GFR calc Af Amer: >60 mL/min (ref >60)
GFR calc non Af Amer: >60 mL/min (ref >60)
Glucose, Bld: 166 mg/dL — ABNORMAL HIGH (ref 70–99)
Potassium: 4.2 mmol/L (ref 3.5–5.1)
Sodium: 135 mmol/L (ref 135–145)
Total Bilirubin: 1 mg/dL (ref 0.3–1.2)
Total Protein: 7 g/dL (ref 6.5–8.1)

## 2019-02-16 LAB — LACTATE DEHYDROGENASE: LDH: 247 U/L — ABNORMAL HIGH (ref 98–192)

## 2019-02-16 LAB — D-DIMER, QUANTITATIVE: D-Dimer, Quant: 0.58 ug/mL-FEU — ABNORMAL HIGH (ref 0.00–0.50)

## 2019-02-16 LAB — C-REACTIVE PROTEIN: CRP: 3.8 mg/dL — ABNORMAL HIGH (ref <1.0)

## 2019-02-16 MED ORDER — ZINC SULFATE 220 (50 ZN) MG PO CAPS: 220.0000 mg | ORAL_CAPSULE | Freq: Every day | ORAL | 0 refills | Status: DC

## 2019-02-16 MED ORDER — GUAIFENESIN-DM 100-10 MG/5ML PO SYRP: 10.0000 mL | ORAL_SOLUTION | ORAL | 0 refills | Status: DC | PRN

## 2019-02-16 MED ORDER — DEXAMETHASONE 6 MG PO TABS: 6.0000 mg | ORAL_TABLET | Freq: Every day | ORAL | 0 refills | Status: DC

## 2019-02-16 MED ORDER — ASCORBIC ACID 500 MG PO TABS: 500.0000 mg | ORAL_TABLET | Freq: Every day | ORAL | 0 refills | Status: DC

## 2019-02-16 NOTE — Care Management Important Message (Signed)
Important Message  Patient Details IM Letter given to [place] Case Manager to present to the Patient Name: Eric Richmond MRN: [id number] Date of Birth: Jun 10, 1940   Medicare Important Message Given:  Yes     Kerin Salen 02/16/2019, 12:29 PM

## 2019-02-16 NOTE — Evaluation (Signed)
Occupational Therapy Evaluation Patient Details Name: Eric Richmond MRN: [id number] DOB: [birth year] Today's Date: 02/16/2019    History of Present Illness 78 year old male with history of HTN, DM-2, OSA, CKD-3, osteoarthritis, dementia and GERD presenting with altered mental status, fever, dyspnea on exertion, weakness and intermittent cough and admitted for pneumonia due to COVID-19 infection and acute metabolic encephalopathy.   Clinical Impression   OT eval complete.  Pts wife will A as needed.  No DME needs    Follow Up Recommendations  No OT follow up    Equipment Recommendations  None recommended by OT    Recommendations for Other Services       Precautions / Restrictions Precautions Precautions: Fall      Mobility Bed Mobility            pt [id number]      Transfers      pt overall S- mod I                    ADL either performed or assessed with clinical judgement   ADL Overall ADL's : Needs assistance/impaired                                       General ADL Comments: Pt overall S with ADL activity.  Wife wil A pt as needed at home.  Pt performed fxal mobility in the room as well as the bathroom.     Vision Patient Visual Report: No change from baseline              Pertinent Vitals/Pain Pain Assessment: No/denies pain     Hand Dominance     Extremity/Trunk Assessment Upper Extremity Assessment Upper Extremity Assessment: Generalized weakness           Communication     Cognition Arousal/Alertness: Awake/alert Behavior During Therapy: WFL for tasks assessed/performed Overall Cognitive Status: No family/caregiver present to determine baseline cognitive functioning                                                Home Living Family/patient expects to be discharged to:: Private residence Living Arrangements: Spouse/significant other Available Help at Discharge: Family Type of Home:  House Home Access: Stairs to enter     Home Layout: One level     Bathroom Shower/Tub: Aeronautical engineer: None          Prior Functioning/Environment Level of Independence: Independent                          OT Goals(Current goals can be found in the care plan section) Acute Rehab OT Goals Patient Stated Goal: home today OT Goal Formulation: With patient Potential to Achieve Goals: Good  OT Frequency:      AM-PAC OT "6 Clicks" Daily Activity     Outcome Measure Help from another person eating meals?: None Help from another person taking care of personal grooming?: None Help from another person toileting, which includes using toliet, bedpan, or urinal?: A Little Help from another person bathing (including washing, rinsing, drying)?: None Help from another person to put on and taking off regular upper body clothing?: None Help from  another person to put on and taking off regular lower body clothing?: None 6 Click Score: 23   End of Session Nurse Communication: Mobility status  Activity Tolerance: Patient tolerated treatment well Patient left: in chair;with call bell/phone within reach;with nursing/sitter in room                   Time: 1203-1217 OT Time Calculation (min): 14 min Charges:  OT General Charges $OT Visit: 1 Visit OT Evaluation $OT Eval Low Complexity: [place], OT Acute Rehabilitation Services [phone] Office- [phone]     Paytan Recine, Edwena Felty D 02/16/2019, 12:55 PM

## 2019-02-16 NOTE — [place] Notes (Signed)
Pt is more confused and uncooperative at this time. Attempted wrist restraints per order but pt very agitated with them on. Mid level notified. Ordered to apply waist restraint belt instead. Belt was applied. Will continue to monitor pt.

## 2019-02-16 NOTE — Plan of Care (Signed)
  Problem: Education: Goal: Knowledge of General Education information will improve Description: Including pain rating scale, medication(s)/side effects and non-pharmacologic comfort measures Outcome: Adequate for Discharge   Problem: Health Behavior/Discharge Planning: Goal: Ability to manage health-related needs will improve Outcome: Adequate for Discharge   Problem: Clinical Measurements: Goal: Ability to maintain clinical measurements within normal limits will improve Outcome: Adequate for Discharge Goal: Will remain free from infection Outcome: Adequate for Discharge Goal: Diagnostic test results will improve Outcome: Adequate for Discharge Goal: Respiratory complications will improve Outcome: Adequate for Discharge Goal: Cardiovascular complication will be avoided Outcome: Adequate for Discharge   Problem: Activity: Goal: Risk for activity intolerance will decrease Outcome: Adequate for Discharge   Problem: Nutrition: Goal: Adequate nutrition will be maintained Outcome: Adequate for Discharge   Problem: Coping: Goal: Level of anxiety will decrease Outcome: Adequate for Discharge   Problem: Elimination: Goal: Will not experience complications related to bowel motility Outcome: Adequate for Discharge Goal: Will not experience complications related to urinary retention Outcome: Adequate for Discharge   Problem: Pain Managment: Goal: General experience of comfort will improve Outcome: Adequate for Discharge   Problem: Safety: Goal: Ability to remain free from injury will improve Outcome: Adequate for Discharge   

## 2019-02-16 NOTE — [place] Notes (Signed)
atient scheduled for outpatient Remdesivir infusion at 0830AM on Monday 12/22.  Please advise them to report to [facility] at [address]  Drive to the security guard and tell them you are here for an infusion. They will direct you to the front entrance where we will come and get you.  For questions call [phone].  Thanks

## 2019-02-16 NOTE — Discharge Instructions (Signed)
You are scheduled for an outpatient infusion of Remdesivir at 0830AM on Tuesday 12/22.  Please report to [facility] at [address]  Drive to the security guard and tell them you are here for an infusion. They will direct you to the front entrance where we will come and get you.  For questions call [phone].  Thanks

## 2019-02-16 NOTE — Discharge Summary (Signed)
Physician Discharge Summary  VALERIO MATSUI [id number] DOB: Jan 21, 1941 DOA: 02/12/2019  PCP: [name], MD  Admit date: 02/12/2019 Discharge date: 02/16/2019  Admitted From: home Disposition:  home  Recommendations for Outpatient Follow-up:  1. Follow up with PCP in 1-2 weeks 2. Patient to get last remdesivir dose tomorrow 12/22 at [facility] 3. Continue steroid taper  Home Health: none Equipment/Devices: none  Discharge Condition: stable CODE STATUS: Full code Diet recommendation: regular  HPI: Per admitting MD, Eric Richmond is a 78 y.o. male with medical history significant of hypertension, diabetes mellitus without complication, GERD and mild neurocognitive disorder presented to ED for evaluation of worsening confusion.  Patient is pleasantly confused and unable to answer most of the questions appropriately therefore history is gathered from ED physician.  Patient was brought to the ED by EMS and as per EMS patient had a fever of 103 and was given 1000 mg of Tylenol on the way to the hospital.  Patient was diagnosed positive for COVID-19 2 days ago and he was asymptomatic and discharged home from the ED but after reaching home his condition continue to be worse as per patient's wife.  Patient started having worsening confusion and has been wandering around the house and doing the things which he was not doing at his baseline.  Patient was also having shortness of breath with exertion weakness and episodic cough.  During my evaluation, patient is having mild cough and shortness of breath with coughing but saturating well at room air.  Although the patient is very confused but he is able to follow simple verbal commands and denies headache, dizziness, chest pain, difficulty with breathing, nausea, vomiting, abdominal pain and urinary symptoms. ED Course: On arrival to the ED patient was confused and his blood pressure was 117/70, heart rate 65, temperature 99.1, respiratory rate 14  and oxygen saturation of 96% on room air.  X-ray chest was positive for bilateral basal opacities and was explaining the picture of Covid infection.  Hospital Course / Discharge diagnoses: Acute hypoxic respiratory failure due to Covid 19 - patient was admitted to the hospital and started on remdesivir and steroids, with significant improvement in his respiratory status and was able to be weaned off to room air. His Inflammatory markers are improving, and is stable for home d/c and will complete remdesivir at [facility] infusion center as an outpatient. He will also finish his steroids as an outpatient.   COVID-19 Labs  Recent Labs    02/14/19 0303 02/15/19 0307 02/16/19 0311  DDIMER 0.67* 0.59* 0.58*  FERRITIN 438* 396* 443*  LDH 242* 235* 247*  CRP 7.2* 4.0* 3.8*    No results found for: SARSCOV2NAA Coag negative staph 1/2 bottles -Likely contaminant Acute metabolic encephalopathy with underlying dementia -Worsened in the setting of COVID-19, wife would like to take him home as it is likely that patient being in familiar environment will help with his disorentation Hyponatremia -resolved Essential hypertension -Continue home medications History of OSA on CPAP -resume Cpap Acute kidney injury -Has normalized Thrombocytopenia -Platelets are normal  Discharge Instructions   Allergies as of 02/16/2019      Reactions   Oxycodone    Hallucinations    Tramadol    Hallucinations       Medication List    TAKE these medications   albuterol 108 (90 Base) MCG/ACT inhaler Commonly known as: Ventolin HFA Inhale 2 puffs into the lungs every 6 (six) hours as needed for wheezing or  shortness of breath.   amLODipine 5 MG tablet Commonly known as: NORVASC Take 1 tablet (5 mg total) by mouth daily.   ascorbic acid 500 MG tablet Commonly known as: VITAMIN C Take 1 tablet (500 mg total) by mouth daily. Start taking on: February 17, 2019   aspirin EC 81 MG tablet Take 81 mg by  mouth daily.   atenolol 50 MG tablet Commonly known as: TENORMIN TAKE 1 TABLET EVERY DAY (NEED MD APPOINTMENT FOR REFILLS) What changed: See the new instructions.   dexamethasone 6 MG tablet Commonly known as: DECADRON Take 1 tablet (6 mg total) by mouth daily.   fluticasone 50 MCG/ACT nasal spray Commonly known as: FLONASE Place 2 sprays into both nostrils daily. What changed:   when to take this  reasons to take this   guaiFENesin-dextromethorphan 100-10 MG/5ML syrup Commonly known as: ROBITUSSIN DM Take 10 mLs by mouth every 4 (four) hours as needed for cough.   losartan-hydrochlorothiazide 100-25 MG tablet Commonly known as: HYZAAR Take 1 tablet by mouth every morning.   memantine 10 MG tablet Commonly known as: NAMENDA Take 1 tablet (10 mg total) by mouth 2 (two) times daily.   multivitamin with minerals Tabs tablet Take 1 tablet by mouth daily.   omeprazole 40 MG capsule Commonly known as: PRILOSEC TAKE 1 CAPSULE EVERY DAY   zinc sulfate 220 (50 Zn) MG capsule Take 1 capsule (220 mg total) by mouth daily. Start taking on: February 17, 2019      Follow-up Information    [name], MD. Schedule an appointment as soon as possible for a visit in 2 week(s).   Specialty: Family Medicine Contact information: [place] [address] [phone]           Consultations:  None  Procedures/Studies:  DG Chest Port 1 View  Result Date: 02/12/2019 CLINICAL DATA:  Fever, weakness EXAM: PORTABLE CHEST 1 VIEW COMPARISON:  Radiograph 02/10/2019 FINDINGS: Low lung volumes with streaky bandlike opacities in lung bases favoring atelectatic change. More patchy opacities present in the periphery of the left lung and right infrahilar lung. No convincing features of edema. No pneumothorax or effusion. Cardiomediastinal contours are similar to prior accounting for low volumes and portable technique. No acute osseous or soft tissue abnormality.  Degenerative changes are present in the imaged spine and shoulders. Metallic necklace at the base of the neck. IMPRESSION: 1. Low lung volumes with streaky bandlike opacities in the lung bases favoring atelectatic change. 2. More patchy opacities in the lung bases and left lung periphery may reflect additional atelectatic change or early infection. 3. No pneumothorax or effusion. Electronically Signed   By: Lovena Le M.D.   On: 02/12/2019 21:23   DG Chest Port 1 View  Result Date: 02/10/2019 CLINICAL DATA:  Cough EXAM: PORTABLE CHEST 1 VIEW COMPARISON:  11/07/2013 FINDINGS: The heart size and mediastinal contours are within normal limits. Low lung volumes. Both lungs are clear. The visualized skeletal structures are unremarkable. IMPRESSION: No active disease. Electronically Signed   By: Kathreen Devoid   On: 02/10/2019 10:58      Subjective: - no chest pain, shortness of breath, no abdominal pain, nausea or vomiting.   Discharge Exam: BP 138/78 (BP Location: Right Arm)   Pulse (!) 59   Temp 97.7 F (36.5 C) (Oral)   Resp 20   Ht 5\' 8"  (1.727 m)   Wt 81.8 kg   SpO2 98%   BMI 27.41 kg/m  General: Pt is alert, awake, not in acute distress. Confused Cardiovascular: RRR, S1/S2 +, no rubs, no gallops Respiratory: CTA bilaterally, no wheezing, no rhonchi Abdominal: Soft, NT, ND, bowel sounds +   The results of significant diagnostics from this hospitalization (including imaging, microbiology, ancillary and laboratory) are listed below for reference.     Microbiology: Recent Results (from the past 240 hour(s))  Blood Culture (routine x 2)     Status: None (Preliminary result)   Collection Time: 02/12/19  8:51 PM   Specimen: BLOOD  Result Value Ref Range Status   Specimen Description   Final    BLOOD LEFT ANTECUBITAL Performed at [facility] [address] [place], [place] [address]    Special Requests   Final    BOTTLES DRAWN AEROBIC AND ANAEROBIC Blood  Culture results may not be optimal due to an excessive volume of blood received in culture bottles Performed at [facility] [address] [place], [place] [address]    Culture   Final    NO GROWTH 3 DAYS Performed at [facility], [place] [address], [place], [place] [address]    Report Status PENDING  Incomplete  Blood Culture ID Panel (Reflexed)     Status: None   Collection Time: 02/12/19  8:51 PM  Result Value Ref Range Status   Enterococcus species NOT DETECTED NOT DETECTED Final   Listeria monocytogenes NOT DETECTED NOT DETECTED Final   Staphylococcus species NOT DETECTED NOT DETECTED Final   Staphylococcus aureus (BCID) NOT DETECTED NOT DETECTED Final   Streptococcus species NOT DETECTED NOT DETECTED Final   Streptococcus agalactiae NOT DETECTED NOT DETECTED Final   Streptococcus pneumoniae NOT DETECTED NOT DETECTED Final   Streptococcus pyogenes NOT DETECTED NOT DETECTED Final   Acinetobacter baumannii NOT DETECTED NOT DETECTED Final   Enterobacteriaceae species NOT DETECTED NOT DETECTED Final   Enterobacter cloacae complex NOT DETECTED NOT DETECTED Final   Escherichia coli NOT DETECTED NOT DETECTED Final   Klebsiella oxytoca NOT DETECTED NOT DETECTED Final   Klebsiella pneumoniae NOT DETECTED NOT DETECTED Final   Proteus species NOT DETECTED NOT DETECTED Final   Serratia marcescens NOT DETECTED NOT DETECTED Final   Haemophilus influenzae NOT DETECTED NOT DETECTED Final   Neisseria meningitidis NOT DETECTED NOT DETECTED Final   Pseudomonas aeruginosa NOT DETECTED NOT DETECTED Final   Candida albicans NOT DETECTED NOT DETECTED Final   Candida glabrata NOT DETECTED NOT DETECTED Final   Candida krusei NOT DETECTED NOT DETECTED Final   Candida parapsilosis NOT DETECTED NOT DETECTED Final   Candida tropicalis NOT DETECTED NOT DETECTED Final    Comment: Performed at [facility], [place] [address] [place], [place] [address]  Blood Culture (routine x  2)     Status: Abnormal   Collection Time: 02/12/19  8:55 PM   Specimen: BLOOD  Result Value Ref Range Status   Specimen Description   Final    BLOOD RIGHT HAND Performed at [facility] [address] [place], [place] [address]    Special Requests   Final    BOTTLES DRAWN AEROBIC AND ANAEROBIC Blood Culture adequate volume Performed at [facility] [address] [place], [place] [address]    Culture  Setup Time   Final    ANAEROBIC BOTTLE ONLY GRAM POSITIVE COCCI AEROBIC BOTTLE ONLY GRAM VARIABLE ROD CRITICAL RESULT CALLED TO, READ BACK BY AND VERIFIED WITH: Lavell [facility] 02/13/19 2127  Culture (A)  Final    STAPHYLOCOCCUS SPECIES (COAGULASE NEGATIVE) THE SIGNIFICANCE OF ISOLATING THIS ORGANISM FROM A SINGLE SET OF BLOOD CULTURES WHEN MULTIPLE SETS ARE DRAWN IS UNCERTAIN. PLEASE NOTIFY THE MICROBIOLOGY DEPARTMENT WITHIN ONE WEEK IF SPECIATION AND SENSITIVITIES ARE REQUIRED. BACILLUS SPECIES Standardized susceptibility testing for this organism is not available. Performed at [facility], [place] [address] [place], [place] [address]    Report Status 02/14/2019 FINAL  Final  Blood Culture ID Panel (Reflexed)     Status: Abnormal   Collection Time: 02/12/19  8:55 PM  Result Value Ref Range Status   Enterococcus species NOT DETECTED NOT DETECTED Final   Listeria monocytogenes NOT DETECTED NOT DETECTED Final   Staphylococcus species DETECTED (A) NOT DETECTED Final    Comment: Methicillin (oxacillin) susceptible coagulase negative staphylococcus. Possible blood culture contaminant (unless isolated from more than one blood culture draw or clinical case suggests pathogenicity). No antibiotic treatment is indicated for blood  culture contaminants. CRITICAL RESULT CALLED TO, READ BACK BY AND VERIFIED WITH: Lavell [facility] 02/13/19 2127 JDW    Staphylococcus aureus (BCID) NOT DETECTED NOT DETECTED Final   Methicillin resistance NOT  DETECTED NOT DETECTED Final   Streptococcus species NOT DETECTED NOT DETECTED Final   Streptococcus agalactiae NOT DETECTED NOT DETECTED Final   Streptococcus pneumoniae NOT DETECTED NOT DETECTED Final   Streptococcus pyogenes NOT DETECTED NOT DETECTED Final   Acinetobacter baumannii NOT DETECTED NOT DETECTED Final   Enterobacteriaceae species NOT DETECTED NOT DETECTED Final   Enterobacter cloacae complex NOT DETECTED NOT DETECTED Final   Escherichia coli NOT DETECTED NOT DETECTED Final   Klebsiella oxytoca NOT DETECTED NOT DETECTED Final   Klebsiella pneumoniae NOT DETECTED NOT DETECTED Final   Proteus species NOT DETECTED NOT DETECTED Final   Serratia marcescens NOT DETECTED NOT DETECTED Final   Haemophilus influenzae NOT DETECTED NOT DETECTED Final   Neisseria meningitidis NOT DETECTED NOT DETECTED Final   Pseudomonas aeruginosa NOT DETECTED NOT DETECTED Final   Candida albicans NOT DETECTED NOT DETECTED Final   Candida glabrata NOT DETECTED NOT DETECTED Final   Candida krusei NOT DETECTED NOT DETECTED Final   Candida parapsilosis NOT DETECTED NOT DETECTED Final   Candida tropicalis NOT DETECTED NOT DETECTED Final    Comment: Performed at [facility], [place]. [address], [place], [place] [address]     Labs: Basic Metabolic Panel: Recent Labs  Lab 02/10/19 1033 02/12/19 2051 02/13/19 0242 02/14/19 0303 02/15/19 0307 02/16/19 0311  NA 133* 129*  --  133* 135 135  K 3.5 3.6  --  3.8 4.6 4.2  CL 97* 96*  --  99 101 102  CO2 25 22  --  24 25 22   GLUCOSE 126* 148*  --  145* 141* 166*  BUN 25* 25*  --  29* 30* 30*  CREATININE 1.22 1.20 1.17 0.97 0.96 0.94  CALCIUM 8.5* 8.0*  --  8.9 8.8* 8.9   Liver Function Tests: Recent Labs  Lab 02/12/19 2051 02/14/19 0303 02/15/19 0307 02/16/19 0311  AST 34 31 28 33  ALT 19 18 18 22   ALKPHOS 55 57 54 57  BILITOT 0.6 0.8 0.8 1.0  PROT 6.3* 6.7 6.6 7.0  ALBUMIN 3.1* 3.4* 3.1* 3.6   CBC: Recent Labs  Lab 02/12/19 2051  02/13/19 0242 02/14/19 0303 02/15/19 0307 02/16/19 0311  WBC 4.9 5.2 6.7 8.6 8.9  NEUTROABS 4.1  --  5.3 7.7 8.1*  HGB 12.1* 12.4* 13.0 12.9* 13.5  HCT 34.9* 37.0* 37.4* 37.6* 39.1  MCV 85.7 87.5 85.0 85.3 85.6  PLT 115* 108* 158 201 268   CBG: No results for input(s): GLUCAP in the last 168 hours. Hgb A1c No results for input(s): HGBA1C in the last 72 hours. Lipid Profile No results for input(s): CHOL, HDL, LDLCALC, TRIG, CHOLHDL, LDLDIRECT in the last 72 hours. Thyroid function studies No results for input(s): TSH, T4TOTAL, T3FREE, THYROIDAB in the last 72 hours.  Invalid input(s): FREET3 Urinalysis    Component Value Date/Time   COLORURINE AMBER (A) 11/07/2013 1059   APPEARANCEUR CLOUDY (A) 11/07/2013 1059   LABSPEC 1.025 11/07/2013 1059   PHURINE 6.0 11/07/2013 1059   GLUCOSEU NEGATIVE 11/07/2013 1059   HGBUR TRACE (A) 11/07/2013 1059   HGBUR 1+ 11/21/2009 0951   BILIRUBINUR - 12/15/2018 1611   KETONESUR NEGATIVE 11/07/2013 1059   PROTEINUR Negative 12/15/2018 1611   PROTEINUR NEGATIVE 11/07/2013 1059   UROBILINOGEN 0.2 12/15/2018 1611   UROBILINOGEN 1.0 11/07/2013 1059   NITRITE - 12/15/2018 1611   NITRITE NEGATIVE 11/07/2013 1059   LEUKOCYTESUR Negative 12/15/2018 1611    FURTHER DISCHARGE INSTRUCTIONS:   Get Medicines reviewed and adjusted: Please take all your medications with you for your next visit with your Primary MD   Laboratory/radiological data: Please request your Primary MD to go over all hospital tests and procedure/radiological results at the follow up, please ask your Primary MD to get all Hospital records sent to his/her office.   In some cases, they will be blood work, cultures and biopsy results pending at the time of your discharge. Please request that your primary care M.D. goes through all the records of your hospital data and follows up on these results.   Also Note the following: If you experience worsening of your admission symptoms,  develop shortness of breath, life threatening emergency, suicidal or homicidal thoughts you must seek medical attention immediately by calling 911 or calling your MD immediately  if symptoms less severe.   You must read complete instructions/literature along with all the possible adverse reactions/side effects for all the Medicines you take and that have been prescribed to you. Take any new Medicines after you have completely understood and accpet all the possible adverse reactions/side effects.    Do not drive when taking Pain medications or sleeping medications (Benzodaizepines)   Do not take more than prescribed Pain, Sleep and Anxiety Medications. It is not advisable to combine anxiety,sleep and pain medications without talking with your primary care practitioner   Special Instructions: If you have smoked or chewed Tobacco  in the last 2 yrs please stop smoking, stop any regular Alcohol  and or any Recreational drug use.   Wear Seat belts while driving.   Please note: You were cared for by a hospitalist during your hospital stay. Once you are discharged, your primary care physician will handle any further medical issues. Please note that NO REFILLS for any discharge medications will be authorized once you are discharged, as it is imperative that you return to your primary care physician (or establish a relationship with a primary care physician if you do not have one) for your post hospital discharge needs so that they can reassess your need for medications and monitor your lab values.  Time coordinating discharge: 40 minutes  SIGNED:  Marzetta Board, MD, PhD 02/16/2019, 11:40 AM

## 2019-02-17 ENCOUNTER — Telehealth: Payer: Self-pay | Admitting: Family Medicine

## 2019-02-17 ENCOUNTER — Other Ambulatory Visit: Payer: Self-pay | Admitting: *Deleted

## 2019-02-17 ENCOUNTER — Ambulatory Visit (HOSPITAL_COMMUNITY)
Admission: RE | Admit: 2019-02-17 | Discharge: 2019-02-17 | Disposition: A | Discharge status: Home / Self Care | Payer: Medicare HMO | Source: Ambulatory Visit | Attending: Pulmonary Disease | Admitting: Pulmonary Disease

## 2019-02-17 DIAGNOSIS — U071 COVID-19: Secondary | ICD-10-CM | POA: Diagnosis not present

## 2019-02-17 DIAGNOSIS — J1289 Other viral pneumonia: Secondary | ICD-10-CM | POA: Diagnosis not present

## 2019-02-17 MED ORDER — SODIUM CHLORIDE 0.9 % IV SOLN
100.0000 mg | Freq: Once | INTRAVENOUS | Status: AC
  Administered 2019-02-17: 100 mg via INTRAVENOUS

## 2019-02-17 MED ORDER — DIPHENHYDRAMINE HCL 50 MG/ML IJ SOLN: 50.0000 mg | Freq: Once | INTRAMUSCULAR | Status: DC | PRN

## 2019-02-17 MED ORDER — ALBUTEROL SULFATE HFA 108 (90 BASE) MCG/ACT IN AERS: 2.0000 | INHALATION_SPRAY | Freq: Once | RESPIRATORY_TRACT | Status: DC | PRN

## 2019-02-17 MED ORDER — EPINEPHRINE 0.3 MG/0.3ML IJ SOAJ: 0.3000 mg | Freq: Once | INTRAMUSCULAR | Status: DC | PRN

## 2019-02-17 MED ORDER — SODIUM CHLORIDE 0.9 % IV SOLN
INTRAVENOUS | Status: AC
  Filled 2019-02-17: qty 20

## 2019-02-17 MED ORDER — METHYLPREDNISOLONE SODIUM SUCC 125 MG IJ SOLR: 125.0000 mg | Freq: Once | INTRAMUSCULAR | Status: DC | PRN

## 2019-02-17 MED ORDER — FAMOTIDINE IN NACL 20-0.9 MG/50ML-% IV SOLN: 20.0000 mg | Freq: Once | INTRAVENOUS | Status: DC | PRN

## 2019-02-17 MED ORDER — SODIUM CHLORIDE 0.9 % IV SOLN
INTRAVENOUS | Status: DC | PRN
  Administered 2019-02-17: 09:00:00 250 mL via INTRAVENOUS

## 2019-02-17 NOTE — Discharge Instructions (Signed)
Prevent the Spread of COVID-19 if You Are Sick If you are sick with COVID-19 or think you might have COVID-19, follow the steps below to help protect other people in your home and community. Stay home except to get medical care.  Stay home. Most people with COVID-19 have mild illness and are able to recover at home without medical care. Do not leave your home, except to get medical care. Do not visit public areas.  Take care of yourself. Get rest and stay hydrated.  Get medical care when needed. Call your doctor before you go to their office for care. But, if you have trouble breathing or other concerning symptoms, call 911 for immediate help.  Avoid public transportation, ride-sharing, or taxis. Separate yourself from other people and pets in your home.  As much as possible, stay in a specific room and away from other people and pets in your home. Also, you should use a separate bathroom, if available. If you need to be around other people or animals in or outside of the home, wear a cloth face covering. ? See COVID-19 and Animals if you have questions about pets: https://www.cdc.gov/coronavirus/2019-ncov/faq.html#COVID19animals Monitor your symptoms.  Common symptoms of COVID-19 include fever and cough. Trouble breathing is a more serious symptom that means you should get medical attention.  Follow care instructions from your healthcare provider and local health department. Your local health authorities will give instructions on checking your symptoms and reporting information. If you develop emergency warning signs for COVID-19 get medical attention immediately.  Emergency warning signs include*:  Trouble breathing  Persistent pain or pressure in the chest  New confusion or not able to be woken  Bluish lips or face *This list is not all inclusive. Please consult your medical provider for any other symptoms that are severe or concerning to you. Call 911 if you have a medical  emergency. If you have a medical emergency and need to call 911, notify the operator that you have or think you might have, COVID-19. If possible, put on a facemask before medical help arrives. Call ahead before visiting your doctor.  Call ahead. Many medical visits for routine care are being postponed or done by phone or telemedicine.  If you have a medical appointment that cannot be postponed, call your doctor's office. This will help the office protect themselves and other patients. If you are sick, wear a cloth covering over your nose and mouth.  You should wear a cloth face covering over your nose and mouth if you must be around other people or animals, including pets (even at home).  You don't need to wear the cloth face covering if you are alone. If you can't put on a cloth face covering (because of trouble breathing for example), cover your coughs and sneezes in some other way. Try to stay at least 6 feet away from other people. This will help protect the people around you. Note: During the COVID-19 pandemic, medical grade facemasks are reserved for healthcare workers and some first responders. You may need to make a cloth face covering using a scarf or bandana. Cover your coughs and sneezes.  Cover your mouth and nose with a tissue when you cough or sneeze.  Throw used tissues in a lined trash can.  Immediately wash your hands with soap and water for at least 20 seconds. If soap and water are not available, clean your hands with an alcohol-based hand sanitizer that contains at least 60% alcohol. Clean your hands often.    Wash your hands often with soap and water for at least 20 seconds. This is especially important after blowing your nose, coughing, or sneezing; going to the bathroom; and before eating or preparing food.  Use hand sanitizer if soap and water are not available. Use an alcohol-based hand sanitizer with at least 60% alcohol, covering all surfaces of your hands and rubbing  them together until they feel dry.  Soap and water are the best option, especially if your hands are visibly dirty.  Avoid touching your eyes, nose, and mouth with unwashed hands. Avoid sharing personal household items.  Do not share dishes, drinking glasses, cups, eating utensils, towels, or bedding with other people in your home.  Wash these items thoroughly after using them with soap and water or put them in the dishwasher. Clean all "high-touch" surfaces everyday.  Clean and disinfect high-touch surfaces in your "sick room" and bathroom. Let someone else clean and disinfect surfaces in common areas, but not your bedroom and bathroom.  If a caregiver or other person needs to clean and disinfect a sick person's bedroom or bathroom, they should do so on an as-needed basis. The caregiver/other person should wear a mask and wait as long as possible after the sick person has used the bathroom. High-touch surfaces include phones, remote controls, counters, tabletops, doorknobs, bathroom fixtures, toilets, keyboards, tablets, and bedside tables.  Clean and disinfect areas that may have blood, stool, or body fluids on them.  Use household cleaners and disinfectants. Clean the area or item with soap and water or another detergent if it is dirty. Then use a household disinfectant. ? Be sure to follow the instructions on the label to ensure safe and effective use of the product. Many products recommend keeping the surface wet for several minutes to ensure germs are killed. Many also recommend precautions such as wearing gloves and making sure you have good ventilation during use of the product. ? Most EPA-registered household disinfectants should be effective. How to discontinue home isolation  People with COVID-19 who have stayed home (home isolated) can stop home isolation under the following conditions: ? If you will not have a test to determine if you are still contagious, you can leave home  after these three things have happened:  You have had no fever for at least 72 hours (that is three full days of no fever without the use of medicine that reduces fevers) AND  other symptoms have improved (for example, when your cough or shortness of breath has improved) AND  at least 10 days have passed since your symptoms first appeared. ? If you will be tested to determine if you are still contagious, you can leave home after these three things have happened:  You no longer have a fever (without the use of medicine that reduces fevers) AND  other symptoms have improved (for example, when your cough or shortness of breath has improved) AND  you received two negative tests in a row, 24 hours apart. Your doctor will follow CDC guidelines. In all cases, follow the guidance of your healthcare provider and local health department. The decision to stop home isolation should be made in consultation with your healthcare provider and state and local health departments. Local decisions depend on local circumstances. cdc.gov/coronavirus 06/29/2018 This information is not intended to replace advice given to you by your health care provider. Make sure you discuss any questions you have with your health care provider. Document Released: 06/10/2018 Document Revised: 07/09/2018 Document Reviewed: 06/10/2018   Elsevier Patient Education  2020 [employer] Inc[employer]  

## 2019-02-17 NOTE — Telephone Encounter (Signed)
Medication Refill - Medication: albuterol (VENTOLIN HFA) 108 (90 Base) MCG/ACT inhaler   Has the patient contacted their pharmacy? No. (Agent: If no, request that the patient contact the pharmacy for the refill.) (Agent: If yes, when and what did the pharmacy advise?)  Preferred Pharmacy (with phone number or street name):  [facility], [address] Phone:  [phone]  Fax:  [phone]       Agent: Please be advised that RX refills may take up to 3 business days. We ask that you follow-up with your pharmacy.

## 2019-02-17 NOTE — Progress Notes (Signed)
  Diagnosis: COVID-19  Physician: Dr. Joya Gaskins  Procedure: [facility] Med: remdesivir infusion.  Complications: No immediate complications noted.  Discharge: Discharged home   [facility] 02/17/2019

## 2019-02-17 NOTE — Telephone Encounter (Signed)
Transition Care Management Follow-up Telephone Call  Date of discharge and from where: 02/16/2019 from [facility]   How have you been since you were released from the hospital? "He's doing alright, he's still confused but that's a lot better since he came home".  Any questions or concerns? No   Items Reviewed:  Did the pt receive and understand the discharge instructions provided? Yes   Medications obtained and verified? Yes   Any new allergies since your discharge? No   Dietary orders reviewed? Yes  Do you have support at home? Yes   Other (ie: DME, Home Health, etc) no   Functional Questionnaire: (I = Independent and D = Dependent) ADL's: He is able to ambulate, feed himself, and take himself to the bathroom. His wife prepares his food and manages his medications. He wandered around a little bit last night but he settled down.  Bathing/Dressing-    Meal Prep-   Eating-   Maintaining continence-   Transferring/Ambulation-   Managing Meds-    Follow up appointments reviewed:    PCP Hospital f/u appt confirmed? Yes  Scheduled for telephone visit with [name] 02/26/19 10:45am.  [facility] f/u appt confirmed? No  Patient received last infusionof remdesivir today at [facility].  Are transportation arrangements needed? No   If their condition worsens, is the pt aware to call  their PCP or go to the ED? Yes  Was the patient provided with contact information for the PCP's office or ED? Yes  Was the pt encouraged to call back with questions or concerns? Yes

## 2019-02-17 NOTE — Telephone Encounter (Signed)
Rx sent to pharmacy by Dr. Cruzita Lederer, Vella Redhead

## 2019-02-17 NOTE — Telephone Encounter (Signed)
Rx sent to pharmacy by Dr. Cruzita Lederer, Vella Redhead on 02/17/2019.

## 2019-02-18 LAB — CULTURE, BLOOD (ROUTINE X 2): Culture: NO GROWTH

## 2019-02-19 ENCOUNTER — Telehealth: Payer: Self-pay | Admitting: Family Medicine

## 2019-02-19 NOTE — Telephone Encounter (Signed)
The patients wife was wanting to know what to give the patient since he hasn't had a bowel movement since 12/21 since he's been home. She gave him Murelax that hasn't worked so she was wanting to know what else she can give him.  Please Advise

## 2019-02-19 NOTE — Telephone Encounter (Signed)
Ok to give a bottle of magnesium citrate. This can be bought OTC.

## 2019-02-19 NOTE — Telephone Encounter (Signed)
Patients wife is aware. Nothing further needed.

## 2019-02-23 ENCOUNTER — Emergency Department (HOSPITAL_COMMUNITY): Payer: Medicare HMO

## 2019-02-23 ENCOUNTER — Encounter (HOSPITAL_COMMUNITY): Payer: Self-pay | Admitting: Emergency Medicine

## 2019-02-23 ENCOUNTER — Emergency Department (HOSPITAL_COMMUNITY)
Admission: EM | Admit: 2019-02-23 | Discharge: 2019-02-23 | Disposition: A | Discharge status: Home / Self Care | Payer: Medicare HMO | Attending: Emergency Medicine | Admitting: Emergency Medicine

## 2019-02-23 ENCOUNTER — Other Ambulatory Visit: Payer: Self-pay

## 2019-02-23 DIAGNOSIS — E119 Type 2 diabetes mellitus without complications: Secondary | ICD-10-CM | POA: Insufficient documentation

## 2019-02-23 DIAGNOSIS — J984 Other disorders of lung: Secondary | ICD-10-CM | POA: Diagnosis not present

## 2019-02-23 DIAGNOSIS — Z79899 Other long term (current) drug therapy: Secondary | ICD-10-CM | POA: Diagnosis not present

## 2019-02-23 DIAGNOSIS — R42 Dizziness and giddiness: Secondary | ICD-10-CM | POA: Insufficient documentation

## 2019-02-23 DIAGNOSIS — R000 Tachycardia, unspecified: Secondary | ICD-10-CM | POA: Diagnosis not present

## 2019-02-23 DIAGNOSIS — I951 Orthostatic hypotension: Secondary | ICD-10-CM | POA: Diagnosis not present

## 2019-02-23 DIAGNOSIS — E860 Dehydration: Secondary | ICD-10-CM | POA: Diagnosis not present

## 2019-02-23 DIAGNOSIS — Z7982 Long term (current) use of aspirin: Secondary | ICD-10-CM | POA: Diagnosis not present

## 2019-02-23 DIAGNOSIS — Z87891 Personal history of nicotine dependence: Secondary | ICD-10-CM | POA: Insufficient documentation

## 2019-02-23 DIAGNOSIS — R0902 Hypoxemia: Secondary | ICD-10-CM | POA: Diagnosis not present

## 2019-02-23 DIAGNOSIS — U071 COVID-19: Secondary | ICD-10-CM | POA: Diagnosis not present

## 2019-02-23 DIAGNOSIS — R404 Transient alteration of awareness: Secondary | ICD-10-CM | POA: Diagnosis not present

## 2019-02-23 LAB — CBC WITH DIFFERENTIAL/PLATELET
Abs Immature Granulocytes: 0.63 10*3/uL — ABNORMAL HIGH (ref 0.00–0.07)
Basophils Absolute: 0 10*3/uL (ref 0.0–0.1)
Basophils Relative: 0 %
Eosinophils Absolute: 0.1 10*3/uL (ref 0.0–0.5)
Eosinophils Relative: 1 %
HCT: 41.6 % (ref 39.0–52.0)
Hemoglobin: 14.5 g/dL (ref 13.0–17.0)
Immature Granulocytes: 4 %
Lymphocytes Relative: 16 %
Lymphs Abs: 2.6 10*3/uL (ref 0.7–4.0)
MCH: 29.7 pg (ref 26.0–34.0)
MCHC: 34.9 g/dL (ref 30.0–36.0)
MCV: 85.2 fL (ref 80.0–100.0)
Monocytes Absolute: 1 10*3/uL (ref 0.1–1.0)
Monocytes Relative: 6 %
Neutro Abs: 11.7 10*3/uL — ABNORMAL HIGH (ref 1.7–7.7)
Neutrophils Relative %: 73 %
Platelets: 347 10*3/uL (ref 150–400)
RBC: 4.88 MIL/uL (ref 4.22–5.81)
RDW: 12.9 % (ref 11.5–15.5)
WBC: 16.1 10*3/uL — ABNORMAL HIGH (ref 4.0–10.5)
nRBC: 0 % (ref 0.0–0.2)

## 2019-02-23 LAB — COMPREHENSIVE METABOLIC PANEL
ALT: 20 U/L (ref 0–44)
AST: 20 U/L (ref 15–41)
Albumin: 3.1 g/dL — ABNORMAL LOW (ref 3.5–5.0)
Alkaline Phosphatase: 60 U/L (ref 38–126)
Anion gap: 11 (ref 5–15)
BUN: 39 mg/dL — ABNORMAL HIGH (ref 8–23)
CO2: 22 mmol/L (ref 22–32)
Calcium: 9 mg/dL (ref 8.9–10.3)
Chloride: 98 mmol/L (ref 98–111)
Creatinine, Ser: 1.54 mg/dL — ABNORMAL HIGH (ref 0.61–1.24)
GFR calc Af Amer: 49 mL/min — ABNORMAL LOW (ref >60)
GFR calc non Af Amer: 43 mL/min — ABNORMAL LOW (ref >60)
Glucose, Bld: 133 mg/dL — ABNORMAL HIGH (ref 70–99)
Potassium: 4.3 mmol/L (ref 3.5–5.1)
Sodium: 131 mmol/L — ABNORMAL LOW (ref 135–145)
Total Bilirubin: 0.6 mg/dL (ref 0.3–1.2)
Total Protein: 6 g/dL — ABNORMAL LOW (ref 6.5–8.1)

## 2019-02-23 LAB — URINALYSIS, ROUTINE W REFLEX MICROSCOPIC
Bilirubin Urine: NEGATIVE
Glucose, UA: NEGATIVE mg/dL
Hgb urine dipstick: NEGATIVE
Ketones, ur: NEGATIVE mg/dL
Leukocytes,Ua: NEGATIVE
Nitrite: NEGATIVE
Protein, ur: NEGATIVE mg/dL
Specific Gravity, Urine: 1.018 (ref 1.005–1.030)
pH: 5 (ref 5.0–8.0)

## 2019-02-23 LAB — LACTIC ACID, PLASMA: Lactic Acid, Venous: 1.5 mmol/L (ref 0.5–1.9)

## 2019-02-23 MED ORDER — SODIUM CHLORIDE 0.9 % IV BOLUS (SEPSIS)
1000.0000 mL | Freq: Once | INTRAVENOUS | Status: AC
  Administered 2019-02-23: 1000 mL via INTRAVENOUS

## 2019-02-23 NOTE — ED Provider Notes (Addendum)
[facility] EMERGENCY DEPARTMENT Provider Note   CSN: [id number] Arrival date & time: 02/23/19  1810     History Chief Complaint  Patient presents with  . Hypotension    Julio L Busse is a 78 y.o. male.  Patient is a 78 year old male with past medical history of diabetes, hypertension, GERD, sleep apnea presenting to the emergency department for hypotension and lightheadedness.  Patient does have dementia and so most of the HPI was obtained from his wife who lives with him.  Patient was diagnosed with COVID-19 on the 15th of this month and was admitted and then discharged about a week ago.  Wife states that his Covid symptoms have resolved and he had a very good day today.  Reports that they both went out for a drive but when they came home he stated he was dizzy and she checked his blood pressure and it was 70 systolically.  She does report that prior to Covid his blood pressure medication was increased because his hypertension was out of control.  She reports that he has taken all of his blood pressure medication today.  Denies any fever, chills, nausea, vomiting, syncope, chest pain, shortness of breath.  Reports that he was eating and drinking normally.        Past Medical History:  Diagnosis Date  . Allergy   . Anal fissure 20 years ago  . Diabetes mellitus without complication ([place]) 0000000  . ED (erectile dysfunction)   . Essential hypertension 10/22/2006   Qualifier: Diagnosis of  By: Sarajane Jews MD, Ishmael Holter   . GERD (gastroesophageal reflux disease)   . Hard of hearing   . 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 OSA (obstructive sleep apnea)    per Dr. Gwenette Greet , cpap -2.0  . Osteoarthritis    Right knee and right thumb; s/p total knee replacement  . Pneumonia 2015    Patient Active Problem List   Diagnosis Date Noted  . Respiratory tract infection due to COVID-19 virus 02/13/2019  . Acute confusion 02/13/2019  . Mild neurocognitive disorder  01/06/2019  . Environmental and seasonal allergies 05/20/2018  . Postoperative stiffness of total knee replacement ([place]) 01/12/2014  . OA (osteoarthritis) of knee 10/26/2013  . Diabetes mellitus without complication ([place]) 123XX123  . OSA (obstructive sleep apnea) 07/21/2010  . Essential hypertension 10/22/2006  . GERD (gastroesophageal reflux disease) 10/22/2006    Past Surgical History:  Procedure Laterality Date  . ANAL FISSURE REPAIR    . colonoscopy  08/07/2016   per Dr. Hilarie Fredrickson, adenomatous polyps removed, repeat in 3 years   . KNEE ARTHROSCOPY     left knee  . KNEE CLOSED REDUCTION Left 01/13/2014   Procedure: CLOSED MANIPULATION LEFT KNEE;  Surgeon: Gearlean Alf, MD;  Location: [facility] ORS;  Service: Orthopedics;  Laterality: Left;  . SURGERY AGE 78 OR 7 ON LEFT KNEE FOR A "GLAND BEHIND THE KNEE"    . TOTAL KNEE ARTHROPLASTY Left 10/26/2013   Procedure: LEFT TOTAL KNEE ARTHROPLASTY;  Surgeon: Gearlean Alf, MD;  Location: [facility] OR[facility];  Service: Orthopedics;  Laterality: Left;       Family History  Problem Relation Age of Onset  . Breast cancer Sister   . Diabetes Sister   . Hypertension Sister   . Thyroid cancer Sister   . Diabetes Mother   . Hypertension Mother   . Diabetes Father   . Hypertension Father   . Diabetes Sister   . Hypertension Other   .  Diabetes Brother   . Hypertension Brother   . Heart failure Brother   . Heart failure Brother   . Diabetes Sister   . Colon cancer Neg Hx   . Esophageal cancer Neg Hx   . Rectal cancer Neg Hx   . Stomach cancer Neg Hx     Social History   Tobacco Use  . Smoking status: Former Smoker    Packs/day: 0.30    Years: 20.00    Pack years: 6.00    Types: Cigarettes    Quit date: 02/26/1977    Years since quitting: 42.0  . Smokeless tobacco: Former Systems developer    Types: Chew  Substance Use Topics  . Alcohol use: No    Alcohol/week: 0.0 standard drinks  . Drug use: No    Home Medications Prior to Admission medications     Medication Sig Start Date End Date Taking? Authorizing Provider  albuterol (VENTOLIN HFA) 108 (90 Base) MCG/ACT inhaler Inhale 2 puffs into the lungs every 6 (six) hours as needed for wheezing or shortness of breath. 02/11/18   [name], MD  amLODipine (NORVASC) 5 MG tablet Take 1 tablet (5 mg total) by mouth daily. 01/05/19   [name], MD  ascorbic acid (VITAMIN C) 500 MG tablet Take 1 tablet (500 mg total) by mouth daily. 02/17/19   Caren Griffins, MD  aspirin EC 81 MG tablet Take 81 mg by mouth daily.    [provider]  atenolol (TENORMIN) 50 MG tablet TAKE 1 TABLET EVERY DAY (NEED MD APPOINTMENT FOR REFILLS) Patient taking differently: Take 50 mg by mouth daily.  07/09/18   [name], MD  dexamethasone (DECADRON) 6 MG tablet Take 1 tablet (6 mg total) by mouth daily. Patient not taking: Reported on 02/23/2019 02/16/19   Caren Griffins, MD  fluticasone [facility]) 50 MCG/ACT nasal spray Place 2 sprays into both nostrils daily. Patient taking differently: Place 2 sprays into both nostrils daily as needed for allergies.  10/21/17   [name], MD  guaiFENesin-dextromethorphan (ROBITUSSIN DM) 100-10 MG/5ML syrup Take 10 mLs by mouth every 4 (four) hours as needed for cough. 02/16/19   Caren Griffins, MD  losartan-hydrochlorothiazide (HYZAAR) 100-25 MG tablet Take 1 tablet by mouth every morning. 12/29/18   [name], MD  memantine (NAMENDA) 10 MG tablet Take 1 tablet (10 mg total) by mouth 2 (two) times daily. 12/02/18   Cameron Sprang, MD  Multiple Vitamin (MULTIVITAMIN WITH MINERALS) TABS tablet Take 1 tablet by mouth daily.    [provider]  omeprazole (PRILOSEC) 40 MG capsule TAKE 1 CAPSULE EVERY DAY Patient taking differently: Take 40 mg by mouth daily.  07/09/18   [name], MD  zinc sulfate 220 (50 Zn) MG capsule Take 1 capsule (220 mg total) by mouth daily. 02/17/19   Caren Griffins, MD    Allergies    Oxycodone and  Tramadol  Review of Systems   Review of Systems  Unable to perform ROS: Dementia  Constitutional: Negative.   HENT: Negative.   Eyes: Negative for visual disturbance.  Respiratory: Negative for cough and shortness of breath.   Cardiovascular: Negative for chest pain.  Gastrointestinal: Negative for abdominal pain, diarrhea, nausea and vomiting.  Genitourinary: Negative for dysuria.  Musculoskeletal: Negative for arthralgias and myalgias.  Allergic/Immunologic: Negative for immunocompromised state.  Neurological: Positive for dizziness and light-headedness.    Physical Exam Updated Vital Signs BP 111/71 (BP Location: Right Arm)  Pulse (!) 48   Temp 97.7 F (36.5 C) (Oral)   Resp 13   SpO2 98%   Physical Exam Vitals and nursing note reviewed.  Constitutional:      General: He is not in acute distress.    Appearance: Normal appearance. He is not ill-appearing, toxic-appearing or diaphoretic.     Comments: Pleasant elderly male  HENT:     Head: Normocephalic.     Mouth/Throat:     Mouth: Mucous membranes are moist.  Eyes:     Conjunctiva/sclera: Conjunctivae normal.  Cardiovascular:     Rate and Rhythm: Regular rhythm. Bradycardia present.  Pulmonary:     Effort: Pulmonary effort is normal.     Breath sounds: Normal breath sounds.  Abdominal:     General: Abdomen is flat.  Skin:    General: Skin is dry.  Neurological:     General: No focal deficit present.     Mental Status: He is alert.     Comments: Patient has difficulty remember date including year. Has difficulty with short term memory  Psychiatric:        Mood and Affect: Mood normal.     ED Results / Procedures / Treatments   Labs (all labs ordered are listed, but only abnormal results are displayed) Labs Reviewed  CULTURE, BLOOD (ROUTINE X 2)  CULTURE, BLOOD (ROUTINE X 2)  LACTIC ACID, PLASMA  LACTIC ACID, PLASMA  COMPREHENSIVE METABOLIC PANEL  CBC WITH DIFFERENTIAL/PLATELET    EKG EKG  Interpretation  Date/Time:  Monday February 23 2019 18:52:06 EST Ventricular Rate:  47 PR Interval:    QRS Duration: 107 QT Interval:  470 QTC Calculation: 416 R Axis:   48 Text Interpretation: new Sinus bradycardia Confirmed by Blanchie Dessert [phone]) on 02/23/2019 6:57:23 PM   Radiology No results found.  Procedures Procedures (including critical care time)  Medications Ordered in ED Medications  sodium chloride 0.9 % bolus 1,000 mL (has no administration in time range)    ED Course  I have reviewed the triage vital signs and the nursing notes.  Pertinent labs & imaging results that were available during my care of the patient were reviewed by me and considered in my medical decision making (see chart for details).  Clinical Course as of Mar 02 2257  Mon Feb 23, 2019  1851 Patient recovering from recent admission with covid presenting with hypotension and dizziness just PTA. Recent increase in BP meds per wifes report. No cp, SOB, syncope and improved from previous covid sx. Previously bradycardic during recent admission as well and has rate of 48 on arrival here. NAD and appears well. Is taking atenolol.  Working up patient for possible causes of hypotension to include infections, medication, dehydration, cardiac issue.    [KM]  1902 Per my review of the patient's chart, patient had 5 mg of amlodipine added to his blood pressure regimen at the beginning of November due to elevated blood pressure readings.  However, patient has also had problems with low blood pressure readings in the past in the 123XX123 systolically as well as readings of bradycardia at home.  His atenolol was decreased in October to 25 mg.   [KM]  2109 I think the patient is likely having hypotension and dizziness due to dehydration. He improved with fluids. He did have a mild AKI and leukocytosis. However, there were no sources of infection and patient is afebrile. I have discussed the patient with Dr. Maryan Rued  and we agree on the plan  to stop patient's atenolol until he calls his PMD tomorrow. He appears very well and improved and stable for d/c. Spoke with Patient's wife who agrees with plan as well.    [KM]    Clinical Course User Index [KM] Kristine Royal   MDM Rules/Calculators/A&P                      Based on review of vitals, medical screening exam, lab work and/or imaging, there does not appear to be an acute, emergent etiology for the patient's symptoms. Counseled pt on good return precautions and encouraged both PCP and ED follow-up as needed.  Prior to discharge, I also discussed incidental imaging findings with patient in detail and advised appropriate, recommended follow-up in detail.  Clinical Impression: 1. Orthostatic hypotension   2. Dehydration     Disposition: Discharge  Prior to providing a prescription for a controlled substance, I independently reviewed the patient's recent prescription history on the [place]. The patient had no recent or regular prescriptions and was deemed appropriate for a brief, less than 3 day prescription of narcotic for acute analgesia.  This note was prepared with assistance of Systems analyst. Occasional wrong-word or sound-a-like substitutions may have occurred due to the inherent limitations of voice recognition software.  Final Clinical Impression(s) / ED Diagnoses Final diagnoses:  None    Rx / DC Orders ED Discharge Orders    None       Kristine Royal 03/03/19 2259    Blanchie Dessert, MD 03/06/19 2036    Alveria Apley, PA-C 03/18/19 [place], MD 03/18/19 1121

## 2019-02-23 NOTE — ED Notes (Signed)
Pt stands on own ability for [facility]. No complaints. Steady on feet.

## 2019-02-23 NOTE — ED Notes (Signed)
Discharge instructions discussed with pt and wife. Pt verbalized understanding. Pt stable and ambulatory. No signature pad available.

## 2019-02-23 NOTE — Patient Outreach (Signed)
[name]  02/23/2019   Eric Richmond 1940-03-30 [id number]  Subjective: Telephone call to wife [name] (DPR) for follow up EMMI.  She reports that patient doing better today. She states he still has forgetfulness from his dementia but it is better.  Addressed red alert and wife denies any problems with loss of interest in things with patient.    She is primary caregiver for patient.  She states that patient is independent with bathing and dressing but needs cueing.  She handles his medications and makes sure he takes them.  Discussed COVID 19 with her and precautions. She verbalized understanding and states they have a follow up with PCP this week via video visit.   Discussed [facility] services with wife and she is agreeable to nurse follow up calls.     Objective:   Encounter Medications:  Outpatient Encounter Medications as of 02/23/2019  Medication Sig  . albuterol (VENTOLIN HFA) 108 (90 Base) MCG/ACT inhaler Inhale 2 puffs into the lungs every 6 (six) hours as needed for wheezing or shortness of breath.  Marland Kitchen amLODipine (NORVASC) 5 MG tablet Take 1 tablet (5 mg total) by mouth daily.  Marland Kitchen ascorbic acid (VITAMIN C) 500 MG tablet Take 1 tablet (500 mg total) by mouth daily.  Marland Kitchen aspirin EC 81 MG tablet Take 81 mg by mouth daily.  Marland Kitchen atenolol (TENORMIN) 50 MG tablet TAKE 1 TABLET EVERY DAY (NEED MD APPOINTMENT FOR REFILLS) (Patient taking differently: Take 50 mg by mouth daily. )  . fluticasone (FLONASE) 50 MCG/ACT nasal spray Place 2 sprays into both nostrils daily. (Patient taking differently: Place 2 sprays into both nostrils daily as needed for allergies. )  . guaiFENesin-dextromethorphan (ROBITUSSIN DM) 100-10 MG/5ML syrup Take 10 mLs by mouth every 4 (four) hours as needed for cough.  . losartan-hydrochlorothiazide (HYZAAR) 100-25 MG tablet Take 1 tablet by mouth every morning.  . memantine (NAMENDA) 10 MG tablet Take 1 tablet (10 mg  total) by mouth 2 (two) times daily.  . Multiple Vitamin (MULTIVITAMIN WITH MINERALS) TABS tablet Take 1 tablet by mouth daily.  Marland Kitchen omeprazole (PRILOSEC) 40 MG capsule TAKE 1 CAPSULE EVERY DAY (Patient taking differently: Take 40 mg by mouth daily. )  . zinc sulfate 220 (50 Zn) MG capsule Take 1 capsule (220 mg total) by mouth daily.  Marland Kitchen dexamethasone (DECADRON) 6 MG tablet Take 1 tablet (6 mg total) by mouth daily. (Patient not taking: Reported on 02/23/2019)   No facility-administered encounter medications on file as of 02/23/2019.    Functional Status:  In your present state of health, do you have any difficulty performing the following activities: 02/23/2019 02/13/2019  Hearing? Tempie Donning  Comment has hearing aids -  Vision? N N  Difficulty concentrating or making decisions? N Y  Walking or climbing stairs? N N  Dressing or bathing? N N  Doing errands, shopping? Tempie Donning  Comment wife takes -  Conservation officer, nature and eating ? Y -  Using the Toilet? N -  In the past six months, have you accidently leaked urine? N -  Do you have problems with loss of bowel control? N -  Managing your Medications? Y -  Comment wife completes medications -  Managing your Finances? Y -  Comment Wife does -  Horticulturist, commercial your Housekeeping? Y -  Comment wife handles -  Some recent data might be hidden    Fall/Depression Screening: Fall Risk  12/02/2018 04/25/2018 08/13/2017  Falls in the past year? 0 0 No  Number falls in past yr: 0 0 -  Injury with Fall? 0 0 -  Follow up Falls evaluation completed - -   PHQ 2/9 Scores 08/13/2017 08/09/2016 11/18/2014  PHQ - 2 Score 0 1 0    Assessment: Patient recovering from [place] 19. Patient with history of hypertension.  HTN controlled by medications and wife monitors patient blood pressure. Patient also has history of Dementia with some increased forgetfulness thought to be related to his COVID which wife thinks is better/    Plan:  [facility] CM Care Plan Problem One      Most Recent Value  Care Plan Problem One  Recent Hospitalization related to [place] for Problem One  Active  [facility] Long Term Goal   Patient will not experience unplanned hospital readmission within the next 31 days  [facility] Long Term Goal Start Date  02/23/19  Interventions for Problem One Long Term Goal  Discussed with patient current clinical condition since hospital discharge,  confirmed that patient has all  medications and denies concerns around medications,  reviewed post-hospital discharge instructions with patient and confirmed that patient has reliable transportation to all scheduled provider appointments post-hospital discharge.  [facility] CM Short Term Goal #1   Patient will keep systolic blood pressure less than 150.  [facility] CM Short Term Goal #1 Start Date  02/23/19  Interventions for Short Term Goal #1  RN CM discussed blood pressure control with wife.  Wife monitors blood pressure at least weekly.  [facility] CM Short Term Goal #2   Patient will follow up with PCP within 7 days.  [facility] CM Short Term Goal #2 Start Date  02/23/19  Interventions for Short Term Goal #2  Discussed with wife importance of follow up with physician and importance of quarantine.     RN CM will provide ongoing education and support to caregiver/patient through phone calls.   RN CM will send welcome packet with consent to caregiver/patient.   RN CM will send initial barriers letter, assessment, and care plan to primary care physician.   RN CM will contact caregiver/patient next month and caregiver agrees to next contact.    Jone Baseman, RN, MSN [place] [facility] Care Management Coordinator Direct Line [phone] Cell [phone] Toll Free: [phone]  Fax: [phone]

## 2019-02-23 NOTE — ED Triage Notes (Signed)
Pt BIB [facility] from home, COVID+, pt c/o dizziness on standing starting this morning. EMS reports pt's BP normal while sitting, SBP 70 on standing. Hx dementia. No other complaints at this time.

## 2019-02-23 NOTE — Discharge Instructions (Addendum)
You were seen today for low blood pressure and dizziness. You appeared to be a little dehydrated with low heart rate. The remainder of your workup have been reassuring. You were given IV fluids which helped your blood pressure. I would like for you to STOP the atenolol until you talk with your primary care doctor, [name]. Thank you for allowing me to care for you today. Please return to the emergency department if you have new or worsening symptoms. Take your medications as instructed.

## 2019-02-23 NOTE — ED Notes (Signed)
Wife called to pick up pt. No answer.

## 2019-02-23 NOTE — ED Notes (Signed)
[name] [phone]

## 2019-02-24 ENCOUNTER — Telehealth: Payer: Self-pay | Admitting: Family Medicine

## 2019-02-24 NOTE — Telephone Encounter (Signed)
Pt just wanted to advise [name] that he was told by the hospital to not take anymore atenolol (TENORMIN) 50 MG tablet until he has seen [name]

## 2019-02-24 NOTE — Telephone Encounter (Signed)
Noted  

## 2019-02-25 ENCOUNTER — Encounter: Payer: Self-pay | Admitting: Family Medicine

## 2019-02-25 ENCOUNTER — Other Ambulatory Visit: Payer: Self-pay

## 2019-02-25 ENCOUNTER — Ambulatory Visit (INDEPENDENT_AMBULATORY_CARE_PROVIDER_SITE_OTHER): Payer: Medicare HMO | Admitting: Family Medicine

## 2019-02-25 DIAGNOSIS — G3184 Mild cognitive impairment, so stated: Secondary | ICD-10-CM

## 2019-02-25 DIAGNOSIS — U071 COVID-19: Secondary | ICD-10-CM | POA: Diagnosis not present

## 2019-02-25 DIAGNOSIS — I10 Essential (primary) hypertension: Secondary | ICD-10-CM | POA: Diagnosis not present

## 2019-02-25 DIAGNOSIS — J988 Other specified respiratory disorders: Secondary | ICD-10-CM | POA: Diagnosis not present

## 2019-02-25 NOTE — Progress Notes (Signed)
   Subjective:    Patient ID: Eric Richmond, male    DOB: 1940/12/03, 78 y.o.   MRN: [id number]  HPI Virtual Visit via Telephone Note  I connected with the patient on 02/25/19 at 10:45 AM EST by telephone and verified that I am speaking with the correct person using two identifiers.   I discussed the limitations, risks, security and privacy concerns of performing an evaluation and management service by telephone and the availability of in person appointments. I also discussed with the patient that there may be a patient responsible charge related to this service. The patient expressed understanding and agreed to proceed.  Location patient: home Location provider: work or home office Participants present for the call: patient, provider Patient did not have a visit in the prior 7 days to address this/these issue(s).   History of Present Illness: Here to follow up from a hospital stay from 02-12-19 to 02-16-19 for a Covid-19 infection. He was admitted with fever to 103 degres, confusion, cough, and SOB. Xrays revealed bilateral pulmonary opacities consistent with Covid pneumonia. He had mild respiratory failure but did not require intubation. He was treated with Remdesivir and steroids, and he recovered nicely. After going home he continue to take oral steroids and he had a few infusions of Remdesivir as an outpatient. Then he went to the ER on 02-23-19 for weakness and he was found to be dehydrated and hypotensive. He was taken off Atenolol, and since going back hom ehje has done well. His BP is stable in the 130s over 80s. He has a good appetite. He still gets confused but his cognition seems to be back to his baseline.   Observations/Objective: Patient sounds cheerful and well on the phone. I do not appreciate any SOB. Speech and thought processing are grossly intact. Patient reported vitals:  Assessment and Plan: He is recovering from a Covid-19 infection with pneumonia. His BP is  stable, so he will remain off Atenolol.  Alysia Penna, MD   Follow Up Instructions:     [phone] 5-10 [phone] 11-20 [phone] 21-30 I did not refer this patient for an OV in the next 24 hours for this/these issue(s).  I discussed the assessment and treatment plan with the patient. The patient was provided an opportunity to ask questions and all were answered. The patient agreed with the plan and demonstrated an understanding of the instructions.   The patient was advised to call back or seek an in-person evaluation if the symptoms worsen or if the condition fails to improve as anticipated.  I provided 25 minutes of non-face-to-face time during this encounter.   Alysia Penna, MD    Review of Systems     Objective:   Physical Exam        Assessment & Plan:

## 2019-02-28 LAB — CULTURE, BLOOD (ROUTINE X 2): Culture: NO GROWTH

## 2019-03-01 ENCOUNTER — Emergency Department (HOSPITAL_COMMUNITY)
Admission: EM | Admit: 2019-03-01 | Discharge: 2019-03-02 | Disposition: A | Discharge status: Home / Self Care | Payer: Medicare Other | Attending: Emergency Medicine | Admitting: Emergency Medicine

## 2019-03-01 ENCOUNTER — Emergency Department (HOSPITAL_COMMUNITY): Payer: Medicare Other

## 2019-03-01 ENCOUNTER — Encounter (HOSPITAL_COMMUNITY): Payer: Self-pay | Admitting: Emergency Medicine

## 2019-03-01 DIAGNOSIS — Z79899 Other long term (current) drug therapy: Secondary | ICD-10-CM | POA: Insufficient documentation

## 2019-03-01 DIAGNOSIS — Z87891 Personal history of nicotine dependence: Secondary | ICD-10-CM | POA: Insufficient documentation

## 2019-03-01 DIAGNOSIS — I959 Hypotension, unspecified: Secondary | ICD-10-CM | POA: Diagnosis not present

## 2019-03-01 DIAGNOSIS — E119 Type 2 diabetes mellitus without complications: Secondary | ICD-10-CM | POA: Insufficient documentation

## 2019-03-01 DIAGNOSIS — E871 Hypo-osmolality and hyponatremia: Secondary | ICD-10-CM | POA: Diagnosis not present

## 2019-03-01 DIAGNOSIS — I471 Supraventricular tachycardia: Secondary | ICD-10-CM | POA: Diagnosis not present

## 2019-03-01 DIAGNOSIS — I10 Essential (primary) hypertension: Secondary | ICD-10-CM | POA: Insufficient documentation

## 2019-03-01 DIAGNOSIS — Z7982 Long term (current) use of aspirin: Secondary | ICD-10-CM | POA: Diagnosis not present

## 2019-03-01 DIAGNOSIS — U071 COVID-19: Secondary | ICD-10-CM | POA: Diagnosis not present

## 2019-03-01 DIAGNOSIS — Z7984 Long term (current) use of oral hypoglycemic drugs: Secondary | ICD-10-CM | POA: Insufficient documentation

## 2019-03-01 DIAGNOSIS — Z96652 Presence of left artificial knee joint: Secondary | ICD-10-CM | POA: Insufficient documentation

## 2019-03-01 DIAGNOSIS — R000 Tachycardia, unspecified: Secondary | ICD-10-CM | POA: Diagnosis not present

## 2019-03-01 LAB — BASIC METABOLIC PANEL
Anion gap: 8 (ref 5–15)
BUN: 14 mg/dL (ref 8–23)
CO2: 22 mmol/L (ref 22–32)
Calcium: 8.8 mg/dL — ABNORMAL LOW (ref 8.9–10.3)
Chloride: 99 mmol/L (ref 98–111)
Creatinine, Ser: 0.93 mg/dL (ref 0.61–1.24)
GFR calc Af Amer: >60 mL/min (ref >60)
GFR calc non Af Amer: >60 mL/min (ref >60)
Glucose, Bld: 118 mg/dL — ABNORMAL HIGH (ref 70–99)
Potassium: 3.7 mmol/L (ref 3.5–5.1)
Sodium: 129 mmol/L — ABNORMAL LOW (ref 135–145)

## 2019-03-01 LAB — HEPATIC FUNCTION PANEL
ALT: 16 U/L (ref 0–44)
AST: 18 U/L (ref 15–41)
Albumin: 3.1 g/dL — ABNORMAL LOW (ref 3.5–5.0)
Alkaline Phosphatase: 64 U/L (ref 38–126)
Bilirubin, Direct: 0.1 mg/dL (ref 0.0–0.2)
Indirect Bilirubin: 0.4 mg/dL (ref 0.3–0.9)
Total Bilirubin: 0.5 mg/dL (ref 0.3–1.2)
Total Protein: 6.4 g/dL — ABNORMAL LOW (ref 6.5–8.1)

## 2019-03-01 LAB — CBC
HCT: 39.8 % (ref 39.0–52.0)
Hemoglobin: 13.7 g/dL (ref 13.0–17.0)
MCH: 29.7 pg (ref 26.0–34.0)
MCHC: 34.4 g/dL (ref 30.0–36.0)
MCV: 86.3 fL (ref 80.0–100.0)
Platelets: 159 10*3/uL (ref 150–400)
RBC: 4.61 MIL/uL (ref 4.22–5.81)
RDW: 13.2 % (ref 11.5–15.5)
WBC: 8.1 10*3/uL (ref 4.0–10.5)
nRBC: 0 % (ref 0.0–0.2)

## 2019-03-01 LAB — TROPONIN I (HIGH SENSITIVITY): Troponin I (High Sensitivity): 10 ng/L (ref <18)

## 2019-03-01 LAB — D-DIMER, QUANTITATIVE: D-Dimer, Quant: 1.02 ug/mL-FEU — ABNORMAL HIGH (ref 0.00–0.50)

## 2019-03-01 MED ORDER — SODIUM CHLORIDE 0.9% FLUSH: 3.0000 mL | Freq: Once | INTRAVENOUS | Status: DC

## 2019-03-01 MED ORDER — SODIUM CHLORIDE 0.9 % IV SOLN: Freq: Once | INTRAVENOUS | Status: DC

## 2019-03-01 NOTE — ED Triage Notes (Signed)
Pt transported from home by EMS for HR 140, per EMS pt denies dizziness or other s/s.  Pt is COVID +, dx 02-10-19

## 2019-03-01 NOTE — ED Provider Notes (Signed)
MSE was initiated and I personally evaluated the patient and placed orders (if any) at  10:21 PM on March 01, 2019.  The patient appears stable so that the remainder of the MSE may be completed by another provider.  Patient reports that he is not sure what his main symptoms are.  He states is about the same thing when he was here and admitted last week.  He denies that he is feeling poorly.  He denies he has any pain.  He is not perceive himself to be short of breath.  Reports he does have problems with his memory and recall.  Patient is alert and interactive.  He does not have any respiratory distress at rest.  Heart is tachycardic.  Lungs are grossly clear and appreciate significant wheeze or crackle.  Abdomen is soft without guarding.  No peripheral edema.  Skin is warm and dry.  Patient is significantly tachycardic with sinus rhythm narrow complex in the 130s.  Pressures are in the low 100s.  Have low suspicion for sepsis based on patient's clinical appearance.  Significant tachycardia, will initiate diagnostic lab work and chest x-ray and EKG.   Charlesetta Shanks, MD 03/01/19 2223

## 2019-03-01 NOTE — ED Notes (Signed)
Pts wife [name] Jax Kawamura is looking for an update on the patient [id number]

## 2019-03-02 LAB — RAPID URINE DRUG SCREEN, HOSP PERFORMED
Amphetamines: NOT DETECTED
Barbiturates: NOT DETECTED
Benzodiazepines: NOT DETECTED
Cocaine: NOT DETECTED
Opiates: NOT DETECTED
Tetrahydrocannabinol: NOT DETECTED

## 2019-03-02 LAB — URINALYSIS, ROUTINE W REFLEX MICROSCOPIC
Bilirubin Urine: NEGATIVE
Glucose, UA: NEGATIVE mg/dL
Hgb urine dipstick: NEGATIVE
Ketones, ur: NEGATIVE mg/dL
Leukocytes,Ua: NEGATIVE
Nitrite: NEGATIVE
Protein, ur: NEGATIVE mg/dL
Specific Gravity, Urine: 1.004 — ABNORMAL LOW (ref 1.005–1.030)
pH: 6 (ref 5.0–8.0)

## 2019-03-02 NOTE — ED Notes (Signed)
Pts wife updated with d/c instructions and follow up. Pt transported to family waiting in parking lot to take patient home.

## 2019-03-02 NOTE — ED Provider Notes (Addendum)
[facility] EMERGENCY DEPARTMENT Provider Note   CSN: [id number] Arrival date & time: 03/01/19  2141   History Chief Complaint  Patient presents with  . Tachycardia    Eric Richmond is a 79 y.o. male.  The history is provided by the patient.  He was brought in by ambulance because of an elevated heart rate.  He denies palpitations, chest pain, dyspnea.  He was diagnosed as Covid positive on December 15.  He has no complaints whatsoever currently.  Past Medical History:  Diagnosis Date  . Allergy   . Anal fissure 20 years ago  . Diabetes mellitus without complication ([place]) 0000000  . ED (erectile dysfunction)   . Essential hypertension 10/22/2006   Qualifier: Diagnosis of  By: Sarajane Jews MD, Ishmael Holter   . GERD (gastroesophageal reflux disease)   . Hard of hearing   . 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 OSA (obstructive sleep apnea)    per Dr. Gwenette Greet , cpap -2.0  . Osteoarthritis    Right knee and right thumb; s/p total knee replacement  . Pneumonia 2015    Patient Active Problem List   Diagnosis Date Noted  . Respiratory tract infection due to COVID-19 virus 02/13/2019  . Acute confusion 02/13/2019  . 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 Environmental and seasonal allergies 05/20/2018  . 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 OA (osteoarthritis) of knee 10/26/2013  . Diabetes mellitus without complication ([place]) [id number]  . OSA (obstructive sleep apnea) 07/21/2010  . Essential hypertension 10/22/2006  . GERD (gastroesophageal reflux disease) 10/22/2006    Past Surgical History:  Procedure Laterality Date  . ANAL FISSURE REPAIR    . colonoscopy  08/07/2016   per Dr. Hilarie Fredrickson, adenomatous polyps removed, repeat in 3 years   . KNEE ARTHROSCOPY     left knee  . KNEE CLOSED REDUCTION Left 01/13/2014   Procedure: CLOSED MANIPULATION LEFT KNEE;  Surgeon: Gearlean Alf, MD;  Location: [facility] ORS;  Service:  Orthopedics;  Laterality: Left;  . SURGERY AGE 59 OR 7 ON LEFT KNEE FOR A "GLAND BEHIND THE KNEE"    . TOTAL KNEE ARTHROPLASTY Left 10/26/2013   Procedure: LEFT TOTAL KNEE ARTHROPLASTY;  Surgeon: Gearlean Alf, MD;  Location: [facility] ORS;  Service: Orthopedics;  Laterality: Left;       Family History  Problem Relation Age of Onset  . Breast cancer Sister   . Diabetes Sister   . Hypertension Sister   . Thyroid cancer Sister   . Diabetes Mother   . Hypertension Mother   . Diabetes Father   . Hypertension Father   . Diabetes Sister   . Hypertension Other   . Diabetes Brother   . Hypertension Brother   . Heart failure Brother   . Heart failure Brother   . Diabetes Sister   . Colon cancer Neg Hx   . Esophageal cancer Neg Hx   . Rectal cancer Neg Hx   . Stomach cancer Neg Hx     Social History   Tobacco Use  . Smoking status: Former Smoker    Packs/day: 0.30    Years: 20.00    Pack years: 6.00    Types: Cigarettes    Quit date: 02/26/1977    Years since quitting: 42.0  . Smokeless tobacco: Former Systems developer    Types: Chew  Substance Use Topics  . Alcohol use: No    Alcohol/week: 0.0 standard drinks  . Drug use: No  Home Medications Prior to Admission medications   Medication Sig Start Date End Date Taking? Authorizing Provider  albuterol (VENTOLIN HFA) 108 (90 Base) MCG/ACT inhaler Inhale 2 puffs into the lungs every 6 (six) hours as needed for wheezing or shortness of breath. 02/11/18   [name], MD  amLODipine (NORVASC) 5 MG tablet Take 1 tablet (5 mg total) by mouth daily. 01/05/19   [name], MD  ascorbic acid (VITAMIN C) 500 MG tablet Take 1 tablet (500 mg total) by mouth daily. 02/17/19   Caren Griffins, MD  aspirin EC 81 MG tablet Take 81 mg by mouth daily.    [provider]  dexamethasone (DECADRON) 6 MG tablet Take 1 tablet (6 mg total) by mouth daily. Patient not taking: Reported on 02/23/2019 02/16/19   Caren Griffins, MD  fluticasone  [facility]) 50 MCG/ACT nasal spray Place 2 sprays into both nostrils daily. Patient taking differently: Place 2 sprays into both nostrils daily as needed for allergies.  10/21/17   [name], MD  guaiFENesin-dextromethorphan (ROBITUSSIN DM) 100-10 MG/5ML syrup Take 10 mLs by mouth every 4 (four) hours as needed for cough. 02/16/19   Caren Griffins, MD  losartan-hydrochlorothiazide (HYZAAR) 100-25 MG tablet Take 1 tablet by mouth every morning. 12/29/18   [name], MD  memantine (NAMENDA) 10 MG tablet Take 1 tablet (10 mg total) by mouth 2 (two) times daily. 12/02/18   Cameron Sprang, MD  Multiple Vitamin (MULTIVITAMIN WITH MINERALS) TABS tablet Take 1 tablet by mouth daily.    [provider]  omeprazole (PRILOSEC) 40 MG capsule TAKE 1 CAPSULE EVERY DAY Patient taking differently: Take 40 mg by mouth daily.  07/09/18   [name], MD  zinc sulfate 220 (50 Zn) MG capsule Take 1 capsule (220 mg total) by mouth daily. 02/17/19   Caren Griffins, MD  atenolol (TENORMIN) 50 MG tablet TAKE 1 TABLET EVERY DAY (NEED MD APPOINTMENT FOR REFILLS) Patient taking differently: Take 50 mg by mouth daily.  07/09/18 02/23/19  [name], MD    Allergies    Oxycodone and Tramadol  Review of Systems   Review of Systems  All other systems reviewed and are negative.   Physical Exam Updated Vital Signs BP 103/82   Pulse (!) 134   Temp 98 F (36.7 C) (Oral)   Resp (!) 35   Ht 5\' 8"  (1.727 m)   Wt 81.8 kg   SpO2 97%   BMI 27.42 kg/m   Physical Exam Vitals and nursing note reviewed.   79 year old male, resting comfortably and in no acute distress. Vital signs are significant for elevated heart rate and respiratory rate. Oxygen saturation is 97%, which is normal. Head is normocephalic and atraumatic. PERRLA, EOMI. Oropharynx is clear. Neck is nontender and supple without adenopathy or JVD. Back is nontender and there is no CVA tenderness. Lungs are clear without rales,  wheezes, or rhonchi. Chest is nontender. Heart is tachycardic without murmur. Abdomen is soft, flat, nontender without masses or hepatosplenomegaly and peristalsis is normoactive. Extremities have no cyanosis or edema, full range of motion is present. Skin is warm and dry without rash. Neurologic: Mental status is normal, cranial nerves are intact, there are no motor or sensory deficits. ED Results / Procedures / Treatments   Labs (all labs ordered are listed, but only abnormal results are displayed) Labs Reviewed  BASIC METABOLIC PANEL - Abnormal; Notable for the following components:  Result Value   Sodium 129 (*)    Glucose, Bld 118 (*)    Calcium 8.8 (*)    All other components within normal limits  D-DIMER, QUANTITATIVE (NOT AT [facility]) - Abnormal; Notable for the following components:   D-Dimer, Quant 1.02 (*)    All other components within normal limits  HEPATIC FUNCTION PANEL - Abnormal; Notable for the following components:   Total Protein 6.4 (*)    Albumin 3.1 (*)    All other components within normal limits  URINALYSIS, ROUTINE W REFLEX MICROSCOPIC - Abnormal; Notable for the following components:   Color, Urine STRAW (*)    Specific Gravity, Urine 1.004 (*)    All other components within normal limits  CBC  RAPID URINE DRUG SCREEN, HOSP PERFORMED  TROPONIN I (HIGH SENSITIVITY)  TROPONIN I (HIGH SENSITIVITY)    EKG EKG Interpretation  Date/Time:  Sunday March 01 2019 21:49:34 EST Ventricular Rate:  139 PR Interval:    QRS Duration: 97 QT Interval:  317 QTC Calculation: 482 R Axis:   27 Text Interpretation: Sinus tachycardia ST depression, probably rate related Borderline prolonged QT interval Baseline wander in lead(s) V6 sinus tachycardia Confirmed by Charlesetta Shanks [phone]) on 03/01/2019 11:45:49 PM   Radiology DG Chest Portable 1 View  Result Date: 03/01/2019 CLINICAL DATA:  Tachycardia. EXAM: PORTABLE CHEST 1 VIEW COMPARISON:  February 23, 2019  FINDINGS: There is persistent right lower lobe airspace disease favored to represent atelectasis. There is no pneumothorax. No large pleural effusion. No focal infiltrate. The heart size is stable from prior study. There is no acute osseous abnormality detected. IMPRESSION: No active disease. Electronically Signed   By: Constance Holster M.D.   On: 03/01/2019 22:40    Procedures Procedures (including critical care time)  Medications Ordered in ED Medications  sodium chloride flush (NS) 0.9 % injection 3 mL (has no administration in time range)  0.9 %  sodium chloride infusion (has no administration in time range)    ED Course  I have reviewed the triage vital signs and the nursing notes.  Pertinent labs & imaging results that were available during my care of the patient were reviewed by me and considered in my medical decision making (see chart for details).  MDM Rules/Calculators/A&P Supraventricular tachycardia.  Patient spontaneously converted to sinus rhythm without any interventions being done.  He does not notice she feels any better or worse.  Labs do show mild hyponatremia which is not significantly changed from results from about 1 week ago.  D-dimer has been ordered as part of medical screening exam and is mildly elevated, but given spontaneous conversion, there is low suspicion for pulmonary embolism and I do not feel a CT angiogram is warranted.  Chest x-ray shows atelectasis the right base.  ECG on arrival did show a supraventricular tachycardia.  Review of old records shows recent ED visits for COVID-19 symptoms.  He is felt to be safe for discharge and is advised to follow-up with PCP, return if symptoms recur.  Serafino L Viger was evaluated in Emergency Department on 03/02/2019 for the symptoms described in the history of present illness. He was evaluated in the context of the global COVID-19 pandemic, which necessitated consideration that the patient might be at risk for infection  with the SARS-CoV-2 virus that causes COVID-19. Institutional protocols and algorithms that pertain to the evaluation of patients at risk for COVID-19 are in a state of rapid change based on information released by regulatory bodies including  the [employer] and federal and state organizations. These policies and algorithms were followed during the patient's care in the ED.  Final Clinical Impression(s) / ED Diagnoses Final diagnoses:  SVT (supraventricular tachycardia) ([place])  COVID-19 virus infection    Rx / DC Orders ED Discharge Orders    None       Delora Fuel, MD [id number]    Delora Fuel, MD [id number]

## 2019-03-02 NOTE — Discharge Instructions (Signed)
Return if you are having any problems. 

## 2019-03-05 ENCOUNTER — Other Ambulatory Visit: Payer: Self-pay

## 2019-03-06 ENCOUNTER — Other Ambulatory Visit: Payer: Self-pay

## 2019-03-06 ENCOUNTER — Ambulatory Visit: Payer: Medicare HMO | Admitting: Family Medicine

## 2019-03-06 ENCOUNTER — Ambulatory Visit (INDEPENDENT_AMBULATORY_CARE_PROVIDER_SITE_OTHER): Payer: Medicare Other | Admitting: Family Medicine

## 2019-03-06 ENCOUNTER — Encounter: Payer: Self-pay | Admitting: Family Medicine

## 2019-03-06 VITALS — BP 132/64 | HR 101 | Temp 97.2°F | Wt 178.2 lb

## 2019-03-06 DIAGNOSIS — U071 COVID-19: Secondary | ICD-10-CM

## 2019-03-06 DIAGNOSIS — I10 Essential (primary) hypertension: Secondary | ICD-10-CM

## 2019-03-06 DIAGNOSIS — J988 Other specified respiratory disorders: Secondary | ICD-10-CM | POA: Diagnosis not present

## 2019-03-06 DIAGNOSIS — I471 Supraventricular tachycardia: Secondary | ICD-10-CM | POA: Diagnosis not present

## 2019-03-06 NOTE — Patient Outreach (Addendum)
[facility]) Care Management  [place]  03/06/2019   Eric Richmond [birth year] [id number]  Subjective: Telephone call to spouse [name].  She reports that patient is doing pretty good but having some problems with blood pressure fluctuations and that he is to see PCP on today. Reviewed blood pressure readings with wife and most recent reading was 153/102.  She denies patient having headache or dizziness.  Advised her to take reading to physician visit today so that he can review reading as changes may need to be made to medications. She verbalized understanding.  Objective:   Encounter Medications:  Outpatient Encounter Medications as of 03/06/2019  Medication Sig  . albuterol (VENTOLIN HFA) 108 (90 Base) MCG/ACT inhaler Inhale 2 puffs into the lungs every 6 (six) hours as needed for wheezing or shortness of breath.  Marland Kitchen amLODipine (NORVASC) 5 MG tablet Take 1 tablet (5 mg total) by mouth daily.  Marland Kitchen ascorbic acid (VITAMIN C) 500 MG tablet Take 1 tablet (500 mg total) by mouth daily.  Marland Kitchen aspirin EC 81 MG tablet Take 81 mg by mouth daily.  Marland Kitchen dexamethasone (DECADRON) 6 MG tablet Take 1 tablet (6 mg total) by mouth daily. (Patient not taking: Reported on 02/23/2019)  . fluticasone (FLONASE) 50 MCG/ACT nasal spray Place 2 sprays into both nostrils daily. (Patient taking differently: Place 2 sprays into both nostrils daily as needed for allergies. )  . guaiFENesin-dextromethorphan (ROBITUSSIN DM) 100-10 MG/5ML syrup Take 10 mLs by mouth every 4 (four) hours as needed for cough.  . losartan-hydrochlorothiazide (HYZAAR) 100-25 MG tablet Take 1 tablet by mouth every morning.  . memantine (NAMENDA) 10 MG tablet Take 1 tablet (10 mg total) by mouth 2 (two) times daily.  . Multiple Vitamin (MULTIVITAMIN WITH MINERALS) TABS tablet Take 1 tablet by mouth daily.  Marland Kitchen omeprazole (PRILOSEC) 40 MG capsule TAKE 1 CAPSULE EVERY DAY (Patient taking differently: Take 40 mg by mouth daily. )  .  zinc sulfate 220 (50 Zn) MG capsule Take 1 capsule (220 mg total) by mouth daily.  . [DISCONTINUED] atenolol (TENORMIN) 50 MG tablet TAKE 1 TABLET EVERY DAY (NEED MD APPOINTMENT FOR REFILLS) (Patient taking differently: Take 50 mg by mouth daily. )   No facility-administered encounter medications on file as of 03/06/2019.    Functional Status:  In your present state of health, do you have any difficulty performing the following activities: 02/23/2019 02/13/2019  Hearing? Tempie Donning  Comment has hearing aids -  Vision? N N  Difficulty concentrating or making decisions? N Y  Walking or climbing stairs? N N  Dressing or bathing? N N  Doing errands, shopping? Tempie Donning  Comment wife takes -  Conservation officer, nature and eating ? Y -  Using the Toilet? N -  In the past six months, have you accidently leaked urine? N -  Do you have problems with loss of bowel control? N -  Managing your Medications? Y -  Comment wife completes medications -  Managing your Finances? Y -  Comment Wife does -  Horticulturist, commercial your Housekeeping? Y -  Comment wife handles -  Some recent data might be hidden    Fall/Depression Screening: Fall Risk  02/23/2019 12/02/2018 04/25/2018  Falls in the past year? 0 0 0  Number falls in past yr: - 0 0  Injury with Fall? - 0 0  Follow up - Falls evaluation completed -   [facility] 2/9 Scores 02/23/2019 08/13/2017 08/09/2016 11/18/2014  PHQ - 2  Score 0 0 1 0    Assessment: Patient continues with COVID 19 recovery.  Patient memory better per wife.   Plan:  [facility] CM Care Plan Problem One     Most Recent Value  Care Plan Problem One  Recent Hospitalization related to [place]  Role Documenting the Problem One  Care Management Telephonic [place] for Problem One  Active  [facility] Long Term Goal   Patient will not experience unplanned hospital readmission within the next 31 days  [facility] Long Term Goal Start Date  02/23/19  Interventions for Problem One Long Term Goal  RN CM discussed  with spouse patient overall condition.  Discussed COVID-19 and recovery.  Wife continues to support patient.    [facility] CM Short Term Goal #1   Patient will keep systolic blood pressure less than 150.  [facility] CM Short Term Goal #1 Start Date  02/23/19  Interventions for Short Term Goal #1  RN CM discussed blood pressure reading with wife.  Discused patient medications and regimen.  Advised to continue to monitor blood pressure and discuss with physician readings.   [facility]N CM Short Term Goal #2   Patient will follow up with PCP within 7 days.  [facility] CM Short Term Goal #2 Start Date  02/23/19  [facility] CM Short Term Goal #2 Met Date  03/06/19     RN CM will contact again in the month of January and wife agreeable.    Jone Baseman, RN, MSN [place] Management Care Management Coordinator Direct Line [phone] Cell [phone] Toll Free: [phone]  Fax: [phone]

## 2019-03-06 NOTE — Progress Notes (Signed)
   Subjective:    Patient ID: Eric Richmond, male    DOB: 1941-02-16, 79 y.o.   MRN: [id number]  HPI Here with his wife to follow up an ER visit on 03-01-19 for elevated BP and a rapid heartbeat. He felt fine that day and never complained to his wife , however when she checked his vital signs his BP was 160/ 100 and his heart rate was over 150. She called EMS, and when they checked him the BP was down to 150/90 but the rate was still in the 140 range. At the ER his exam was otherwise normal. Labs were unremarkable. D dimer was negative. He spontaneously converted to NSR, and he has been fine ever since. His BP this morning was 150/90 and the rate was 74. Of note he tested positive for the Covid-19 virus on 02-10-19.    Review of Systems  Constitutional: Negative.   Respiratory: Negative.   Cardiovascular: Negative.   Gastrointestinal: Negative.   Genitourinary: Negative.   Neurological: Negative.        Objective:   Physical Exam Constitutional:      Appearance: Normal appearance.  Cardiovascular:     Rate and Rhythm: Normal rate and regular rhythm.     Pulses: Normal pulses.     Heart sounds: Normal heart sounds.  Pulmonary:     Effort: Pulmonary effort is normal.     Breath sounds: Normal breath sounds.  Neurological:     General: No focal deficit present.     Mental Status: He is alert and oriented to person, place, and time.           Assessment & Plan:  He had a brief run of SVT and has been fine since then. No etiology has been found, although I suppose the Covid infection may have played a role. His wife will monitor him closely at home and he will follow up as needed.  Alysia Penna, MD

## 2019-03-09 ENCOUNTER — Ambulatory Visit: Payer: Medicare HMO | Admitting: Family Medicine

## 2019-03-12 ENCOUNTER — Ambulatory Visit: Payer: Medicare HMO

## 2019-03-13 ENCOUNTER — Other Ambulatory Visit: Payer: Self-pay | Admitting: Neurology

## 2019-03-13 ENCOUNTER — Other Ambulatory Visit: Payer: Self-pay | Admitting: Family Medicine

## 2019-03-23 ENCOUNTER — Telehealth: Payer: Self-pay | Admitting: Family Medicine

## 2019-03-23 MED ORDER — OMEPRAZOLE 40 MG PO CPDR: 40.0000 mg | DELAYED_RELEASE_CAPSULE | Freq: Every day | ORAL | 3 refills | Status: DC

## 2019-03-23 NOTE — Telephone Encounter (Signed)
Rx has been sent in. Patient is aware. 

## 2019-03-23 NOTE — Telephone Encounter (Signed)
Patient needs a refill and he's completely out  omeprazole (PRILOSEC) 40 MG capsule   Sent to:  [facility], [address] Phone:  [phone]  Fax:  [phone]

## 2019-03-24 ENCOUNTER — Other Ambulatory Visit: Payer: Self-pay | Admitting: Family Medicine

## 2019-03-27 ENCOUNTER — Other Ambulatory Visit: Payer: Self-pay

## 2019-03-27 NOTE — Patient Outreach (Signed)
[facility]) Care Management  [place]  03/27/2019   Eric Richmond 1940/05/23 [id number]  Subjective: Telephone call to wife [name].  She reports that patient doing good but reports some irritability with patient. Advised that with Alzheimer's/dementia that patient do tend to get irritable at times.  Stressed importance of trying to maintain schedule and sense of normalcy.  She verbalized understanding.  She reports that they both have had some shortness of breath at times. Advised that those could be part of COVID-19 and recovery. She verbalized understanding but CM advised that if shortness of breath worsens to notify physician.  Patient blood pressure better now and that last reading was 134/95.  Discussed low salt diet and importance of blood pressure control.  She verbalized understanding and denies needs.    Objective:   Encounter Medications:  Outpatient Encounter Medications as of 03/27/2019  Medication Sig  . albuterol (VENTOLIN HFA) 108 (90 Base) MCG/ACT inhaler Inhale 2 puffs into the lungs every 6 (six) hours as needed for wheezing or shortness of breath.  Marland Kitchen amLODipine (NORVASC) 5 MG tablet Take 1 tablet (5 mg total) by mouth daily.  Marland Kitchen ascorbic acid (VITAMIN C) 500 MG tablet Take 1 tablet (500 mg total) by mouth daily.  Marland Kitchen aspirin EC 81 MG tablet Take 81 mg by mouth daily.  Marland Kitchen dexamethasone (DECADRON) 6 MG tablet Take 1 tablet (6 mg total) by mouth daily.  . fluticasone (FLONASE) 50 MCG/ACT nasal spray Place 2 sprays into both nostrils daily. (Patient taking differently: Place 2 sprays into both nostrils daily as needed for allergies. )  . guaiFENesin-dextromethorphan (ROBITUSSIN DM) 100-10 MG/5ML syrup Take 10 mLs by mouth every 4 (four) hours as needed for cough.  . losartan-hydrochlorothiazide (HYZAAR) 100-25 MG tablet TAKE 1 TABLET BY MOUTH ONCE DAILY IN THE MORNING  . memantine (NAMENDA) 10 MG tablet Take 1 tablet by mouth twice daily  . Multiple  Vitamin (MULTIVITAMIN WITH MINERALS) TABS tablet Take 1 tablet by mouth daily.  Marland Kitchen omeprazole (PRILOSEC) 40 MG capsule Take 1 capsule (40 mg total) by mouth daily.  Marland Kitchen zinc sulfate 220 (50 Zn) MG capsule Take 1 capsule (220 mg total) by mouth daily.  . [DISCONTINUED] atenolol (TENORMIN) 50 MG tablet TAKE 1 TABLET EVERY DAY (NEED MD APPOINTMENT FOR REFILLS) (Patient taking differently: Take 50 mg by mouth daily. )   No facility-administered encounter medications on file as of 03/27/2019.    Functional Status:  In your present state of health, do you have any difficulty performing the following activities: 02/23/2019 02/13/2019  Hearing? Tempie Donning  Comment has hearing aids -  Vision? N N  Difficulty concentrating or making decisions? N Y  Walking or climbing stairs? N N  Dressing or bathing? N N  Doing errands, shopping? Tempie Donning  Comment wife takes -  Conservation officer, nature and eating ? Y -  Using the Toilet? N -  In the past six months, have you accidently leaked urine? N -  Do you have problems with loss of bowel control? N -  Managing your Medications? Y -  Comment wife completes medications -  Managing your Finances? Y -  Comment Wife does -  Horticulturist, commercial your Housekeeping? Y -  Comment wife handles -  Some recent data might be hidden    Fall/Depression Screening: Fall Risk  03/27/2019 02/23/2019 12/02/2018  Falls in the past year? 0 0 0  Number falls in past yr: - - 0  Injury with  Fall? - - 0  Follow up - - Falls evaluation completed   PHQ 2/9 Scores 02/23/2019 08/13/2017 08/09/2016 11/18/2014  PHQ - 2 Score 0 0 1 0    Assessment: Patient continues COVID-19 recovery. Wife continues to be primary caregiver for patient and she manages patient chronic illnesses.   Plan:  [facility] CM Care Plan Problem One     Most Recent Value  Care Plan Problem One  Recent Hospitalization related to [place]  Role Documenting the Problem One  Care Management Telephonic [place] for Problem  One  Active  [facility] Long Term Goal   Patient will not experience unplanned hospital readmission within the next 31 days  [facility] Long Term Goal Start Date  02/23/19  [facility] Long Term Goal Met Date  03/27/19  [facility] CM Short Term Goal #1   Patient will keep systolic blood pressure less than 150.  THN CM Short Term Goal #1 Start Date  03/27/19  Interventions for Short Term Goal #1  RN CM discussed blood pressure readings with spouse.  Discussed importance of blood pressure control and low salt diet.      RN CM will contact in February and wife agreeable.    Jone Baseman, RN, MSN [place] Management Care Management Coordinator Direct Line [phone] Cell [phone] Toll Free: [phone]  Fax: [phone]

## 2019-04-02 ENCOUNTER — Encounter: Payer: Self-pay | Admitting: Physician Assistant

## 2019-04-02 ENCOUNTER — Other Ambulatory Visit: Payer: Self-pay

## 2019-04-02 ENCOUNTER — Ambulatory Visit: Payer: Medicare Other | Admitting: Physician Assistant

## 2019-04-02 VITALS — BP 120/84 | HR 82 | Temp 96.7°F | Ht 68.0 in | Wt 178.0 lb

## 2019-04-02 DIAGNOSIS — R1013 Epigastric pain: Secondary | ICD-10-CM

## 2019-04-02 DIAGNOSIS — Z86010 Personal history of colonic polyps: Secondary | ICD-10-CM

## 2019-04-02 MED ORDER — PANTOPRAZOLE SODIUM 40 MG PO TBEC: 40.0000 mg | DELAYED_RELEASE_TABLET | Freq: Every day | ORAL | 5 refills | Status: DC

## 2019-04-02 MED ORDER — NA SULFATE-K SULFATE-MG SULF 17.5-3.13-1.6 GM/177ML PO SOLN: 1.0000 | Freq: Once | ORAL | 0 refills | Status: AC

## 2019-04-02 NOTE — Patient Instructions (Signed)
If you are age 79 or older, your body mass index should be between 23-30. Your Body mass index is 27.06 kg/m. If this is out of the aforementioned range listed, please consider follow up with your Primary Care Provider.  If you are age 34 or younger, your body mass index should be between 19-25. Your Body mass index is 27.06 kg/m. If this is out of the aformentioned range listed, please consider follow up with your Primary Care Provider.   We have sent the following medications to your pharmacy for you to pick up at your convenience: Stop Omeprazole.  Start Pantoprazole 40 mg daily 30-60 minutes before breakfast.

## 2019-04-02 NOTE — Progress Notes (Signed)
Chief Complaint: "Stomach pain"  HPI:    Eric Richmond is a 79 year old African-American male with a past medical history as listed below, known to Dr. Hilarie Fredrickson, who was referred to me by [name], MD for a complaint of "stomach pain".      08/07/2016 colonoscopy with one 10 mm polyp at hepatic flexure, two 4-6 mm polyps in the transverse colon, severe diverticulosis from ascending colon to sigmoid colon and internal hemorrhoids.  Pathology showed adenomatous polyps and repeat was recommended in 3 years.    03/28/2019 patient seen in the ER for SVT.  This resolved spontaneously and he has been fine since.    Today, the patient presents to clinic accompanied by his wife who does assist with history.  He explains that over the past 1 to 2 weeks he has been having some epigastric discomfort which seems to radiate up into his chest.  This is pretty much constant and does not necessarily seem worse after eating.  He does think his Omeprazole 40 mg daily helps a little bit, but still does not take it away.  Tells me it is rated as a 5/10 constantly.    Patient did recently test positive for Covid in early December.  He was hospitalized with pneumonia.    Denies fever, chills, weight loss, change in bowel habits, heartburn, reflux, nausea or vomiting.  Past Medical History:  Diagnosis Date  . Allergy   . Anal fissure 20 years ago  . Diabetes mellitus without complication ([place]) 0000000  . ED (erectile dysfunction)   . Essential hypertension 10/22/2006   Qualifier: Diagnosis of  By: Sarajane Jews MD, Ishmael Holter   . GERD (gastroesophageal reflux disease)   . Hard of hearing   . 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 OSA (obstructive sleep apnea)    per Dr. Gwenette Greet , cpap -2.0  . Osteoarthritis    Right knee and right thumb; s/p total knee replacement  . Pneumonia 2015    Past Surgical History:  Procedure Laterality Date  . ANAL FISSURE REPAIR    . colonoscopy  08/07/2016   per Dr. Hilarie Fredrickson,  adenomatous polyps removed, repeat in 3 years   . KNEE ARTHROSCOPY     left knee  . KNEE CLOSED REDUCTION Left 01/13/2014   Procedure: CLOSED MANIPULATION LEFT KNEE;  Surgeon: Gearlean Alf, MD;  Location: [facility] ORS;  Service: Orthopedics;  Laterality: Left;  . SURGERY AGE 56 OR 7 ON LEFT KNEE FOR A "GLAND BEHIND THE KNEE"    . TOTAL KNEE ARTHROPLASTY Left 10/26/2013   Procedure: LEFT TOTAL KNEE ARTHROPLASTY;  Surgeon: Gearlean Alf, MD;  Location: [facility] ORS;  Service: Orthopedics;  Laterality: Left;    Current Outpatient Medications  Medication Sig Dispense Refill  . albuterol (VENTOLIN HFA) 108 (90 Base) MCG/ACT inhaler Inhale 2 puffs into the lungs every 6 (six) hours as needed for wheezing or shortness of breath. 1 Inhaler 11  . amLODipine (NORVASC) 5 MG tablet Take 1 tablet (5 mg total) by mouth daily. 90 tablet 2  . ascorbic acid (VITAMIN C) 500 MG tablet Take 1 tablet (500 mg total) by mouth daily. 6 tablet 0  . aspirin EC 81 MG tablet Take 81 mg by mouth daily.    . fluticasone (FLONASE) 50 MCG/ACT nasal spray Place 2 sprays into both nostrils daily. (Patient taking differently: Place 2 sprays into both nostrils daily as needed for allergies. ) 48 g 3  . losartan-hydrochlorothiazide (HYZAAR) 100-25 MG tablet TAKE  1 TABLET BY MOUTH ONCE DAILY IN THE MORNING 30 tablet 2  . memantine (NAMENDA) 10 MG tablet Take 1 tablet by mouth twice daily 60 tablet 0  . Multiple Vitamin (MULTIVITAMIN WITH MINERALS) TABS tablet Take 1 tablet by mouth daily.    Marland Kitchen omeprazole (PRILOSEC) 40 MG capsule Take 1 capsule (40 mg total) by mouth daily. 90 capsule 3  . zinc sulfate 220 (50 Zn) MG capsule Take 1 capsule (220 mg total) by mouth daily. 6 capsule 0   No current facility-administered medications for this visit.    Allergies as of 04/02/2019 - Review Complete 04/02/2019  Allergen Reaction Noted  . Oxycodone  11/17/2013  . Tramadol  11/17/2013    Family History  Problem Relation Age of Onset  .  Breast cancer Sister   . Diabetes Sister   . Hypertension Sister   . Thyroid cancer Sister   . Diabetes Mother   . Hypertension Mother   . Diabetes Father   . Hypertension Father   . Diabetes Sister   . Hypertension Other   . Diabetes Brother   . Hypertension Brother   . Heart failure Brother   . Heart failure Brother   . Diabetes Sister   . Colon cancer Neg Hx   . Esophageal cancer Neg Hx   . Rectal cancer Neg Hx   . Stomach cancer Neg Hx     Social History   Socioeconomic History  . Marital status: Married    Spouse name: Not on file  . Number of children: Not on file  . Years of education: 33  . Highest education level: High school graduate  Occupational History  . Occupation: Fitter  Tobacco Use  . Smoking status: Former Smoker    Packs/day: 0.30    Years: 20.00    Pack years: 6.00    Types: Cigarettes    Quit date: 02/26/1977    Years since quitting: 42.1  . Smokeless tobacco: Former Systems developer    Types: Chew  Substance and Sexual Activity  . Alcohol use: No    Alcohol/week: 0.0 standard drinks  . Drug use: No  . Sexual activity: Yes    Partners: Female  Other Topics Concern  . Not on file  Social History Narrative   Epworth Sleepiness Scale = 5 (12/14/2014)      Right handed      Graduated HS      Lives with wife   Social Determinants of Health   Financial Resource Strain:   . Difficulty of Paying Living Expenses: Not on file  Food Insecurity: No Food Insecurity  . Worried About Charity fundraiser in the Last Year: Never true  . Ran Out of Food in the Last Year: Never true  Transportation Needs: No Transportation Needs  . Lack of Transportation (Medical): No  . Lack of Transportation (Non-Medical): No  Physical Activity:   . Days of Exercise per Week: Not on file  . Minutes of Exercise per Session: Not on file  Stress:   . Feeling of Stress : Not on file  Social Connections:   . Frequency of Communication with Friends and Family: Not on file   . Frequency of Social Gatherings with Friends and Family: Not on file  . Attends Religious Services: Not on file  . Active Member of Clubs or Organizations: Not on file  . Attends Archivist Meetings: Not on file  . Marital Status: Not on file  Intimate Partner Violence:   .  Fear of Current or Ex-Partner: Not on file  . Emotionally Abused: Not on file  . Physically Abused: Not on file  . Sexually Abused: Not on file    Review of Systems:    Constitutional: No weight loss, fever or chills Cardiovascular: No chest pain  Respiratory: No SOB  Gastrointestinal: See HPI and otherwise negative   Physical Exam:  Vital signs: BP 120/84 (BP Location: Left Arm, Patient Position: Sitting, Cuff Size: Normal)   Pulse 82   Temp (!) 96.7 F (35.9 C)   Ht 5\' 8"  (1.727 m)   Wt 178 lb (80.7 kg)   SpO2 99%   BMI 27.06 kg/m   Constitutional:   Pleasant AA male appears to be in NAD, Well developed, Well nourished, alert and cooperative Respiratory: Respirations even and unlabored. Lungs clear to auscultation bilaterally.   No wheezes, crackles, or rhonchi.  Cardiovascular: Normal S1, S2. No MRG. Regular rate and rhythm. No peripheral edema, cyanosis or pallor.  Gastrointestinal:  Soft, nondistended,mild epigastric ttp. No rebound or guarding. Normal bowel sounds. No appreciable masses or hepatomegaly. Rectal:  Not performed.  Psychiatric:  Demonstrates good judgement and reason without abnormal affect or behaviors.  RELEVANT LABS AND IMAGING: CBC    Component Value Date/Time   WBC 8.1 03/01/2019 2207   RBC 4.61 03/01/2019 2207   HGB 13.7 03/01/2019 2207   HCT 39.8 03/01/2019 2207   PLT 159 03/01/2019 2207   MCV 86.3 03/01/2019 2207   MCV 88.5 06/15/2013 2114   MCH 29.7 03/01/2019 2207   MCHC 34.4 03/01/2019 2207   RDW 13.2 03/01/2019 2207   LYMPHSABS 2.6 02/23/2019 1912   MONOABS 1.0 02/23/2019 1912   EOSABS 0.1 02/23/2019 1912   BASOSABS 0.0 02/23/2019 1912    CMP      Component Value Date/Time   NA 129 (L) 03/01/2019 2207   K 3.7 03/01/2019 2207   CL 99 03/01/2019 2207   CO2 22 03/01/2019 2207   GLUCOSE 118 (H) 03/01/2019 2207   BUN 14 03/01/2019 2207   CREATININE 0.93 03/01/2019 2207   CALCIUM 8.8 (L) 03/01/2019 2207   PROT 6.4 (L) 03/01/2019 2207   ALBUMIN 3.1 (L) 03/01/2019 2207   AST 18 03/01/2019 2207   ALT 16 03/01/2019 2207   ALKPHOS 64 03/01/2019 2207   BILITOT 0.5 03/01/2019 2207   GFRNONAA >60 03/01/2019 2207   GFRAA >60 03/01/2019 2207    Assessment: 1.  Epigastric pain: Over the past 1 to 2 weeks, only minimally controlled on Omeprazole 40 mg daily; consider gastritis versus other 2.  History of adenomatous polyps: Last colonoscopy in June 2018, repeat was recommended in 3 years  Plan: 1.  Patient would like to go ahead and be scheduled for his surveillance colonoscopy.  Went ahead and scheduled him with Dr. Hilarie Fredrickson in the [facility].  Did discuss risks, benefits, limitations and alternatives and the patient agrees to proceed.  Patient does not need to be Covid tested as he just had Covid in December and it has not been more than 60 days. 2.  Stop Omeprazole.  Prescribed Pantoprazole 40 mg daily, 30 to 60 minutes before breakfast.  #30 with 3 refills.  Did discuss that if this seems to help but not completely take care of his problem we can increase to twice daily dosing for a period of time to see if this helps. 3.  Patient to follow in clinic per recommendations from Dr. Hilarie Fredrickson after time of procedure.  Ellouise Newer, PA-C  [facility] 04/02/2019, 8:43 AM  Cc: [name], MD

## 2019-04-10 ENCOUNTER — Other Ambulatory Visit: Payer: Self-pay

## 2019-04-10 ENCOUNTER — Encounter: Payer: Self-pay | Admitting: Internal Medicine

## 2019-04-10 ENCOUNTER — Ambulatory Visit (AMBULATORY_SURGERY_CENTER): Payer: Medicare Other | Admitting: Internal Medicine

## 2019-04-10 VITALS — BP 135/84 | HR 89 | Temp 97.7°F | Resp 11 | Ht 68.0 in | Wt 178.0 lb

## 2019-04-10 DIAGNOSIS — D125 Benign neoplasm of sigmoid colon: Secondary | ICD-10-CM

## 2019-04-10 DIAGNOSIS — R1013 Epigastric pain: Secondary | ICD-10-CM

## 2019-04-10 DIAGNOSIS — D124 Benign neoplasm of descending colon: Secondary | ICD-10-CM | POA: Diagnosis not present

## 2019-04-10 DIAGNOSIS — Z1211 Encounter for screening for malignant neoplasm of colon: Secondary | ICD-10-CM | POA: Diagnosis not present

## 2019-04-10 DIAGNOSIS — R109 Unspecified abdominal pain: Secondary | ICD-10-CM | POA: Diagnosis not present

## 2019-04-10 HISTORY — PX: OTHER SURGICAL HISTORY: SHX169

## 2019-04-10 MED ORDER — SODIUM CHLORIDE 0.9 % IV SOLN: 500.0000 mL | Freq: Once | INTRAVENOUS | Status: DC

## 2019-04-10 NOTE — Progress Notes (Signed)
PT taken to PACU. Monitors in place. VSS. Report given to RN. 

## 2019-04-10 NOTE — Op Note (Signed)
[place] Patient Name: Eric Richmond Procedure Date: 04/10/2019 10:41 AM MRN: [id number] Endoscopist: Jerene Bears , MD Age: 79 Referring MD:  Date of Birth: 05-27-1940 Gender: Male Account #: [id number] Procedure:                Colonoscopy Indications:              High risk colon cancer surveillance: Personal                            history of adenoma (10 mm or greater in size), Last                            colonoscopy 3 years ago Medicines:                Monitored Anesthesia Care Procedure:                Pre-Anesthesia Assessment:                           - Prior to the procedure, a History and Physical                            was performed, and patient medications and                            allergies were reviewed. The patient's tolerance of                            previous anesthesia was also reviewed. The risks                            and benefits of the procedure and the sedation                            options and risks were discussed with the patient.                            All questions were answered, and informed consent                            was obtained. Prior Anticoagulants: The patient has                            taken no previous anticoagulant or antiplatelet                            agents. ASA Grade Assessment: II - A patient with                            mild systemic disease. After reviewing the risks                            and benefits, the patient was deemed in  satisfactory condition to undergo the procedure.                           After obtaining informed consent, the colonoscope                            was passed under direct vision. Throughout the                            procedure, the patient's blood pressure, pulse, and                            oxygen saturations were monitored continuously. The                            Colonoscope was introduced through the  anus and                            advanced to the cecum, identified by appendiceal                            orifice and ileocecal valve. The colonoscopy was                            performed without difficulty. The patient tolerated                            the procedure well. The quality of the bowel                            preparation was good. The ileocecal valve,                            appendiceal orifice, and rectum were photographed. Scope In: [id number] Scope Out: 11:29:39 AM Scope Withdrawal Time: 0 hours 19 minutes 10 seconds  Total Procedure Duration: 0 hours 23 minutes 22 seconds  Findings:                 The digital rectal exam was normal.                           Two sessile polyps were found in the descending                            colon. The polyps were 3 to 5 mm in size. These                            polyps were removed with a cold snare. Resection                            and retrieval were complete.                           A 5 mm polyp was found in the sigmoid  colon. The                            polyp was sessile. The polyp was removed with a                            cold snare. Resection and retrieval were complete.                           Many small and large-mouthed diverticula were found                            from ascending colon to sigmoid colon.                           Internal hemorrhoids were found during                            retroflexion. The hemorrhoids were small. Complications:            No immediate complications. Estimated Blood Loss:     Estimated blood loss was minimal. Impression:               - Two 3 to 5 mm polyps in the descending colon,                            removed with a cold snare. Resected and retrieved.                           - One 5 mm polyp in the sigmoid colon, removed with                            a cold snare. Resected and retrieved.                           - Severe  diverticulosis from ascending colon to                            sigmoid colon.                           - Internal hemorrhoids. Recommendation:           - Patient has a contact number available for                            emergencies. The signs and symptoms of potential                            delayed complications were discussed with the                            patient. Return to normal activities tomorrow.                            Written discharge instructions  were provided to the                            patient.                           - Resume previous diet.                           - Continue present medications.                           - Await pathology results.                           - Colonoscopy for screening/surveillance usually                            stops around age 1. Given you will be greater than                            73 years old in 3-5 years, you will likely not                            benefit from another colonoscopy for                            screening/surveillance. Jerene Bears, MD 04/10/2019 11:34:14 AM This report has been signed electronically.

## 2019-04-10 NOTE — Progress Notes (Signed)
Called to room to assist during endoscopic procedure.  Patient ID and intended procedure confirmed with present staff. Received instructions for my participation in the procedure from the performing physician.  

## 2019-04-10 NOTE — Patient Instructions (Signed)
Please read all of the handouts given to you  your recovery room nurse.  Try to increase the fiber in your diet, and drink plenty of water.  Thank-you for choosing [place] for your healthcare needs today.  YOU HAD AN ENDOSCOPIC PROCEDURE TODAY AT [facility]:   Refer to the procedure report that was given to you for any specific questions about what was found during the examination.  If the procedure report does not answer your questions, please call your gastroenterologist to clarify.  If you requested that your care partner not be given the details of your procedure findings, then the procedure report has been included in a sealed envelope for you to review at your convenience later.  YOU SHOULD EXPECT: Some feelings of bloating in the abdomen. Passage of more gas than usual.  Walking can help get rid of the air that was put into your GI tract during the procedure and reduce the bloating. If you had a lower endoscopy (such as a colonoscopy or flexible sigmoidoscopy) you may notice spotting of blood in your stool or on the toilet paper. If you underwent a bowel prep for your procedure, you may not have a normal bowel movement for a few days.  Please Note:  You might notice some irritation and congestion in your nose or some drainage.  This is from the oxygen used during your procedure.  There is no need for concern and it should clear up in a day or so.  SYMPTOMS TO REPORT IMMEDIATELY:   Following lower endoscopy (colonoscopy or flexible sigmoidoscopy):  Excessive amounts of blood in the stool  Significant tenderness or worsening of abdominal pains  Swelling of the abdomen that is new, acute  Fever of 100F or higher  For urgent or emergent issues, a gastroenterologist can be reached at any hour by calling [phone].   DIET:  We do recommend a small meal at first, but then you may proceed to your regular diet.  Drink plenty of fluids but you should avoid alcoholic beverages for  24 hours. Try to increase the fiber in your diet, and drink plenty of water.  ACTIVITY:  You should plan to take it easy for the rest of today and you should NOT DRIVE or use heavy machinery until tomorrow (because of the sedation medicines used during the test).    FOLLOW UP: Our staff will call the number listed on your records 48-72 hours following your procedure to check on you and address any questions or concerns that you may have regarding the information given to you following your procedure. If we do not reach you, we will leave a message.  We will attempt to reach you two times.  During this call, we will ask if you have developed any symptoms of COVID 19. If you develop any symptoms (ie: fever, flu-like symptoms, shortness of breath, cough etc.) before then, please call [phone].  If you test positive for Covid 19 in the 2 weeks post procedure, please call and report this information to [place].    If any biopsies were taken you will be contacted by phone or by letter within the next 1-3 weeks.  Please call us at [phone] if you have not heard about the biopsies in 3 weeks.    SIGNATURES/CONFIDENTIALITY: You and/or your care partner have signed paperwork which will be entered into your electronic medical record.  These signatures attest to the fact that that the information above on your After  Visit Summary has been reviewed and is understood.  Full responsibility of the confidentiality of this discharge information lies with you and/or your care-partner. 

## 2019-04-14 ENCOUNTER — Encounter: Payer: Self-pay | Admitting: Internal Medicine

## 2019-04-14 ENCOUNTER — Telehealth: Payer: Self-pay | Admitting: *Deleted

## 2019-04-14 ENCOUNTER — Other Ambulatory Visit: Payer: Self-pay

## 2019-04-14 NOTE — Patient Outreach (Signed)
[facility]) Care Management  [place]  04/14/2019   Eric Richmond 02/20/1941 [id number]  Subjective: Telephone call to wife [name].  She reports that patient is doing pretty good.  She reports that patient and herself had 1st COVID-19 vaccine and they also have follow up for second vaccine.  She reports that they have faired well.  She reports they continue COVID-19 precautions. Advised them to continue even as they receive the vaccine.  She verbalized understanding.  Patient blood pressure remains good per wife.  She continues to help patient manage due to his Alzheimer's. She voices no concerns.    Objective:   Encounter Medications:  Outpatient Encounter Medications as of 04/14/2019  Medication Sig  . albuterol (VENTOLIN HFA) 108 (90 Base) MCG/ACT inhaler Inhale 2 puffs into the lungs every 6 (six) hours as needed for wheezing or shortness of breath.  Marland Kitchen amLODipine (NORVASC) 5 MG tablet Take 1 tablet (5 mg total) by mouth daily.  Marland Kitchen ascorbic acid (VITAMIN C) 500 MG tablet Take 1 tablet (500 mg total) by mouth daily.  Marland Kitchen aspirin EC 81 MG tablet Take 81 mg by mouth daily.  . fluticasone (FLONASE) 50 MCG/ACT nasal spray Place 2 sprays into both nostrils daily. (Patient not taking: Reported on 04/10/2019)  . losartan-hydrochlorothiazide (HYZAAR) 100-25 MG tablet TAKE 1 TABLET BY MOUTH ONCE DAILY IN THE MORNING  . memantine (NAMENDA) 10 MG tablet Take 1 tablet by mouth twice daily  . Multiple Vitamin (MULTIVITAMIN WITH MINERALS) TABS tablet Take 1 tablet by mouth daily.  Marland Kitchen omeprazole (PRILOSEC) 40 MG capsule Take 1 capsule (40 mg total) by mouth daily. (Patient not taking: Reported on 04/10/2019)  . pantoprazole (PROTONIX) 40 MG tablet Take 1 tablet (40 mg total) by mouth daily.  Marland Kitchen zinc sulfate 220 (50 Zn) MG capsule Take 1 capsule (220 mg total) by mouth daily.  . [DISCONTINUED] atenolol (TENORMIN) 50 MG tablet TAKE 1 TABLET EVERY DAY (NEED MD APPOINTMENT FOR REFILLS)  (Patient taking differently: Take 50 mg by mouth daily. )   No facility-administered encounter medications on file as of 04/14/2019.    Functional Status:  In your present state of health, do you have any difficulty performing the following activities: 02/23/2019 02/13/2019  Hearing? Tempie Donning  Comment has hearing aids -  Vision? N N  Difficulty concentrating or making decisions? N Y  Walking or climbing stairs? N N  Dressing or bathing? N N  Doing errands, shopping? Tempie Donning  Comment wife takes -  Conservation officer, nature and eating ? Y -  Using the Toilet? N -  In the past six months, have you accidently leaked urine? N -  Do you have problems with loss of bowel control? N -  Managing your Medications? Y -  Comment wife completes medications -  Managing your Finances? Y -  Comment Wife does -  Horticulturist, commercial your Housekeeping? Y -  Comment wife handles -  Some recent data might be hidden    Fall/Depression Screening: Fall Risk  03/27/2019 02/23/2019 12/02/2018  Falls in the past year? 0 0 0  Number falls in past yr: - - 0  Injury with Fall? - - 0  Follow up - - Falls evaluation completed   PHQ 2/9 Scores 02/23/2019 08/13/2017 08/09/2016 11/18/2014  PHQ - 2 Score 0 0 1 0    Assessment: Patient continues to manage chronic illnesses with assistance of spouse.     Plan:  [place]  Problem One     Most Recent Value  Care Plan Problem One  Recent Hospitalization related to [place]  Role Documenting the Problem One  Care Management Telephonic [place] for Problem One  Active  THN CM Short Term Goal #1   Patient will keep systolic blood pressure less than 150.  THN CM Short Term Goal #1 Start Date  04/14/19  Interventions for Short Term Goal #1  Patient blood pressure less than 150.  Wife continues to limit salt intake and manage medications.       RN CM will contact in the month of April and wife agreeable.   Jone Baseman, RN, MSN [facility]  Management Coordinator Direct Line [phone] Cell [phone] Toll Free: [phone]  Fax: [phone]

## 2019-04-14 NOTE — Telephone Encounter (Signed)
  Follow up Call-  Call back number 04/10/2019 08/07/2016  Post procedure Call Back phone  # [phone]-wife,[name]'s # [phone]  Permission to leave phone message Yes Yes  Some recent data might be hidden     Patient questions:  Do you have a fever, pain , or abdominal swelling? No. Pain Score  0 *  Have you tolerated food without any problems? Yes.    Have you been able to return to your normal activities? Yes.    Do you have any questions about your discharge instructions: Diet   No. Medications  No. Follow up visit  No.  Do you have questions or concerns about your Care? No.  Actions: * If pain score is 4 or above: No action needed, pain <4.  1. Have you developed a fever since your procedure? no  2.   Have you had an respiratory symptoms (SOB or cough) since your procedure? no  3.   Have you tested positive for COVID 19 since your procedure no  4.   Have you had any family members/close contacts diagnosed with the COVID 19 since your procedure?  no   If yes to any of these questions please route to Joylene John, RN and Alphonsa Gin, Therapist, sports.

## 2019-04-14 NOTE — Progress Notes (Signed)
Addendum: Reviewed and agree with assessment and management plan. Morgyn Marut M, MD  

## 2019-04-17 ENCOUNTER — Ambulatory Visit: Payer: Self-pay

## 2019-04-20 DIAGNOSIS — H40023 Open angle with borderline findings, high risk, bilateral: Secondary | ICD-10-CM | POA: Diagnosis not present

## 2019-04-24 ENCOUNTER — Telehealth: Payer: Self-pay | Admitting: Family Medicine

## 2019-04-24 ENCOUNTER — Other Ambulatory Visit: Payer: Self-pay | Admitting: Family Medicine

## 2019-04-24 NOTE — Telephone Encounter (Signed)
Refill request also came in from the pharmacy. This medication has already been sent in.

## 2019-04-24 NOTE — Telephone Encounter (Signed)
Medication Refill: Memantine  Pharmacy: [place]: [phone]   Pt the completely out.

## 2019-05-02 ENCOUNTER — Other Ambulatory Visit: Payer: Self-pay

## 2019-05-02 ENCOUNTER — Encounter (HOSPITAL_COMMUNITY): Payer: Self-pay

## 2019-05-02 ENCOUNTER — Emergency Department (HOSPITAL_COMMUNITY): Payer: Medicare Other

## 2019-05-02 ENCOUNTER — Emergency Department (HOSPITAL_COMMUNITY)
Admission: EM | Admit: 2019-05-02 | Discharge: 2019-05-02 | Disposition: A | Discharge status: Home / Self Care | Payer: Medicare Other | Attending: Emergency Medicine | Admitting: Emergency Medicine

## 2019-05-02 DIAGNOSIS — E119 Type 2 diabetes mellitus without complications: Secondary | ICD-10-CM | POA: Diagnosis not present

## 2019-05-02 DIAGNOSIS — Z7982 Long term (current) use of aspirin: Secondary | ICD-10-CM | POA: Diagnosis not present

## 2019-05-02 DIAGNOSIS — Z79899 Other long term (current) drug therapy: Secondary | ICD-10-CM | POA: Insufficient documentation

## 2019-05-02 DIAGNOSIS — R42 Dizziness and giddiness: Secondary | ICD-10-CM | POA: Diagnosis not present

## 2019-05-02 DIAGNOSIS — I10 Essential (primary) hypertension: Secondary | ICD-10-CM | POA: Insufficient documentation

## 2019-05-02 DIAGNOSIS — R0602 Shortness of breath: Secondary | ICD-10-CM | POA: Insufficient documentation

## 2019-05-02 DIAGNOSIS — Z96652 Presence of left artificial knee joint: Secondary | ICD-10-CM | POA: Diagnosis not present

## 2019-05-02 DIAGNOSIS — Z87891 Personal history of nicotine dependence: Secondary | ICD-10-CM | POA: Insufficient documentation

## 2019-05-02 LAB — COMPREHENSIVE METABOLIC PANEL
ALT: 18 U/L (ref 0–44)
AST: 24 U/L (ref 15–41)
Albumin: 3.9 g/dL (ref 3.5–5.0)
Alkaline Phosphatase: 69 U/L (ref 38–126)
Anion gap: 6 (ref 5–15)
BUN: 18 mg/dL (ref 8–23)
CO2: 28 mmol/L (ref 22–32)
Calcium: 8.7 mg/dL — ABNORMAL LOW (ref 8.9–10.3)
Chloride: 104 mmol/L (ref 98–111)
Creatinine, Ser: 1.02 mg/dL (ref 0.61–1.24)
GFR calc Af Amer: >60 mL/min (ref >60)
GFR calc non Af Amer: >60 mL/min (ref >60)
Glucose, Bld: 93 mg/dL (ref 70–99)
Potassium: 3.7 mmol/L (ref 3.5–5.1)
Sodium: 138 mmol/L (ref 135–145)
Total Bilirubin: 1.4 mg/dL — ABNORMAL HIGH (ref 0.3–1.2)
Total Protein: 6.9 g/dL (ref 6.5–8.1)

## 2019-05-02 LAB — URINALYSIS, ROUTINE W REFLEX MICROSCOPIC
Bilirubin Urine: NEGATIVE
Glucose, UA: NEGATIVE mg/dL
Hgb urine dipstick: NEGATIVE
Ketones, ur: NEGATIVE mg/dL
Leukocytes,Ua: NEGATIVE
Nitrite: NEGATIVE
Protein, ur: NEGATIVE mg/dL
Specific Gravity, Urine: 1.009 (ref 1.005–1.030)
pH: 6 (ref 5.0–8.0)

## 2019-05-02 LAB — CBC WITH DIFFERENTIAL/PLATELET
Abs Immature Granulocytes: 0.01 10*3/uL (ref 0.00–0.07)
Basophils Absolute: 0 10*3/uL (ref 0.0–0.1)
Basophils Relative: 1 %
Eosinophils Absolute: 0.2 10*3/uL (ref 0.0–0.5)
Eosinophils Relative: 3 %
HCT: 38.7 % — ABNORMAL LOW (ref 39.0–52.0)
Hemoglobin: 12.9 g/dL — ABNORMAL LOW (ref 13.0–17.0)
Immature Granulocytes: 0 %
Lymphocytes Relative: 27 %
Lymphs Abs: 1.7 10*3/uL (ref 0.7–4.0)
MCH: 30.4 pg (ref 26.0–34.0)
MCHC: 33.3 g/dL (ref 30.0–36.0)
MCV: 91.1 fL (ref 80.0–100.0)
Monocytes Absolute: 0.5 10*3/uL (ref 0.1–1.0)
Monocytes Relative: 8 %
Neutro Abs: 3.9 10*3/uL (ref 1.7–7.7)
Neutrophils Relative %: 61 %
Platelets: 176 10*3/uL (ref 150–400)
RBC: 4.25 MIL/uL (ref 4.22–5.81)
RDW: 14 % (ref 11.5–15.5)
WBC: 6.3 10*3/uL (ref 4.0–10.5)
nRBC: 0 % (ref 0.0–0.2)

## 2019-05-02 NOTE — ED Notes (Addendum)
Pt walked w/o assistance w/steady gait from EDRm 12 to restroom, returned to room w/out assistance w/steady gait...no c/o dizziness. Urine specimen and culture collected, labeled and sent to lab. [employer]

## 2019-05-02 NOTE — Discharge Instructions (Addendum)
Please follow up with Cardiology for possible holter monitoring You had a MRI of the brain today. Your neurologist has ordered this test as well so you do not have to get this test done again. Please follow up with Dr. Delice Lesch. Return if you are worsening

## 2019-05-02 NOTE — ED Triage Notes (Signed)
Pt reports intermittent episodes of dizziness over the last few days. Pt states that he has an episode of dizziness this morning that was worse than the last few episodes. Pt denies any headache, chest pain, or SHOB. Pt reports taking his BP medication today.

## 2019-05-02 NOTE — ED Notes (Signed)
PT will info staff when able to give urine sample

## 2019-05-02 NOTE — ED Notes (Signed)
Patient transported to CT 

## 2019-05-02 NOTE — ED Provider Notes (Signed)
[facility] DEPT Provider Note   CSN: [id number] Arrival date & time: 05/02/19  1322   History Chief Complaint  Patient presents with  . Dizziness    [place] L Richmond is a 79 y.o. male with history of DM, GERD, HTN, mild dementia, SVT who presents with dizziness. He states he felt well his morning. He took his morning meds and sat on the couch and he had a sudden onset of dizziness. He can't tell me if it is a spinning or lightheaded feeling. He denies increased dizziness with positional changes. It lasted about 3 minutes and resolved. He had some mild shortness of breath with it. He walked around and felt slightly dizzy but not as bad as when he was sitting. He denies headache, neck pain, chest pain, SOB, palpitations, syncope, current SOB, leg swelling. He states he has had several dizzy spells over the past couple months which prompted ED visits but states it feels different from those times.   HPI     Past Medical History:  Diagnosis Date  . Allergy   . Anal fissure 20 years ago  . Diabetes mellitus without complication ([place]) 0000000  . ED (erectile dysfunction)   . Essential hypertension 10/22/2006   Qualifier: Diagnosis of  By: Sarajane Jews MD, Ishmael Holter   . GERD (gastroesophageal reflux disease)   . Hard of hearing   . 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 OSA (obstructive sleep apnea)    per Dr. Gwenette Greet , cpap -2.0  . Osteoarthritis    Right knee and right thumb; s/p total knee replacement  . Pneumonia 2015    Patient Active Problem List   Diagnosis Date Noted  . Respiratory tract infection due to COVID-19 virus 02/13/2019  . Acute confusion 02/13/2019  . 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 Environmental and seasonal allergies 05/20/2018  . Postoperative stiffness of total knee replacement ([place]) 01/12/2014  . OA (osteoarthritis) of knee 10/26/2013  . Diabetes mellitus without complication ([place]) [id number]  . OSA (obstructive sleep  apnea) 07/21/2010  . Essential hypertension 10/22/2006  . GERD (gastroesophageal reflux disease) 10/22/2006    Past Surgical History:  Procedure Laterality Date  . ANAL FISSURE REPAIR    . colonoscopy  08/07/2016   per Dr. Hilarie Fredrickson, adenomatous polyps removed, repeat in 3 years   . KNEE ARTHROSCOPY     left knee  . KNEE CLOSED REDUCTION Left 01/13/2014   Procedure: CLOSED MANIPULATION LEFT KNEE;  Surgeon: Gearlean Alf, MD;  Location: [facility];  Service: Orthopedics;  Laterality: Left;  . SURGERY AGE 30 OR 7 ON LEFT KNEE FOR A "GLAND BEHIND THE KNEE"    . TOTAL KNEE ARTHROPLASTY Left 10/26/2013   Procedure: LEFT TOTAL KNEE ARTHROPLASTY;  Surgeon: Gearlean Alf, MD;  Location: [facility];  Service: Orthopedics;  Laterality: Left;       Family History  Problem Relation Age of Onset  . Breast cancer Sister   . Diabetes Sister   . Hypertension Sister   . Thyroid cancer Sister   . Diabetes Mother   . Hypertension Mother   . Diabetes Father   . Hypertension Father   . Diabetes Sister   . Hypertension Other   . Diabetes Brother   . Hypertension Brother   . Heart failure Brother   . Heart failure Brother   . Diabetes Sister   . Colon cancer Neg Hx   . Esophageal cancer Neg Hx   . Rectal cancer Neg Hx   .  Stomach cancer Neg Hx     Social History   Tobacco Use  . Smoking status: Former Smoker    Packs/day: 0.30    Years: 20.00    Pack years: 6.00    Types: Cigarettes    Quit date: 02/26/1977    Years since quitting: 42.2  . Smokeless tobacco: Former Systems developer    Types: Chew  Substance Use Topics  . Alcohol use: No    Alcohol/week: 0.0 standard drinks  . Drug use: No    Home Medications Prior to Admission medications   Medication Sig Start Date End Date Taking? Authorizing Provider  albuterol (VENTOLIN HFA) 108 (90 Base) MCG/ACT inhaler Inhale 2 puffs into the lungs every 6 (six) hours as needed for wheezing or shortness of breath. 02/11/18   [name], MD  amLODipine  (NORVASC) 5 MG tablet Take 1 tablet (5 mg total) by mouth daily. 01/05/19   [name], MD  ascorbic acid (VITAMIN C) 500 MG tablet Take 1 tablet (500 mg total) by mouth daily. 02/17/19   Caren Griffins, MD  aspirin EC 81 MG tablet Take 81 mg by mouth daily.    [provider]  fluticasone (FLONASE) 50 MCG/ACT nasal spray Place 2 sprays into both nostrils daily. Patient not taking: Reported on 04/10/2019 10/21/17   [name], MD  losartan-hydrochlorothiazide [facility]) 100-25 MG tablet TAKE 1 TABLET BY MOUTH ONCE DAILY IN THE MORNING 03/24/19   [name], MD  [facility]) 10 MG tablet Take 1 tablet by mouth twice daily 04/24/19   [name], MD  Multiple Vitamin (MULTIVITAMIN WITH MINERALS) TABS tablet Take 1 tablet by mouth daily.    [provider]  omeprazole (PRILOSEC) 40 MG capsule Take 1 capsule (40 mg total) by mouth daily. Patient not taking: Reported on 04/10/2019 03/23/19   [name], MD  pantoprazole (PROTONIX) 40 MG tablet Take 1 tablet (40 mg total) by mouth daily. 04/02/19   Levin Erp, PA  zinc sulfate 220 (50 Zn) MG capsule Take 1 capsule (220 mg total) by mouth daily. 02/17/19   Caren Griffins, MD  atenolol (TENORMIN) 50 MG tablet TAKE 1 TABLET EVERY DAY (NEED MD APPOINTMENT FOR REFILLS) Patient taking differently: Take 50 mg by mouth daily.  07/09/18 02/23/19  [name], MD    Allergies    Oxycodone and Tramadol  Review of Systems   Review of Systems  Constitutional: Negative for fever.  Respiratory: Positive for shortness of breath (resolved).   Cardiovascular: Negative for chest pain, palpitations and leg swelling.  Gastrointestinal: Negative for abdominal pain, nausea and vomiting.  Neurological: Positive for dizziness (resolved). Negative for syncope, weakness, light-headedness and headaches.  All other systems reviewed and are negative.   Physical Exam Updated Vital Signs BP (!) 120/95 (BP Location: Left  Arm)   Pulse (!) 57   Temp 97.7 F (36.5 C) (Oral)   Resp 13   SpO2 100%   Physical Exam Vitals and nursing note reviewed.  Constitutional:      General: He is not in acute distress.    Appearance: Normal appearance. He is well-developed. He is not ill-appearing.  HENT:     Head: Normocephalic and atraumatic.  Eyes:     General: No scleral icterus.       Right eye: No discharge.        Left eye: No discharge.     Conjunctiva/sclera: Conjunctivae normal.     Pupils:  Pupils are equal, round, and reactive to light.     Comments: Corneal arcus bilaterally   Cardiovascular:     Rate and Rhythm: Normal rate and regular rhythm.  Pulmonary:     Effort: Pulmonary effort is normal. No respiratory distress.     Breath sounds: Normal breath sounds.  Abdominal:     General: There is no distension.     Palpations: Abdomen is soft.     Tenderness: There is no abdominal tenderness.  Musculoskeletal:     Cervical back: Normal range of motion.     Right lower leg: No edema.     Left lower leg: No edema.  Skin:    General: Skin is warm and dry.  Neurological:     Mental Status: He is alert and oriented to person, place, and time.     Comments: Mental Status:  Alert, oriented, thought content appropriate, able to give a coherent history. Speech fluent without evidence of aphasia. Able to follow 2 step commands without difficulty.  Cranial Nerves:  II:  Peripheral visual fields grossly normal, pupils equal, round, reactive to light III,IV, VI: ptosis not present, extra-ocular motions intact bilaterally  V,VII: smile symmetric, facial light touch sensation equal VIII: hearing grossly normal to voice  X: uvula elevates symmetrically  XI: bilateral shoulder shrug symmetric and strong XII: midline tongue extension without fassiculations Motor:  Normal tone. 5/5 in upper and lower extremities bilaterally including strong and equal grip strength and dorsiflexion/plantar flexion Sensory:  Pinprick and light touch normal in all extremities.  Cerebellar: normal finger-to-nose with bilateral upper extremities Gait: normal gait and balance CV: distal pulses palpable throughout    Psychiatric:        Behavior: Behavior normal.     ED Results / Procedures / Treatments   Labs (all labs ordered are listed, but only abnormal results are displayed) Labs Reviewed  COMPREHENSIVE METABOLIC PANEL - Abnormal; Notable for the following components:      Result Value   Calcium 8.7 (*)    Total Bilirubin 1.4 (*)    All other components within normal limits  CBC WITH DIFFERENTIAL/PLATELET - Abnormal; Notable for the following components:   Hemoglobin 12.9 (*)    HCT 38.7 (*)    All other components within normal limits  URINALYSIS, ROUTINE W REFLEX MICROSCOPIC    EKG EKG Interpretation  Date/Time:  Saturday May 02 2019 14:40:32 EST Ventricular Rate:  58 PR Interval:    QRS Duration: 110 QT Interval:  409 QTC Calculation: 402 R Axis:   31 Text Interpretation: Sinus rhythm no acute st/ts Confirmed by Aletta Edouard [phone]) on 05/02/2019 2:46:57 PM   Radiology CT Head Wo Contrast  Result Date: 05/02/2019 CLINICAL DATA:  Dizziness for this past several days, worse with the last several episodes EXAM: CT HEAD WITHOUT CONTRAST TECHNIQUE: Contiguous axial images were obtained from the base of the skull through the vertex without intravenous contrast. COMPARISON:  None. FINDINGS: Brain: No evidence of acute infarction, hemorrhage, hydrocephalus, extra-axial collection or mass lesion/mass effect. Symmetric prominence of the ventricles, cisterns and sulci compatible with parenchymal volume loss. Enlarged retro cerebellar CSF attenuation space has an appearance more compatible with an arachnoid cyst band asymmetric volume loss or cerebellar atrophy given relatively compact appearance of the cerebellar folia. Patchy areas of white matter hypoattenuation are most compatible with chronic  microvascular angiopathy. Vascular: Atherosclerotic calcification of the carotid siphons and intradural vertebral arteries. No hyperdense vessel. Skull: No calvarial fracture, scalp swelling or hematoma.  Expanded appearance of the right sphenoid wing, nonspecific. Sinuses/Orbits: Paranasal sinuses and mastoid air cells are predominantly clear. Included orbital structures are unremarkable. Other: None IMPRESSION: No acute intracranial abnormality is seen. 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 Expanded appearance of the right sphenoid wing, such appearance can be seen with a meningioma. Extraosseous components may be difficult to visualize on CT imaging. Could consider further evaluation with MRI. These results were called by telephone at the time of interpretation on 05/02/2019 at 3:13 pm to provider [facility] , who verbally acknowledged these results. Electronically Signed   By: Lovena Le M.D.   On: 05/02/2019 15:13   MR BRAIN WO CONTRAST  Result Date: 05/02/2019 CLINICAL DATA:  Dizziness, nonspecific. EXAM: MRI HEAD WITHOUT CONTRAST TECHNIQUE: Multiplanar, multiecho pulse sequences of the brain and surrounding structures were obtained without intravenous contrast. COMPARISON:  Noncontrast head CT 05/02/2019. FINDINGS: Brain: The examination is motion degraded. Most notably there is moderate motion degradation of the axial diffusion-weighted imaging. There is no evidence of acute infarct. No evidence of intracranial mass. No midline shift or extra-axial fluid collection. No chronic intracranial blood products. No significant white matter disease for age. Moderate generalized cerebral atrophy. 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 Vascular: Flow voids maintained within the proximal large arterial  vessels. Skull and upper cervical spine: Redemonstrated expanded appearance of the right sphenoid wing, although the right sphenoid wing maintains normal T1 hyperintense marrow signal. Sinuses/Orbits: Bilateral lens replacements. Minimal scattered paranasal sinus mucosal thickening. No significant mastoid effusion IMPRESSION: Intermittently motion degraded examination. No evidence of acute intracranial abnormality, including acute infarction. Moderate generalized parenchymal atrophy. Nonspecific expanded appearance of the right sphenoid wing, although normal T1 hypointense marrow signal is maintained. 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 Electronically Signed   By: Kellie Simmering DO   On: 05/02/2019 17:32    Procedures Procedures (including critical care time)  Medications Ordered in ED Medications - No data to display  ED Course  I have reviewed the triage vital signs and the nursing notes.  Pertinent labs & imaging results that were available during my care of the patient were reviewed by me and considered in my medical decision making (see chart for details).  Clinical Course as of May 01 1509  Sat May 02, 6254  [id number] 79 year old male here with complaint of an episode of dizziness earlier today at home.  He cannot really explain whether it was lightheadedness or room spinning.  He said it lasted about 5 minutes and resolved.  Was not associated with any kind of neurologic symptom.  He feels back to baseline now.  Vitals unremarkable.  Nontoxic-appearing.  Getting lab work EKG head CT.   [MB]    Clinical Course User Index [MB] Hayden Rasmussen, MD   79 year old male presents with episode of vague dizziness today while sitting on the couch. He cannot tell me if it was spinning or feeling lightheaded. It just felt "different" than dizziness he's had before. BP is elevated at times here. Heart is regular rate and rhythm. EKG is SR. Lungs are CTA. Sats are  normal. Abdomen is soft and non-tender. His neurologic exam is grossly normal. Will obtain labs, CT head, orthostatics.  CBC shows mild anemia. CMP is overall reassuring other than mildly low Ca (8.7) and slightly elevated bilirubin (1.4).  UA is clear. Discussed with the radiologist about his head CT. He states that are multiple non-specific abnormalities and recommends getting MRI.   MRI is negative for any acute pathology. Discussed results with the patient. He continues to feel well. Recommend f/u with cardiology for possible holter monitoring to see if this could be the cause of his sudden dizziness. I discussed this with his wife as well. He was advised to return if worsening  MDM Rules/Calculators/A&P   Final Clinical Impression(s) / ED Diagnoses Final diagnoses:  Dizziness    Rx / DC Orders ED Discharge Orders    None       Recardo Evangelist, PA-C 05/03/19 1650    Hayden Rasmussen, MD 05/04/19 1026

## 2019-05-02 NOTE — ED Notes (Signed)
Gave wife update, will call her back with anything new at [phone]

## 2019-05-04 ENCOUNTER — Telehealth: Payer: Self-pay | Admitting: Family Medicine

## 2019-05-04 ENCOUNTER — Other Ambulatory Visit: Payer: Self-pay

## 2019-05-04 NOTE — Telephone Encounter (Signed)
Error

## 2019-05-05 ENCOUNTER — Ambulatory Visit (INDEPENDENT_AMBULATORY_CARE_PROVIDER_SITE_OTHER): Payer: Medicare Other | Admitting: Family Medicine

## 2019-05-05 ENCOUNTER — Encounter: Payer: Self-pay | Admitting: Family Medicine

## 2019-05-05 VITALS — BP 130/80 | HR 72 | Temp 97.7°F | Wt 181.8 lb

## 2019-05-05 DIAGNOSIS — R0602 Shortness of breath: Secondary | ICD-10-CM

## 2019-05-05 DIAGNOSIS — I10 Essential (primary) hypertension: Secondary | ICD-10-CM

## 2019-05-05 DIAGNOSIS — R42 Dizziness and giddiness: Secondary | ICD-10-CM | POA: Diagnosis not present

## 2019-05-05 NOTE — Progress Notes (Signed)
   Subjective:    Patient ID: Eric Richmond, male    DOB: 02-23-1941, 79 y.o.   MRN: [id number]  HPI Here to follow up on an ER visit on 05-02-19 for a 3-5 minute episode of "dizziness". This occurred mid morning while he was sitting on the couch. No chest pain or palpitations, but he felt mildly SOB during this spell. Then it quickly resolved. His wife took him to the ER where lab tests, EKG, head CT, and brain MRI were all unremarkable. Since then he has felt fine. It was suggested to him that he see a Cardiologist.    Review of Systems  Constitutional: Negative.   Respiratory: Positive for shortness of breath. Negative for cough, chest tightness and wheezing.   Cardiovascular: Negative.   Neurological: Positive for dizziness and light-headedness.       Objective:   Physical Exam Constitutional:      Appearance: Normal appearance. He is not ill-appearing.  Cardiovascular:     Rate and Rhythm: Normal rate and regular rhythm.     Pulses: Normal pulses.     Heart sounds: Normal heart sounds.  Pulmonary:     Effort: Pulmonary effort is normal.     Breath sounds: Normal breath sounds.  Neurological:     General: No focal deficit present.     Mental Status: He is alert and oriented to person, place, and time.           Assessment & Plan:  He had a spell of dizziness with SOB, an a cardiac arrhythmia is a likely explanation. He has a hx of SVT so that or atrial fibrillation could explain his symptoms. We will refer him to Cardiology to evaluate. Alysia Penna, MD

## 2019-05-22 ENCOUNTER — Other Ambulatory Visit: Payer: Self-pay | Admitting: Family Medicine

## 2019-06-02 ENCOUNTER — Encounter: Payer: Self-pay | Admitting: Family Medicine

## 2019-06-02 ENCOUNTER — Ambulatory Visit (INDEPENDENT_AMBULATORY_CARE_PROVIDER_SITE_OTHER): Payer: Medicare Other | Admitting: Family Medicine

## 2019-06-02 ENCOUNTER — Other Ambulatory Visit: Payer: Self-pay

## 2019-06-02 VITALS — BP 100/60 | HR 69 | Temp 97.7°F | Wt 185.0 lb

## 2019-06-02 DIAGNOSIS — I10 Essential (primary) hypertension: Secondary | ICD-10-CM

## 2019-06-02 MED ORDER — AMLODIPINE BESYLATE 5 MG PO TABS: 10.0000 mg | ORAL_TABLET | Freq: Every day | ORAL | 2 refills | Status: DC

## 2019-06-02 NOTE — Progress Notes (Signed)
   Subjective:    Patient ID: Eric Richmond, male    DOB: 1940-06-19, 79 y.o.   MRN: [id number]  HPI Here with his wife for high BP readings the past 2 mornings. Yesterday it was 154/96 and this morning it was 167/100. His heart rate is stable. Both mornings he has felt a little dizzy, but no headache or chest pain or SOB. He takes losartan HCT in the mornings and Amlodipine 5 mg at night. The normally only check his BP in the mornings.    Review of Systems  Constitutional: Negative.   Respiratory: Negative.   Cardiovascular: Negative.   Neurological: Positive for dizziness. Negative for headaches.       Objective:   Physical Exam Constitutional:      Appearance: Normal appearance.  Cardiovascular:     Rate and Rhythm: Normal rate and regular rhythm.     Pulses: Normal pulses.     Heart sounds: Normal heart sounds.  Pulmonary:     Effort: Pulmonary effort is normal.     Breath sounds: Normal breath sounds.  Musculoskeletal:     Right lower leg: No edema.     Left lower leg: No edema.  Neurological:     Mental Status: He is alert.           Assessment & Plan:  HTN, we will increase the Amlodipine to 2 pills (10 mg) each evening. I asked them to check his BP 4 times a day and to keep a log of these readings. He will see Dr. Tamala Julian in Cardiology tomorrow as well.  Alysia Penna, MD

## 2019-06-02 NOTE — Progress Notes (Addendum)
Cardiology Office Note:    Date:  06/03/2019   ID:  RAMELLO CORDIAL, DOB [birth year], MRN [id number]  PCP:  [name], MD  Cardiologist:  No primary care provider on file.   Referring MD: [name], MD   Chief Complaint  Patient presents with  . Irregular Heart Beat  . Dizziness  . Hypertension    History of Present Illness:    Eric Richmond is a 79 y.o. male with being seen with h/o of dizziness and SOB. Saw Dr. Oval Linsey 2016 for TWA on ECG.  Here for consultation from [name].  Has been having difficulty with "elevated blood pressures" and episodes of dizziness.  Blood pressures obtained during the "dizzy episodes" are not correlated with low blood pressure.  He denies chest pain, orthopnea, PND, syncope.  He has never fainted.  Concerning the dizziness, if he turns over in the bed, the dizziness will start.  The dizziness is a spinning sensation.  Relative to blood pressure.  It varies significantly.  The pressures are usually taken before he has a dose of the antihypertensive agent.  I explained to the wife that these blood pressures will be higher at that point than in 2 to 3 hours after the medication is taken.  I have advised that they should only take blood pressures at least 2 hours after a dose of the antihypertensive therapy.  This allows [place] to avoid overtreating and having post dose hypotension.  Past Medical History:  Diagnosis Date  . Allergy   . Anal fissure 20 years ago  . Diabetes mellitus without complication ([place]) 8/52/7782  . ED (erectile dysfunction)   . Essential hypertension 10/22/2006   Qualifier: Diagnosis of  By: Sarajane Jews MD, Ishmael Holter   . GERD (gastroesophageal reflux disease)   . Hard of hearing   . 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 OSA (obstructive sleep apnea)    per Dr. Gwenette Greet , cpap -2.0  . Osteoarthritis    Right knee and right thumb; s/p total knee replacement  . Pneumonia 2015    Past Surgical History:  Procedure Laterality  Date  . ANAL FISSURE REPAIR    . colonoscopy  08/07/2016   per Dr. Hilarie Fredrickson, adenomatous polyps removed, repeat in 3 years   . KNEE ARTHROSCOPY     left knee  . KNEE CLOSED REDUCTION Left 01/13/2014   Procedure: CLOSED MANIPULATION LEFT KNEE;  Surgeon: Gearlean Alf, MD;  Location: [facility];  Service: Orthopedics;  Laterality: Left;  . SURGERY AGE 61 OR 7 ON LEFT KNEE FOR A "GLAND BEHIND THE KNEE"    . TOTAL KNEE ARTHROPLASTY Left 10/26/2013   Procedure: LEFT TOTAL KNEE ARTHROPLASTY;  Surgeon: Gearlean Alf, MD;  Location: [facility];  Service: Orthopedics;  Laterality: Left;    Current Medications: Current Meds  Medication Sig  . albuterol (VENTOLIN HFA) 108 (90 Base) MCG/ACT inhaler Inhale 2 puffs into the lungs every 6 (six) hours as needed for wheezing or shortness of breath.  Marland Kitchen amLODipine (NORVASC) 5 MG tablet Take 2 tablets (10 mg total) by mouth daily.  Marland Kitchen ascorbic acid (VITAMIN C) 500 MG tablet Take 1 tablet (500 mg total) by mouth daily.  Marland Kitchen aspirin EC 81 MG tablet Take 81 mg by mouth daily.  . fluticasone (FLONASE) 50 MCG/ACT nasal spray Place 2 sprays into both nostrils daily.  Marland Kitchen losartan-hydrochlorothiazide (HYZAAR) 100-25 MG tablet TAKE 1 TABLET BY MOUTH ONCE DAILY IN THE MORNING  . memantine (NAMENDA)  10 MG tablet Take 1 tablet by mouth twice daily  . Multiple Vitamin (MULTIVITAMIN WITH MINERALS) TABS tablet Take 1 tablet by mouth daily.  . pantoprazole (PROTONIX) 40 MG tablet Take 1 tablet (40 mg total) by mouth daily.     Allergies:   Oxycodone and Tramadol   Social History   Socioeconomic History  . Marital status: Married    Spouse name: Not on file  . Number of children: Not on file  . Years of education: 52  . Highest education level: High school graduate  Occupational History  . Occupation: Fitter  Tobacco Use  . Smoking status: Former Smoker    Packs/day: 0.30    Years: 20.00    Pack years: 6.00    Types: Cigarettes    Quit date: 02/26/1977    Years since  quitting: 42.2  . Smokeless tobacco: Former Systems developer    Types: Chew  Substance and Sexual Activity  . Alcohol use: No    Alcohol/week: 0.0 standard drinks  . Drug use: No  . Sexual activity: Yes    Partners: Female  Other Topics Concern  . Not on file  Social History Narrative   Epworth Sleepiness Scale = 5 (12/14/2014)      Right handed      Graduated HS      Lives with wife   Social Determinants of Health   Financial Resource Strain:   . Difficulty of Paying Living Expenses:   Food Insecurity: No Food Insecurity  . Worried About Charity fundraiser in the Last Year: Never true  . Ran Out of Food in the Last Year: Never true  Transportation Needs: No Transportation Needs  . Lack of Transportation (Medical): No  . Lack of Transportation (Non-Medical): No  Physical Activity:   . Days of Exercise per Week:   . Minutes of Exercise per Session:   Stress:   . Feeling of Stress :   Social Connections:   . Frequency of Communication with Friends and Family:   . Frequency of Social Gatherings with Friends and Family:   . Attends Religious Services:   . Active Member of Clubs or Organizations:   . Attends Archivist Meetings:   Marland Kitchen Marital Status:      Family History: The patient's family history includes Breast cancer in his sister; Diabetes in his brother, father, mother, sister, sister, and sister; Heart failure in his brother and brother; Hypertension in his brother, father, mother, sister, and another family member; Thyroid cancer in his sister. There is no history of Colon cancer, Esophageal cancer, Rectal cancer, or Stomach cancer.  ROS:   Please see the history of present illness.    Patient does not give any particular information.  He does describe his dizziness as a spinning sensation.  He volunteered that if he flips over in bed a lot at times it feels that the bed is spinning.  All other systems reviewed and are negative.  EKGs/Labs/Other Studies Reviewed:      The following studies were reviewed today: 2016 myocardial perfusion study: Study Highlights   The left ventricular ejection fraction is mildly decreased (45-54%).  Nuclear stress EF: 53%.  Blood pressure demonstrated a hypertensive response in recovery.  There was no ST segment deviation noted during stress.  This is a low risk study.   Low risk exercise nuclear study with the patient exercising to a 9.5 met workload and peak heart rate of 126 (86% APMHR). Myocardial perfusion is normal  although mild diaphragmatic attenuation is present. (Extent 0; TPD 1%). EF 53% without wall motion abnormality. The patient became hypertensive in recovery to 219/114.      EKG:  EKG performed on May 04, 2019 demonstrates sinus bradycardia with normal appearance.  Recent Labs: 12/15/2018: TSH 1.16 05/02/2019: ALT 18; BUN 18; Creatinine, Ser 1.02; Hemoglobin 12.9; Platelets 176; Potassium 3.7; Sodium 138  Recent Lipid Panel    Component Value Date/Time   CHOL 135 10/21/2017 0922   TRIG 76 02/12/2019 2051   HDL 51.80 10/21/2017 0922   CHOLHDL 3 10/21/2017 0922   VLDL 10.8 10/21/2017 0922   LDLCALC 72 10/21/2017 0922    Physical Exam:    VS:  BP 116/70   Pulse 71   Ht _0  (1.727 m)   Wt 186 lb 6.4 oz (84.6 kg)   SpO2 98%   BMI 28.34 kg/m     Wt Readings from Last 3 Encounters:  06/03/19 186 lb 6.4 oz (84.6 kg)  06/02/19 185 lb (83.9 kg)  05/05/19 181 lb 12.8 oz (82.5 kg)     GEN: Slender and healthy appearing. No acute distress HEENT: Normal NECK: No JVD. LYMPHATICS: No lymphadenopathy CARDIAC:  RRR without murmur, gallop, or edema. VASCULAR:  Normal Pulses. No bruits. RESPIRATORY:  Clear to auscultation without rales, wheezing or rhonchi  ABDOMEN: Soft, non-tender, non-distended, No pulsatile mass, MUSCULOSKELETAL: No deformity  SKIN: Warm and dry NEUROLOGIC:  Alert and oriented x 3 PSYCHIATRIC:  Normal affect   ASSESSMENT:    1. Dizziness   2. Essential  hypertension   3. OSA (obstructive sleep apnea)   4. Diabetes mellitus without complication ([place])   5. Educated about COVID-19 virus infection    PLAN:    In order of problems listed above:  1. Most likely vertigo and no relationship to blood pressure has been identified 2. Blood pressure goal is 130/80 mmHg.  Advised to not take blood pressure until at least 2 hours after a dose of his antihypertensive therapy.  Current therapy includes amlodipine in the morning (recently increased to 10 mg/day) and losartan HCT in the p.m.  I have warned her not to expect [place] to react to elevated blood pressures that are predose (a.m. or p.m).  We need blood pressures at least 2 hours after antihypertensive therapy administration.  They need to be careful on 10 mg of amlodipine that the blood pressure does not get too low in the mid to late afternoon and to watch for peripheral edema.. 3. We did not discuss sleep apnea. 4. A1c less than 7 is desired. 5. COVID-19 vaccine is been received.  I do not see a particular cardiac issue, other than perhaps measuring the blood pressure at the wrong time and making false assumption about BP relationship to complaints..  The main goal is going to be controlling the blood pressure.  I believe she is taking the blood pressure too often.  First thing in the morning blood pressures before medications are not  useful in determining therapy adjustments..  Vertigo needs evaluation and therapy.  His dizziness is not vascular related I do not think.   Medication Adjustments/Labs and Tests Ordered: Current medicines are reviewed at length with the patient today.  Concerns regarding medicines are outlined above.  No orders of the defined types were placed in this encounter.  No orders of the defined types were placed in this encounter.   Patient Instructions  Medication Instructions:  Your physician recommends that you continue on  your current medications as directed. Please  refer to the Current Medication list given to you today.  *If you need a refill on your cardiac medications before your next appointment, please call your pharmacy*   Lab Work: None If you have labs (blood work) drawn today and your tests are completely normal, you will receive your results only by: Marland Kitchen MyChart Message (if you have MyChart) OR . A paper copy in the mail If you have any lab test that is abnormal or we need to change your treatment, we will call you to review the results.   Testing/Procedures: None   Follow-Up: At [facility], you and your health needs are our priority.  As part of our continuing mission to provide you with exceptional heart care, we have created designated Provider Care Teams.  These Care Teams include your primary Cardiologist (physician) and Advanced Practice Providers (APPs -  Physician Assistants and Nurse Practitioners) who all work together to provide you with the care you need, when you need it.  We recommend signing up for the patient portal called "MyChart".  Sign up information is provided on this After Visit Summary.  MyChart is used to connect with patients for Virtual Visits (Telemedicine).  Patients are able to view lab/test results, encounter notes, upcoming appointments, etc.  Non-urgent messages can be sent to your provider as well.   To learn more about what you can do with MyChart, go to [email or web address].    Your next appointment:   As needed  The format for your next appointment:   In Person  Provider:   You may see Dr. Daneen Schick or one of the following Advanced Practice Providers on your designated Care Team:    Truitt Merle, NP  Cecilie Kicks, NP  Kathyrn Drown, NP    Other Instructions  When you check your blood pressure, make sure it is at least 2 hours after blood pressure medications. Blood pressure should be around 130/80. Contact [name] if you are seeing the top number consistently below 110.  Talk to  [name] about vertigo.     Signed, Sinclair Grooms, MD  06/03/2019 11:01 AM    [place]

## 2019-06-03 ENCOUNTER — Ambulatory Visit: Payer: Medicare Other | Admitting: Interventional Cardiology

## 2019-06-03 ENCOUNTER — Encounter: Payer: Self-pay | Admitting: Interventional Cardiology

## 2019-06-03 VITALS — BP 116/70 | HR 71 | Ht 68.0 in | Wt 186.4 lb

## 2019-06-03 DIAGNOSIS — E119 Type 2 diabetes mellitus without complications: Secondary | ICD-10-CM

## 2019-06-03 DIAGNOSIS — I10 Essential (primary) hypertension: Secondary | ICD-10-CM | POA: Diagnosis not present

## 2019-06-03 DIAGNOSIS — G4733 Obstructive sleep apnea (adult) (pediatric): Secondary | ICD-10-CM | POA: Diagnosis not present

## 2019-06-03 DIAGNOSIS — Z7189 Other specified counseling: Secondary | ICD-10-CM | POA: Diagnosis not present

## 2019-06-03 DIAGNOSIS — R42 Dizziness and giddiness: Secondary | ICD-10-CM | POA: Diagnosis not present

## 2019-06-03 NOTE — Patient Instructions (Addendum)
Medication Instructions:  Your physician recommends that you continue on your current medications as directed. Please refer to the Current Medication list given to you today.  *If you need a refill on your cardiac medications before your next appointment, please call your pharmacy*   Lab Work: None If you have labs (blood work) drawn today and your tests are completely normal, you will receive your results only by: Marland Kitchen MyChart Message (if you have MyChart) OR . A paper copy in the mail If you have any lab test that is abnormal or we need to change your treatment, we will call you to review the results.   Testing/Procedures: None   Follow-Up: At [facility], you and your health needs are our priority.  As part of our continuing mission to provide you with exceptional heart care, we have created designated Provider Care Teams.  These Care Teams include your primary Cardiologist (physician) and Advanced Practice Providers (APPs -  Physician Assistants and Nurse Practitioners) who all work together to provide you with the care you need, when you need it.  We recommend signing up for the patient portal called "MyChart".  Sign up information is provided on this After Visit Summary.  MyChart is used to connect with patients for Virtual Visits (Telemedicine).  Patients are able to view lab/test results, encounter notes, upcoming appointments, etc.  Non-urgent messages can be sent to your provider as well.   To learn more about what you can do with MyChart, go to [email or web address].    Your next appointment:   As needed  The format for your next appointment:   In Person  Provider:   You may see Dr. Daneen Schick or one of the following Advanced Practice Providers on your designated Care Team:    Truitt Merle, NP  Cecilie Kicks, NP  Kathyrn Drown, NP    Other Instructions  When you check your blood pressure, make sure it is at least 2 hours after blood pressure medications. Blood  pressure should be around 130/80. Contact [name] if you are seeing the top number consistently below 110.  Talk to [name] about vertigo.

## 2019-06-04 ENCOUNTER — Telehealth: Payer: Self-pay | Admitting: Family Medicine

## 2019-06-04 NOTE — Telephone Encounter (Signed)
[name], Estill Batten, would like to speak to you regarding pt's blood pressure. Pt was seen a few days ago and is requesting a call back. Thanks

## 2019-06-05 ENCOUNTER — Other Ambulatory Visit: Payer: Self-pay

## 2019-06-05 NOTE — Telephone Encounter (Signed)
Left message for patient to call back. Please see what questions she has when she calls back.

## 2019-06-05 NOTE — Patient Outreach (Signed)
[facility]) Care Management  [place]  06/05/2019   Eric Richmond [birth year] [id number]  Subjective: Telephone call to wife Eric Richmond for follow up call to nurse line.  She states she had some problems getting the doctor on the line for patient continued dizziness.  She states that she called the office and patient has an appointment on Monday at 8:30 am to address the dizziness.  She states that patient saw the cardiologist this week and he did not think it was blood pressure related and advised that it could possibly be vertigo.  Discussed vertigo and possible inner ear problems. Discussed fall precautions and if dizziness gets worse to seek medical attention.  She verbalized understanding. Patient noted to have [facility] now. Wife states that yes they switched.  Advised her that CM would be transferring them to health coach for further disease management.  She is agreeable and verbalized understanding.    Objective:   Encounter Medications:  Outpatient Encounter Medications as of 06/05/2019  Medication Sig  . albuterol (VENTOLIN HFA) 108 (90 Base) MCG/ACT inhaler Inhale 2 puffs into the lungs every 6 (six) hours as needed for wheezing or shortness of breath.  Marland Kitchen amLODipine (NORVASC) 5 MG tablet Take 2 tablets (10 mg total) by mouth daily.  Marland Kitchen ascorbic acid (VITAMIN C) 500 MG tablet Take 1 tablet (500 mg total) by mouth daily.  Marland Kitchen aspirin EC 81 MG tablet Take 81 mg by mouth daily.  . fluticasone (FLONASE) 50 MCG/ACT nasal spray Place 2 sprays into both nostrils daily.  Marland Kitchen losartan-hydrochlorothiazide (HYZAAR) 100-25 MG tablet TAKE 1 TABLET BY MOUTH ONCE DAILY IN THE MORNING  . memantine (NAMENDA) 10 MG tablet Take 1 tablet by mouth twice daily  . Multiple Vitamin (MULTIVITAMIN WITH MINERALS) TABS tablet Take 1 tablet by mouth daily.  . pantoprazole (PROTONIX) 40 MG tablet Take 1 tablet (40 mg total) by mouth daily.  . [DISCONTINUED] atenolol (TENORMIN) 50 MG  tablet TAKE 1 TABLET EVERY DAY (NEED MD APPOINTMENT FOR REFILLS) (Patient taking differently: Take 50 mg by mouth daily. )   No facility-administered encounter medications on file as of 06/05/2019.    Functional Status:  In your present state of health, do you have any difficulty performing the following activities: 02/23/2019 02/13/2019  Hearing? Tempie Donning  Comment has hearing aids -  Vision? N N  Difficulty concentrating or making decisions? N Y  Walking or climbing stairs? N N  Dressing or bathing? N N  Doing errands, shopping? Tempie Donning  Comment wife takes -  Conservation officer, nature and eating ? Y -  Using the Toilet? N -  In the past six months, have you accidently leaked urine? N -  Do you have problems with loss of bowel control? N -  Managing your Medications? Y -  Comment wife completes medications -  Managing your Finances? Y -  Comment Wife does -  Horticulturist, commercial your Housekeeping? Y -  Comment wife handles -  Some recent data might be hidden    Fall/Depression Screening: Fall Risk  06/05/2019 03/27/2019 02/23/2019  Falls in the past year? 0 0 0  Number falls in past yr: - - -  Injury with Fall? - - -  Follow up - - -   PHQ 2/9 Scores 02/23/2019 08/13/2017 08/09/2016 11/18/2014  PHQ - 2 Score 0 0 1 0    Assessment: Patient having issues with dizziness.  This has been addressed with PCP and cardiologist.  Patient has follow upwith PCP on 06-08-19.     Plan:  [facility] CM Care Plan Problem One     Most Recent Value  Care Plan Problem One  Recent Hospitalization related to COVIS19  Role Documenting the Problem One  Care Management Telephonic [place] for Problem One  Active  [facility]N Long Term Goal   Over the next 90 days, patient will demonstrate and/or verbalize understanding of self-health management for HTN.   THN Long Term Goal Start Date  06/05/19  Interventions for Problem One Long Term Goal  RN CM reviewed with patient importance medication adherence, low salt diet and  exercise.     RN CM will send patient to health coach for continued disease management.    Jone Baseman, RN, MSN [place] Management Care Management Coordinator Direct Line [phone] Cell [phone] Toll Free: [phone]  Fax: [phone]

## 2019-06-08 ENCOUNTER — Other Ambulatory Visit: Payer: Self-pay | Admitting: *Deleted

## 2019-06-08 ENCOUNTER — Ambulatory Visit: Payer: Medicare Other | Admitting: Family Medicine

## 2019-06-08 ENCOUNTER — Encounter: Payer: Self-pay | Admitting: Family Medicine

## 2019-06-08 ENCOUNTER — Other Ambulatory Visit: Payer: Self-pay

## 2019-06-08 ENCOUNTER — Ambulatory Visit (INDEPENDENT_AMBULATORY_CARE_PROVIDER_SITE_OTHER): Payer: Medicare Other | Admitting: Family Medicine

## 2019-06-08 VITALS — BP 124/60 | HR 65 | Temp 98.0°F | Wt 183.0 lb

## 2019-06-08 DIAGNOSIS — R42 Dizziness and giddiness: Secondary | ICD-10-CM | POA: Insufficient documentation

## 2019-06-08 DIAGNOSIS — I10 Essential (primary) hypertension: Secondary | ICD-10-CM | POA: Diagnosis not present

## 2019-06-08 MED ORDER — AMLODIPINE BESYLATE 5 MG PO TABS: 5.0000 mg | ORAL_TABLET | Freq: Every day | ORAL | 2 refills | Status: DC

## 2019-06-08 MED ORDER — MECLIZINE HCL 25 MG PO TABS: 25.0000 mg | ORAL_TABLET | ORAL | 3 refills | Status: DC | PRN

## 2019-06-08 NOTE — Progress Notes (Signed)
   Subjective:    Patient ID: Eric Richmond, male    DOB: 12-07-1940, 79 y.o.   MRN: [id number]  HPI Here to discuss dizziness again. Today he describes a more classic situation of vertigo. These spells are always worst when he is lying in bed at night. When he turns over to change position, he suddenly feels like the bed is spinning around him. No headache or nausea. These spells last 5-10 minutes and they go away if he lies still. He has milder episodes of this at times during the day. His BP has been quite stable at home since we moved his Amlodipine to the mornings. He has changed the dose back to as ingle 5 mg pill and it still works well.    Review of Systems  Constitutional: Negative.   Respiratory: Negative.   Cardiovascular: Negative.   Neurological: Positive for dizziness.       Objective:   Physical Exam Constitutional:      Appearance: Normal appearance.  Cardiovascular:     Rate and Rhythm: Normal rate and regular rhythm.     Pulses: Normal pulses.     Heart sounds: Normal heart sounds.  Pulmonary:     Effort: Pulmonary effort is normal.     Breath sounds: Normal breath sounds.  Neurological:     General: No focal deficit present.     Mental Status: He is alert and oriented to person, place, and time.     Coordination: Coordination normal.     Gait: Gait normal.           Assessment & Plan:  The dizziness sounds like vertigo. He will try Meclizine as needed. For the HTN we will keep the current meds the same. Alysia Penna, MD

## 2019-06-11 ENCOUNTER — Other Ambulatory Visit: Payer: Self-pay | Admitting: *Deleted

## 2019-06-11 ENCOUNTER — Encounter: Payer: Self-pay | Admitting: *Deleted

## 2019-06-11 NOTE — Patient Outreach (Signed)
[facility]) Care Management  [place]  06/11/2019   Eric Richmond 1940/08/27 [id number]  Subjective: Successful telephone outreach call to patient's wife Reason Blan. HIPAA identifiers obtained. Wife states that patient had a recent visit with cardiologist on 06/03/19 and Dr. Tamala Julian did not feel patient's dizziness was associated with his heart. Patient saw [name] 06/08/19 and PCP thought dizziness was coming from vertigo. [name] stated that patient's dizziness has improved since he started taking meclizine as needed. Per wife the patient is independent with ADLs and stays physically active by doing yard work and walking several times weekly. She denies that patient has had any falls despite his dizziness and reports patient's gait is steady without the use of any ambulatory devices. Wife explained that their home environment is safe and emotionally patient seems to be doing well at this time. Per wife, patient's B/P has been good; she has not taking it this morning but states his reading at PCP's office was 124/60. Wife states she is doing her best to follow a low sodium diet for the patient. We discussed ensuring that patient stays hydrated and drinks plenty of water. [name] explained that patient has a good appetite and he is maintaining his weight. [name] voiced no other concerns at this time and reports relief that patient's dizziness is improving.    Encounter Medications:  Outpatient Encounter Medications as of 06/11/2019  Medication Sig  . albuterol (VENTOLIN HFA) 108 (90 Base) MCG/ACT inhaler Inhale 2 puffs into the lungs every 6 (six) hours as needed for wheezing or shortness of breath.  Marland Kitchen amLODipine (NORVASC) 5 MG tablet Take 1 tablet (5 mg total) by mouth daily.  Marland Kitchen ascorbic acid (VITAMIN C) 500 MG tablet Take 1 tablet (500 mg total) by mouth daily.  Marland Kitchen aspirin EC 81 MG tablet Take 81 mg by mouth daily.  . fluticasone (FLONASE) 50 MCG/ACT nasal spray  Place 2 sprays into both nostrils daily.  Marland Kitchen losartan-hydrochlorothiazide (HYZAAR) 100-25 MG tablet TAKE 1 TABLET BY MOUTH ONCE DAILY IN THE MORNING  . meclizine (ANTIVERT) 25 MG tablet Take 1 tablet (25 mg total) by mouth every 4 (four) hours as needed for dizziness.  . memantine (NAMENDA) 10 MG tablet Take 1 tablet by mouth twice daily  . Multiple Vitamin (MULTIVITAMIN WITH MINERALS) TABS tablet Take 1 tablet by mouth daily.  . pantoprazole (PROTONIX) 40 MG tablet Take 1 tablet (40 mg total) by mouth daily.  . [DISCONTINUED] atenolol (TENORMIN) 50 MG tablet TAKE 1 TABLET EVERY DAY (NEED MD APPOINTMENT FOR REFILLS) (Patient taking differently: Take 50 mg by mouth daily. )   No facility-administered encounter medications on file as of 06/11/2019.    Functional Status:  In your present state of health, do you have any difficulty performing the following activities: 06/11/2019 02/23/2019  Hearing? Tempie Donning  Comment has hearing aids has hearing aids  Vision? N N  Difficulty concentrating or making decisions? Y N  Comment diagnosis of dementia -  Walking or climbing stairs? N N  Dressing or bathing? N N  Doing errands, shopping? Y Y  Comment - wife takes  Conservation officer, nature and eating ? Y Y  Using the Toilet? N N  In the past six months, have you accidently leaked urine? N N  Do you have problems with loss of bowel control? N N  Managing your Medications? Y Y  Comment - wife completes medications  Managing your Finances? Tempie Donning  Comment -  Wife does  Housekeeping or managing your Housekeeping? N Y  Comment - wife handles  Some recent data might be hidden    Fall/Depression Screening: Fall Risk  06/11/2019 06/05/2019 03/27/2019  Falls in the past year? 0 0 0  Number falls in past yr: 0 - -  Injury with Fall? 0 - -  Follow up Falls prevention discussed;Education provided;Falls evaluation completed - -   PHQ 2/9 Scores 06/11/2019 02/23/2019 08/13/2017 08/09/2016 11/18/2014  PHQ - 2 Score 0 0 0 1 0    [facility] CM Care Plan Problem One     Most Recent Value  Care Plan Problem One  Knowledge deficient related to hypertension condition, treatment plan, and lifestyle changes.  Role Documenting the Problem One  [place] for Problem One  Active  [facility] Long Term Goal   Patient will maintain B/P within normal range of systolic < [id number] within the next 90 days  [facility] Long Term Goal Start Date  06/11/19  Interventions for Problem One Long Term Goal  RN discussed the importance of taking B/P daily and recording the data, reviewed recommended sodium limits for hypertension, encouraged medication adherence, sent a matter of choices blood pressure control booklet, encouraged continuation of physical activity and exercise      Plan: Nurse Health Coach will send Barrier Letter and assessment note to PCP, will send hypertension education and a medical life alert resource to [name]. RN Health Coach will call patient within the month of May and patient's wife agrees to future outreach calls.   [name] RN, BSN [place] [phone] [email or web address]

## 2019-06-12 ENCOUNTER — Ambulatory Visit: Payer: Self-pay

## 2019-06-12 NOTE — Telephone Encounter (Signed)
Left message for patient to call back  

## 2019-06-16 NOTE — Telephone Encounter (Signed)
Left message for patient to call back  

## 2019-06-18 ENCOUNTER — Telehealth: Payer: Self-pay | Admitting: Family Medicine

## 2019-06-18 NOTE — Telephone Encounter (Signed)
Unable to reach patient. Message will be closed. 

## 2019-06-18 NOTE — Telephone Encounter (Signed)
Form was faxed this morning.

## 2019-06-18 NOTE — Telephone Encounter (Signed)
Pt's spouse stated that the pt is having a tooth ache and their dentist office faxed over a clearance in order for them to extract the tooth. [facility] is [facility]. The office told the pt that they have not received the form back yet and the pt has an appt today at 3:00pm.   Phone: [phone]   Informed pt that [name] is out of the office this week and will return Monday.   Pt is wondering if another PCP can sign off because the pt is in pain if [name] did not receive the clearance?   Pt can be reached at [phone]  Pt would like to know if this will be sent today and needs a call before 2pm since his appt is at 3pm

## 2019-06-22 ENCOUNTER — Other Ambulatory Visit: Payer: Self-pay | Admitting: Family Medicine

## 2019-07-08 ENCOUNTER — Other Ambulatory Visit: Payer: Self-pay

## 2019-07-08 ENCOUNTER — Encounter: Payer: Self-pay | Admitting: Family Medicine

## 2019-07-08 ENCOUNTER — Ambulatory Visit (INDEPENDENT_AMBULATORY_CARE_PROVIDER_SITE_OTHER): Payer: Medicare Other | Admitting: Family Medicine

## 2019-07-08 VITALS — BP 128/66 | HR 62 | Temp 98.3°F | Wt 184.8 lb

## 2019-07-08 DIAGNOSIS — R590 Localized enlarged lymph nodes: Secondary | ICD-10-CM

## 2019-07-08 NOTE — Progress Notes (Signed)
   Subjective:    Patient ID: Eric Richmond, male    DOB: 10/06/1940, 79 y.o.   MRN: [id number]  HPI Here for swollen tender lymph nodes in the right neck area for the past 6 weeks. No fever or sinus congestion or ST. He had several teeth removed 2 weeks ago on both sides of his mouth, and the dentist gave him 10 days of Amoxicillin around that time. He quit smoking many years ago but he still chews tobacco.    Review of Systems  Constitutional: Negative.   HENT: Negative.   Eyes: Negative.   Respiratory: Negative.   Hematological: Positive for adenopathy.       Objective:   Physical Exam Constitutional:      Appearance: Normal appearance. He is not ill-appearing.  HENT:     Right Ear: Tympanic membrane, ear canal and external ear normal.     Left Ear: Tympanic membrane, ear canal and external ear normal.     Nose: Nose normal.     Mouth/Throat:     Mouth: Mucous membranes are moist.     Pharynx: Oropharynx is clear. No oropharyngeal exudate or posterior oropharyngeal erythema.  Eyes:     Conjunctiva/sclera: Conjunctivae normal.  Neck:     Comments: Several mildly enlarged tender lymph nodes in the right AC area  Cardiovascular:     Rate and Rhythm: Normal rate and regular rhythm.     Pulses: Normal pulses.     Heart sounds: Normal heart sounds.  Pulmonary:     Effort: Pulmonary effort is normal.     Breath sounds: Normal breath sounds.  Musculoskeletal:     Cervical back: No rigidity.  Neurological:     Mental Status: He is alert.           Assessment & Plan:  Cervical adenopathy of uncertain etiology. Refer to ENT to evaluate further.  Alysia Penna, MD

## 2019-07-09 ENCOUNTER — Ambulatory Visit: Payer: Medicare Other | Admitting: Neurology

## 2019-07-09 ENCOUNTER — Encounter: Payer: Self-pay | Admitting: Neurology

## 2019-07-09 VITALS — BP 131/82 | HR 65 | Ht 68.0 in | Wt 186.8 lb

## 2019-07-09 DIAGNOSIS — G3184 Mild cognitive impairment, so stated: Secondary | ICD-10-CM | POA: Diagnosis not present

## 2019-07-09 DIAGNOSIS — R42 Dizziness and giddiness: Secondary | ICD-10-CM | POA: Diagnosis not present

## 2019-07-09 DIAGNOSIS — R9089 Other abnormal findings on diagnostic imaging of central nervous system: Secondary | ICD-10-CM

## 2019-07-09 MED ORDER — MEMANTINE HCL 10 MG PO TABS: 10.0000 mg | ORAL_TABLET | Freq: Two times a day (BID) | ORAL | 11 refills | Status: DC

## 2019-07-09 MED ORDER — ESCITALOPRAM OXALATE 10 MG PO TABS: 10.0000 mg | ORAL_TABLET | Freq: Every day | ORAL | 11 refills | Status: DC

## 2019-07-09 NOTE — Patient Instructions (Signed)
1. Schedule MRI brain with contrast  2. For the dizziness, we will send referral to Vestibular Therapy  3. Start Lexapro 10mg  every night  4. Continue Memantine 10mg  twice a day  5. Driving evaluation is recommended, or have your wife do the driving. Contact [employer] in [place] or [employer] [phone].  6. Follow-up in 6 months, call for any changes  FALL PRECAUTIONS: Be cautious when walking. Scan the area for obstacles that may increase the risk of trips and falls. When getting up in the mornings, sit up at the edge of the bed for a few minutes before getting out of bed. Consider elevating the bed at the head end to avoid drop of blood pressure when getting up. Walk always in a well-lit room (use night lights in the walls). Avoid area rugs or power cords from appliances in the middle of the walkways. Use a walker or a cane if necessary and consider physical therapy for balance exercise. Get your eyesight checked regularly.  FINANCIAL OVERSIGHT: Supervision, especially oversight when making financial decisions or transactions is also recommended.  HOME SAFETY: Consider the safety of the kitchen when operating appliances like stoves, microwave oven, and blender. Consider having supervision and share cooking responsibilities until no longer able to participate in those. Accidents with firearms and other hazards in the house should be identified and addressed as well.  DRIVING: Regarding driving, in patients with progressive memory problems, driving will be impaired. We advise to have someone else do the driving if trouble finding directions or if minor accidents are reported. Independent driving assessment is available to determine safety of driving.  ABILITY TO BE LEFT ALONE: If patient is unable to contact 911 operator, consider using LifeLine, or when the need is there, arrange for someone to stay with patients. Smoking is a fire hazard,  consider supervision or cessation. Risk of wandering should be assessed by caregiver and if detected at any point, supervision and safe proof recommendations should be instituted.  MEDICATION SUPERVISION: Inability to self-administer medication needs to be constantly addressed. Implement a mechanism to ensure safe administration of the medications.  RECOMMENDATIONS FOR ALL PATIENTS WITH MEMORY PROBLEMS: 1. Continue to exercise (Recommend 30 minutes of walking everyday, or 3 hours every week) 2. Increase social interactions - continue going to [place] and enjoy social gatherings with friends and family 3. Eat healthy, avoid fried foods and eat more fruits and vegetables 4. Maintain adequate blood pressure, blood sugar, and blood cholesterol level. Reducing the risk of stroke and cardiovascular disease also helps promoting better memory. 5. Avoid stressful situations. Live a simple life and avoid aggravations. Organize your time and prepare for the next day in anticipation. 6. Sleep well, avoid any interruptions of sleep and avoid any distractions in the bedroom that may interfere with adequate sleep quality 7. Avoid sugar, avoid sweets as there is a strong link between excessive sugar intake, diabetes, and cognitive impairment The Mediterranean diet has been shown to help patients reduce the risk of progressive memory disorders and reduces cardiovascular risk. This includes eating fish, eat fruits and green leafy vegetables, nuts like almonds and hazelnuts, walnuts, and also use olive oil. Avoid fast foods and fried foods as much as possible. Avoid sweets and sugar as sugar use has been linked to worsening of memory function.  There is always a concern of gradual progression of memory problems. If this is the case, then we may need to adjust level of care according to  patient needs. Support, both to the patient and caregiver, should then be put into place.

## 2019-07-09 NOTE — Progress Notes (Signed)
NEUROLOGY FOLLOW UP OFFICE NOTE  Eric Richmond [id number] March 05, 1940  HISTORY OF PRESENT ILLNESS: I had the pleasure of seeing Eric Richmond in follow-up in the neurology clinic on 07/09/2019.  The patient was last seen 7 months ago for memory loss. He is again accompanied by his wife who helps supplement the history today.  Records and images were personally reviewed where available.  He underwent Neuropsychological testing in 12/2018 which showed prominent impairments surrounding executive functioning, verbal learning and memory, and confrontation naming. There was also performance variability across processing speed, attention/concentration, receptive language, and visuospatial/constructional abilities. 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 He is on Memantine 10mg  BID without side effects, he had side effects on Donepezil.  Since his last visit, he contracted COVID in December 2020. His wife reports that he was very confused at that time and did not recognize his wife or know where he was. He had brain fog for a time, his wife thinks he is better, memory is not terrible and back to baseline pre-COVID. He has not been driving as much since his hospital admission in December, he occasionally misses a turn. His wife fixes his pillbox and makes sure he takes them. She manages finances. She notes he gets upset a lot with his wife when she is trying to help him. She continues to report symptoms suggestive of REM behavior disorder, he dreams a lot and fights/punches in his sleep. No tremors. No visual hallucinations. He denies any headaches, vision changes, no falls. He was in the ER for dizziness in March 2021, he continues to have episodes of dizziness that appear positional, although difficult to clearly determine as patient is a poor historian. 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 There was note of expansile appearance of the right sphenoid wing, indeterminate etiology, differentials include Page's disease or intraosseous meningioma. Contrast-enhanced MRI brain was recommended but has not yet been done.   History on Initial Assessment 04/25/2018: This is a very pleasant 79 year old right-handed man with a history of hypertension, sleep apnea on CPAP, presenting for evaluation of worsening memory. He states his memory is not as good as it had been. His wife started noticing memory changes around 6 months ago, noting that he is very forgetful. He forgets where he puts things or does not remember the meal he ate prior. She feels his forgetfulness is just more than usual. He occasionally repeats himself. They both agree that he is good with managing his own medications, he rarely forgets them. 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 He denies any difficulties with driving and work. He got lost briefly a week ago in an unfamiliar road, he got "a little excited" but was able to find his way back within 5 minutes. His wife denies any other driving concerns. Wife manages finances. He is independent with dressing and bathing. His wife has noticed he is a little more stressed out and "gets really tight" when he cannot find something. No paranoia or hallucinations. Sleep is good with CPAP, no wandering behavior.  He has frontal headaches when using a machine with smoke coming out. He has occasional dizziness when standing. He has occasional back pain from a pinched nerve.  He has had a decreased sense of smell for some time. No diplopia, dysarthria/dysphagia, neck pain, focal numbness/tingling/weakness, bowel/bladder dysfunction, tremors. His father had memory issues. He occasionally drinks a little wine. No  significant head injuries.   PAST MEDICAL HISTORY: Past Medical History:  Diagnosis Date  . Allergy   . Anal fissure 20 years ago  . COVID-19 01/2019  . Diabetes mellitus without complication ([place]) 0000000  . ED (erectile dysfunction)   . Essential hypertension 10/22/2006   Qualifier: Diagnosis of  By: Sarajane Jews MD, Ishmael Holter   . GERD (gastroesophageal reflux disease)   . Hard of hearing   . 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 OSA (obstructive sleep apnea)    per Dr. Gwenette Greet , cpap -2.0  . Osteoarthritis    Right knee and right thumb; s/p total knee replacement  . Pneumonia 2015    MEDICATIONS: Current Outpatient Medications on File Prior to Visit  Medication Sig Dispense Refill  . amLODipine (NORVASC) 5 MG tablet Take 1 tablet (5 mg total) by mouth daily. 90 tablet 2  . ascorbic acid (VITAMIN C) 500 MG tablet Take 1 tablet (500 mg total) by mouth daily. 6 tablet 0  . aspirin EC 81 MG tablet Take 81 mg by mouth daily.    . fluticasone (FLONASE) 50 MCG/ACT nasal spray Place 2 sprays into both nostrils daily. 48 g 3  . losartan-hydrochlorothiazide (HYZAAR) 100-25 MG tablet TAKE 1 TABLET BY MOUTH ONCE DAILY IN THE MORNING 30 tablet 2  . meclizine (ANTIVERT) 25 MG tablet Take 1 tablet (25 mg total) by mouth every 4 (four) hours as needed for dizziness. 60 tablet 3  . memantine (NAMENDA) 10 MG tablet Take 1 tablet by mouth twice daily 60 tablet 0  . Multiple Vitamin (MULTIVITAMIN WITH MINERALS) TABS tablet Take 1 tablet by mouth daily.    . pantoprazole (PROTONIX) 40 MG tablet Take 1 tablet (40 mg total) by mouth daily. 30 tablet 5  . albuterol (VENTOLIN HFA) 108 (90 Base) MCG/ACT inhaler Inhale 2 puffs into the lungs every 6 (six) hours as needed for wheezing or shortness of breath. (Patient not taking: Reported on 07/09/2019) 1 Inhaler 11  . [DISCONTINUED] atenolol (TENORMIN) 50 MG tablet TAKE 1 TABLET EVERY DAY (NEED MD APPOINTMENT FOR REFILLS) (Patient taking differently: Take 50 mg  by mouth daily. ) 90 tablet 3   No current facility-administered medications on file prior to visit.     ALLERGIES: Allergies  Allergen Reactions  . Oxycodone     Hallucinations   . Tramadol     Hallucinations     FAMILY HISTORY: Family History  Problem Relation Age of Onset  . Breast cancer Sister   . Diabetes Sister   . Hypertension Sister   . Thyroid cancer Sister   . Diabetes Mother   . Hypertension Mother   . Diabetes Father   . Hypertension Father   . Diabetes Sister   . Hypertension Other   . Diabetes Brother   . Hypertension Brother   . Heart failure Brother   . Heart failure Brother   . Diabetes Sister   . Colon cancer Neg Hx   . Esophageal cancer Neg Hx   . Rectal cancer Neg Hx   . Stomach cancer Neg Hx     SOCIAL HISTORY: Social History   Socioeconomic History  . Marital status: Married    Spouse name: [name]  . Number of children: 2  . Years of education: 45  . Highest  education level: High school graduate  Occupational History  . Occupation: retired  Tobacco Use  . Smoking status: Former Smoker    Packs/day: 0.30    Years: 20.00    Pack years: 6.00    Types: Cigarettes    Quit date: 02/26/1977    Years since quitting: 42.3  . Smokeless tobacco: Former Systems developer    Types: Chew  Substance and Sexual Activity  . Alcohol use: No    Alcohol/week: 0.0 standard drinks  . Drug use: No  . Sexual activity: Not Currently    Partners: Female  Other Topics Concern  . Not on file  Social History Narrative   Epworth Sleepiness Scale = 5 (12/14/2014)      Right handed      Graduated HS      Lives with wife   Social Determinants of Health   Financial Resource Strain: Low Risk   . Difficulty of Paying Living Expenses: Not very hard  Food Insecurity: No Food Insecurity  . Worried About Charity fundraiser in the Last Year: Never true  . Ran Out of Food in the Last Year: Never true  Transportation Needs: No Transportation Needs  . Lack of  Transportation (Medical): No  . Lack of Transportation (Non-Medical): No  Physical Activity:   . Days of Exercise per Week:   . Minutes of Exercise per Session:   Stress:   . Feeling of Stress :   Social Connections:   . Frequency of Communication with Friends and Family:   . Frequency of Social Gatherings with Friends and Family:   . Attends Religious Services:   . Active Member of Clubs or Organizations:   . Attends Archivist Meetings:   Marland Kitchen Marital Status:   Intimate Partner Violence:   . Fear of Current or Ex-Partner:   . Emotionally Abused:   Marland Kitchen Physically Abused:   . Sexually Abused:     REVIEW OF SYSTEMS: Constitutional: No fevers, chills, or sweats, no generalized fatigue, change in appetite Eyes: No visual changes, double vision, eye pain Ear, nose and throat: No hearing loss, ear pain, nasal congestion, sore throat Cardiovascular: No chest pain, palpitations Respiratory:  No shortness of breath at rest or with exertion, wheezes GastrointestinaI: No nausea, vomiting, diarrhea, abdominal pain, fecal incontinence Genitourinary:  No dysuria, urinary retention or frequency Musculoskeletal:  No neck pain, back pain Integumentary: No rash, pruritus, skin lesions Neurological: as above Psychiatric: No depression, insomnia, anxiety Endocrine: No palpitations, fatigue, diaphoresis, mood swings, change in appetite, change in weight, increased thirst Hematologic/Lymphatic:  No anemia, purpura, petechiae. Allergic/Immunologic: no itchy/runny eyes, nasal congestion, recent allergic reactions, rashes  PHYSICAL EXAM: Vitals:   07/09/19 1307  BP: 131/82  Pulse: 65  SpO2: 98%   General: No acute distress Head:  Normocephalic/atraumatic Skin/Extremities: No rash, no edema Neurological Exam: alert and oriented to person, place, and time. No aphasia or dysarthria. Fund of knowledge is appropriate.  Recent and remote memory impaired.  Attention and concentration are normal.   Cranial nerves: Pupils equal, round, reactive to light.  Extraocular movements intact with no nystagmus. No facial asymmetry.  Motor: Bulk and tone normal, muscle strength 5/5 throughout with no pronator drift.  Finger to nose testing intact.  Gait narrow-based and steady, no ataxia.   IMPRESSION: This is a pleasant 79 yo RH man with a history of hypertension, sleep apnea on CPAP, with memory loss. 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 Findings discussed with patient and wife. Continue Memantine 10mg  BID. I discussed driving, he could not complete [place], had some diminished processing speed and other executive functioning concerns on Neuropsych testing, that a driving evaluation would be the best way to assess safety behind the wheel or let his wife take over if possible. He is understandably resistant to this. We also discussed mood changes that are common with his diagnosis, he is agreeable to starting Lexapro 10mg  daily, side effects discussed.   He has had dizziness that appears positional and will be referred for Vestibular therapy. He had an MRI brain without contrast last March 2021 for dizziness, no acute intracranial abnormalities seen. There was note of expansile appearance of the right sphenoid wing, indeterminate etiology, differentials include Page's disease or intraosseous meningioma. Contrast-enhanced MRI brain was recommended and will be ordered today. Follow-up in 6 months, they know to call for any changes.   Thank you for allowing me to participate in his care.  Please do not hesitate to call for any questions or concerns.   Ellouise Newer, M.D.   CC: [name]

## 2019-07-14 ENCOUNTER — Other Ambulatory Visit: Payer: Self-pay | Admitting: *Deleted

## 2019-07-14 NOTE — Patient Outreach (Addendum)
[facility]) Care Management  07/14/2019  Eric Richmond 24-Jul-1940 [id number]  Unsuccessful outreach attempt made to patient's wife Eric Richmond.  RN Health Coach left HIPAA compliant voicemail message along with her contact information.  Plan: RN Health Coach will call patient within the month of June  [place], [place] [phone] [name]

## 2019-07-27 DIAGNOSIS — Z886 Allergy status to analgesic agent status: Secondary | ICD-10-CM | POA: Diagnosis not present

## 2019-07-27 DIAGNOSIS — I10 Essential (primary) hypertension: Secondary | ICD-10-CM | POA: Diagnosis not present

## 2019-07-27 DIAGNOSIS — R55 Syncope and collapse: Secondary | ICD-10-CM | POA: Diagnosis not present

## 2019-07-27 DIAGNOSIS — R001 Bradycardia, unspecified: Secondary | ICD-10-CM | POA: Diagnosis not present

## 2019-07-27 DIAGNOSIS — R42 Dizziness and giddiness: Secondary | ICD-10-CM | POA: Diagnosis not present

## 2019-07-27 DIAGNOSIS — R0789 Other chest pain: Secondary | ICD-10-CM | POA: Diagnosis not present

## 2019-07-28 ENCOUNTER — Other Ambulatory Visit: Payer: Self-pay | Admitting: *Deleted

## 2019-07-28 ENCOUNTER — Telehealth: Payer: Self-pay | Admitting: *Deleted

## 2019-07-28 NOTE — Telephone Encounter (Signed)
Wife called triage. Wife reports she is calling in regards to her husband. His blood pressure was high and was advised to make an appointment to follow up with PCP. They are concerned about his heart rate being low under 50.

## 2019-07-28 NOTE — Patient Outreach (Signed)
[facility]) Care Management  07/28/2019  Eric Richmond 09-12-1940 [id number]  Outreach attempt to patient. No answer and unable to leave voicemail message due to patient's voice mailbox is full.  Plan: RN Health Coach will call patient within the month of July and patient agrees to future outreach calls.   [name] RN, BSN [place] [phone] [email or web address]

## 2019-07-29 ENCOUNTER — Telehealth: Payer: Self-pay | Admitting: Neurology

## 2019-07-29 ENCOUNTER — Telehealth: Payer: Self-pay | Admitting: Family Medicine

## 2019-07-29 NOTE — Telephone Encounter (Signed)
He should make an appt so I can evaluate him

## 2019-07-29 NOTE — Progress Notes (Signed)
  Chronic Care Management   Note  07/29/2019 Name: Eric Richmond MRN: [id number] DOB: October 16, 1940  Eric Richmond is a 79 y.o. year old male who is a primary care patient of [name], MD. I reached out to Verne Carrow by phone today in response to a referral sent by [name]'s PCP, [name], MD.   Eric Richmond was given information about Chronic Care Management services today including:  1. CCM service includes personalized support from designated clinical staff supervised by his physician, including individualized plan of care and coordination with other care providers 2. 24/7 contact phone numbers for assistance for urgent and routine care needs. 3. Service will only be billed when office clinical staff spend 20 minutes or more in a month to coordinate care. 4. Only one practitioner may furnish and bill the service in a calendar month. 5. The patient may stop CCM services at any time (effective at the end of the month) by phone call to the office staff 6. Patient agreed to services and verbal consent obtained.  Spoke with patient wife Eric Richmond to setup appt. This note is not being shared with the patient for the following reason: To respect privacy (The patient or proxy has requested that the information not be shared).  Follow up plan:  [place]

## 2019-07-29 NOTE — Telephone Encounter (Signed)
Received report from driving evaluation done 07/22/2019:  Overall driving performance: Good  At this time, Eric Richmond remains capable of safely operating a motor vehicle.

## 2019-07-29 NOTE — Telephone Encounter (Signed)
ATC, unable to leave a voice mail.  

## 2019-07-31 NOTE — Telephone Encounter (Signed)
ATC, unable to leave a voice mail.  

## 2019-08-01 ENCOUNTER — Other Ambulatory Visit: Payer: Self-pay | Admitting: Family Medicine

## 2019-08-03 NOTE — Telephone Encounter (Signed)
Unable to reach the patient. Message will be closed.  

## 2019-08-04 DIAGNOSIS — R221 Localized swelling, mass and lump, neck: Secondary | ICD-10-CM | POA: Diagnosis not present

## 2019-08-04 DIAGNOSIS — R220 Localized swelling, mass and lump, head: Secondary | ICD-10-CM | POA: Diagnosis not present

## 2019-08-11 ENCOUNTER — Other Ambulatory Visit: Payer: Self-pay | Admitting: *Deleted

## 2019-08-11 NOTE — Patient Outreach (Signed)
[facility] Care Management  [place]  08/11/2019   SIDDHANTH DENK [birth year] [id number]  Subjective: Patient's wife called nurse today to inquire about which dentist would be in network. Successful telephone call to patient's wife due to patient has dementia. HIPAA identifiers obtained. Nurse provided  Ms. Mcquigg with a [facility] phone number to inquire about a medical alert system and dental coverage. She explained that patient's B/P elevated to 170/102 when they were on vacation. This alarmed Ms. Springer so she took the patient to the ED in the [place] area. Nurse reviewed the know before you go resource sheet. Otherwise, Ms. Swab states that the patient is doing pretty well. He continues to be dizzy and has been to both PCP and neurologist to follow-up. The Neurologist has referred the patient to PT for vestibular therapy. Per wife patient's B/P usually runs in the systolic 741'O and the diastolic 87'O. She is watching how much salt he eats and explains that the patient does walk around the neighborhood routinely and continues to mow the yard. Ms. Wamser voiced no other concerns at this time.   Encounter Medications:  Outpatient Encounter Medications as of 08/11/2019  Medication Sig  . albuterol (VENTOLIN HFA) 108 (90 Base) MCG/ACT inhaler Inhale 2 puffs into the lungs every 6 (six) hours as needed for wheezing or shortness of breath. (Patient not taking: Reported on 07/09/2019)  . amLODipine (NORVASC) 5 MG tablet Take 1 tablet (5 mg total) by mouth daily.  Marland Kitchen ascorbic acid (VITAMIN C) 500 MG tablet Take 1 tablet (500 mg total) by mouth daily.  Marland Kitchen aspirin EC 81 MG tablet Take 81 mg by mouth daily.  Marland Kitchen escitalopram (LEXAPRO) 10 MG tablet Take 1 tablet (10 mg total) by mouth at bedtime.  . fluticasone (FLONASE) 50 MCG/ACT nasal spray Place 2 sprays into both nostrils daily.  Marland Kitchen losartan-hydrochlorothiazide (HYZAAR) 100-25 MG tablet TAKE 1 TABLET BY MOUTH ONCE DAILY IN THE  MORNING  . meclizine (ANTIVERT) 25 MG tablet Take 1 tablet (25 mg total) by mouth every 4 (four) hours as needed for dizziness.  . memantine (NAMENDA) 10 MG tablet Take 1 tablet (10 mg total) by mouth 2 (two) times daily.  . Multiple Vitamin (MULTIVITAMIN WITH MINERALS) TABS tablet Take 1 tablet by mouth daily.  . pantoprazole (PROTONIX) 40 MG tablet Take 1 tablet (40 mg total) by mouth daily.  . [DISCONTINUED] atenolol (TENORMIN) 50 MG tablet TAKE 1 TABLET EVERY DAY (NEED MD APPOINTMENT FOR REFILLS) (Patient taking differently: Take 50 mg by mouth daily. )   No facility-administered encounter medications on file as of 08/11/2019.    Functional Status:  In your present state of health, do you have any difficulty performing the following activities: 06/11/2019 02/23/2019  Hearing? Tempie Donning  Comment has hearing aids has hearing aids  Vision? N N  Difficulty concentrating or making decisions? Y N  Comment diagnosis of dementia -  Walking or climbing stairs? N N  Dressing or bathing? N N  Doing errands, shopping? Tempie Donning  Comment wife assist due to dementia wife takes  Conservation officer, nature and eating ? Y Y  Comment unable to prepare home-cooked meals, and do simple meals -  Using the Toilet? N N  In the past six months, have you accidently leaked urine? N N  Do you have problems with loss of bowel control? N N  Managing your Medications? Tempie Donning  Comment wife manages wife completes medications  Managing your Finances? Tempie Donning  Comment wife manages Wife does  Housekeeping or managing your Housekeeping? N Y  Comment patient is doing yard work wife handles  Some recent data might be hidden    Fall/Depression Screening: Fall Risk  07/09/2019 06/11/2019 06/05/2019  Falls in the past year? 0 0 0  Number falls in past yr: 0 0 -  Injury with Fall? 0 0 -  Follow up - Falls prevention discussed;Education provided;Falls evaluation completed -   PHQ 2/9 Scores 07/09/2019 06/11/2019 02/23/2019 08/13/2017 08/09/2016  11/18/2014  PHQ - 2 Score 0 0 0 0 1 0   [facility] CM Care Plan Problem One     Most Recent Value  Care Plan Problem One Knowledge deficient related to hypertension condition, treatment plan, and lifestyle changes.  Role Documenting the Problem One [place] for Problem One Active  [facility] Long Term Goal  Patient will maintain B/P within normal range of systolic < 397 within the next 90 days  [facility] Long Term Goal Start Date 06/11/19  Interventions for Problem One Long Term Goal RN discussed with wife the importance of taking the patient's B/P daily and recording the data, discussed eating a low sodium diet, encouraged wife to have patient to increase physical activity as tolerated, discussed starting to participate in [place] program again would be good for both the patient and Ms. Vanderberg, will send a know before you go resource sheet, nurse provided patient's wife with [facility] phone number to inquire about a medical alert and dental coverage.      Plan: Nurse will send Know Before you Go resource sheet, will send resource information about [employer] and [employer], will call patient's wife in the month of July, and patient's wife agrees to future outreach calls.   [name] RN, BSN [place] [phone] [email or web address]

## 2019-08-13 ENCOUNTER — Ambulatory Visit
Admission: RE | Admit: 2019-08-13 | Discharge: 2019-08-13 | Disposition: A | Discharge status: Home / Self Care | Payer: Medicare Other | Source: Ambulatory Visit | Attending: Neurology | Admitting: Neurology

## 2019-08-13 ENCOUNTER — Other Ambulatory Visit: Payer: Self-pay

## 2019-08-13 DIAGNOSIS — G3184 Mild cognitive impairment, so stated: Secondary | ICD-10-CM

## 2019-08-13 DIAGNOSIS — R9089 Other abnormal findings on diagnostic imaging of central nervous system: Secondary | ICD-10-CM

## 2019-08-13 DIAGNOSIS — R42 Dizziness and giddiness: Secondary | ICD-10-CM

## 2019-08-13 DIAGNOSIS — R413 Other amnesia: Secondary | ICD-10-CM | POA: Diagnosis not present

## 2019-08-13 MED ORDER — GADOBENATE DIMEGLUMINE 529 MG/ML IV SOLN
17.0000 mL | Freq: Once | INTRAVENOUS | Status: AC | PRN
  Administered 2019-08-13: 17 mL via INTRAVENOUS

## 2019-08-27 ENCOUNTER — Ambulatory Visit: Payer: Medicare Other | Attending: Neurology | Admitting: Physical Therapy

## 2019-08-27 ENCOUNTER — Other Ambulatory Visit: Payer: Self-pay

## 2019-08-27 ENCOUNTER — Encounter: Payer: Self-pay | Admitting: Physical Therapy

## 2019-08-27 VITALS — BP 154/92 | HR 59

## 2019-08-27 DIAGNOSIS — R262 Difficulty in walking, not elsewhere classified: Secondary | ICD-10-CM | POA: Diagnosis not present

## 2019-08-27 DIAGNOSIS — R42 Dizziness and giddiness: Secondary | ICD-10-CM | POA: Diagnosis not present

## 2019-08-27 DIAGNOSIS — R2681 Unsteadiness on feet: Secondary | ICD-10-CM

## 2019-08-27 NOTE — Therapy (Signed)
[facility] [address] [place], [place], [address] Phone: [phone]   Fax:  [phone]  Physical Therapy Evaluation  Patient Details  Name: Eric Richmond MRN: [id number] Date of Birth: Jan 10, 1941 Referring Provider (PT): Cameron Sprang, MD   Encounter Date: 08/27/2019   PT End of Session - 08/27/19 1257    Visit Number 1    Number of Visits 7    Date for PT Re-Evaluation 10/26/19    Authorization Type UHC Medicare; $35 copay    PT Start Time 1145    PT Stop Time 1235    PT Time Calculation (min) 50 min    Activity Tolerance Patient tolerated treatment well    Behavior During Therapy [facility] for tasks assessed/performed           Past Medical History:  Diagnosis Date  . Allergy   . Anal fissure 20 years ago  . COVID-19 01/2019  . Diabetes mellitus without complication ([place]) 2/58/5277  . ED (erectile dysfunction)   . Essential hypertension 10/22/2006   Qualifier: Diagnosis of  By: Sarajane Jews MD, Ishmael Holter   . GERD (gastroesophageal reflux disease)   . Hard of hearing   . 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 OSA (obstructive sleep apnea)    per Dr. Gwenette Greet , cpap -2.0  . Osteoarthritis    Right knee and right thumb; s/p total knee replacement  . Pneumonia 2015    Past Surgical History:  Procedure Laterality Date  . ANAL FISSURE REPAIR    . colonoscopy  08/07/2016   per Dr. Hilarie Fredrickson, adenomatous polyps removed, repeat in 3 years   . KNEE ARTHROSCOPY     left knee  . KNEE CLOSED REDUCTION Left 01/13/2014   Procedure: CLOSED MANIPULATION LEFT KNEE;  Surgeon: Gearlean Alf, MD;  Location: [facility];  Service: Orthopedics;  Laterality: Left;  . SURGERY AGE 84 OR 7 ON LEFT KNEE FOR A "GLAND BEHIND THE KNEE"    . TOTAL KNEE ARTHROPLASTY Left 10/26/2013   Procedure: LEFT TOTAL KNEE ARTHROPLASTY;  Surgeon: Gearlean Alf, MD;  Location: [facility];  Service: Orthopedics;  Laterality: Left;    Vitals:   08/27/19  1156  BP: (!) 154/92  Pulse: (!) 59      Subjective Assessment - 08/27/19 1156    Subjective Dizziness started a few months ago, still having it intermittently.  Pt feels the dizziness with lying on his R side.  Also feels it when walking around sometimes.  No falls.  Denies vomiting, headaches, changes in vision, denies sudden changes in vision.    Patient is accompained by: Family member    Pertinent History memory loss, COVID-19 with brain fog, DM, HTN, HOH, OSA, OA with L TKA, PNA    Diagnostic tests MRI - R sphenoid wing lesion; mild/mod cerebral atrophy - Mild Neurocognitive disorder (Alzheimer's disease?)    Currently in Pain? No/denies              [facility] PT Assessment - 08/27/19 1158      Assessment   Medical Diagnosis Vertigo    Referring Provider (PT) Cameron Sprang, MD    Onset Date/Surgical Date 07/09/19    Prior Therapy not for dizziness      Precautions   Precautions Other (comment)    Precaution Comments COVID-19 with brain fog, DM, HTN, HOH, OSA, OA with L TKA, PNA, Memory loss, abnormal finding on MRI      Balance Screen  Has the patient fallen in the past 6 months No      [place] residence    Living Arrangements Spouse/significant other      Prior Function   Level of Independence Independent      Observation/Other Assessments   Focus on Therapeutic Outcomes (FOTO)  Not assessed due to cognitive deficits      Sensation   Light Touch Appears Intact      ROM / Strength   AROM / PROM / Strength Strength      Strength   Overall Strength Within functional limits for tasks performed                  Vestibular Assessment - 08/27/19 1200      Symptom Behavior   Type of Dizziness  Spinning    Frequency of Dizziness intermittent    Duration of Dizziness short duration    Symptom Nature Motion provoked;Positional    Aggravating Factors Rolling to right    Relieving Factors Slow movements     Progression of Symptoms Better      Oculomotor Exam   Oculomotor Alignment Normal    Ocular ROM WFL    Spontaneous Absent    Gaze-induced  Absent    Smooth Pursuits Intact    Saccades Intact      Oculomotor Exam-Fixation Suppressed    Left Head Impulse unable to test, pt helping with head movement and guarding with quick turn    Right Head Impulse unable to test, pt helping with head movement and guarding with quick turn      Vestibulo-Ocular Reflex   VOR to Slow Head Movement Positive bilaterally    VOR Cancellation Unable to maintain gaze      Positional Testing   Dix-Hallpike Dix-Hallpike Right;Dix-Hallpike Left    Horizontal Canal Testing Horizontal Canal Right;Horizontal Canal Left      Dix-Hallpike Right   Dix-Hallpike Right Duration 0    Dix-Hallpike Right Symptoms No nystagmus      Dix-Hallpike Left   Dix-Hallpike Left Duration 30 seconds    Dix-Hallpike Left Symptoms No nystagmus      Horizontal Canal Right   Horizontal Canal Right Duration 15 seconds    Horizontal Canal Right Symptoms Normal      Horizontal Canal Left   Horizontal Canal Left Duration 0    Horizontal Canal Left Symptoms Normal      Positional Sensitivities   Sit to Supine Lightheadedness    Supine to Left Side No dizziness    Supine to Right Side Lightheadedness    Supine to Sitting Mild dizziness    Right Hallpike No dizziness    Up from Right Hallpike No dizziness    Up from Left Hallpike Mild dizziness    Nose to Right Knee No dizziness    Right Knee to Sitting No dizziness    Nose to Left Knee Lightheadedness    Left Knee to Sitting Lightheadedness    Head Turning x 5 Lightheadedness    Head Nodding x 5 Lightheadedness    Pivot Right in Standing No dizziness    Pivot Left in Standing Lightheadedness    Rolling Right Lightheadedness    Rolling Left No dizziness    Positional Sensitivities Comments more symptomatic to the L      Orthostatics   BP supine (x 5 minutes) 132/80    HR  supine (x 5 minutes) 60    BP sitting 137/81  HR sitting 62    BP standing (after 1 minute) 130/75    HR standing (after 1 minute) 66    BP standing (after 3 minutes) 138/86    HR standing (after 3 minutes) 66              Objective measurements completed on examination: See above findings.               PT Education - 08/27/19 1257    Education Details clinical findings, PT POC and goals    Person(s) Educated Patient;Spouse    Methods Explanation    Comprehension Verbalized understanding            PT Short Term Goals - 08/28/19 6010      PT SHORT TERM GOAL #1   Title Pt will participate in further assessment of falls risk during gait (DGI).    Time 3    Period Weeks    Status New    Target Date 09/18/19      PT SHORT TERM GOAL #2   Title Pt will report 1/5 dizziness with all bed mobility    Baseline 2/5    Time 3    Period Weeks    Status New    Target Date 09/18/19      PT SHORT TERM GOAL #3   Title Pt will initiate vestibular and balance HEP with wife's supervision    Time 3    Period Weeks    Status New    Target Date 09/18/19             PT Long Term Goals - 08/28/19 0835      PT LONG TERM GOAL #1   Title Pt will demonstrate ability to perform final HEP with wife's supervision    Time 6    Period Weeks    Status New    Target Date 10/12/19      PT LONG TERM GOAL #2   Title Pt will report no dizziness with bed mobility, sit <> stand, head and body turns, and reaching down to the floor    Time 6    Period Weeks    Status New    Target Date 10/12/19      PT LONG TERM GOAL #3   Title Pt will demonstrate 4 point improvement in DGI to indicate decreased falls risk    Baseline TBD    Time 6    Period Weeks    Status New    Target Date 10/12/19      PT LONG TERM GOAL #4   Title Pt will demonstrate negative positional testing    Time 6    Period Weeks    Status New    Target Date 10/12/19                  Plan  - 08/27/19 1259    Clinical Impression Statement Patient is a 79 year old male referred to outpatient neurorehabilitation for evaluation of vertigo.  Pt's PMH is significant for the following: COVID-19 with brain fog, DM, HTN, HOH, OSA, OA with L TKA, PNA, memory loss and abnormal MRI with R sphenoid wing lesion, cerebral atrophy and mild neurocognitive disorder.  PT assessment was significant for the following: impaired oculomotor exam, subjective dizziness/vertigo but no nystagmus seen with roll test to L side and during hallpike-dix to R and L side, motion sensitivity with movements to the L > movements to the R, impaired balance and  difficulty walking due to dizziness.  Orthostatic vitals assessed with no drop in BP noted.  Differential diagnosis includes spontaneous resolution of L BPPV with ongoing motion sensitivity vs. L vestibular hypofunction vs. central vertigo.  Pt will benefit from skilled PT services to address these impairments to maximize functional mobility independence and decrease falls risk.    Personal Factors and Comorbidities Comorbidity 3+    Comorbidities COVID-19 with brain fog, DM, HTN, HOH, OSA, OA with L TKA, PNA, memory loss, mild neurocognitive disorder    Examination-Activity Limitations Bed Mobility;Bend;Locomotion Level;Stand    Stability/Clinical Decision Making Stable/Uncomplicated    Clinical Decision Making Low    Rehab Potential Good    PT Frequency 1x / week    PT Duration 8 weeks    PT Treatment/Interventions ADLs/Self Care Home Management;Canalith Repostioning;Gait training;Functional mobility training;Therapeutic activities;Therapeutic exercise;Balance training;Neuromuscular re-education;Patient/family education;Vestibular    PT Next Visit Plan Assess DGI and reset LTG.  Has some cognitive impairments.  Didn't see any nystagmus on eval but if he is still reporting dizziness with bed mobility - recheck for L BPPV and treat if indicated.  Initiate HEP focusing on  gaze adaptation, habituation to rolling, supine <> sit, reaching down to the floor, head/body turns, balance training    Consulted and Agree with Plan of Care Patient;Family member/caregiver    Family Member Consulted wife           Patient will benefit from skilled therapeutic intervention in order to improve the following deficits and impairments:  Decreased balance, Difficulty walking, Dizziness  Visit Diagnosis: Dizziness and giddiness  Unsteadiness on feet  Difficulty in walking, not elsewhere classified     Problem List Patient Active Problem List   Diagnosis Date Noted  . Vertigo 06/08/2019  . Respiratory tract infection due to COVID-19 virus 02/13/2019  . Acute confusion 02/13/2019  . 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 Environmental and seasonal allergies 05/20/2018  . Postoperative stiffness of total knee replacement ([place]) 01/12/2014  . OA (osteoarthritis) of knee 10/26/2013  . Diabetes mellitus without complication ([place]) 32/91/9166  . OSA (obstructive sleep apnea) 07/21/2010  . Essential hypertension 10/22/2006  . GERD (gastroesophageal reflux disease) 10/22/2006    Rico Junker, PT, DPT 08/28/19    8:41 AM    [facility] [address] [place], [place], [address] Phone: [phone]   Fax:  [phone]  Name: FUAD FORGET MRN: [id number] Date of Birth: 09-12-1940

## 2019-09-03 ENCOUNTER — Other Ambulatory Visit: Payer: Self-pay | Admitting: Family Medicine

## 2019-09-03 NOTE — Telephone Encounter (Signed)
Pt is out of town and forgot two of his medications but they will be back Saturday. His spouse is wondering if PCP can call in amlodipine and memantine to a pharmacy near them just enough till they get home Saturday.   Pharmacy:  [facility], [place] Phone:  [phone]  Fax:  [phone]

## 2019-09-03 NOTE — Telephone Encounter (Signed)
Please advise 

## 2019-09-03 NOTE — Telephone Encounter (Signed)
Pt wife call and stated she call this morning  And ask if dr.Fry would sent  blood pressure  Pill just enough until Saturday.

## 2019-09-04 ENCOUNTER — Other Ambulatory Visit: Payer: Self-pay

## 2019-09-04 MED ORDER — AMLODIPINE BESYLATE 5 MG PO TABS: 5.0000 mg | ORAL_TABLET | Freq: Every day | ORAL | 0 refills | Status: DC

## 2019-09-04 NOTE — Telephone Encounter (Signed)
Unable to fill the memantine not prescribed by dr.Fry.

## 2019-09-04 NOTE — Telephone Encounter (Signed)
Looks like pt is due for his supply so 90 day sent to [facility]. Called pt spouse 3x no answer.

## 2019-09-07 NOTE — Telephone Encounter (Signed)
Noted  

## 2019-09-09 ENCOUNTER — Other Ambulatory Visit: Payer: Self-pay | Admitting: *Deleted

## 2019-09-09 NOTE — Patient Outreach (Signed)
[facility]) Care Management  [place]  09/09/2019   Eric Richmond 1940/05/17 [id number]  Subjective: Successful telephone outreach call to patient. HIPAA identifiers obtained. Per patient's wife, patient is doing well. He had a spike in his B/P 154/100 due to  missing his medication for a couple of days while on vacation. However, patient is back to baseline with B/P being in the 409'B systolic to 35'H diastolic range. Patient continue to have a good appetite and is adhering to a low sodium diet. He continues to be active by walking and doing yard work. Patient also began PT for his ongoing vertigo as previously ordered by Dr. Delice Lesch. Per wife he did well with his first session and will soon go back for his second PT session. Ms. Hoban states that despite ongoing vertigo and dizziness patient has not had any falls. Ms. Broady did not have any other questions or concerns at this time and did confirm that she has nurse's contact number if needed.   Encounter Medications:  Outpatient Encounter Medications as of 09/09/2019  Medication Sig  . albuterol (VENTOLIN HFA) 108 (90 Base) MCG/ACT inhaler Inhale 2 puffs into the lungs every 6 (six) hours as needed for wheezing or shortness of breath.  Marland Kitchen amLODipine (NORVASC) 5 MG tablet Take 1 tablet (5 mg total) by mouth daily.  Marland Kitchen ascorbic acid (VITAMIN C) 500 MG tablet Take 1 tablet (500 mg total) by mouth daily.  Marland Kitchen aspirin EC 81 MG tablet Take 81 mg by mouth daily.  Marland Kitchen escitalopram (LEXAPRO) 10 MG tablet Take 1 tablet (10 mg total) by mouth at bedtime.  . fluticasone (FLONASE) 50 MCG/ACT nasal spray Place 2 sprays into both nostrils daily.  Marland Kitchen losartan-hydrochlorothiazide (HYZAAR) 100-25 MG tablet TAKE 1 TABLET BY MOUTH ONCE DAILY IN THE MORNING  . meclizine (ANTIVERT) 25 MG tablet Take 1 tablet (25 mg total) by mouth every 4 (four) hours as needed for dizziness.  . memantine (NAMENDA) 10 MG tablet Take 1 tablet (10 mg total) by  mouth 2 (two) times daily.  . Multiple Vitamin (MULTIVITAMIN WITH MINERALS) TABS tablet Take 1 tablet by mouth daily.  . pantoprazole (PROTONIX) 40 MG tablet Take 1 tablet (40 mg total) by mouth daily.  . [DISCONTINUED] atenolol (TENORMIN) 50 MG tablet TAKE 1 TABLET EVERY DAY (NEED MD APPOINTMENT FOR REFILLS) (Patient taking differently: Take 50 mg by mouth daily. )   No facility-administered encounter medications on file as of 09/09/2019.   Functional Status:  In your present state of health, do you have any difficulty performing the following activities: 06/11/2019 02/23/2019  Hearing? Tempie Donning  Comment has hearing aids has hearing aids  Vision? N N  Difficulty concentrating or making decisions? Y N  Comment diagnosis of dementia -  Walking or climbing stairs? N N  Dressing or bathing? N N  Doing errands, shopping? Tempie Donning  Comment wife assist due to dementia wife takes  Conservation officer, nature and eating ? Y Y  Comment unable to prepare home-cooked meals, and do simple meals -  Using the Toilet? N N  In the past six months, have you accidently leaked urine? N N  Do you have problems with loss of bowel control? N N  Managing your Medications? Tempie Donning  Comment wife manages wife completes medications  Managing your Finances? Tempie Donning  Comment wife manages Wife does  Housekeeping or managing your Housekeeping? N Y  Comment patient is doing yard work wife handles  Some  recent data might be hidden    Fall/Depression Screening: Fall Risk  09/09/2019 09/09/2019 07/09/2019  Falls in the past year? 0 0 0  Number falls in past yr: 0 0 0  Injury with Fall? 0 0 0  Risk for fall due to : Other (Comment) - -  Risk for fall due to: Comment persistant dizziness due to vertigo - -  Follow up Falls prevention discussed;Education provided;Falls evaluation completed Falls prevention discussed;Education provided;Falls evaluation completed -   PHQ 2/9 Scores 07/09/2019 06/11/2019 02/23/2019 08/13/2017 08/09/2016 11/18/2014  PHQ -  2 Score 0 0 0 0 1 0   Goals Addressed            This Visit's Progress   . Patient's B/P will be maintained below 150/90 per wife's verbalization within the next 90 days       [place] (see longtitudinal plan of care for additional care plan information)  Objective:  . Last practice recorded BP readings:  BP Readings from Last 3 Encounters:  08/27/19 (!) 154/92  07/09/19 131/82  07/08/19 128/66 .   Marland Kitchen Most recent eGFR/CrCl: No results found for: EGFR  No components found for: CRCL  Current Barriers:  Marland Kitchen Knowledge deficit related to self care management of hypertension  Case Manager Clinical Goal(s):  Marland Kitchen Over the next 90 days, patient will verbalize understanding of plan for hypertension management . Over the next 90 days, patient will demonstrate improved adherence to prescribed treatment plan for hypertension as evidenced by taking all medications as prescribed, monitoring and recording blood pressure as directed, adhering to low sodium/DASH diet  Interventions:  . Evaluation of current treatment plan related to hypertension self management and patient's adherence to plan as established by provider. . Reviewed medications with patient and discussed importance of compliance . Discussed plans with patient for ongoing care management follow up and provided patient with direct contact information for care management team  Patient Self Care Activities:  . Self administers medications as prescribed . Attends all scheduled provider appointments . Calls provider office for new concerns, questions, or BP outside discussed parameters . Monitors BP and records as discussed . Adheres to a low sodium diet/DASH diet . Increase physical activity as tolerated . Patient's wife assists and manages patient's hypertension due to patient having dementia.         Plan: RN Health Coach will call patient within the month of September and patient's wife agrees to future outreach calls.    [name] RN, BSN [place] [phone] [email or web address]

## 2019-09-15 ENCOUNTER — Other Ambulatory Visit: Payer: Self-pay

## 2019-09-15 ENCOUNTER — Encounter: Payer: Self-pay | Admitting: Physical Therapy

## 2019-09-15 ENCOUNTER — Ambulatory Visit: Payer: Medicare Other | Admitting: Physical Therapy

## 2019-09-15 DIAGNOSIS — R2681 Unsteadiness on feet: Secondary | ICD-10-CM | POA: Diagnosis not present

## 2019-09-15 DIAGNOSIS — R42 Dizziness and giddiness: Secondary | ICD-10-CM

## 2019-09-15 DIAGNOSIS — R262 Difficulty in walking, not elsewhere classified: Secondary | ICD-10-CM | POA: Diagnosis not present

## 2019-09-15 NOTE — Patient Instructions (Signed)
Sit to Side-Lying    Sit on edge of bed.  1. Turn head 45 to right.  2. Maintain head position and lie down slowly on left side. Hold until symptoms subside.  3. Sit up slowly. Hold until symptoms subside.  4. Turn head 45 to left.  5. Maintain head position and lie down slowly on right side. Hold until symptoms subside.  6. Sit up slowly. Repeat sequence __3-4 each times each way per session. Do __2__ sessions per day.  Copyright  VHI. All rights reserved.  Gaze Stabilization: Sitting    Keeping eyes on target on wall 10N feet away, tilt head down 15-30 and move head side to side for __30__ seconds. Rest until symptoms go away. Then repeat while moving head up and down for __30__ seconds. Do each head movement 2 times.  Do __2__ sessions per day.   Copyright  VHI. All rights reserved.  Gaze Stabilization: Tip Card  1.Target must remain in focus, not blurry, and appear stationary while head is in motion. 2.Perform exercises with small head movements (45 to either side of midline). 3.Increase speed of head motion so long as target is in focus. 4.If you wear eyeglasses, be sure you can see target through lens (therapist will give specific instructions for bifocal / progressive lenses). 5.These exercises may provoke dizziness or nausea. Work through these symptoms. If too dizzy, slow head movement slightly. Rest between each exercise. 6.Exercises demand concentration; avoid distractions. 7.For safety, perform standing exercises close to a counter, wall, corner, or next to someone.  Copyright  VHI. All rights reserved.

## 2019-09-16 NOTE — Therapy (Signed)
[facility] [address] [place], [place], [address] Phone: [phone]   Fax:  [phone]  Physical Therapy Treatment  Patient Details  Name: Eric Richmond MRN: [id number] Date of Birth: 09-Jun-1940 Referring Provider (PT): Cameron Sprang, MD   Encounter Date: 09/15/2019   PT End of Session - 09/15/19 0720    Visit Number 2    Number of Visits 7    Date for PT Re-Evaluation 10/26/19    Authorization Type [employer] Medicare; $35 copay    PT Start Time 0717    PT Stop Time 0800    PT Time Calculation (min) 43 min    Activity Tolerance Patient tolerated treatment well    Behavior During Therapy [facility] for tasks assessed/performed           Past Medical History:  Diagnosis Date  . Allergy   . Anal fissure 20 years ago  . COVID-19 01/2019  . Diabetes mellitus without complication ([place]) 6/73/4193  . ED (erectile dysfunction)   . Essential hypertension 10/22/2006   Qualifier: Diagnosis of  By: Sarajane Jews MD, Ishmael Holter   . GERD (gastroesophageal reflux disease)   . Hard of hearing   . 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 OSA (obstructive sleep apnea)    per Dr. Gwenette Greet , cpap -2.0  . Osteoarthritis    Right knee and right thumb; s/p total knee replacement  . Pneumonia 2015    Past Surgical History:  Procedure Laterality Date  . ANAL FISSURE REPAIR    . colonoscopy  08/07/2016   per Dr. Hilarie Fredrickson, adenomatous polyps removed, repeat in 3 years   . KNEE ARTHROSCOPY     left knee  . KNEE CLOSED REDUCTION Left 01/13/2014   Procedure: CLOSED MANIPULATION LEFT KNEE;  Surgeon: Gearlean Alf, MD;  Location: [facility];  Service: Orthopedics;  Laterality: Left;  . SURGERY AGE 60 OR 7 ON LEFT KNEE FOR A "GLAND BEHIND THE KNEE"    . TOTAL KNEE ARTHROPLASTY Left 10/26/2013   Procedure: LEFT TOTAL KNEE ARTHROPLASTY;  Surgeon: Gearlean Alf, MD;  Location: [facility];  Service: Orthopedics;  Laterality: Left;    There were no vitals  filed for this visit.   Subjective Assessment - 09/15/19 0720    Subjective No new omplaints. Reports the dizziness comes and goes. Mostly in the morning and gets better as the day progressess. No falls or pain to report.    Patient is accompained by: Family member    Pertinent History memory loss, COVID-19 with brain fog, DM, HTN, HOH, OSA, OA with L TKA, PNA    Diagnostic tests MRI - R sphenoid wing lesion; mild/mod cerebral atrophy - Mild Neurocognitive disorder (Alzheimer's disease?)    Currently in Pain? No/denies              [facility] PT Assessment - 09/15/19 0721      Standardized Balance Assessment   Standardized Balance Assessment Dynamic Gait Index      Dynamic Gait Index   Level Surface Normal    Change in Gait Speed Normal    Gait with Horizontal Head Turns Mild Impairment    Gait with Vertical Head Turns Mild Impairment    Gait and Pivot Turn Mild Impairment    Step Over Obstacle Mild Impairment    Step Around Obstacles Normal    Steps Moderate Impairment   no rail, reciprocal ascending, step to descending   Total Score 18  Treatment continued: Issued to HEP today with performance in session.  Minor increase in symptoms with Nestor Lewandowsky ex's each way. Performed each way 3 times. With 1st reps symptoms resolved within 30-40 sec's. With 2cd reps symptoms resolved within 20-30 sec's. With final 3rd reps symptoms resolved within 20-30 sec's. Pt did report decreased intensity of symptoms as reps progressed.    Sit to Side-Lying    Sit on edge of bed.  1. Turn head 45 to right.  2. Maintain head position and lie down slowly on left side. Hold until symptoms subside.  3. Sit up slowly. Hold until symptoms subside.  4. Turn head 45 to left.  5. Maintain head position and lie down slowly on right side. Hold until symptoms subside.  6. Sit up slowly. Repeat sequence __3-4 each times each way per session. Do __2__ sessions per day.  Copyright  VHI. All  rights reserved.  Performed these is session for 30 sec's x 2 reps each. Rest between sets to allow for symptoms to resolve.  Cues for smaller head movements needed.  Gaze Stabilization: Sitting    Keeping eyes on target on wall 10 feet away, tilt head down 15-30 and move head side to side for __30__ seconds. Rest until symptoms go away. Then repeat while moving head up and down for __30__ seconds. Do each head movement 2 times.  Do __2__ sessions per day.   Copyright  VHI. All rights reserved.  Gaze Stabilization: Tip Card  1.Target must remain in focus, not blurry, and appear stationary while head is in motion. 2.Perform exercises with small head movements (45 to either side of midline). 3.Increase speed of head motion so long as target is in focus. 4.If you wear eyeglasses, be sure you can see target through lens (therapist will give specific instructions for bifocal / progressive lenses). 5.These exercises may provoke dizziness or nausea. Work through these symptoms. If too dizzy, slow head movement slightly. Rest between each exercise. 6.Exercises demand concentration; avoid distractions. 7.For safety, perform standing exercises close to a counter, wall, corner, or next to someone.  Copyright  VHI. All rights reserved.    Treatment continued: Also had pt work on bending over to retrieve 3 cones from floor, then place them back down. No dizziness or other symptoms reported with this task after performing 2 reps, therefore did not issue to HEP.         PT Education - 09/15/19 1630    Education Details issued HEP for habituation and gaze stabilization    Person(s) Educated Patient;Spouse    Methods Explanation;Demonstration;Verbal cues;Handout    Comprehension Verbalized understanding;Returned demonstration;Verbal cues required;Need further instruction            PT Short Term Goals - 08/28/19 2703      PT SHORT TERM GOAL #1   Title Pt will participate in further  assessment of falls risk during gait (DGI).    Time 3    Period Weeks    Status New    Target Date 09/18/19      PT SHORT TERM GOAL #2   Title Pt will report 1/5 dizziness with all bed mobility    Baseline 2/5    Time 3    Period Weeks    Status New    Target Date 09/18/19      PT SHORT TERM GOAL #3   Title Pt will initiate vestibular and balance HEP with wife's supervision    Time 3  Period Weeks    Status New    Target Date 09/18/19             PT Long Term Goals - 08/28/19 0835      PT LONG TERM GOAL #1   Title Pt will demonstrate ability to perform final HEP with wife's supervision    Time 6    Period Weeks    Status New    Target Date 10/12/19      PT LONG TERM GOAL #2   Title Pt will report no dizziness with bed mobility, sit <> stand, head and body turns, and reaching down to the floor    Time 6    Period Weeks    Status New    Target Date 10/12/19      PT LONG TERM GOAL #3   Title Pt will demonstrate 4 point improvement in DGI to indicate decreased falls risk    Baseline TBD    Time 6    Period Weeks    Status New    Target Date 10/12/19      PT LONG TERM GOAL #4   Title Pt will demonstrate negative positional testing    Time 6    Period Weeks    Status New    Target Date 10/12/19                 Plan - 09/15/19 0721    Clinical Impression Statement Today's skilled session initially focused on performance of Dynamic Gait Index for baseline score of 19/24. Primary PT to set goals as indicated. Remainder of session focused on establishing an HEP for habituation with mild increase in dizziness/sypmtoms reported that resolved in less than a minute each time. The pt is progressing toward goals and should benefit from continued PT to progress toward unmet goals.    Personal Factors and Comorbidities Comorbidity 3+    Comorbidities COVID-19 with brain fog, DM, HTN, HOH, OSA, OA with L TKA, PNA, memory loss, mild neurocognitive disorder     Examination-Activity Limitations Bed Mobility;Bend;Locomotion Level;Stand    Stability/Clinical Decision Making Stable/Uncomplicated    Rehab Potential Good    PT Frequency 1x / week    PT Duration 8 weeks    PT Treatment/Interventions ADLs/Self Care Home Management;Canalith Repostioning;Gait training;Functional mobility training;Therapeutic activities;Therapeutic exercise;Balance training;Neuromuscular re-education;Patient/family education;Vestibular    PT Next Visit Plan Has some cognitive impairments.  Didn't see any nystagmus on eval but if he is still reporting dizziness with bed mobility - recheck for L BPPV and treat if indicated.  Add to HEP as able to incorporate head/body turns and balance training    Consulted and Agree with Plan of Care Patient;Family member/caregiver    Family Member Consulted wife           Patient will benefit from skilled therapeutic intervention in order to improve the following deficits and impairments:  Decreased balance, Difficulty walking, Dizziness  Visit Diagnosis: Dizziness and giddiness  Unsteadiness on feet  Difficulty in walking, not elsewhere classified     Problem List Patient Active Problem List   Diagnosis Date Noted  . Vertigo 06/08/2019  . Respiratory tract infection due to COVID-19 virus 02/13/2019  . Acute confusion 02/13/2019  . 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 Environmental and seasonal allergies 05/20/2018  . Postoperative stiffness of total knee replacement ([place]) 01/12/2014  . OA (osteoarthritis) of knee 10/26/2013  . Diabetes mellitus without complication ([place]) 40/97/3532  . OSA (obstructive sleep apnea) 07/21/2010  .  Essential hypertension 10/22/2006  . GERD (gastroesophageal reflux disease) 10/22/2006    Willow Ora, PTA, [facility] [address] [place], [place] [address] [phone] 09/16/19, 10:59 AM   Name: Eric Richmond MRN: [id number] Date of Birth:  06/25/1940

## 2019-09-17 ENCOUNTER — Ambulatory Visit: Payer: Medicare Other

## 2019-09-23 ENCOUNTER — Other Ambulatory Visit: Payer: Self-pay | Admitting: Family Medicine

## 2019-09-23 ENCOUNTER — Other Ambulatory Visit: Payer: Self-pay

## 2019-09-23 ENCOUNTER — Ambulatory Visit: Payer: Medicare Other | Admitting: Physical Therapy

## 2019-09-23 ENCOUNTER — Encounter: Payer: Self-pay | Admitting: Physical Therapy

## 2019-09-23 DIAGNOSIS — R2681 Unsteadiness on feet: Secondary | ICD-10-CM | POA: Diagnosis not present

## 2019-09-23 DIAGNOSIS — R42 Dizziness and giddiness: Secondary | ICD-10-CM | POA: Diagnosis not present

## 2019-09-23 DIAGNOSIS — R262 Difficulty in walking, not elsewhere classified: Secondary | ICD-10-CM

## 2019-09-23 NOTE — Patient Instructions (Addendum)
Sit to Side-Lying    Sit on edge of bed.  1. Turn head 45 to right.  2. Maintain head position and lie down slowly on left side. Hold until symptoms subside.  3. Sit up slowly. Hold until symptoms subside.  4. Turn head 45 to left.  5. Maintain head position and lie down slowly on right side. Hold until symptoms subside.  6. Sit up slowly. Repeat sequence __3 times each way per session. Do __2__ sessions per day.  Copyright  VHI. All rights reserved.  Gaze Stabilization: Sitting    Keeping eyes on target on wall 3 feet away, tilt head down 15-30 and move head side to side for __30__ seconds. Rest until symptoms go away. Then repeat while moving head up and down for __30__ seconds. Do each head movement 2 times.  Do __2__ sessions per day.     Side to Side Head Motion    Walking on solid surface, one hand on the wall, turn head and eyes to left for __5-6__ steps. Then, turn head and eyes to the right side for _5-6__ steps. Repeat sequence __4__ times per session. Do __2__ sessions per day.

## 2019-09-23 NOTE — Therapy (Signed)
[facility] [address] [place], [place], [address] Phone: [phone]   Fax:  [phone]  Physical Therapy Treatment  Patient Details  Name: Eric Richmond MRN: [id number] Date of Birth: 29-Aug-1940 Referring Provider (PT): Cameron Sprang, MD   Encounter Date: 09/23/2019   PT End of Session - 09/23/19 0924    Visit Number 3    Number of Visits 7    Date for PT Re-Evaluation 10/26/19    Authorization Type [employer] Medicare; $35 copay    PT Start Time 0722    PT Stop Time 0808    PT Time Calculation (min) 46 min    Activity Tolerance Patient tolerated treatment well    Behavior During Therapy [facility] for tasks assessed/performed           Past Medical History:  Diagnosis Date  . Allergy   . Anal fissure 20 years ago  . COVID-19 01/2019  . Diabetes mellitus without complication ([place]) 2/45/8099  . ED (erectile dysfunction)   . Essential hypertension 10/22/2006   Qualifier: Diagnosis of  By: Sarajane Jews MD, Ishmael Holter   . GERD (gastroesophageal reflux disease)   . Hard of hearing   . 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 OSA (obstructive sleep apnea)    per Dr. Gwenette Greet , cpap -2.0  . Osteoarthritis    Right knee and right thumb; s/p total knee replacement  . Pneumonia 2015    Past Surgical History:  Procedure Laterality Date  . ANAL FISSURE REPAIR    . colonoscopy  08/07/2016   per Dr. Hilarie Fredrickson, adenomatous polyps removed, repeat in 3 years   . KNEE ARTHROSCOPY     left knee  . KNEE CLOSED REDUCTION Left 01/13/2014   Procedure: CLOSED MANIPULATION LEFT KNEE;  Surgeon: Gearlean Alf, MD;  Location: [facility];  Service: Orthopedics;  Laterality: Left;  . SURGERY AGE 44 OR 7 ON LEFT KNEE FOR A "GLAND BEHIND THE KNEE"    . TOTAL KNEE ARTHROPLASTY Left 10/26/2013   Procedure: LEFT TOTAL KNEE ARTHROPLASTY;  Surgeon: Gearlean Alf, MD;  Location: [facility];  Service: Orthopedics;  Laterality: Left;    There were no vitals  filed for this visit.   Subjective Assessment - 09/23/19 0726    Subjective Dizziness is better but still there.  Hasn't done the exercises as much since last session.  Is planning on going back to the [facility] to do some walking.  No other issues to report.    Patient is accompained by: Family member    Pertinent History memory loss, COVID-19 with brain fog, DM, HTN, HOH, OSA, OA with L TKA, PNA    Diagnostic tests MRI - R sphenoid wing lesion; mild/mod cerebral atrophy - Mild Neurocognitive disorder (Alzheimer's disease?)    Currently in Pain? No/denies                             [facility] Adult PT Treatment/Exercise - 09/23/19 0920      Therapeutic Activites    Therapeutic Activities Other Therapeutic Activities    Other Therapeutic Activities Pt asking if he can drive; asked pt what MD has recommended.  Pt reports he had a $250 driving test and was found to be safe.  Wife concerned about dizziness when driving and pt's use of Meclizine that makes him drowsy.  Reviewed safe usage of medication as prescribed by MD (as needed), side effects and attempting to  wean down off of the medicine as he continues to work on exercises to address dizziness.  Discussed that medication and dizziness when turning his head can affect his driving and make it more dangerous.  Discussed trying supervised driving around the neighborhood first to see if he has any symptoms or unsafe driving behavior.  Will continue to discuss at next session.           Vestibular Treatment/Exercise - 09/23/19 1638      Vestibular Treatment/Exercise   Vestibular Treatment Provided Habituation;Gaze    Habituation Exercises Nestor Lewandowsky    Gaze Exercises X1 Viewing Horizontal;X1 Viewing Vertical      Nestor Lewandowsky   Number of Reps  3    Symptom Description  Symptom intensity and duration decreased with each repetition      X1 Viewing Horizontal   Foot Position seated    Comments attempted 30 seconds; pt  required significant verbal, visual and tactile cues for pure head rotation.  Pt attempted to compensate with extension and lateral flexion.  Mild dizziness      X1 Viewing Vertical   Foot Position seated    Reps 2    Comments 30 seconds.  Mild dizziness              Balance Exercises - 09/23/19 0920      Balance Exercises: Standing   Gait with Head Turns Forward;4 reps;Upper extremity support   one head turn to L and one to R            PT Education - 09/23/19 0923    Education Details see TA; reviewed HEP again    Person(s) Educated Patient;Spouse    Methods Explanation;Demonstration;Handout    Comprehension Verbalized understanding;Returned demonstration;Need further instruction          Sit to Side-Lying    Sit on edge of bed.  1. Turn head 45 to right.  2. Maintain head position and lie down slowly on left side. Hold until symptoms subside.  3. Sit up slowly. Hold until symptoms subside.  4. Turn head 45 to left.  5. Maintain head position and lie down slowly on right side. Hold until symptoms subside.  6. Sit up slowly. Repeat sequence __3 times each way per session. Do __2__ sessions per day.  Copyright  VHI. All rights reserved.  Gaze Stabilization: Sitting    Keeping eyes on target on wall 3 feet away, tilt head down 15-30 and move head side to side for __30__ seconds. Rest until symptoms go away. Then repeat while moving head up and down for __30__ seconds. Do each head movement 2 times.  Do __2__ sessions per day.     Side to Side Head Motion    Walking on solid surface, one hand on the wall, turn head and eyes to left for __5-6__ steps. Then, turn head and eyes to the right side for _5-6__ steps. Repeat sequence __4__ times per session. Do __2__ sessions per day.     PT Short Term Goals - 08/28/19 4665      PT SHORT TERM GOAL #1   Title Pt will participate in further assessment of falls risk during gait (DGI).    Time 3    Period  Weeks    Status New    Target Date 09/18/19      PT SHORT TERM GOAL #2   Title Pt will report 1/5 dizziness with all bed mobility    Baseline 2/5  Time 3    Period Weeks    Status New    Target Date 09/18/19      PT SHORT TERM GOAL #3   Title Pt will initiate vestibular and balance HEP with wife's supervision    Time 3    Period Weeks    Status New    Target Date 09/18/19             PT Long Term Goals - 08/28/19 0835      PT LONG TERM GOAL #1   Title Pt will demonstrate ability to perform final HEP with wife's supervision    Time 6    Period Weeks    Status New    Target Date 10/12/19      PT LONG TERM GOAL #2   Title Pt will report no dizziness with bed mobility, sit <> stand, head and body turns, and reaching down to the floor    Time 6    Period Weeks    Status New    Target Date 10/12/19      PT LONG TERM GOAL #3   Title Pt will demonstrate 4 point improvement in DGI to indicate decreased falls risk    Baseline TBD    Time 6    Period Weeks    Status New    Target Date 10/12/19      PT LONG TERM GOAL #4   Title Pt will demonstrate negative positional testing    Time 6    Period Weeks    Status New    Target Date 10/12/19                 Plan - 09/23/19 7062    Clinical Impression Statement Pt has not been performing exercises regularly and has continued to rely on Meclizine when he feels any indication of dizziness - this may be affecting results of testing in therapy and re-assessment may be indicated as pt weans off of medication.  Reviewed HEP and added walking with head turns and reinforced importance of practicing vestibular exercises at home for maximum benefit.  Will continue to reinforce and progress towards LTG.    Personal Factors and Comorbidities Comorbidity 3+    Comorbidities COVID-19 with brain fog, DM, HTN, HOH, OSA, OA with L TKA, PNA, memory loss, mild neurocognitive disorder    Examination-Activity Limitations Bed  Mobility;Bend;Locomotion Level;Stand    Stability/Clinical Decision Making Stable/Uncomplicated    Rehab Potential Good    PT Frequency 1x / week    PT Duration 8 weeks    PT Treatment/Interventions ADLs/Self Care Home Management;Canalith Repostioning;Gait training;Functional mobility training;Therapeutic activities;Therapeutic exercise;Balance training;Neuromuscular re-education;Patient/family education;Vestibular    PT Next Visit Plan Has he been performing exercises and weaning off medication?  Any significant increase in dizziness?  continue to progress x1 viewing and walking with head turns; add head nods; add corner balance    Consulted and Agree with Plan of Care Patient;Family member/caregiver    Family Member Consulted wife           Patient will benefit from skilled therapeutic intervention in order to improve the following deficits and impairments:  Decreased balance, Difficulty walking, Dizziness  Visit Diagnosis: Dizziness and giddiness  Unsteadiness on feet  Difficulty in walking, not elsewhere classified     Problem List Patient Active Problem List   Diagnosis Date Noted  . Vertigo 06/08/2019  . Respiratory tract infection due to COVID-19 virus 02/13/2019  . Acute confusion 02/13/2019  .  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 Environmental and seasonal allergies 05/20/2018  . Postoperative stiffness of total knee replacement ([place]) 01/12/2014  . OA (osteoarthritis) of knee 10/26/2013  . Diabetes mellitus without complication ([place]) 86/16/8372  . OSA (obstructive sleep apnea) 07/21/2010  . Essential hypertension 10/22/2006  . GERD (gastroesophageal reflux disease) 10/22/2006    Rico Junker, PT, DPT 09/23/19    9:27 AM    [facility] [address] [place], [place], [address] Phone: [phone]   Fax:  [phone]  Name: DEOVION BATREZ MRN: [id number] Date of Birth: 12/26/1940

## 2019-09-30 ENCOUNTER — Ambulatory Visit: Payer: Medicare Other | Attending: Neurology | Admitting: Physical Therapy

## 2019-09-30 ENCOUNTER — Other Ambulatory Visit: Payer: Self-pay

## 2019-09-30 ENCOUNTER — Encounter: Payer: Self-pay | Admitting: Physical Therapy

## 2019-09-30 DIAGNOSIS — R42 Dizziness and giddiness: Secondary | ICD-10-CM | POA: Insufficient documentation

## 2019-09-30 DIAGNOSIS — R2681 Unsteadiness on feet: Secondary | ICD-10-CM | POA: Diagnosis not present

## 2019-09-30 DIAGNOSIS — R262 Difficulty in walking, not elsewhere classified: Secondary | ICD-10-CM | POA: Diagnosis not present

## 2019-09-30 NOTE — Therapy (Signed)
[facility] [address] [place], [place], [address] Phone: [phone]   Fax:  [phone]  Physical Therapy Treatment  Patient Details  Name: Eric Richmond MRN: [id number] Date of Birth: 06-20-1940 Referring Provider (PT): Cameron Sprang, MD   Encounter Date: 09/30/2019   PT End of Session - 09/30/19 0758    Visit Number 4    Number of Visits 7    Date for PT Re-Evaluation 10/26/19    Authorization Type [employer]HC Medicare; $35 copay    PT Start Time 0717    PT Stop Time 0756    PT Time Calculation (min) 39 min    Activity Tolerance Patient tolerated treatment well    Behavior During Therapy [facility] for tasks assessed/performed           Past Medical History:  Diagnosis Date  . Allergy   . Anal fissure 20 years ago  . COVID-19 01/2019  . Diabetes mellitus without complication ([place]) 06/11/6061  . ED (erectile dysfunction)   . Essential hypertension 10/22/2006   Qualifier: Diagnosis of  By: Sarajane Jews MD, Ishmael Holter   . GERD (gastroesophageal reflux disease)   . Hard of hearing   . 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 OSA (obstructive sleep apnea)    per Dr. Gwenette Greet , cpap -2.0  . Osteoarthritis    Right knee and right thumb; s/p total knee replacement  . Pneumonia 2015    Past Surgical History:  Procedure Laterality Date  . ANAL FISSURE REPAIR    . colonoscopy  08/07/2016   per Dr. Hilarie Fredrickson, adenomatous polyps removed, repeat in 3 years   . KNEE ARTHROSCOPY     left knee  . KNEE CLOSED REDUCTION Left 01/13/2014   Procedure: CLOSED MANIPULATION LEFT KNEE;  Surgeon: Gearlean Alf, MD;  [facility];  Service: Orthopedics;  Laterality: Left;  . SURGERY AGE 64 OR 7 ON LEFT KNEE FOR A "GLAND BEHIND THE KNEE"    . TOTAL KNEE ARTHROPLASTY Left 10/26/2013   Procedure: LEFT TOTAL KNEE ARTHROPLASTY;  Surgeon: Gearlean Alf, MD;  Location: [facility];  Service: Orthopedics;  Laterality: Left;    There were no vitals  filed for this visit.   Subjective Assessment - 09/30/19 0719    Subjective Pt drove self this morning; felt a little dizziness when driving.  Has been taking less of the dizziness medicine, did not take this morning.  Exercises are going well, feels a little dizziness with walking with head turns.    Patient is accompained by: Family member    Pertinent History memory loss, COVID-19 with brain fog, DM, HTN, HOH, OSA, OA with L TKA, PNA    Diagnostic tests MRI - R sphenoid wing lesion; mild/mod cerebral atrophy - Mild Neurocognitive disorder (Alzheimer's disease?)    Currently in Pain? No/denies                              Vestibular Treatment/Exercise - 09/30/19 0720      Vestibular Treatment/Exercise   Vestibular Treatment Provided Gaze    Gaze Exercises X1 Viewing Horizontal;X1 Viewing Vertical      X1 Viewing Horizontal   Foot Position seated and then standing without and then with UE support    Comments 30 seconds with improved horizontal head movement; no symptoms.  60 seconds seated no symptoms; in standing performed x 30 seconds.  When performing without UE support pt returned  to tilting head and had increased difficulty maintaining gaze stability.  Added chair for support and pt able to perform with correct technique, mild symptoms      X1 Viewing Vertical   Foot Position seated and then standing without and then with UE support    Comments 30 seconds with improved horizontal head movement; no symptoms.  60 seconds seated no symptoms; in standing performed x 30 seconds.  When performing without UE support pt returned to tilting head and had increased difficulty maintaining gaze stability.  Added chair for support and pt able to perform with correct technique, mild symptoms              Balance Exercises - 09/30/19 0741      Balance Exercises: Standing   Gait with Head Turns Forward;Upper extremity support;4 reps   head turns L/R, up/down with UE support  on wall   Tandem Gait Forward;Upper extremity support;4 reps   solid surface and then compliant   Marching Solid surface;Upper extremity assist 1;Dynamic;Forwards;Foam/compliant surface   3 laps down and back            PT Education - 09/30/19 0758    Education Details updated HEP    Person(s) Educated Patient    Methods Explanation    Comprehension Verbalized understanding          Sit to Side-Lying    Sit on edge of bed.  1. Turn head 45 to right.  2. Maintain head position and lie down slowly on left side. Hold until symptoms subside.  3. Sit up slowly. Hold until symptoms subside.  4. Turn head 45 to left.  5. Maintain head position and lie down slowly on right side. Hold until symptoms subside.  6. Sit up slowly. Repeat sequence __3 times each way per session. Do __2__ sessions per day.    Side to Side Head Motion    Walking on solid surface, one hand on the wall, turn head and eyes to left for __5-6__ steps. Then, turn head and eyes to the right side for _5-6__ steps. Repeat sequence __4__ times per session. Do __2__ sessions per day.    Gaze Stabilization - Tip Card  1.Target must remain in focus, not blurry, and appear stationary while head is in motion. 2.Perform exercises with small head movements (45 to either side of midline). 3.Increase speed of head motion so long as target is in focus. 4.If you wear eyeglasses, be sure you can see target through lens (therapist will give specific instructions for bifocal / progressive lenses). 5.These exercises may provoke dizziness or nausea. Work through these symptoms. If too dizzy, slow head movement slightly. Rest between each exercise. 6.Exercises demand concentration; avoid distractions. 7.For safety, perform standing exercises close to a counter, wall, corner, or next to someone.  Copyright  VHI. All rights reserved.   Gaze Stabilization - Standing Feet Apart   Stand with a chair in front of you to  put your hands on for support.  Feet shoulder wide, keeping eyes on target on wall 3 feet away, tilt head down slightly and move head side to side for 30 seconds. Repeat while moving head up and down for 30 seconds. Do 2-3 sessions per day.     Up / Down Head Motion    Walking in your hallway with hand on the wall for support, move head and eyes toward ceiling for __2-3__ steps. Then, move head and eyes toward floor for _2-3__ steps.  Repeat __2__  laps per session.    PT Short Term Goals - 09/30/19 0758      PT SHORT TERM GOAL #1   Title Pt will participate in further assessment of falls risk during gait (DGI).    Baseline 18/24    Time 3    Period Weeks    Status Achieved    Target Date 09/18/19      PT SHORT TERM GOAL #2   Title Pt will report 1/5 dizziness with all bed mobility    Baseline 2/5    Time 3    Period Weeks    Status On-going    Target Date 09/18/19      PT SHORT TERM GOAL #3   Title Pt will initiate vestibular and balance HEP with wife's supervision    Time 3    Period Weeks    Status Achieved    Target Date 09/18/19             PT Long Term Goals - 08/28/19 0835      PT LONG TERM GOAL #1   Title Pt will demonstrate ability to perform final HEP with wife's supervision    Time 6    Period Weeks    Status New    Target Date 10/12/19      PT LONG TERM GOAL #2   Title Pt will report no dizziness with bed mobility, sit <> stand, head and body turns, and reaching down to the floor    Time 6    Period Weeks    Status New    Target Date 10/12/19      PT LONG TERM GOAL #3   Title Pt will demonstrate 4 point improvement in DGI to indicate decreased falls risk    Baseline TBD    Time 6    Period Weeks    Status New    Target Date 10/12/19      PT LONG TERM GOAL #4   Title Pt will demonstrate negative positional testing    Time 6    Period Weeks    Status New    Target Date 10/12/19                 Plan - 09/30/19 1056     Clinical Impression Statement Pt reports performing HEP at home and pt demonstrates improvement with VOR x1 viewing; able to progress to standing but requires UE support for stability.  Progressed gait with head turns to include vertical head movements and added to HEP.  Continued dynamic balance on solid and compliant surface focusing on SLS and narrow BOS.  Pt tolerated well.  Will continue to progress towards LTG.    Personal Factors and Comorbidities Comorbidity 3+    Comorbidities COVID-19 with brain fog, DM, HTN, HOH, OSA, OA with L TKA, PNA, memory loss, mild neurocognitive disorder    Examination-Activity Limitations Bed Mobility;Bend;Locomotion Level;Stand    Stability/Clinical Decision Making Stable/Uncomplicated    Rehab Potential Good    PT Frequency 1x / week    PT Duration 8 weeks    PT Treatment/Interventions ADLs/Self Care Home Management;Canalith Repostioning;Gait training;Functional mobility training;Therapeutic activities;Therapeutic exercise;Balance training;Neuromuscular re-education;Patient/family education;Vestibular    PT Next Visit Plan progress x1 viewing in standing without UE support, stepping over obstacles, walking with head turns without UE support, add corner balance    Consulted and Agree with Plan of Care Patient;Family member/caregiver    Family Member Consulted wife  Patient will benefit from skilled therapeutic intervention in order to improve the following deficits and impairments:  Decreased balance, Difficulty walking, Dizziness  Visit Diagnosis: Dizziness and giddiness  Unsteadiness on feet  Difficulty in walking, not elsewhere classified     Problem List Patient Active Problem List   Diagnosis Date Noted  . Vertigo 06/08/2019  . Respiratory tract infection due to COVID-19 virus 02/13/2019  . Acute confusion 02/13/2019  . 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 Environmental and seasonal allergies 05/20/2018  . Postoperative  stiffness of total knee replacement ([place]) 01/12/2014  . OA (osteoarthritis) of knee 10/26/2013  . Diabetes mellitus without complication ([place]) 44/69/5072  . OSA (obstructive sleep apnea) 07/21/2010  . Essential hypertension 10/22/2006  . GERD (gastroesophageal reflux disease) 10/22/2006    Eric Richmond, PT, DPT 09/30/19    11:01 AM    [facility] [address] [place], [place], [address] Phone: [phone]   Fax:  [phone]  Name: Eric Richmond MRN: [id number] Date of Birth: 1940-10-12

## 2019-09-30 NOTE — Patient Instructions (Addendum)
Sit to Side-Lying    Sit on edge of bed.  1. Turn head 45 to right.  2. Maintain head position and lie down slowly on left side. Hold until symptoms subside.  3. Sit up slowly. Hold until symptoms subside.  4. Turn head 45 to left.  5. Maintain head position and lie down slowly on right side. Hold until symptoms subside.  6. Sit up slowly. Repeat sequence __3 times each way per session. Do __2__ sessions per day.    Side to Side Head Motion    Walking on solid surface, one hand on the wall, turn head and eyes to left for __5-6__ steps. Then, turn head and eyes to the right side for _5-6__ steps. Repeat sequence __4__ times per session. Do __2__ sessions per day.    Gaze Stabilization - Tip Card  1.Target must remain in focus, not blurry, and appear stationary while head is in motion. 2.Perform exercises with small head movements (45 to either side of midline). 3.Increase speed of head motion so long as target is in focus. 4.If you wear eyeglasses, be sure you can see target through lens (therapist will give specific instructions for bifocal / progressive lenses). 5.These exercises may provoke dizziness or nausea. Work through these symptoms. If too dizzy, slow head movement slightly. Rest between each exercise. 6.Exercises demand concentration; avoid distractions. 7.For safety, perform standing exercises close to a counter, wall, corner, or next to someone.  Copyright  VHI. All rights reserved.   Gaze Stabilization - Standing Feet Apart   Stand with a chair in front of you to put your hands on for support.  Feet shoulder wide, keeping eyes on target on wall 3 feet away, tilt head down slightly and move head side to side for 30 seconds. Repeat while moving head up and down for 30 seconds. Do 2-3 sessions per day.     Up / Down Head Motion    Walking in your hallway with hand on the wall for support, move head and eyes toward ceiling for __2-3__ steps. Then, move  head and eyes toward floor for _2-3__ steps.  Repeat __2__ laps per session.

## 2019-10-07 ENCOUNTER — Other Ambulatory Visit: Payer: Self-pay

## 2019-10-07 ENCOUNTER — Ambulatory Visit: Payer: Medicare Other | Admitting: Physical Therapy

## 2019-10-07 ENCOUNTER — Encounter: Payer: Self-pay | Admitting: Physical Therapy

## 2019-10-07 DIAGNOSIS — R262 Difficulty in walking, not elsewhere classified: Secondary | ICD-10-CM

## 2019-10-07 DIAGNOSIS — R42 Dizziness and giddiness: Secondary | ICD-10-CM | POA: Diagnosis not present

## 2019-10-07 DIAGNOSIS — R2681 Unsteadiness on feet: Secondary | ICD-10-CM | POA: Diagnosis not present

## 2019-10-07 NOTE — Therapy (Signed)
[facility] [address] [place], [place], [address] Phone: [phone]   Fax:  [phone]  Physical Therapy Treatment  Patient Details  Name: Eric Richmond MRN: [id number] Date of Birth: 03/19/1940 Referring Provider (PT): Cameron Sprang, MD   Encounter Date: 10/07/2019   PT End of Session - 10/07/19 1107    Visit Number 5    Number of Visits 7    Date for PT Re-Evaluation 10/26/19    Authorization Type UHC Medicare; $35 copay    PT Start Time 0720    PT Stop Time 0805    PT Time Calculation (min) 45 min    Activity Tolerance Patient tolerated treatment well    Behavior During Therapy [facility] for tasks assessed/performed           Past Medical History:  Diagnosis Date  . Allergy   . Anal fissure 20 years ago  . COVID-19 01/2019  . Diabetes mellitus without complication ([place]) 08/19/7626  . ED (erectile dysfunction)   . Essential hypertension 10/22/2006   Qualifier: Diagnosis of  By: Sarajane Jews MD, Ishmael Holter   . GERD (gastroesophageal reflux disease)   . Hard of hearing   . 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 OSA (obstructive sleep apnea)    per Dr. Gwenette Greet , cpap -2.0  . Osteoarthritis    Right knee and right thumb; s/p total knee replacement  . Pneumonia 2015    Past Surgical History:  Procedure Laterality Date  . ANAL FISSURE REPAIR    . colonoscopy  08/07/2016   per Dr. Hilarie Fredrickson, adenomatous polyps removed, repeat in 3 years   . KNEE ARTHROSCOPY     left knee  . KNEE CLOSED REDUCTION Left 01/13/2014   Procedure: CLOSED MANIPULATION LEFT KNEE;  Surgeon: Gearlean Alf, MD;  Location: [facility];  Service: Orthopedics;  Laterality: Left;  . SURGERY AGE 79 OR 7 ON LEFT KNEE FOR A "GLAND BEHIND THE KNEE"    . TOTAL KNEE ARTHROPLASTY Left 10/26/2013   Procedure: LEFT TOTAL KNEE ARTHROPLASTY;  Surgeon: Gearlean Alf, MD;  Location: [facility];  Service: Orthopedics;  Laterality: Left;    There were no vitals  filed for this visit.   Subjective Assessment - 10/07/19 0721    Subjective [name] with patient today.  Pt still experiencing intermittent episodes of dizziness throughout the day; spontaneous - not able to determine what brings on the dizziness and what makes it better. Reports he is doing vestibular exercises and is going to the [employer] to do some walking and leg exercises.    Patient is accompained by: Family member    Pertinent History memory loss, COVID-19 with brain fog, DM, HTN, HOH, OSA, OA with L TKA, PNA    Diagnostic tests MRI - R sphenoid wing lesion; mild/mod cerebral atrophy - Mild Neurocognitive disorder (Alzheimer's disease?)    Currently in Pain? No/denies                   Vestibular Assessment - 10/07/19 0747      Positional Testing   Sidelying Test Sidelying Left      Dix-Hallpike Right   Dix-Hallpike Right Duration 30 seconds    Dix-Hallpike Right Symptoms Other (comment)   R rotary     Dix-Hallpike Left   Dix-Hallpike Left Duration >60 seconds    Dix-Hallpike Left Symptoms Downbeat Nystagmus      Sidelying Left   Sidelying Left Duration performing Brandt-Daroff pt demonstrated downbeating  nystagmus                     Vestibular Treatment/Exercise - 10/07/19 0724      Vestibular Treatment/Exercise   Vestibular Treatment Provided Gaze;Canalith Repositioning    Canalith Repositioning Comment    Gaze Exercises X1 Viewing Horizontal;X1 Viewing Vertical;Comment      OTHER   Comment Deep head hang due to downbeat nystagmus to see if pt responds and improves for differential diagnosis between central vs. peripheral vertigo      X1 Viewing Horizontal   Foot Position standing feet apart, no UE support    Reps 2    Comments 30 seconds > 60 seconds mild symptoms with tactile cues required to perform pure rotation with lateral flexion      X1 Viewing Vertical   Foot Position standing feet apart, no UE support    Reps 2    Comments 30 > 60  seconds mild dizziness              Balance Exercises - 10/07/19 0734      Balance Exercises: Standing   Gait with Head Turns Forward;Retro;Upper extremity support;Other reps (comment)   10 reps, head up/down, L/R and mixed            PT Education - 10/07/19 1105    Education Details reviewed and updated HEP again with wife observing; attempted to simplify HEP to allow pt to remember and wife to assist.  Nystagmus observed today during Brandt-Daroff and positional testing; explained to pt how dizziness could be central vs. positional and purpose of maneuver.    Person(s) Educated Patient;Spouse    Methods Explanation;Demonstration;Handout    Comprehension Verbalized understanding;Returned demonstration            PT Short Term Goals - 09/30/19 0758      PT SHORT TERM GOAL #1   Title Pt will participate in further assessment of falls risk during gait (DGI).    Baseline 18/24    Time 3    Period Weeks    Status Achieved    Target Date 09/18/19      PT SHORT TERM GOAL #2   Title Pt will report 1/5 dizziness with all bed mobility    Baseline 2/5    Time 3    Period Weeks    Status On-going    Target Date 09/18/19      PT SHORT TERM GOAL #3   Title Pt will initiate vestibular and balance HEP with wife's supervision    Time 3    Period Weeks    Status Achieved    Target Date 09/18/19             PT Long Term Goals - 08/28/19 0835      PT LONG TERM GOAL #1   Title Pt will demonstrate ability to perform final HEP with wife's supervision    Time 6    Period Weeks    Status New    Target Date 10/12/19      PT LONG TERM GOAL #2   Title Pt will report no dizziness with bed mobility, sit <> stand, head and body turns, and reaching down to the floor    Time 6    Period Weeks    Status New    Target Date 10/12/19      PT LONG TERM GOAL #3   Title Pt will demonstrate 4 point improvement in DGI to indicate decreased falls  risk    Baseline TBD    Time 6     Period Weeks    Status New    Target Date 10/12/19      PT LONG TERM GOAL #4   Title Pt will demonstrate negative positional testing    Time 6    Period Weeks    Status New    Target Date 10/12/19                 Plan - 10/07/19 1107    Clinical Impression Statement Pt not experiencing a change in symptoms at home; unable to assess if pt is truly performing HEP at home due to memory deficits.  Pt has been weaning down off of Meclizine and today when performing Brandt-Daroff pt experienced dizziness and nystagmus >60 seconds.  Observed downbeating nystagmus; performed deep head hang to determine if vertigo is primarily positional in nature vs. central.  Will continue to assess and progress.    Personal Factors and Comorbidities Comorbidity 3+    Comorbidities COVID-19 with brain fog, DM, HTN, HOH, OSA, OA with L TKA, PNA, memory loss, mild neurocognitive disorder    Examination-Activity Limitations Bed Mobility;Bend;Locomotion Level;Stand    Stability/Clinical Decision Making Stable/Uncomplicated    Rehab Potential Good    PT Frequency 1x / week    PT Duration 8 weeks    PT Treatment/Interventions ADLs/Self Care Home Management;Canalith Repostioning;Gait training;Functional mobility training;Therapeutic activities;Therapeutic exercise;Balance training;Neuromuscular re-education;Patient/family education;Vestibular    PT Next Visit Plan Recheck dix-hallpike - still having downbeating? re treat with deep head hang and if nothing changes, likely central - continue with habituation.  progress x1 viewing in standing without UE support, stepping over obstacles, walking with head turns without UE support, add corner balance    Consulted and Agree with Plan of Care Patient;Family member/caregiver    Family Member Consulted wife           Patient will benefit from skilled therapeutic intervention in order to improve the following deficits and impairments:  Decreased balance, Difficulty  walking, Dizziness  Visit Diagnosis: Dizziness and giddiness  Unsteadiness on feet  Difficulty in walking, not elsewhere classified     Problem List Patient Active Problem List   Diagnosis Date Noted  . Vertigo 06/08/2019  . Respiratory tract infection due to COVID-19 virus 02/13/2019  . Acute confusion 02/13/2019  . 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 Environmental and seasonal allergies 05/20/2018  . Postoperative stiffness of total knee replacement ([place]) 01/12/2014  . OA (osteoarthritis) of knee 10/26/2013  . Diabetes mellitus without complication ([place]) 08/26/1599  . OSA (obstructive sleep apnea) 07/21/2010  . Essential hypertension 10/22/2006  . GERD (gastroesophageal reflux disease) 10/22/2006   Rico Junker, PT, DPT 10/07/19    11:12 AM    [facility] [address] [place], [place], [address] Phone: [phone]   Fax:  [phone]  Name: DEZ STAUFFER MRN: [id number] Date of Birth: [birth year]

## 2019-10-07 NOTE — Patient Instructions (Addendum)
Walking Side to Side Head Motion    Walking on solid surface, one hand on the wall, turn head and eyes to left for __5-6__ steps. Then, turn head and eyes to the right side for _5-6__ steps. Walk backwards repeating head turn to the left and then head turn to the right.  Repeat sequence __4__ times per session. Do __2__ sessions per day.   Up / Down Head Motion    Walking in your hallway with hand on the wall for support, move head and eyes toward ceiling for __5-6__ steps. Then, move head and eyes toward floor for _5-6__ steps.  Walk backwards repeating head up and then head down.   Repeat __4__ laps per session.    Gaze Stabilization - Tip Card  1.Target must remain in focus, not blurry, and appear stationary while head is in motion. 2.Perform exercises with small head movements (45 to either side of midline). 3.Increase speed of head motion so long as target is in focus. 4.If you wear eyeglasses, be sure you can see target through lens (therapist will give specific instructions for bifocal / progressive lenses). 5.These exercises may provoke dizziness or nausea. Work through these symptoms. If too dizzy, slow head movement slightly. Rest between each exercise. 6.Exercises demand concentration; avoid distractions. 7.For safety, perform standing exercises close to a counter, wall, corner, or next to someone.  Copyright  VHI. All rights reserved.   Gaze Stabilization - Standing Feet Apart   Feet shoulder width apart, keeping eyes on target on wall 3 feet away, tilt head down slightly and move head side to side for 60 seconds. Repeat while moving head up and down for 60 seconds.  Do 2-3 sessions per day.

## 2019-10-14 ENCOUNTER — Encounter: Payer: Self-pay | Admitting: Physical Therapy

## 2019-10-14 ENCOUNTER — Ambulatory Visit: Payer: Medicare Other | Admitting: Physical Therapy

## 2019-10-14 ENCOUNTER — Other Ambulatory Visit: Payer: Self-pay

## 2019-10-14 ENCOUNTER — Other Ambulatory Visit: Payer: Self-pay | Admitting: Physician Assistant

## 2019-10-14 DIAGNOSIS — R42 Dizziness and giddiness: Secondary | ICD-10-CM | POA: Diagnosis not present

## 2019-10-14 DIAGNOSIS — R262 Difficulty in walking, not elsewhere classified: Secondary | ICD-10-CM | POA: Diagnosis not present

## 2019-10-14 DIAGNOSIS — R2681 Unsteadiness on feet: Secondary | ICD-10-CM | POA: Diagnosis not present

## 2019-10-14 NOTE — Therapy (Signed)
[facility] [address] [place], [place], [address] Phone: [phone]   Fax:  [phone]  Physical Therapy Treatment  Patient Details  Name: Eric Richmond MRN: [id number] Date of Birth: December 03, 1940 Referring Provider (PT): Cameron Sprang, MD   Encounter Date: 10/14/2019   PT End of Session - 10/14/19 0806    Visit Number 6    Number of Visits 7    Date for PT Re-Evaluation 10/26/19    Authorization Type UHC Medicare; $35 copay    PT Start Time 0718    PT Stop Time 0800    PT Time Calculation (min) 42 min    Activity Tolerance Patient tolerated treatment well    Behavior During Therapy [facility] for tasks assessed/performed           Past Medical History:  Diagnosis Date  . Allergy   . Anal fissure 20 years ago  . COVID-19 01/2019  . Diabetes mellitus without complication ([place]) 8/67/6195  . ED (erectile dysfunction)   . Essential hypertension 10/22/2006   Qualifier: Diagnosis of  By: Sarajane Jews MD, Ishmael Holter   . GERD (gastroesophageal reflux disease)   . Hard of hearing   . 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 OSA (obstructive sleep apnea)    per Dr. Gwenette Greet , cpap -2.0  . Osteoarthritis    Right knee and right thumb; s/p total knee replacement  . Pneumonia 2015    Past Surgical History:  Procedure Laterality Date  . ANAL FISSURE REPAIR    . colonoscopy  08/07/2016   per Dr. Hilarie Fredrickson, adenomatous polyps removed, repeat in 3 years   . KNEE ARTHROSCOPY     left knee  . KNEE CLOSED REDUCTION Left 01/13/2014   Procedure: CLOSED MANIPULATION LEFT KNEE;  Surgeon: Gearlean Alf, MD;  Location: [facility];  Service: Orthopedics;  Laterality: Left;  . SURGERY AGE 88 OR 7 ON LEFT KNEE FOR A "GLAND BEHIND THE KNEE"    . TOTAL KNEE ARTHROPLASTY Left 10/26/2013   Procedure: LEFT TOTAL KNEE ARTHROPLASTY;  Surgeon: Gearlean Alf, MD;  Location: [facility];  Service: Orthopedics;  Laterality: Left;    There were no vitals  filed for this visit.   Subjective Assessment - 10/14/19 0720    Subjective Pt drove today.  Reports slight improvement in dizziness.  Reports he is performing the exercises.  Still going to the [facility] and walking.    Patient is accompained by: Family member    Pertinent History memory loss, COVID-19 with brain fog, DM, HTN, HOH, OSA, OA with L TKA, PNA    Diagnostic tests MRI - R sphenoid wing lesion; mild/mod cerebral atrophy - Mild Neurocognitive disorder (Alzheimer's disease?)    Currently in Pain? No/denies              [facility] PT Assessment - 10/14/19 0739      Dynamic Gait Index   Level Surface Normal    Change in Gait Speed Normal   dizziness with fast walking   Gait with Horizontal Head Turns Mild Impairment   LOB with L head turn   Gait with Vertical Head Turns Normal    Gait and Pivot Turn Normal    Step Over Obstacle Normal    Step Around Obstacles Normal    Steps Normal   dizziness when descending   Total Score 23    DGI comment: 23/24  Vestibular Assessment - 10/14/19 0722      Positional Testing   Dix-Hallpike Dix-Hallpike Left;Dix-Hallpike Right      Dix-Hallpike Left   Dix-Hallpike Left Duration <30 seconds    Dix-Hallpike Left Symptoms Downbeat Nystagmus;Other (comment)                     Vestibular Treatment/Exercise - 10/14/19 0728      Vestibular Treatment/Exercise   Vestibular Treatment Provided Canalith Repositioning;Gaze    Canalith Repositioning Comment    Gaze Exercises X1 Viewing Horizontal;X1 Viewing Vertical;Comment      OTHER   Comment Nystagmus decreased duration today; performed deep head hang x 2 - second assessment nystagmus decreased to <10 seconds and decreased intensity.  Third assessment pt continued to demonstrate downbeat nystagmus but did not report any vertigo      X1 Viewing Horizontal   Foot Position standing feet apart and then feet together    Reps 2    Comments 60 seconds, mild symptoms        X1 Viewing Vertical   Foot Position standing feet apart and then feet together    Reps 2    Comments 60 seconds, mild symptoms                 PT Education - 10/14/19 0757    Education Details will add 4 more visits to continue to address dizziness with head turns, supine <> sit and looking down, progressed x 1 viewing to feet together    Person(s) Educated Patient    Methods Explanation    Comprehension Verbalized understanding            PT Short Term Goals - 09/30/19 0758      PT SHORT TERM GOAL #1   Title Pt will participate in further assessment of falls risk during gait (DGI).    Baseline 18/24    Time 3    Period Weeks    Status Achieved    Target Date 09/18/19      PT SHORT TERM GOAL #2   Title Pt will report 1/5 dizziness with all bed mobility    Baseline 2/5    Time 3    Period Weeks    Status On-going    Target Date 09/18/19      PT SHORT TERM GOAL #3   Title Pt will initiate vestibular and balance HEP with wife's supervision    Time 3    Period Weeks    Status Achieved    Target Date 09/18/19             PT Long Term Goals - 10/14/19 0810      PT LONG TERM GOAL #1   Title Pt will demonstrate ability to perform final HEP with wife's supervision    Time 6    Period Weeks    Status On-going      PT LONG TERM GOAL #2   Title Pt will report no dizziness with bed mobility, sit <> stand, head and body turns, and reaching down to the floor    Time 6    Period Weeks    Status On-going      PT LONG TERM GOAL #3   Title Pt will demonstrate 4 point improvement in DGI to indicate decreased falls risk    Baseline 18 > 23/24    Time 6    Period Weeks    Status Achieved      PT LONG TERM  GOAL #4   Title Pt will demonstrate negative positional testing    Time 6    Period Weeks    Status On-going                 Plan - 10/14/19 0807    Clinical Impression Statement Pt is reporting decreased dizziness overall; continued to present  with decreased intensity and duration of dizziness during deep head hang but continued to demonstrate mild downbeating nystagmus.  Treated x 3 with deep head hang and with pt reporting no vertigo by third repetition.  Performed re-assessment of balance with DGI with pt demonstrating decreased falls risk but ongoing dizziness with head turns, looking down and with fast walking.  Will continue to address and progress towards LTG.    Personal Factors and Comorbidities Comorbidity 3+    Comorbidities COVID-19 with brain fog, DM, HTN, HOH, OSA, OA with L TKA, PNA, memory loss, mild neurocognitive disorder    Examination-Activity Limitations Bed Mobility;Bend;Locomotion Level;Stand    Stability/Clinical Decision Making Stable/Uncomplicated    Rehab Potential Good    PT Frequency 1x / week    PT Duration 8 weeks    PT Treatment/Interventions ADLs/Self Care Home Management;Canalith Repostioning;Gait training;Functional mobility training;Therapeutic activities;Therapeutic exercise;Balance training;Neuromuscular re-education;Patient/family education;Vestibular    PT Next Visit Plan Finish checking LTG, recert 4 more weeks.  Recheck dix-hallpike - still having downbeating? re treat with deep head hang and if nothing changes, likely central - continue with habituation.  progress x1 viewing in standing without UE support, stepping over obstacles, walking with head turns without UE support, add corner balance    Consulted and Agree with Plan of Care Patient;Family member/caregiver    Family Member Consulted wife           Patient will benefit from skilled therapeutic intervention in order to improve the following deficits and impairments:  Decreased balance, Difficulty walking, Dizziness  Visit Diagnosis: Dizziness and giddiness  Unsteadiness on feet  Difficulty in walking, not elsewhere classified     Problem List Patient Active Problem List   Diagnosis Date Noted  . Vertigo 06/08/2019  .  Respiratory tract infection due to COVID-19 virus 02/13/2019  . Acute confusion 02/13/2019  . 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 Environmental and seasonal allergies 05/20/2018  . Postoperative stiffness of total knee replacement ([place]) 01/12/2014  . OA (osteoarthritis) of knee 10/26/2013  . Diabetes mellitus without complication ([place]) 48/02/6551  . OSA (obstructive sleep apnea) 07/21/2010  . Essential hypertension 10/22/2006  . GERD (gastroesophageal reflux disease) 10/22/2006    Rico Junker, PT, DPT 10/14/19    8:11 AM    [facility] [address] [place], [place], [address] Phone: [phone]   Fax:  [phone]  Name: Eric Richmond MRN: [id number] Date of Birth: 07-15-1940

## 2019-10-14 NOTE — Patient Instructions (Signed)
Walking Side to Side Head Motion    Walking on solid surface, one hand on the wall, turn head and eyes to left for __5-6__ steps. Then, turn head and eyes to the right side for _5-6__ steps. Walk backwards repeating head turn to the left and then head turn to the right.  Repeat sequence __4__ times per session. Do __2__ sessions per day.   Up / Down Head Motion    Walking in your hallway with hand on the wall for support, move head and eyes toward ceiling for __5-6__ steps. Then, move head and eyes toward floor for _5-6__ steps.  Walk backwards repeating head up and then head down.   Repeat __4__ laps per session.    Gaze Stabilization: Standing Feet Together    Feet together, keeping eyes on target on wall __3__ feet away, tilt head down 15-30 and move head side to side for __60__ seconds. Repeat while moving head up and down for __60__ seconds. Do __2__ sessions per day.

## 2019-10-19 DIAGNOSIS — H0288A Meibomian gland dysfunction right eye, upper and lower eyelids: Secondary | ICD-10-CM | POA: Diagnosis not present

## 2019-10-19 DIAGNOSIS — H524 Presbyopia: Secondary | ICD-10-CM | POA: Diagnosis not present

## 2019-10-19 DIAGNOSIS — H40023 Open angle with borderline findings, high risk, bilateral: Secondary | ICD-10-CM | POA: Diagnosis not present

## 2019-10-19 DIAGNOSIS — H353111 Nonexudative age-related macular degeneration, right eye, early dry stage: Secondary | ICD-10-CM | POA: Diagnosis not present

## 2019-10-19 DIAGNOSIS — H0288B Meibomian gland dysfunction left eye, upper and lower eyelids: Secondary | ICD-10-CM | POA: Diagnosis not present

## 2019-10-21 ENCOUNTER — Ambulatory Visit: Payer: Medicare Other | Admitting: Physical Therapy

## 2019-10-21 ENCOUNTER — Other Ambulatory Visit: Payer: Self-pay

## 2019-10-21 DIAGNOSIS — R42 Dizziness and giddiness: Secondary | ICD-10-CM

## 2019-10-21 DIAGNOSIS — R262 Difficulty in walking, not elsewhere classified: Secondary | ICD-10-CM | POA: Diagnosis not present

## 2019-10-21 DIAGNOSIS — R2681 Unsteadiness on feet: Secondary | ICD-10-CM | POA: Diagnosis not present

## 2019-10-21 NOTE — Therapy (Signed)
[facility] [address] [place], [place], [address] Phone: [phone]   Fax:  [phone]  Physical Therapy Treatment and Discharge Summary  Patient Details  Name: DERIUS GHOSH MRN: [id number] Date of Birth: 03/22/1940 Referring Provider (PT): Cameron Sprang, MD   Encounter Date: 10/21/2019   PT End of Session - 10/21/19 1114    Visit Number 7    Number of Visits 7    Date for PT Re-Evaluation 10/26/19    Authorization Type [employer] Medicare; $35 copay    PT Start Time 0720    PT Stop Time 0757    PT Time Calculation (min) 37 min    Activity Tolerance Patient tolerated treatment well    Behavior During Therapy [facility] for tasks assessed/performed           Past Medical History:  Diagnosis Date  . Allergy   . Anal fissure 20 years ago  . COVID-19 01/2019  . Diabetes mellitus without complication ([place]) 7/40/8144  . ED (erectile dysfunction)   . Essential hypertension 10/22/2006   Qualifier: Diagnosis of  By: Sarajane Jews MD, Ishmael Holter   . GERD (gastroesophageal reflux disease)   . Hard of hearing   . 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 OSA (obstructive sleep apnea)    per Dr. Gwenette Greet , cpap -2.0  . Osteoarthritis    Right knee and right thumb; s/p total knee replacement  . Pneumonia 2015    Past Surgical History:  Procedure Laterality Date  . ANAL FISSURE REPAIR    . colonoscopy  08/07/2016   per Dr. Hilarie Fredrickson, adenomatous polyps removed, repeat in 3 years   . KNEE ARTHROSCOPY     left knee  . KNEE CLOSED REDUCTION Left 01/13/2014   Procedure: CLOSED MANIPULATION LEFT KNEE;  Surgeon: Gearlean Alf, MD;  Location: [facility];  Service: Orthopedics;  Laterality: Left;  . SURGERY AGE 79 OR 7 ON LEFT KNEE FOR A "GLAND BEHIND THE KNEE"    . TOTAL KNEE ARTHROPLASTY Left 10/26/2013   Procedure: LEFT TOTAL KNEE ARTHROPLASTY;  Surgeon: Gearlean Alf, MD;  Location: [facility];  Service: Orthopedics;  Laterality: Left;     There were no vitals filed for this visit.   Subjective Assessment - 10/21/19 0738    Subjective Did not schedule more visits, wife does not feel patient is any different.  Still reporting he is dizzy and is still taking the medicine daily.    Patient is accompained by: Family member    Pertinent History memory loss, COVID-19 with brain fog, DM, HTN, HOH, OSA, OA with L TKA, PNA    Diagnostic tests MRI - R sphenoid wing lesion; mild/mod cerebral atrophy - Mild Neurocognitive disorder (Alzheimer's disease?)    Currently in Pain? No/denies                   Vestibular Assessment - 10/21/19 0739      Dix-Hallpike Right   Dix-Hallpike Right Duration 0    Dix-Hallpike Right Symptoms No nystagmus      Dix-Hallpike Left   Dix-Hallpike Left Duration >30 seconds    Dix-Hallpike Left Symptoms Downbeat Nystagmus   no change, likely central in nature     Positional Sensitivities   Sit to Supine Lightheadedness    Supine to Left Side Lightheadedness    Supine to Right Side Lightheadedness    Supine to Sitting Lightheadedness    Right Hallpike Lightheadedness    Up from Right Hallpike  Lightheadedness    Up from Left Hallpike Lightheadedness    Nose to Right Knee No dizziness    Right Knee to Sitting No dizziness    Nose to Left Knee No dizziness    Left Knee to Sitting No dizziness    Head Turning x 5 No dizziness    Head Nodding x 5 No dizziness    Pivot Right in Standing No dizziness    Pivot Left in Standing No dizziness    Rolling Right Lightheadedness    Rolling Left Lightheadedness                    [facility] Adult PT Treatment/Exercise - 10/21/19 0800      Therapeutic Activites    Therapeutic Activities Other Therapeutic Activities    Other Therapeutic Activities Long discussion with pt and wife regarding progress and ongoing impairments.  Wife feels pt is not making any progress because he continues to report dizziness daily and continues to take Meclizine  1-2x/day.  Clarified symptoms with patient - pt denies true vertigo but more disequilbrium.  Reviewed with pt and wife that Meclizine is prescribed as needed and is typically indicated for acute, severe dizziness and reviewed side effects of medication.  Encouraged pt and wife to continue to discuss with physician about when it is appropriate to take medication.  Discussed therapy's role in addressing dizziness with movement/exercise - pt has been performing exercises but not consistently and pt performs without wife's supervision which may affect effectiveness due to memory impairments.  Pt feels the dizziness may be something he "just learns to live with."  Pt also indicates that he feels the medicine is helping him more than the exercises.  Pt also wishes to D/C from therapy today.  Following re-assessment of MSQ reviewed with pt and wife results of assessment - pt is negative for positional vertigo and symptoms could be related to medication side effect vs. hypofunction vs. central sensitivity.  Pt and wife verbalized understanding.                  PT Education - 10/21/19 1114    Education Details see TA; D/C today per wife and patient's request    Person(s) Educated Patient;Spouse    Methods Explanation    Comprehension Verbalized understanding            PT Short Term Goals - 09/30/19 0758      PT SHORT TERM GOAL #1   Title Pt will participate in further assessment of falls risk during gait (DGI).    Baseline 18/24    Time 3    Period Weeks    Status Achieved    Target Date 09/18/19      PT SHORT TERM GOAL #2   Title Pt will report 1/5 dizziness with all bed mobility    Baseline 2/5    Time 3    Period Weeks    Status On-going    Target Date 09/18/19      PT SHORT TERM GOAL #3   Title Pt will initiate vestibular and balance HEP with wife's supervision    Time 3    Period Weeks    Status Achieved    Target Date 09/18/19             PT Long Term Goals -  10/21/19 0739      PT LONG TERM GOAL #1   Title Pt will demonstrate ability to perform final HEP with wife's  supervision    Time 6    Period Weeks    Status Partially Met      PT LONG TERM GOAL #2   Title Pt will report no dizziness with bed mobility, sit <> stand, head and body turns, and reaching down to the floor    Time 6    Period Weeks    Status Partially Met      PT LONG TERM GOAL #3   Title Pt will demonstrate 4 point improvement in DGI to indicate decreased falls risk    Baseline 18 > 23/24    Time 6    Period Weeks    Status Achieved      PT LONG TERM GOAL #4   Title Pt will demonstrate negative positional testing    Time 6    Period Weeks    Status Achieved                 Plan - 10/21/19 1115    Clinical Impression Statement Continued assessment of LTG - pt has met 2/4 LTG, partially meeting other 2 goals.  [name] feels that pt has not made progress because he still experiences symptoms of dizziness and continues to take Meclizine.  Reviewed with pt and wife areas where pt has made progress (balance and motion sensitivity with bending forwards, head turns, nods and body turns) and discussed therapy's plan to continue to address remaining areas of impairment.  Despite repeated education regarding Meclizine as prescribed (PRN for acute dizziness), pt believes that the medication has been more effective for his dizziness (taken consistently 1-2x/day) than exercises and pt is pleased with his current functional level.  Pt does not wish to continue with therapy.  Pt is at low falls risk and has returned to walking at [facility].  Pt and wife in agreement for D/C today.  Encouraged pt and wife to continue to discuss medication questions with physician.    Personal Factors and Comorbidities Comorbidity 3+    Comorbidities COVID-19 with brain fog, DM, HTN, HOH, OSA, OA with L TKA, PNA, memory loss, mild neurocognitive disorder    Examination-Activity Limitations Bed  Mobility;Bend;Locomotion Level;Stand    Stability/Clinical Decision Making Stable/Uncomplicated    Rehab Potential Good    PT Frequency 1x / week    PT Duration 8 weeks    PT Treatment/Interventions ADLs/Self Care Home Management;Canalith Repostioning;Gait training;Functional mobility training;Therapeutic activities;Therapeutic exercise;Balance training;Neuromuscular re-education;Patient/family education;Vestibular    PT Next Visit Plan D/C    Consulted and Agree with Plan of Care Patient;Family member/caregiver    Family Member Consulted wife           Patient will benefit from skilled therapeutic intervention in order to improve the following deficits and impairments:  Decreased balance, Difficulty walking, Dizziness  Visit Diagnosis: Dizziness and giddiness  Unsteadiness on feet  Difficulty in walking, not elsewhere classified     Problem List Patient Active Problem List   Diagnosis Date Noted  . Vertigo 06/08/2019  . Respiratory tract infection due to COVID-19 virus 02/13/2019  . Acute confusion 02/13/2019  . 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 Environmental and seasonal allergies 05/20/2018  . Postoperative stiffness of total knee replacement ([place]) 01/12/2014  . OA (osteoarthritis) of knee 10/26/2013  . Diabetes mellitus without complication ([place]) 49/17/9150  . OSA (obstructive sleep apnea) 07/21/2010  . Essential hypertension 10/22/2006  . GERD (gastroesophageal reflux disease) 10/22/2006    PHYSICAL THERAPY DISCHARGE SUMMARY  Visits from Start of Care: 7  Current functional level related to goals / functional outcomes: See impression statement and LTG achievement above.   Remaining deficits: Motion sensitivity with bed mobility, disequilibrium   Education / Equipment: HEP  Plan: Patient agrees to discharge.  Patient goals were partially met. Patient is being discharged due to being pleased with the current functional level.  ?????     Rico Junker, PT, DPT 10/21/19    11:23 AM    [facility] [address] [place], [place], [address] Phone: [phone]   Fax:  [phone]  Name: COLBIE DANNER MRN: [id number] Date of Birth: 1940-06-05

## 2019-11-10 ENCOUNTER — Other Ambulatory Visit: Payer: Self-pay | Admitting: *Deleted

## 2019-11-10 NOTE — Patient Outreach (Signed)
[facility]) Care Management  [place]  11/10/2019   JAEDON SILER Apr 13, 1940 [id number]  Subjective: Successful telephone outreach call to patient's wife [name]. HIPAA identifiers obtained. Per wife, the patient is doing very well. He was cleared to drive and he has been going to gym and meeting a friend to play pool which has uplifted his spirits. Ms. Mcbreen states that the patient's B/P has been at a steady range of 130's over 80's and that he is eating a low sodium diet. She explains that PT for vertigo did not help but the patient is not complaining of dizziness as much. Nurse did remind wife that both he and she should get their annual high dose flu shot. Ms. Stiefel stated she would make an appointment. Also, discussed the patient maintaining his weight. His wife reports that he does have a good appetite but she does not want him to lose any weight. Nurse suggested Ensure supplement and will send the patient coupons. Ms. Havey did not have any further questions or concerns and did confirm that she has this nurse's contact information if needed.  Encounter Medications:  Outpatient Encounter Medications as of 11/10/2019  Medication Sig  . albuterol (VENTOLIN HFA) 108 (90 Base) MCG/ACT inhaler Inhale 2 puffs into the lungs every 6 (six) hours as needed for wheezing or shortness of breath.  Marland Kitchen amLODipine (NORVASC) 5 MG tablet Take 1 tablet by mouth once daily  . ascorbic acid (VITAMIN C) 500 MG tablet Take 1 tablet (500 mg total) by mouth daily.  Marland Kitchen aspirin EC 81 MG tablet Take 81 mg by mouth daily.  Marland Kitchen escitalopram (LEXAPRO) 10 MG tablet Take 1 tablet (10 mg total) by mouth at bedtime.  . fluticasone (FLONASE) 50 MCG/ACT nasal spray Place 2 sprays into both nostrils daily.  Marland Kitchen losartan-hydrochlorothiazide (HYZAAR) 100-25 MG tablet TAKE 1 TABLET BY MOUTH ONCE DAILY IN THE MORNING  . meclizine (ANTIVERT) 25 MG tablet Take 1 tablet (25 mg total) by mouth every 4 (four)  hours as needed for dizziness.  . memantine (NAMENDA) 10 MG tablet Take 1 tablet (10 mg total) by mouth 2 (two) times daily.  . Multiple Vitamin (MULTIVITAMIN WITH MINERALS) TABS tablet Take 1 tablet by mouth daily.  . pantoprazole (PROTONIX) 40 MG tablet Take 1 tablet by mouth once daily  . [DISCONTINUED] atenolol (TENORMIN) 50 MG tablet TAKE 1 TABLET EVERY DAY (NEED MD APPOINTMENT FOR REFILLS) (Patient taking differently: Take 50 mg by mouth daily. )   No facility-administered encounter medications on file as of 11/10/2019.    Functional Status:  In your present state of health, do you have any difficulty performing the following activities: 06/11/2019 02/23/2019  Hearing? Tempie Donning  Comment has hearing aids has hearing aids  Vision? N N  Difficulty concentrating or making decisions? Y N  Comment diagnosis of dementia -  Walking or climbing stairs? N N  Dressing or bathing? N N  Doing errands, shopping? Tempie Donning  Comment wife assist due to dementia wife takes  Conservation officer, nature and eating ? Y Y  Comment unable to prepare home-cooked meals, and do simple meals -  Using the Toilet? N N  In the past six months, have you accidently leaked urine? N N  Do you have problems with loss of bowel control? N N  Managing your Medications? Tempie Donning  Comment wife manages wife completes medications  Managing your Finances? Tempie Donning  Comment wife manages Wife does  Housekeeping or managing  your Housekeeping? N Y  Comment patient is doing yard work wife handles  Some recent data might be hidden    Fall/Depression Screening: Fall Risk  11/10/2019 09/09/2019 09/09/2019  Falls in the past year? 0 0 0  Number falls in past yr: 0 0 0  Injury with Fall? 0 0 0  Risk for fall due to : - Other (Comment) -  Risk for fall due to: Comment - persistant dizziness due to vertigo -  Follow up - Falls prevention discussed;Education provided;Falls evaluation completed Falls prevention discussed;Education provided;Falls evaluation  completed   PHQ 2/9 Scores 07/09/2019 06/11/2019 02/23/2019 08/13/2017 08/09/2016 11/18/2014  PHQ - 2 Score 0 0 0 0 1 0   Goals Addressed            This Visit's Progress   . Patient's B/P will be maintained below 150/90 per wife's verbalization within the next 90 days   On track    [place] (see longtitudinal plan of care for additional care plan information)  Objective:  . Last practice recorded BP readings:  BP Readings from Last 3 Encounters:  08/27/19 (!) 154/92  07/09/19 131/82  07/08/19 128/66 .   Marland Kitchen Most recent eGFR/CrCl: No results found for: EGFR  No components found for: CRCL  Current Barriers:  Marland Kitchen Knowledge deficit related to self care management of hypertension  Case Manager Clinical Goal(s):  Marland Kitchen Over the next 90 days, patient will verbalize understanding of plan for hypertension management . Over the next 90 days, patient will demonstrate improved adherence to prescribed treatment plan for hypertension as evidenced by taking all medications as prescribed, monitoring and recording blood pressure as directed, adhering to low sodium/DASH diet  Interventions:  . Evaluation of current treatment plan related to hypertension self management and patient's adherence to plan as established by provider. . Reviewed medications with patient and discussed importance of compliance . Discussed plans with patient for ongoing care management follow up and provided patient with direct contact information for care management team . Encouraged patient to continue to exercise and eat a low sodium diet  Patient Self Care Activities:  . Self administers medications as prescribed . Attends all scheduled provider appointments . Calls provider office for new concerns, questions, or BP outside discussed parameters . Monitors BP and records as discussed . Adheres to a low sodium diet/DASH diet . Increase physical activity as tolerated . Patient's wife assists and manages patient's  hypertension due to patient having dementia.   Please see past updates related to this goal by clicking on the "Past Updates" button in the selected goal            Plan: [place] will send PCP today's assessment note, will send Ensure Coupons, will call patient within the month of November, and patient agrees to future outreach calls.   [name] RN, BSN [facility] [phone] [email or web address]

## 2019-11-19 ENCOUNTER — Other Ambulatory Visit: Payer: Self-pay | Admitting: Family Medicine

## 2019-11-20 ENCOUNTER — Other Ambulatory Visit: Payer: Self-pay | Admitting: Family Medicine

## 2019-11-20 NOTE — Telephone Encounter (Signed)
Warning for use in geriatric patients.  Ordered 08/08/19 #60 x3.   Last OV 06/2019

## 2019-11-25 ENCOUNTER — Other Ambulatory Visit: Payer: Self-pay | Admitting: Family Medicine

## 2019-12-04 ENCOUNTER — Ambulatory Visit (INDEPENDENT_AMBULATORY_CARE_PROVIDER_SITE_OTHER): Payer: Medicare Other

## 2019-12-04 DIAGNOSIS — Z0000 Encounter for general adult medical examination without abnormal findings: Secondary | ICD-10-CM

## 2019-12-04 NOTE — Progress Notes (Addendum)
Subjective:   Eric Richmond is a 79 y.o. male who presents for Medicare Annual/Subsequent preventive examination.  I connected with Kinneth Fujiwara today by telephone and verified that I am speaking with the correct person using two identifiers. Location patient: home Location provider: work Persons participating in the virtual visit: patient, provider.   I discussed the limitations, risks, security and privacy concerns of performing an evaluation and management service by telephone and the availability of in person appointments. I also discussed with the patient that there may be a patient responsible charge related to this service. The patient expressed understanding and verbally consented to this telephonic visit.    Interactive audio and video telecommunications were attempted between this provider and patient, however failed, due to patient having technical difficulties OR patient did not have access to video capability.  We continued and completed visit with audio only.      Review of Systems    N/A        Objective:    There were no vitals filed for this visit. There is no height or weight on file to calculate BMI.  Advanced Directives 12/04/2019 08/27/2019 08/11/2019 07/09/2019 06/11/2019 05/02/2019 02/23/2019  Does Patient Have a Medical Advance Directive? No Yes No No No Yes No  Type of Advance Directive - [place];Living will - - - Living will;Healthcare Power of Attorney -  Does patient want to make changes to medical advance directive? - - No - Patient declined - - No - Patient declined No - Patient declined  Would patient like information on creating a medical advance directive? No - Patient declined - No - Patient declined - No - Patient declined No - Patient declined -    Current Medications (verified) Outpatient Encounter Medications as of 12/04/2019  Medication Sig  . amLODipine (NORVASC) 5 MG tablet Take 1 tablet by mouth once daily  . ascorbic  acid (VITAMIN C) 500 MG tablet Take 1 tablet (500 mg total) by mouth daily.  Marland Kitchen aspirin EC 81 MG tablet Take 81 mg by mouth daily.  . fluticasone (FLONASE) 50 MCG/ACT nasal spray Place 2 sprays into both nostrils daily.  Marland Kitchen losartan-hydrochlorothiazide (HYZAAR) 100-25 MG tablet TAKE 1 TABLET BY MOUTH ONCE DAILY IN THE MORNING  . memantine (NAMENDA) 10 MG tablet Take 1 tablet (10 mg total) by mouth 2 (two) times daily.  . Multiple Vitamin (MULTIVITAMIN WITH MINERALS) TABS tablet Take 1 tablet by mouth daily.  . pantoprazole (PROTONIX) 40 MG tablet Take 1 tablet by mouth once daily  . albuterol (VENTOLIN HFA) 108 (90 Base) MCG/ACT inhaler Inhale 2 puffs into the lungs every 6 (six) hours as needed for wheezing or shortness of breath. (Patient not taking: Reported on 12/04/2019)  . escitalopram (LEXAPRO) 10 MG tablet Take 1 tablet (10 mg total) by mouth at bedtime. (Patient not taking: Reported on 12/04/2019)  . meclizine (ANTIVERT) 25 MG tablet TAKE 1 TABLET BY MOUTH EVERY 4 HOURS AS NEEDED FOR  DIZZINESS (Patient not taking: Reported on 12/04/2019)  . [DISCONTINUED] atenolol (TENORMIN) 50 MG tablet TAKE 1 TABLET EVERY DAY (NEED MD APPOINTMENT FOR REFILLS) (Patient taking differently: Take 50 mg by mouth daily. )   No facility-administered encounter medications on file as of 12/04/2019.    Allergies (verified) Oxycodone and Tramadol   History: Past Medical History:  Diagnosis Date  . Allergy   . Anal fissure 20 years ago  . COVID-19 01/2019  . Diabetes mellitus without complication ([place]) 07/06/2583  .  ED (erectile dysfunction)   . Essential hypertension 10/22/2006   Qualifier: Diagnosis of  By: Sarajane Jews MD, Ishmael Holter   . GERD (gastroesophageal reflux disease)   . Hard of hearing   . 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 OSA (obstructive sleep apnea)    per Dr. Gwenette Greet , cpap -2.0  . Osteoarthritis    Right knee and right thumb; s/p total knee replacement  . Pneumonia 2015   Past Surgical  History:  Procedure Laterality Date  . ANAL FISSURE REPAIR    . colonoscopy  08/07/2016   per Dr. Hilarie Fredrickson, adenomatous polyps removed, repeat in 3 years   . KNEE ARTHROSCOPY     left knee  . KNEE CLOSED REDUCTION Left 01/13/2014   Procedure: CLOSED MANIPULATION LEFT KNEE;  Surgeon: Gearlean Alf, MD;  Location: [facility];  Service: Orthopedics;  Laterality: Left;  . SURGERY AGE 59 OR 7 ON LEFT KNEE FOR A "GLAND BEHIND THE KNEE"    . TOTAL KNEE ARTHROPLASTY Left 10/26/2013   Procedure: LEFT TOTAL KNEE ARTHROPLASTY;  Surgeon: Gearlean Alf, MD;  Location: [facility];  Service: Orthopedics;  Laterality: Left;   Family History  Problem Relation Age of Onset  . Breast cancer Sister   . Diabetes Sister   . Hypertension Sister   . Thyroid cancer Sister   . Diabetes Mother   . Hypertension Mother   . Diabetes Father   . Hypertension Father   . Diabetes Sister   . Hypertension Other   . Diabetes Brother   . Hypertension Brother   . Heart failure Brother   . Heart failure Brother   . Diabetes Sister   . Colon cancer Neg Hx   . Esophageal cancer Neg Hx   . Rectal cancer Neg Hx   . Stomach cancer Neg Hx    Social History   Socioeconomic History  . Marital status: Married    Spouse name: [name]  . Number of children: 2  . Years of education: 85  . Highest education level: High school graduate  Occupational History  . Occupation: retired  Tobacco Use  . Smoking status: Former Smoker    Packs/day: 0.30    Years: 20.00    Pack years: 6.00    Types: Cigarettes    Quit date: 02/26/1977    Years since quitting: 42.7  . Smokeless tobacco: Former Systems developer    Types: Secondary school teacher  . Vaping Use: Never used  Substance and Sexual Activity  . Alcohol use: No    Alcohol/week: 0.0 standard drinks  . Drug use: No  . Sexual activity: Not Currently    Partners: Female  Other Topics Concern  . Not on file  Social History Narrative   Epworth Sleepiness Scale = 5 (12/14/2014)      Right  handed      Graduated HS      Lives with wife   Social Determinants of Health   Financial Resource Strain: Low Risk   . Difficulty of Paying Living Expenses: Not hard at all  Food Insecurity: No Food Insecurity  . Worried About Charity fundraiser in the Last Year: Never true  . Ran Out of Food in the Last Year: Never true  Transportation Needs: No Transportation Needs  . Lack of Transportation (Medical): No  . Lack of Transportation (Non-Medical): No  Physical Activity: Sufficiently Active  . Days of Exercise per Week: 3 days  . Minutes of Exercise per Session: 60 min  Stress: No Stress Concern Present  . Feeling of Stress : Not at all  Social Connections: Moderately Integrated  . Frequency of Communication with Friends and Family: More than three times a week  . Frequency of Social Gatherings with Friends and Family: Three times a week  . Attends Religious Services: More than 4 times per year  . Active Member of Clubs or Organizations: No  . Attends Archivist Meetings: Never  . Marital Status: Married    Tobacco Counseling Counseling given: Not Answered   Clinical Intake:  Pre-visit preparation completed: Yes  Pain : No/denies pain     Nutritional Risks: None Diabetes: Yes CBG done?: No Did pt. bring in CBG monitor from home?: No  How often do you need to have someone help you when you read instructions, pamphlets, or other written materials from your doctor or pharmacy?: 1 - Never What is the last grade level you completed in school?: 12th grade  Diabetic?yes  Interpreter Needed?: No  Information entered by :: [place] of Daily Living In your present state of health, do you have any difficulty performing the following activities: 12/04/2019 06/11/2019  Hearing? Y Y  Comment Has difficulties hearing, has hearing aids but does not like wearing them states they do not work well has hearing aids  Vision? N N  Difficulty  concentrating or making decisions? Y Y  Comment - diagnosis of dementia  Walking or climbing stairs? N N  Dressing or bathing? N N  Doing errands, shopping? Y Y  Comment - wife assist due to dementia  Preparing Food and eating ? N Y  Comment - unable to prepare home-cooked meals, and do simple meals  Using the Toilet? N N  In the past six months, have you accidently leaked urine? N N  Do you have problems with loss of bowel control? N N  Managing your Medications? N Y  Comment - wife manages  Managing your Finances? N Y  Comment - wife Engineer, production or managing your Housekeeping? N N  Comment - patient is doing yard work  Some recent data might be hidden    Patient Care Team: [name], MD as PCP - General Delice Lesch Lezlie Octave, MD as Consulting Physician (Neurology) Laretta Alstrom, Argie Ramming, RN as [place], [place], [facility] as Pharmacist (Pharmacist)  Indicate any recent Medical Services you may have received from other than [facility] providers in the past year (date may be approximate).     Assessment:   This is a routine wellness examination for Latif.  Hearing/Vision screen  Hearing Screening   _0  _1  _2  _3  _4  _5  _6  _7  _8   Right ear:           Left ear:           Vision Screening Comments: Patients gets eye examined once per year  Dietary issues and exercise activities discussed: Current Exercise Habits: Home exercise routine, Type of exercise: walking, Time (Minutes): 60, Frequency (Times/Week): 3, Weekly Exercise (Minutes/Week): 180, Intensity: Mild  Goals    . Exercise 150 min/wk Moderate Activity     Will get back to the gym 3 days . This will help your pre -diabetic numbers   Educated regarding prediabetes and numbers;  A1c ranges from 5.8 to 6.5 or fasting Blood sugar > 115 -126; (126 is diabetic)   Risk: >45yo; family hx; overweight or obese; African American; Hispanic; Latino; American [place];  [place]; [place]  [place]; history of diabetes when pregnant; or birth to a baby weighing over 9 lbs. Being less physically active than 30 minutes; 3 times a week;   Prevention; Losing a modest 7 to 8 lbs; If over 200 lbs; 10 to 14 lbs;  Choose healthier foods; colorful veggies; fish or lean meats; drinks water Reduce portion size Start exercising; 30 minutes of fast walking x 30 minutes per day/ 60 min for weight loss          . Patient's B/P will be maintained below 150/90 per wife's verbalization within the next 90 days     [place] (see longtitudinal plan of care for additional care plan information)  Objective:  . Last practice recorded BP readings:  BP Readings from Last 3 Encounters:  08/27/19 (!) 154/92  07/09/19 131/82  07/08/19 128/66 .   Marland Kitchen Most recent eGFR/CrCl: No results found for: EGFR  No components found for: CRCL  Current Barriers:  Marland Kitchen Knowledge deficit related to self care management of hypertension  Case Manager Clinical Goal(s):  Marland Kitchen Over the next 90 days, patient will verbalize understanding of plan for hypertension management . Over the next 90 days, patient will demonstrate improved adherence to prescribed treatment plan for hypertension as evidenced by taking all medications as prescribed, monitoring and recording blood pressure as directed, adhering to low sodium/DASH diet  Interventions:  . Evaluation of current treatment plan related to hypertension self management and patient's adherence to plan as established by provider. . Reviewed medications with patient and discussed importance of compliance . Discussed plans with patient for ongoing care management follow up and provided patient with direct contact information for care management team . Encouraged patient to continue to exercise and eat a low sodium diet  Patient Self Care Activities:  . Self administers medications as prescribed . Attends all scheduled provider  appointments . Calls provider office for new concerns, questions, or BP outside discussed parameters . Monitors BP and records as discussed . Adheres to a low sodium diet/DASH diet . Increase physical activity as tolerated . Patient's wife assists and manages patient's hypertension due to patient having dementia.   Please see past updates related to this goal by clicking on the "Past Updates" button in the selected goal          . Spend more time traveling.      Depression Screen PHQ 2/9 Scores 12/04/2019 07/09/2019 06/11/2019 02/23/2019 08/13/2017 08/09/2016 11/18/2014  PHQ - 2 Score 0 0 0 0 0 1 0  PHQ- 9 Score 0 - - - - - -    Fall Risk Fall Risk  12/04/2019 11/10/2019 09/09/2019 09/09/2019 07/09/2019  Falls in the past year? 0 0 0 0 0  Number falls in past yr: 0 0 0 0 0  Injury with Fall? 0 0 0 0 0  Risk for fall due to : No Fall Risks - Other (Comment) - -  Risk for fall due to: Comment - - persistant dizziness due to vertigo - -  Follow up Falls evaluation completed;Falls prevention discussed - Falls prevention discussed;Education provided;Falls evaluation completed Falls prevention discussed;Education provided;Falls evaluation completed -    Any stairs in or around the home? No  If so, are there any without handrails? No  Home free of loose throw rugs in walkways, pet beds, electrical cords, etc? Yes  Adequate lighting in your home to reduce risk of falls? Yes   ASSISTIVE DEVICES UTILIZED TO PREVENT FALLS:  Life alert? No  Use of a cane, walker or w/c? No  Grab bars in the bathroom? Yes  Shower chair or bench in shower? No  Elevated toilet seat or a handicapped toilet? No     Cognitive Function: Cognitive screening not performed today as patient is noted to have cognitive impairment and patient's wife assisted with questions  MMSE - Mini Mental State Exam 08/13/2017 08/09/2016  Orientation to time 4 4  Orientation to time comments - Disoriented to date.  Orientation to  Place 5 5  Registration 3 3  Attention/ Calculation 4 4  Recall 2 1  Language- name 2 objects 2 2  Language- repeat 1 1  Language- follow 3 step command 3 3  Language- read & follow direction 1 1  Write a sentence 1 1  Copy design 0 0  Total score 26 25   Montreal Cognitive Assessment  12/02/2018 04/25/2018  Visuospatial/ Executive (0/5) 1 1  Naming (0/3) 2 3  Attention: Read list of digits (0/2) 1 1  Attention: Read list of letters (0/1) 1 1  Attention: Serial 7 subtraction starting at 100 (0/3) 2 1  Language: Repeat phrase (0/2) 1 1  Language : Fluency (0/1) 0 0  Abstraction (0/2) 0 0  Delayed Recall (0/5) 0 0  Orientation (0/6) 4 2  Total 12 10  Adjusted Score (based on education) 13 11      Immunizations Immunization History  Administered Date(s) Administered  . Fluad Quad(high Dose 65+) 12/15/2018  . Influenza Split 01/10/2013  . Influenza Whole 11/21/2009, 01/17/2010  . Influenza, High Dose Seasonal PF 11/18/2014, 12/30/2015, 12/12/2016  . Influenza,inj,Quad PF,6+ Mos 12/15/2013  . Pneumococcal Conjugate-13 12/15/2013  . Pneumococcal Polysaccharide-23 06/02/2015  . Tdap 12/12/2017, 12/16/2017    TDAP status: Up to date Flu Vaccine status: Up to date Pneumococcal vaccine status: Completed during today's visit. Covid-19 vaccine status: Completed vaccines  Qualifies for Shingles Vaccine? Yes   Zostavax completed No   Shingrix Completed?: No.    Education has been provided regarding the importance of this vaccine. Patient has been advised to call insurance company to determine out of pocket expense if they have not yet received this vaccine. Advised may also receive vaccine at local pharmacy or Health Dept. Verbalized acceptance and understanding.  Screening Tests Health Maintenance  Topic Date Due  . Hepatitis C Screening  Never done  . FOOT EXAM  Never done  . OPHTHALMOLOGY EXAM  Never done  . COVID-19 Vaccine (1) Never done  . HEMOGLOBIN A1C  06/15/2019  .  INFLUENZA VACCINE  09/27/2019  . COLONOSCOPY  04/09/2022  . TETANUS/TDAP  12/17/2027  . PNA vac Low Risk Adult  Completed    Health Maintenance  Health Maintenance Due  Topic Date Due  . Hepatitis C Screening  Never done  . FOOT EXAM  Never done  . OPHTHALMOLOGY EXAM  Never done  . COVID-19 Vaccine (1) Never done  . HEMOGLOBIN A1C  06/15/2019  . INFLUENZA VACCINE  09/27/2019    Colorectal cancer screening: Completed 04/10/2019. Repeat every 3 years  Lung Cancer Screening: (Low Dose CT Chest recommended if Age 40-80 years, 30 pack-year currently smoking OR have quit w/in 15years.) does not qualify.   Lung Cancer Screening Referral: N/A   Additional Screening:  Hepatitis C Screening: does not qualify;   Vision Screening: Recommended annual ophthalmology exams for early detection of glaucoma and other disorders of the eye. Is the patient up to date with their annual eye exam?  Yes  Who is the provider or what is the name of the office in which the patient attends annual eye exams? Dr. Katy Fitch  If pt is not established with a provider, would they like to be referred to a provider to establish care? No .   Dental Screening: Recommended annual dental exams for proper oral hygiene  Community Resource Referral / Chronic Care Management: CRR required this visit?  No   CCM required this visit?  No      Plan:     I have personally reviewed and noted the following in the patient's chart:   . Medical and social history . Use of alcohol, tobacco or illicit drugs  . Current medications and supplements . Functional ability and status . Nutritional status . Physical activity . Advanced directives . List of other physicians . Hospitalizations, surgeries, and ER visits in previous 12 months . Vitals . Screenings to include cognitive, depression, and falls . Referrals and appointments  In addition, I have reviewed and discussed with patient certain preventive protocols, quality  metrics, and best practice recommendations. A written personalized care plan for preventive services as well as general preventive health recommendations were provided to patient.     Ofilia Neas, LPN   42/10/[id number]   Nurse Notes: None

## 2019-12-04 NOTE — Patient Instructions (Signed)
[name] , Thank you for taking time to come for your Medicare Wellness Visit. I appreciate your ongoing commitment to your health goals. Please review the following plan we discussed and let me know if I can assist you in the future.   Screening recommendations/referrals: Colonoscopy: Up to date, next due 04/09/2022 Recommended yearly ophthalmology/optometry visit for glaucoma screening and checkup Recommended yearly dental visit for hygiene and checkup  Vaccinations: Influenza vaccine: up to date, next due fall 2022 Pneumococcal vaccine: Completed series  Tdap vaccine: Up to date, next due 12/17/2027 Shingles vaccine: Currently due, you may receive this vaccine at your local pharmacy.    Advanced directives: Advance directive discussed with you today. Even though you declined this today please call our office should you change your mind and we can give you the proper paperwork for you to fill out.   Conditions/risks identified: None   Next appointment: 12/09/2019 @ 8:30 am with [name]   Preventive Care 65 Years and Older, Male Preventive care refers to lifestyle choices and visits with your health care provider that can promote health and wellness. What does preventive care include?  A yearly physical exam. This is also called an annual well check.  Dental exams once or twice a year.  Routine eye exams. Ask your health care provider how often you should have your eyes checked.  Personal lifestyle choices, including:  Daily care of your teeth and gums.  Regular physical activity.  Eating a healthy diet.  Avoiding tobacco and drug use.  Limiting alcohol use.  Practicing safe sex.  Taking low doses of aspirin every day.  Taking vitamin and mineral supplements as recommended by your health care provider. What happens during an annual well check? The services and screenings done by your health care provider during your annual well check will depend on your age,  overall health, lifestyle risk factors, and family history of disease. Counseling  Your health care provider may ask you questions about your:  Alcohol use.  Tobacco use.  Drug use.  Emotional well-being.  Home and relationship well-being.  Sexual activity.  Eating habits.  History of falls.  Memory and ability to understand (cognition).  Work and work Statistician. Screening  You may have the following tests or measurements:  Height, weight, and BMI.  Blood pressure.  Lipid and cholesterol levels. These may be checked every 5 years, or more frequently if you are over 83 years old.  Skin check.  Lung cancer screening. You may have this screening every year starting at age 58 if you have a 30-pack-year history of smoking and currently smoke or have quit within the past 15 years.  Fecal occult blood test (FOBT) of the stool. You may have this test every year starting at age 63.  Flexible sigmoidoscopy or colonoscopy. You may have a sigmoidoscopy every 5 years or a colonoscopy every 10 years starting at age 34.  Prostate cancer screening. Recommendations will vary depending on your family history and other risks.  Hepatitis C blood test.  Hepatitis B blood test.  Sexually transmitted disease (STD) testing.  Diabetes screening. This is done by checking your blood sugar (glucose) after you have not eaten for a while (fasting). You may have this done every 1-3 years.  Abdominal aortic aneurysm (AAA) screening. You may need this if you are a current or former smoker.  Osteoporosis. You may be screened starting at age 71 if you are at high risk. Talk with your health care provider  about your test results, treatment options, and if necessary, the need for more tests. Vaccines  Your health care provider may recommend certain vaccines, such as:  Influenza vaccine. This is recommended every year.  Tetanus, diphtheria, and acellular pertussis (Tdap, Td) vaccine. You may  need a Td booster every 10 years.  Zoster vaccine. You may need this after age 74.  Pneumococcal 13-valent conjugate (PCV13) vaccine. One dose is recommended after age 31.  Pneumococcal polysaccharide (PPSV23) vaccine. One dose is recommended after age 26. Talk to your health care provider about which screenings and vaccines you need and how often you need them. This information is not intended to replace advice given to you by your health care provider. Make sure you discuss any questions you have with your health care provider. Document Released: 03/11/2015 Document Revised: 11/02/2015 Document Reviewed: 12/14/2014 Elsevier Interactive Patient Education  2017 [employer] Prevention in the Home Falls can cause injuries. They can happen to people of all ages. There are many things you can do to make your home safe and to help prevent falls. What can I do on the outside of my home?  Regularly fix the edges of walkways and driveways and fix any cracks.  Remove anything that might make you trip as you walk through a door, such as a raised step or threshold.  Trim any bushes or trees on the path to your home.  Use bright outdoor lighting.  Clear any walking paths of anything that might make someone trip, such as rocks or tools.  Regularly check to see if handrails are loose or broken. Make sure that both sides of any steps have handrails.  Any raised decks and porches should have guardrails on the edges.  Have any leaves, snow, or ice cleared regularly.  Use sand or salt on walking paths during winter.  Clean up any spills in your garage right away. This includes oil or grease spills. What can I do in the bathroom?  Use night lights.  Install grab bars by the toilet and in the tub and shower. Do not use towel bars as grab bars.  Use non-skid mats or decals in the tub or shower.  If you need to sit down in the shower, use a plastic, non-slip stool.  Keep the floor  dry. Clean up any water that spills on the floor as soon as it happens.  Remove soap buildup in the tub or shower regularly.  Attach bath mats securely with double-sided non-slip rug tape.  Do not have throw rugs and other things on the floor that can make you trip. What can I do in the bedroom?  Use night lights.  Make sure that you have a light by your bed that is easy to reach.  Do not use any sheets or blankets that are too big for your bed. They should not hang down onto the floor.  Have a firm chair that has side arms. You can use this for support while you get dressed.  Do not have throw rugs and other things on the floor that can make you trip. What can I do in the kitchen?  Clean up any spills right away.  Avoid walking on wet floors.  Keep items that you use a lot in easy-to-reach places.  If you need to reach something above you, use a strong step stool that has a grab bar.  Keep electrical cords out of the way.  Do not use floor polish or  wax that makes floors slippery. If you must use wax, use non-skid floor wax.  Do not have throw rugs and other things on the floor that can make you trip. What can I do with my stairs?  Do not leave any items on the stairs.  Make sure that there are handrails on both sides of the stairs and use them. Fix handrails that are broken or loose. Make sure that handrails are as long as the stairways.  Check any carpeting to make sure that it is firmly attached to the stairs. Fix any carpet that is loose or worn.  Avoid having throw rugs at the top or bottom of the stairs. If you do have throw rugs, attach them to the floor with carpet tape.  Make sure that you have a light switch at the top of the stairs and the bottom of the stairs. If you do not have them, ask someone to add them for you. What else can I do to help prevent falls?  Wear shoes that:  Do not have high heels.  Have rubber bottoms.  Are comfortable and fit you  well.  Are closed at the toe. Do not wear sandals.  If you use a stepladder:  Make sure that it is fully opened. Do not climb a closed stepladder.  Make sure that both sides of the stepladder are locked into place.  Ask someone to hold it for you, if possible.  Clearly mark and make sure that you can see:  Any grab bars or handrails.  First and last steps.  Where the edge of each step is.  Use tools that help you move around (mobility aids) if they are needed. These include:  Canes.  Walkers.  Scooters.  Crutches.  Turn on the lights when you go into a dark area. Replace any light bulbs as soon as they burn out.  Set up your furniture so you have a clear path. Avoid moving your furniture around.  If any of your floors are uneven, fix them.  If there are any pets around you, be aware of where they are.  Review your medicines with your doctor. Some medicines can make you feel dizzy. This can increase your chance of falling. Ask your doctor what other things that you can do to help prevent falls. This information is not intended to replace advice given to you by your health care provider. Make sure you discuss any questions you have with your health care provider. Document Released: 12/09/2008 Document Revised: 07/21/2015 Document Reviewed: 03/19/2014 Elsevier Interactive Patient Education  2017 [employer].

## 2019-12-08 ENCOUNTER — Other Ambulatory Visit: Payer: Self-pay

## 2019-12-09 ENCOUNTER — Ambulatory Visit (INDEPENDENT_AMBULATORY_CARE_PROVIDER_SITE_OTHER): Payer: Medicare Other | Admitting: Family Medicine

## 2019-12-09 ENCOUNTER — Encounter: Payer: Self-pay | Admitting: Family Medicine

## 2019-12-09 VITALS — BP 130/80 | HR 63 | Temp 98.8°F | Ht 68.0 in | Wt 180.6 lb

## 2019-12-09 DIAGNOSIS — I10 Essential (primary) hypertension: Secondary | ICD-10-CM

## 2019-12-09 DIAGNOSIS — E119 Type 2 diabetes mellitus without complications: Secondary | ICD-10-CM

## 2019-12-09 NOTE — Progress Notes (Signed)
   Subjective:    Patient ID: Eric Richmond, male    DOB: Dec 08, 1940, 79 y.o.   MRN: [id number]  HPI Here to follow up. He feels well. His BP at home has been stable. He is fasting for labs.    Review of Systems  Constitutional: Negative.   Respiratory: Negative.   Cardiovascular: Negative.        Objective:   Physical Exam Constitutional:      Appearance: Normal appearance.  Cardiovascular:     Rate and Rhythm: Normal rate and regular rhythm.     Pulses: Normal pulses.     Heart sounds: Normal heart sounds.  Pulmonary:     Effort: Pulmonary effort is normal.     Breath sounds: Normal breath sounds.  Neurological:     Mental Status: He is alert.           Assessment & Plan:  His HTN is stable. Get fasting labs to check lipids and an A1c. Alysia Penna, MD

## 2019-12-09 NOTE — Addendum Note (Signed)
Addended by: Marrion Coy on: 12/09/2019 09:58 AM   Modules accepted: Orders

## 2019-12-10 LAB — HEPATIC FUNCTION PANEL
AG Ratio: 1.9 (calc) (ref 1.0–2.5)
ALT: 11 U/L (ref 9–46)
AST: 18 U/L (ref 10–35)
Albumin: 4.2 g/dL (ref 3.6–5.1)
Alkaline phosphatase (APISO): 74 U/L (ref 35–144)
Bilirubin, Direct: 0.2 mg/dL (ref 0.0–0.2)
Globulin: 2.2 g/dL (calc) (ref 1.9–3.7)
Indirect Bilirubin: 0.7 mg/dL (calc) (ref 0.2–1.2)
Total Bilirubin: 0.9 mg/dL (ref 0.2–1.2)
Total Protein: 6.4 g/dL (ref 6.1–8.1)

## 2019-12-10 LAB — LIPID PANEL
Cholesterol: 154 mg/dL (ref <200)
HDL: 51 mg/dL (ref >=40)
LDL Cholesterol (Calc): 86 mg/dL (calc)
Non-HDL Cholesterol (Calc): 103 mg/dL (calc) (ref <130)
Total CHOL/HDL Ratio: 3 (calc) (ref <5.0)
Triglycerides: 79 mg/dL (ref <150)

## 2019-12-10 LAB — HEMOGLOBIN A1C
Hgb A1c MFr Bld: 5.9 % of total Hgb — ABNORMAL HIGH (ref <5.7)
Mean Plasma Glucose: 123 (calc)
eAG (mmol/L): 6.8 (calc)

## 2019-12-23 ENCOUNTER — Other Ambulatory Visit: Payer: Self-pay | Admitting: Family Medicine

## 2020-01-12 ENCOUNTER — Other Ambulatory Visit: Payer: Self-pay | Admitting: *Deleted

## 2020-01-12 NOTE — Patient Outreach (Signed)
[facility]) Care Management  01/12/2020  Brennan Litzinger Hodsdon [birth year] [id number]  Unsuccessful outreach attempt made to patient. RN Health Coach left HIPAA compliant voicemail message along with her contact information.  Plan: RN Health Coach will call patient within the month of December  [place], [place] [phone] [email or web address]

## 2020-01-13 DIAGNOSIS — H903 Sensorineural hearing loss, bilateral: Secondary | ICD-10-CM | POA: Diagnosis not present

## 2020-01-20 ENCOUNTER — Other Ambulatory Visit: Payer: Self-pay

## 2020-01-20 ENCOUNTER — Encounter: Payer: Self-pay | Admitting: Neurology

## 2020-01-20 ENCOUNTER — Telehealth (INDEPENDENT_AMBULATORY_CARE_PROVIDER_SITE_OTHER): Payer: Medicare Other | Admitting: Neurology

## 2020-01-20 VITALS — Ht 68.0 in | Wt 172.0 lb

## 2020-01-20 DIAGNOSIS — R42 Dizziness and giddiness: Secondary | ICD-10-CM

## 2020-01-20 DIAGNOSIS — G3184 Mild cognitive impairment, so stated: Secondary | ICD-10-CM | POA: Diagnosis not present

## 2020-01-20 NOTE — Progress Notes (Signed)
Telephone (Audio) Visit The purpose of this telephone visit is to provide medical care while limiting exposure to the novel coronavirus.    Consent was obtained for telephone visit:  Yes.   Answered questions that patient had about telehealth interaction:  Yes.   I discussed the limitations, risks, security and privacy concerns of performing an evaluation and management service by telephone. I also discussed with the patient that there may be a patient responsible charge related to this service. The patient expressed understanding and agreed to proceed.  Pt location: Home Physician Location: office Name of referring provider:  [name], MD I connected with .Gaither L Ostroff at patients initiation/request on 01/20/2020 at 10:00 AM EST by telephone and verified that I am speaking with the correct person using two identifiers.  Pt MRN:  [id number] Pt DOB:  Aug 25, 1940   History of Present Illness:  The patient had a telephone visit on 01/20/2020. 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 His wife is present during the phone visit to provide additional information. Since his last visit, they report he has been doing pretty good. He still cannot remember, but overall has been stable on Memantine 10mg  BID. His wife manages his pillbox and finances. He continues to drive locally. He had a driving evaluation in May 2021 where he was felt to remain capable of safely operating a motor vehicle. He had an MRI brain in June 2021 showing diffuse atrophy, no acute changes. 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 Mood is better with addition of Lexapro 10mg  daily, he does not get as upset so easily. No paranoia or hallucinations. Sometimes when he is sleeping he says something that is not right, but this is not frequent.  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 No falls. The dizziness appears better, he feels dizzy sometimes but does not take the meclizine daily. 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 No headaches, vision changes, focal numbness/tingling/weakness.    History on Initial Assessment 04/25/2018: This is a very pleasant 79 year old right-handed man with a history of hypertension, sleep apnea on CPAP, presenting for evaluation of worsening memory. He states his memory is not as good as it had been. His wife started noticing memory changes around 6 months ago, noting that he is very forgetful. He forgets where he puts things or does not remember the meal he ate prior. She feels his forgetfulness is just more than usual. He occasionally repeats himself. They both agree that he is good with managing his own medications, he rarely forgets them. 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 He denies any difficulties with driving and work. He got lost briefly a week ago in an unfamiliar road, he got "a little excited" but was able to find his way back within 5 minutes. His wife denies any other driving concerns. Wife manages finances. He is independent with dressing and bathing. His wife has noticed he is a little more stressed out and "gets really tight" when he cannot find something. No paranoia or hallucinations. Sleep is good with CPAP, no wandering behavior.  He has frontal headaches when using a machine with smoke coming out. He has occasional dizziness when standing. He has occasional back pain  from a pinched nerve. He has had a decreased sense of smell for some time. No diplopia, dysarthria/dysphagia, neck pain, focal numbness/tingling/weakness, bowel/bladder dysfunction, tremors. His father had memory issues. He occasionally drinks a little wine. No significant head injuries.     Observations/Objective:   Vitals:   01/20/20  1005  Weight: 172 lb (78 kg)  Height: 5\' 8"  (1.727 m)    Exam limited due to nature of phone visit. Patient awake, alert, able to answer questions without dysarthria. He is oriented x 3. 2/5 WORLD backwards, 0/3 delayed recall.    Assessment and Plan:  This is a pleasant 79 yo RH man with a history of hypertension, sleep apnea on CPAP, with memory loss. 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 MRI brain showed diffuse atrophy. Memory overall stable, continue Memantine 10mg  BID. He passed his driving evaluation, continue to monitor. Mood better on Lexapro 10mg  daily. Continue close supervision. Follow-up in 6-8 months, they know to call for any changes.    Follow Up Instructions:   -I discussed the assessment and treatment plan with the patient. The patient was provided an opportunity to ask questions and all were answered. The patient agreed with the plan and demonstrated an understanding of the instructions.   The patient was advised to call back or seek an in-person evaluation if the symptoms worsen or if the condition fails to improve as anticipated.    Total Time spent in visit with the patient was 9:51 minutes, of which 100% of the time was spent in counseling and/or coordinating care on the above.   Pt understands and agrees with the plan of care outlined.     Cameron Sprang, MD

## 2020-01-28 DIAGNOSIS — G4733 Obstructive sleep apnea (adult) (pediatric): Secondary | ICD-10-CM | POA: Diagnosis not present

## 2020-02-12 ENCOUNTER — Other Ambulatory Visit: Payer: Self-pay | Admitting: *Deleted

## 2020-02-12 NOTE — Patient Outreach (Signed)
[facility]) Care Management  02/12/2020  Eric Richmond April 17, 1940 [id number]  Unsuccessful outreach attempt made to patient. RN Health Coach left HIPAA compliant voicemail message along with her contact information.  Plan: RN Health Coach will call patient within the month of January.  [name] RN, BSN [place] [phone] [name]

## 2020-02-19 IMAGING — DX DG CHEST 1V PORT
1 series · 1 of 1 positions shown · non-contrast
Comparison: Radiograph 02/10/2019

CLINICAL DATA: Fever, weakness

EXAM:
PORTABLE CHEST 1 VIEW

[chest ap]
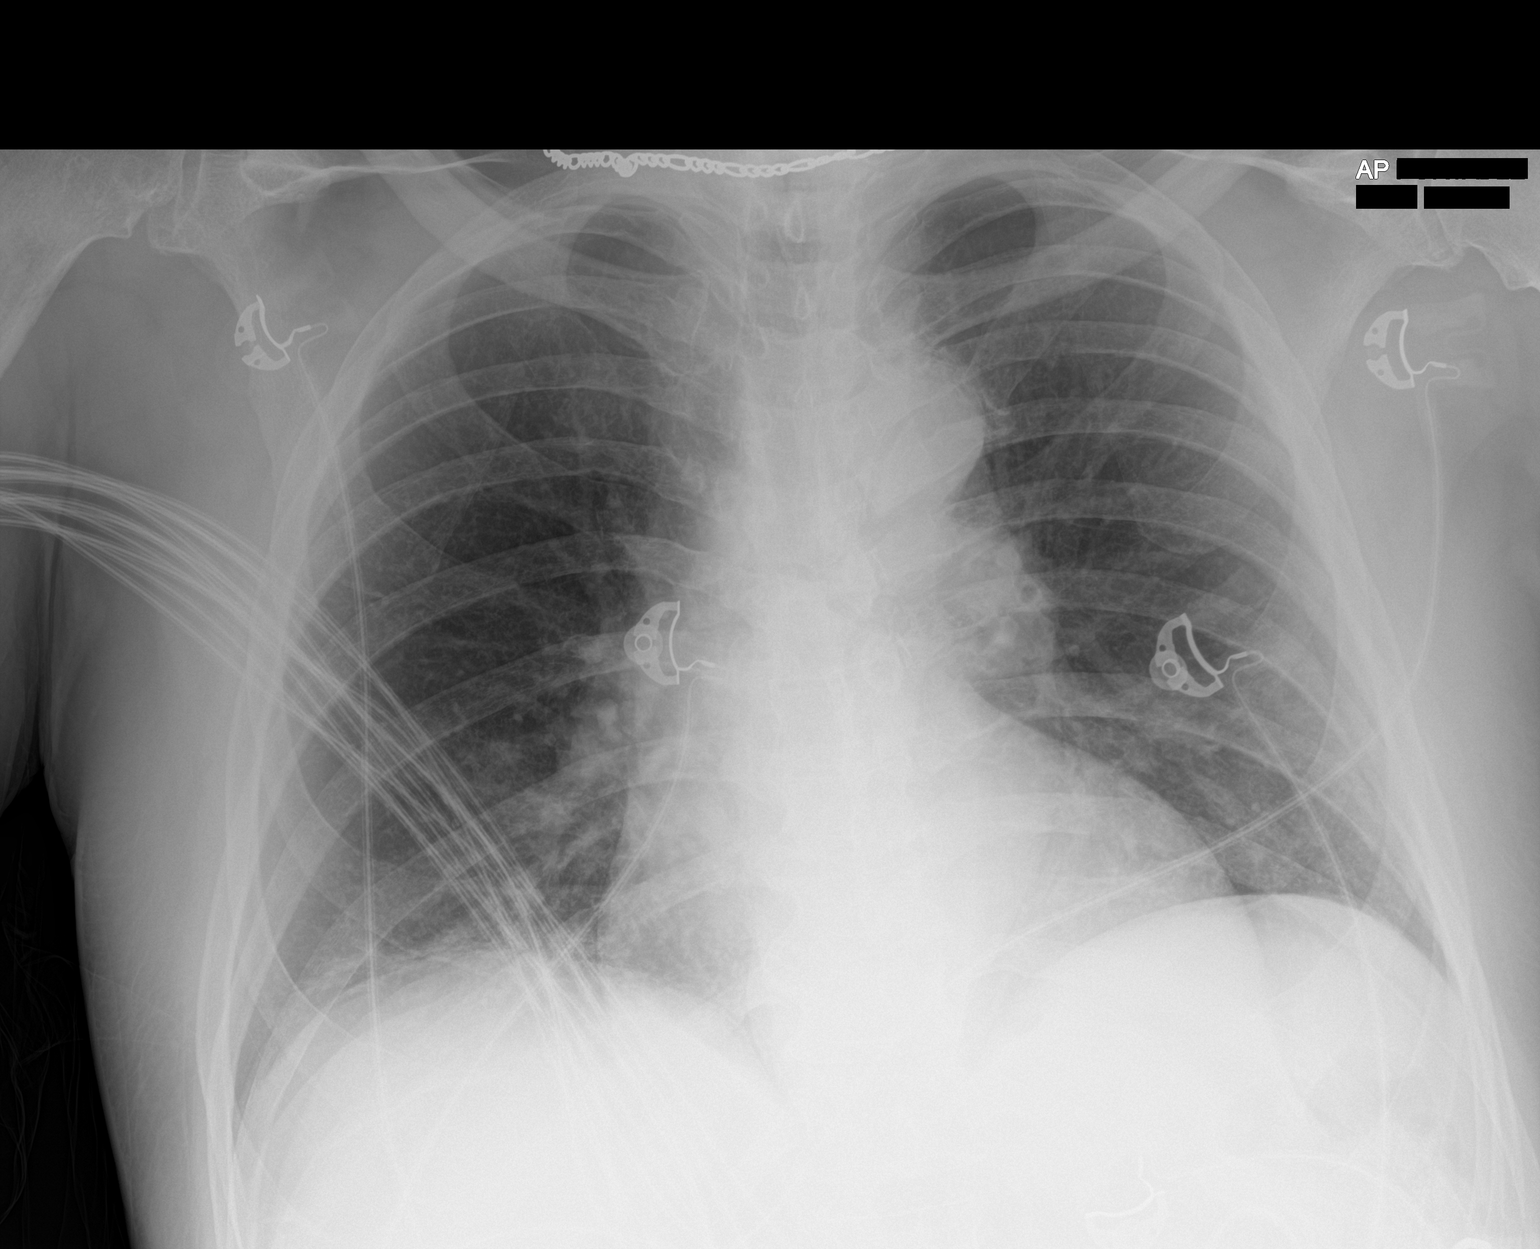

[1 of 1 positions shown; findings below may reference images not displayed]

FINDINGS: Low lung volumes with streaky bandlike opacities in lung bases
favoring atelectatic change. More patchy opacities present in the
periphery of the left lung and right infrahilar lung. No convincing
features of edema. No pneumothorax or effusion. Cardiomediastinal
contours are similar to prior accounting for low volumes and
portable technique. No acute osseous or soft tissue abnormality.
Degenerative changes are present in the imaged spine and shoulders.
Metallic necklace at the base of the neck.
IMPRESSION: 1. Low lung volumes with streaky bandlike opacities in the lung
bases favoring atelectatic change.
2. More patchy opacities in the lung bases and left lung periphery
may reflect additional atelectatic change or early infection.
3. No pneumothorax or effusion.

## 2020-02-22 ENCOUNTER — Other Ambulatory Visit: Payer: Self-pay | Admitting: Family Medicine

## 2020-03-01 IMAGING — DX DG CHEST 1V PORT
1 series · 1 of 1 positions shown · non-contrast
Comparison: 02/12/2019

CLINICAL DATA: Dizziness

EXAM:
PORTABLE CHEST 1 VIEW

[chest]
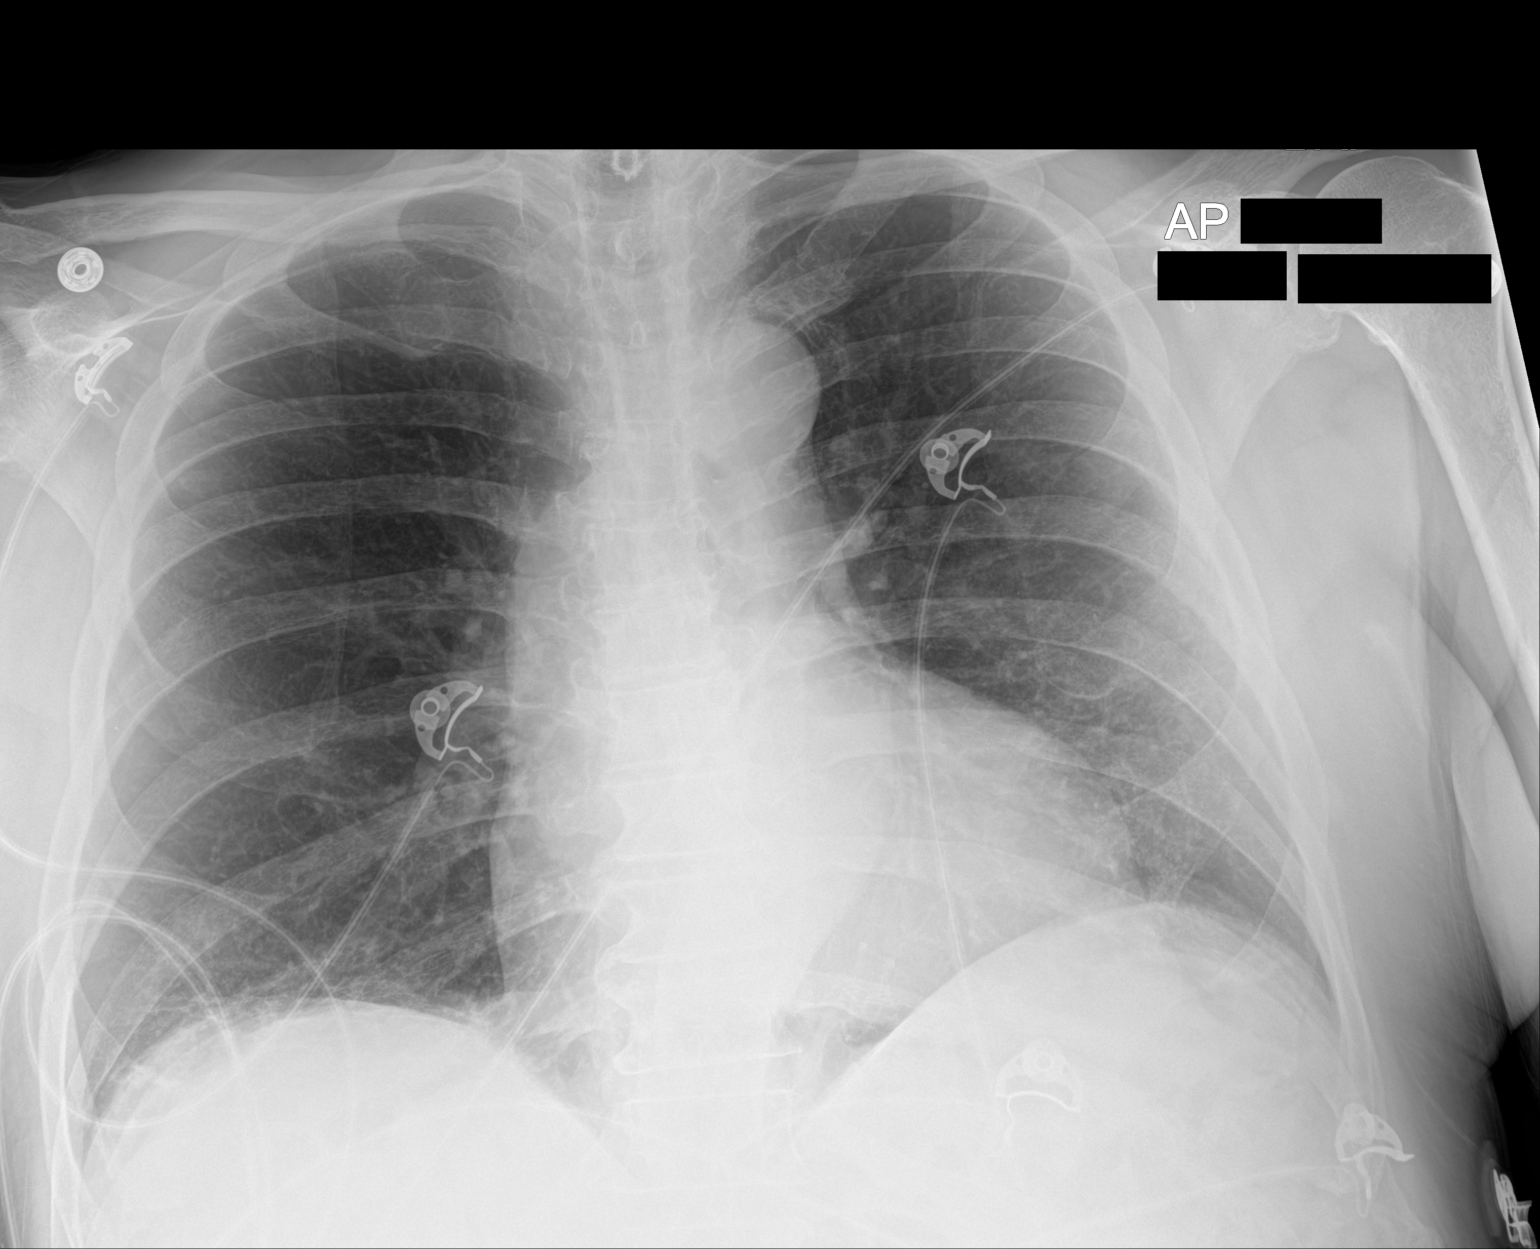

[1 of 1 positions shown; findings below may reference images not displayed]

FINDINGS: Heart size upper normal. Negative for heart failure. Mild right
lower lobe airspace disease unchanged, probably atelectasis. No
pleural effusion.
IMPRESSION: Mild right lower lobe airspace disease unchanged, probably
atelectasis. No new findings.

## 2020-03-07 IMAGING — DX DG CHEST 1V PORT
1 series · 1 of 1 positions shown · non-contrast
Comparison: February 23, 2019

CLINICAL DATA: Tachycardia.

EXAM:
PORTABLE CHEST 1 VIEW

[chest]
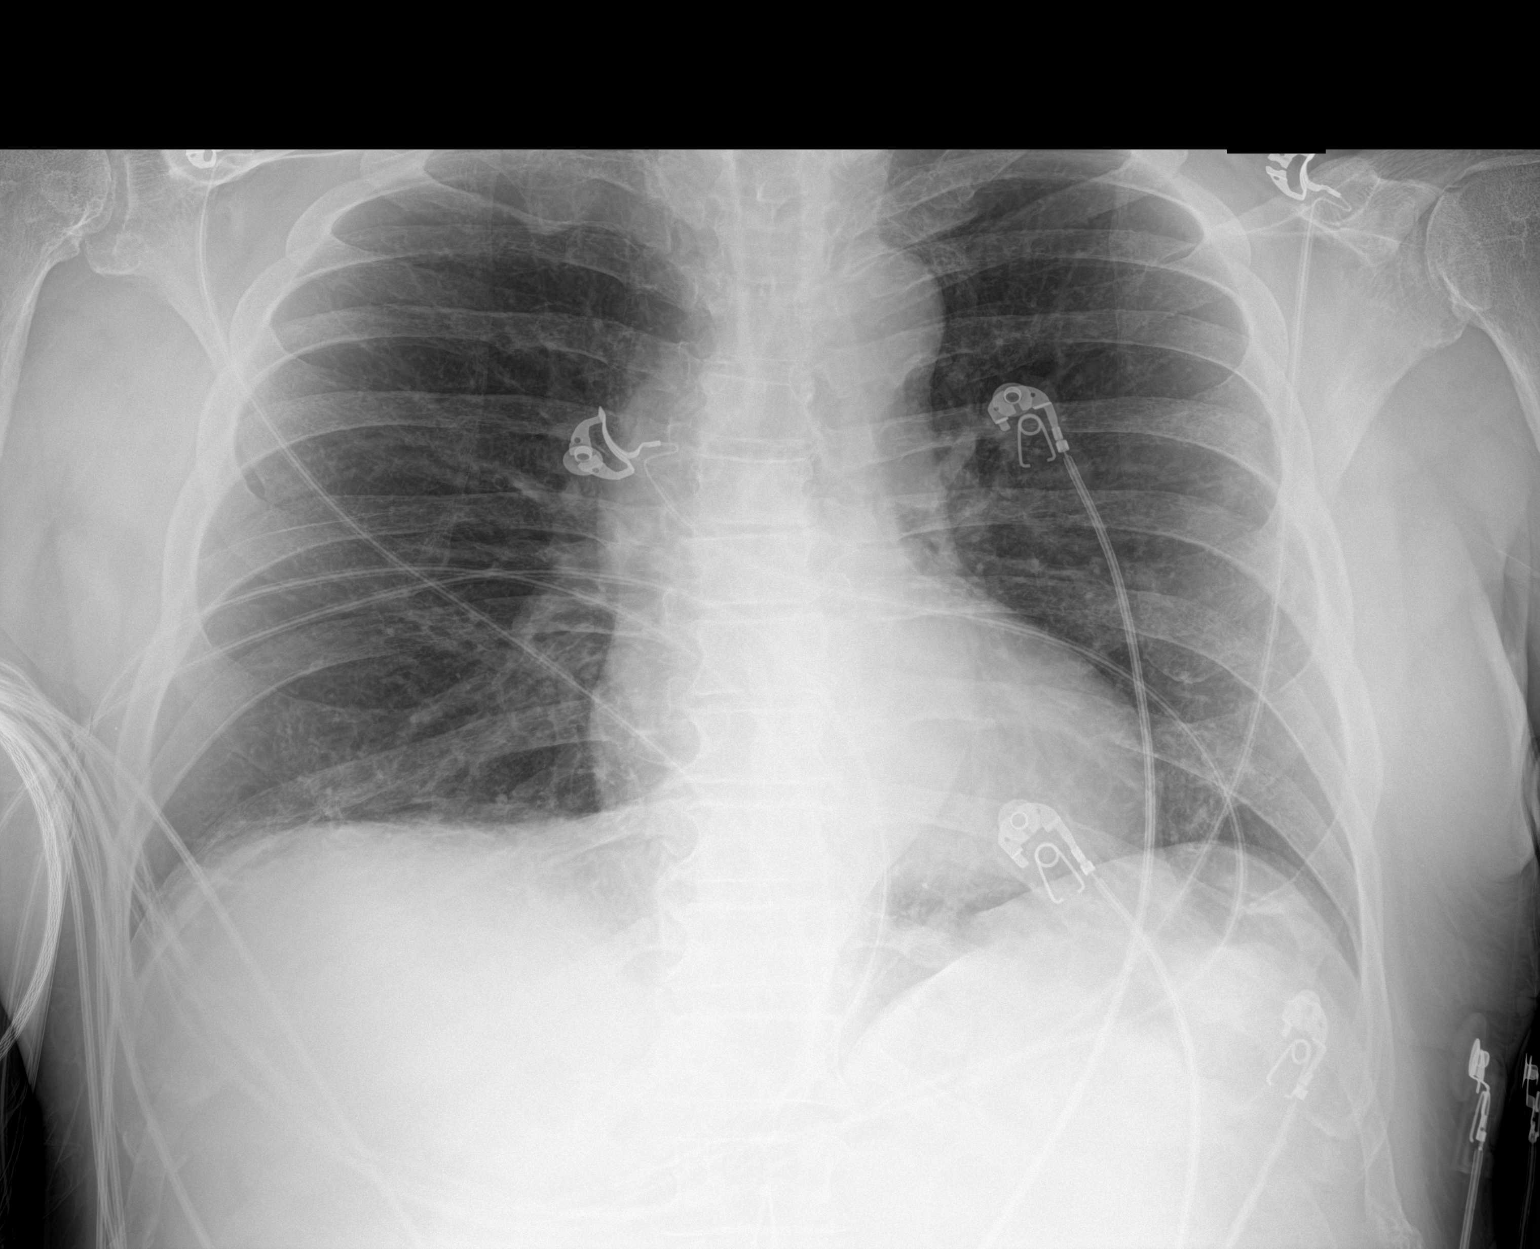

[1 of 1 positions shown; findings below may reference images not displayed]

FINDINGS: There is persistent right lower lobe airspace disease favored to
represent atelectasis. There is no pneumothorax. No large pleural
effusion. No focal infiltrate. The heart size is stable from prior
study. There is no acute osseous abnormality detected.
IMPRESSION: No active disease.

## 2020-03-10 DIAGNOSIS — Z96652 Presence of left artificial knee joint: Secondary | ICD-10-CM | POA: Diagnosis not present

## 2020-03-17 ENCOUNTER — Other Ambulatory Visit: Payer: Self-pay | Admitting: Physician Assistant

## 2020-03-24 ENCOUNTER — Other Ambulatory Visit: Payer: Self-pay | Admitting: *Deleted

## 2020-03-24 DIAGNOSIS — I10 Essential (primary) hypertension: Secondary | ICD-10-CM

## 2020-03-24 NOTE — Patient Outreach (Signed)
[facility]) Care Management  [place]  03/24/2020   Eric Richmond Jul 21, 1978 [id number]  Subjective: Successful telephone outreach call to patient's wife Eric Richmond due to patient's dementia. HIPAA identifiers obtained. Wife reports that the patient is doing well. He continues to drive for short distances to go play pool with one of his buddies as well as go to the [facility] to walk and exercise. Wife explains that he continues to eat pretty well and she tries to ensure that he limits his salt intake and drinks 3 bottles of water daily. Eric Richmond continues to manage the patient's medications and the house's finances. Wife states that the patient has not had any recent falls and that currently she is able to manage his care. Wife shared that the patient needs hearing aids and it will be difficult to be able to afford them. Nurse will send a community resource referral for assistance with obtaining hearing aids. Wife admits that she has not been taking the patient's B/P. Wife states the last B/P a couple of weeks ago was good. Nurse provided education and Eric Richmond will begin to take the patient's B/P daily. Eric Richmond did not have any further questions or concerns today and did confirm that she has this nurse's contact number to call her if needed.   Encounter Medications:  Outpatient Encounter Medications as of 03/24/2020  Medication Sig Note  . amLODipine (NORVASC) 5 MG tablet Take 1 tablet by mouth once daily   . ascorbic acid (VITAMIN C) 500 MG tablet Take 1 tablet (500 mg total) by mouth daily.   Marland Kitchen aspirin EC 81 MG tablet Take 81 mg by mouth daily.   Marland Kitchen escitalopram (LEXAPRO) 10 MG tablet Take 1 tablet (10 mg total) by mouth at bedtime.   . fluticasone (FLONASE) 50 MCG/ACT nasal spray Place 2 sprays into both nostrils daily.   Marland Kitchen losartan-hydrochlorothiazide (HYZAAR) 100-25 MG tablet TAKE 1 TABLET BY MOUTH ONCE DAILY IN THE MORNING   . meclizine (ANTIVERT) 25 MG tablet TAKE 1 TABLET BY  MOUTH EVERY 4 HOURS AS NEEDED FOR  DIZZINESS   . memantine (NAMENDA) 10 MG tablet Take 1 tablet (10 mg total) by mouth 2 (two) times daily.   . Multiple Vitamin (MULTIVITAMIN WITH MINERALS) TABS tablet Take 1 tablet by mouth daily.   . pantoprazole (PROTONIX) 40 MG tablet Take 1 tablet by mouth once daily   . albuterol (VENTOLIN HFA) 108 (90 Base) MCG/ACT inhaler Inhale 2 puffs into the lungs every 6 (six) hours as needed for wheezing or shortness of breath. (Patient not taking: No sig reported) 03/24/2020: Wife reports he is not using  . [DISCONTINUED] atenolol (TENORMIN) 50 MG tablet TAKE 1 TABLET EVERY DAY (NEED MD APPOINTMENT FOR REFILLS) (Patient taking differently: Take 50 mg by mouth daily. )    No facility-administered encounter medications on file as of 03/24/2020.    Functional Status:  In your present state of health, do you have any difficulty performing the following activities: 12/04/2019 06/11/2019  Hearing? Y Y  Comment Has difficulties hearing, has hearing aids but does not like wearing them states they do not work well has hearing aids  Vision? N N  Difficulty concentrating or making decisions? Y Y  Comment - diagnosis of dementia  Walking or climbing stairs? N N  Dressing or bathing? N N  Doing errands, shopping? Y Y  Comment - wife assist due to dementia  Preparing Food and eating ? N Y  Comment - unable to  prepare home-cooked meals, and do simple meals  Using the Toilet? N N  In the past six months, have you accidently leaked urine? N N  Do you have problems with loss of bowel control? N N  Managing your Medications? N Y  Comment - wife manages  Managing your Finances? N Y  Comment - wife Engineer, production or managing your Housekeeping? N N  Comment - patient is doing yard work  Some recent data might be hidden    Fall/Depression Screening: Fall Risk  03/24/2020 01/20/2020 12/04/2019  Falls in the past year? 0 0 0  Number falls in past yr: 0 0 0  Injury with  Fall? 0 0 0  Risk for fall due to : (No Data) - No Fall Risks  Risk for fall due to: Comment chronic dizziness - -  Follow up Falls prevention discussed;Education provided;Falls evaluation completed - Falls evaluation completed;Falls prevention discussed   PHQ 2/9 Scores 12/04/2019 07/09/2019 06/11/2019 02/23/2019 08/13/2017 08/09/2016 11/18/2014  PHQ - 2 Score 0 0 0 0 0 1 0  PHQ- 9 Score 0 - - - - - -    Assessment:  Goals Addressed            This Visit's Progress   . COMPLETED: Exercise 150 min/wk Moderate Activity       Will get back to the gym 3 days . This will help your pre -diabetic numbers   Educated regarding prediabetes and numbers;  A1c ranges from 5.8 to 6.5 or fasting Blood sugar > 115 -126; (126 is diabetic)   Risk: >45yo; family hx; overweight or obese; African American; Hispanic; Latino; American [place]; [place] American; [place]; history of diabetes when pregnant; or birth to a baby weighing over 9 lbs. Being less physically active than 30 minutes; 3 times a week;   Prevention; Losing a modest 7 to 8 lbs; If over 200 lbs; 10 to 14 lbs;  Choose healthier foods; colorful veggies; fish or lean meats; drinks water Reduce portion size Start exercising; 30 minutes of fast walking x 30 minutes per day/ 60 min for weight loss    Resolve due to duplicate goals      . Patient's B/P will be maintained below 150/90 per wife's verbalization within the next 90 days       [place] (see longtitudinal plan of care for additional care plan information)  Objective:  . Last practice recorded BP readings:  BP Readings from Last 3 Encounters:  08/27/19 (!) 154/92  07/09/19 131/82  07/08/19 128/66 .   Marland Kitchen Most recent eGFR/CrCl: No results found for: EGFR  No components found for: CRCL  Current Barriers:  Marland Kitchen Knowledge deficit related to self care management of hypertension  Case Manager Clinical Goal(s):  Marland Kitchen Over the next 90 days, patient will verbalize  understanding of plan for hypertension management . Over the next 90 days, patient will demonstrate improved adherence to prescribed treatment plan for hypertension as evidenced by taking all medications as prescribed, monitoring and recording blood pressure as directed, adhering to low sodium/DASH diet . Wife Eric Richmond assists the patient due to the patient having dementia.  Interventions:  . Evaluation of current treatment plan related to hypertension self management and patient's adherence to plan as established by provider. . Reviewed medications with patient and discussed importance of compliance . Discussed plans with patient for ongoing care management follow up and provided patient with direct contact information for care management team . Encouraged wife and  patient to go to the [facility] twice weekly and walk. . Discussed maintaining a heart healthy and low sodium diet.  . Encouraged wife and patient to drink 3 bottles of water daily . Discussed wife continuation of encouraging patient to get out daily to have social interaction. . Discussed importance of monitoring the patient's B/P daily and recording the values. . Nurse will send calendar booklet for wife to record B/P values. . Nurse will send a community resource referral to assists the patient with obtaining hearing aids.   Patient Self Care Activities:  . Self administers medications as prescribed . Attends all scheduled provider appointments . Calls provider office for new concerns, questions, or BP outside discussed parameters . Monitors BP and records as discussed . Adheres to a low sodium diet/DASH diet . Increase physical activity as tolerated . Patient's wife assists and manages patient's hypertension due to patient having dementia.   Please see past updates related to this goal by clicking on the "Past Updates" button in the selected goal   Updated 03/24/20 Timeframe:  Long-Range Goal Priority:  High Start Date:  09/09/19                            Expected End Date: 08/25/20 Follow Up Date: 06/25/20                          . COMPLETED: Spend more time traveling.      . Wife will verbalize taking the patient's B/P and recording the values daily during the next follow up outreach       Timeframe:  Long-Range Goal Priority:  High Start Date: 03/24/20                           Expected End Date: 06/25/20                      Follow Up Date 06/25/20    - check blood pressure daily - write blood pressure results in a log or diary    Why is this important?    You won't feel high blood pressure, but it can still hurt your blood vessels.   High blood pressure can cause heart or kidney problems. It can also cause a stroke.   Making lifestyle changes like losing a little weight or eating less salt will help.   Checking your blood pressure at home and at different times of the day can help to control blood pressure.   If the doctor prescribes medicine remember to take it the way the doctor ordered.   Call the office if you cannot afford the medicine or if there are questions about it.     Notes: Nurse provided education about importance of taking B/P daily and recording the values. Wife states she will begin to monitor the patient's B/P daily and record the values. Nurse will send a calendar booklet for the wife to record the values.       Plan: RN Health Coach will send PCP a quarterly report, will send patient [employer] coupons and a calendar booklet, will send a community resource referral for hearing aids, and will call patient within the month of April. Follow-up:  Patient agrees to Care Plan and Follow-up.  [name] RN, BSN [place] [phone] [email or web address]

## 2020-03-24 NOTE — Patient Instructions (Signed)
Goals Addressed            This Visit's Progress   . COMPLETED: Exercise 150 min/wk Moderate Activity       Will get back to the gym 3 days . This will help your pre -diabetic numbers   Educated regarding prediabetes and numbers;  A1c ranges from 5.8 to 6.5 or fasting Blood sugar > 115 -126; (126 is diabetic)   Risk: >80yo; family hx; overweight or obese; African American; Hispanic; Latino; American [place]; [place] American; [place]; history of diabetes when pregnant; or birth to a baby weighing over 9 lbs. Being less physically active than 30 minutes; 3 times a week;   Prevention; Losing a modest 7 to 8 lbs; If over 200 lbs; 10 to 14 lbs;  Choose healthier foods; colorful veggies; fish or lean meats; drinks water Reduce portion size Start exercising; 30 minutes of fast walking x 30 minutes per day/ 60 min for weight loss    Resolve due to duplicate goals      . Patient's B/P will be maintained below 150/90 per wife's verbalization within the next 90 days       [place] (see longtitudinal plan of care for additional care plan information)  Objective:  . Last practice recorded BP readings:  BP Readings from Last 3 Encounters:  08/27/19 (!) 154/92  07/09/19 131/82  07/08/19 128/66 .   Marland Kitchen Most recent eGFR/CrCl: No results found for: EGFR  No components found for: CRCL  Current Barriers:  Marland Kitchen Knowledge deficit related to self care management of hypertension  Case Manager Clinical Goal(s):  Marland Kitchen Over the next 90 days, patient will verbalize understanding of plan for hypertension management . Over the next 90 days, patient will demonstrate improved adherence to prescribed treatment plan for hypertension as evidenced by taking all medications as prescribed, monitoring and recording blood pressure as directed, adhering to low sodium/DASH diet . Wife [name] assists the patient due to the patient having dementia.  Interventions:  . Evaluation of current treatment plan  related to hypertension self management and patient's adherence to plan as established by provider. . Reviewed medications with patient and discussed importance of compliance . Discussed plans with patient for ongoing care management follow up and provided patient with direct contact information for care management team . Encouraged wife and patient to go to the [facility] twice weekly and walk. . Discussed maintaining a heart healthy and low sodium diet.  . Encouraged wife and patient to drink 3 bottles of water daily . Discussed wife continuation of encouraging patient to get out daily to have social interaction. . Discussed importance of monitoring the patient's B/P daily and recording the values. . Nurse will send calendar booklet for wife to record B/P values. . Nurse will send a community resource referral to assists the patient with obtaining hearing aids.   Patient Self Care Activities:  . Self administers medications as prescribed . Attends all scheduled provider appointments . Calls provider office for new concerns, questions, or BP outside discussed parameters . Monitors BP and records as discussed . Adheres to a low sodium diet/DASH diet . Increase physical activity as tolerated . Patient's wife assists and manages patient's hypertension due to patient having dementia.   Please see past updates related to this goal by clicking on the "Past Updates" button in the selected goal   Updated 03/24/20 Timeframe:  Long-Range Goal Priority:  High Start Date:  09/09/19  Expected End Date: 08/25/20 Follow Up Date: 06/25/20                          . COMPLETED: Spend more time traveling.      . Wife will verbalize taking the patient's B/P and recording the values daily during the next follow up outreach       Timeframe:  Long-Range Goal Priority:  High Start Date: 03/24/20                           Expected End Date: 06/25/20                      Follow Up Date  06/25/20    - check blood pressure daily - write blood pressure results in a log or diary    Why is this important?    You won't feel high blood pressure, but it can still hurt your blood vessels.   High blood pressure can cause heart or kidney problems. It can also cause a stroke.   Making lifestyle changes like losing a little weight or eating less salt will help.   Checking your blood pressure at home and at different times of the day can help to control blood pressure.   If the doctor prescribes medicine remember to take it the way the doctor ordered.   Call the office if you cannot afford the medicine or if there are questions about it.     Notes: Nurse provided education about importance of taking B/P daily and recording the values. Wife states she will begin to monitor the patient's B/P daily and record the values. Nurse will send a calendar booklet for the wife to record the values.

## 2020-03-29 ENCOUNTER — Encounter: Payer: Self-pay | Admitting: Family Medicine

## 2020-03-29 ENCOUNTER — Other Ambulatory Visit: Payer: Self-pay

## 2020-03-29 ENCOUNTER — Telehealth: Payer: Self-pay | Admitting: Family Medicine

## 2020-03-29 ENCOUNTER — Ambulatory Visit (INDEPENDENT_AMBULATORY_CARE_PROVIDER_SITE_OTHER): Payer: Medicare Other | Admitting: Family Medicine

## 2020-03-29 VITALS — BP 140/78 | HR 61 | Temp 98.0°F | Resp 18 | Wt 186.6 lb

## 2020-03-29 DIAGNOSIS — I10 Essential (primary) hypertension: Secondary | ICD-10-CM

## 2020-03-29 NOTE — Telephone Encounter (Signed)
   Telephone encounter was:  Successful.  03/29/2020 Name: Eric Richmond MRN: [id number] DOB: [birth year]  Eric Richmond is a 80 y.o. year old male who is a primary care patient of [name], MD . The community resource team was consulted for assistance with Financial Difficulties related to dental and hearing.   Care guide performed the following interventions: Patient provided with information about care guide support team and interviewed to confirm resource needs Investigation of community resources performed sending the patient a letter because she does not do email with resources for dentures/dental care and hearing aids. .  Follow Up Plan:  Care guide will follow up with patient by phone over the next week  [place], Care Management Phone: [phone] Email: [email or web address]

## 2020-03-29 NOTE — Telephone Encounter (Signed)
   Telephone encounter was:  Unsuccessful.  03/29/2020 Name: CANDACE RAMUS MRN: [id number] DOB: 01/14/1941  Unsuccessful outbound call made today to assist with:  hearing aids and also to find out if he is a Vet.   Outreach Attempt:  1st Attempt  A HIPAA compliant voice message was left requesting a return call.  Instructed patient to call back at [phone].  [place], Care Management Phone: [phone] Email: [email or web address]

## 2020-03-29 NOTE — Progress Notes (Signed)
   Subjective:    Patient ID: Verne Carrow, male    DOB: 10-16-1940, 80 y.o.   MRN: [id number]  HPI Here with concerns about his BP. Over the past few weeks he has often had readings of 150s or 160s over 90s. He feels the same as usual. He takes Losartan HCT 100-25 in the mornings and Amlodipine 5 mg in the evenings.    Review of Systems  Constitutional: Negative.   Respiratory: Negative.   Cardiovascular: Negative.        Objective:   Physical Exam Constitutional:      Appearance: Normal appearance.  Cardiovascular:     Rate and Rhythm: Normal rate and regular rhythm.     Pulses: Normal pulses.     Heart sounds: Normal heart sounds.  Pulmonary:     Effort: Pulmonary effort is normal.     Breath sounds: Normal breath sounds.  Musculoskeletal:     Right lower leg: No edema.     Left lower leg: No edema.  Neurological:     Mental Status: He is alert.           Assessment & Plan:  HTN. We will increase the Amlodipine to 10 mg daily. He will report back about his BP in 2 weeks.  Alysia Penna, MD

## 2020-04-06 ENCOUNTER — Telehealth: Payer: Self-pay | Admitting: Family Medicine

## 2020-04-06 ENCOUNTER — Encounter: Payer: Self-pay | Admitting: Family Medicine

## 2020-04-06 NOTE — Telephone Encounter (Signed)
   Telephone encounter was:  Successful.  04/06/2020 Name: Eric Richmond MRN: [id number] DOB: 1940/05/09  Eric Richmond is a 80 y.o. year old male who is a primary care patient of [name], MD . The community resource team was consulted for assistance with Financial Difficulties related to dental and hearing resources.   Care guide performed the following interventions: Investigation of community resources performed CG mailed a letter with resources for dental and hearing resources. .  Follow Up Plan:  No further follow up planned at this time. The patient has been provided with needed resources.  [place], Care Management Phone: [phone] Email: [email or web address]

## 2020-04-13 DIAGNOSIS — H905 Unspecified sensorineural hearing loss: Secondary | ICD-10-CM | POA: Diagnosis not present

## 2020-04-27 DIAGNOSIS — G4733 Obstructive sleep apnea (adult) (pediatric): Secondary | ICD-10-CM | POA: Diagnosis not present

## 2020-05-13 ENCOUNTER — Other Ambulatory Visit: Payer: Self-pay | Admitting: Family Medicine

## 2020-06-13 ENCOUNTER — Emergency Department (HOSPITAL_BASED_OUTPATIENT_CLINIC_OR_DEPARTMENT_OTHER)
Admission: EM | Admit: 2020-06-13 | Discharge: 2020-06-13 | Disposition: A | Discharge status: Home / Self Care | Payer: Medicare Other | Attending: Emergency Medicine | Admitting: Emergency Medicine

## 2020-06-13 ENCOUNTER — Emergency Department (HOSPITAL_BASED_OUTPATIENT_CLINIC_OR_DEPARTMENT_OTHER): Payer: Medicare Other

## 2020-06-13 ENCOUNTER — Emergency Department (HOSPITAL_COMMUNITY): Payer: Medicare Other

## 2020-06-13 ENCOUNTER — Encounter (HOSPITAL_BASED_OUTPATIENT_CLINIC_OR_DEPARTMENT_OTHER): Payer: Self-pay | Admitting: *Deleted

## 2020-06-13 ENCOUNTER — Other Ambulatory Visit: Payer: Self-pay

## 2020-06-13 DIAGNOSIS — Z79899 Other long term (current) drug therapy: Secondary | ICD-10-CM | POA: Diagnosis not present

## 2020-06-13 DIAGNOSIS — Z8616 Personal history of COVID-19: Secondary | ICD-10-CM | POA: Insufficient documentation

## 2020-06-13 DIAGNOSIS — R42 Dizziness and giddiness: Secondary | ICD-10-CM | POA: Diagnosis not present

## 2020-06-13 DIAGNOSIS — I10 Essential (primary) hypertension: Secondary | ICD-10-CM | POA: Diagnosis not present

## 2020-06-13 DIAGNOSIS — Z7982 Long term (current) use of aspirin: Secondary | ICD-10-CM | POA: Insufficient documentation

## 2020-06-13 DIAGNOSIS — Z96652 Presence of left artificial knee joint: Secondary | ICD-10-CM | POA: Diagnosis not present

## 2020-06-13 DIAGNOSIS — Z87891 Personal history of nicotine dependence: Secondary | ICD-10-CM | POA: Insufficient documentation

## 2020-06-13 DIAGNOSIS — E119 Type 2 diabetes mellitus without complications: Secondary | ICD-10-CM | POA: Diagnosis not present

## 2020-06-13 LAB — URINALYSIS, ROUTINE W REFLEX MICROSCOPIC
Bilirubin Urine: NEGATIVE
Glucose, UA: NEGATIVE mg/dL
Ketones, ur: NEGATIVE mg/dL
Leukocytes,Ua: NEGATIVE
Nitrite: NEGATIVE
Protein, ur: NEGATIVE mg/dL
Specific Gravity, Urine: 1.01 (ref 1.005–1.030)
pH: 6 (ref 5.0–8.0)

## 2020-06-13 LAB — COMPREHENSIVE METABOLIC PANEL
ALT: 12 U/L (ref 0–44)
AST: 20 U/L (ref 15–41)
Albumin: 4.1 g/dL (ref 3.5–5.0)
Alkaline Phosphatase: 65 U/L (ref 38–126)
Anion gap: 6 (ref 5–15)
BUN: 22 mg/dL (ref 8–23)
CO2: 29 mmol/L (ref 22–32)
Calcium: 9.6 mg/dL (ref 8.9–10.3)
Chloride: 104 mmol/L (ref 98–111)
Creatinine, Ser: 1.11 mg/dL (ref 0.61–1.24)
GFR, Estimated: >60 mL/min (ref >60)
Glucose, Bld: 122 mg/dL — ABNORMAL HIGH (ref 70–99)
Potassium: 3.8 mmol/L (ref 3.5–5.1)
Sodium: 139 mmol/L (ref 135–145)
Total Bilirubin: 0.9 mg/dL (ref 0.3–1.2)
Total Protein: 6.8 g/dL (ref 6.5–8.1)

## 2020-06-13 LAB — CBC WITH DIFFERENTIAL/PLATELET
Abs Immature Granulocytes: 0.01 10*3/uL (ref 0.00–0.07)
Basophils Absolute: 0 10*3/uL (ref 0.0–0.1)
Basophils Relative: 1 %
Eosinophils Absolute: 0.1 10*3/uL (ref 0.0–0.5)
Eosinophils Relative: 2 %
HCT: 39.2 % (ref 39.0–52.0)
Hemoglobin: 13.3 g/dL (ref 13.0–17.0)
Immature Granulocytes: 0 %
Lymphocytes Relative: 25 %
Lymphs Abs: 1.4 10*3/uL (ref 0.7–4.0)
MCH: 28.9 pg (ref 26.0–34.0)
MCHC: 33.9 g/dL (ref 30.0–36.0)
MCV: 85.2 fL (ref 80.0–100.0)
Monocytes Absolute: 0.3 10*3/uL (ref 0.1–1.0)
Monocytes Relative: 6 %
Neutro Abs: 3.6 10*3/uL (ref 1.7–7.7)
Neutrophils Relative %: 66 %
Platelets: 175 10*3/uL (ref 150–400)
RBC: 4.6 MIL/uL (ref 4.22–5.81)
RDW: 13.2 % (ref 11.5–15.5)
WBC: 5.5 10*3/uL (ref 4.0–10.5)
nRBC: 0 % (ref 0.0–0.2)

## 2020-06-13 MED ORDER — SODIUM CHLORIDE 0.9 % IV BOLUS
500.0000 mL | Freq: Once | INTRAVENOUS | Status: AC
  Administered 2020-06-13: 500 mL via INTRAVENOUS

## 2020-06-13 MED ORDER — LORAZEPAM 2 MG/ML IJ SOLN
0.5000 mg | Freq: Once | INTRAMUSCULAR | Status: AC
  Administered 2020-06-13: 0.5 mg via INTRAVENOUS
  Filled 2020-06-13: qty 1

## 2020-06-13 MED ORDER — LORAZEPAM 2 MG/ML IJ SOLN: 1.0000 mg | Freq: Once | INTRAMUSCULAR | Status: DC

## 2020-06-13 NOTE — ED Notes (Signed)
Pt stated that he felt fine during orthostatic vitals and denied nausea or dizzness

## 2020-06-13 NOTE — ED Notes (Signed)
Pt ambulatory to bathroom without difficulty.  

## 2020-06-13 NOTE — ED Provider Notes (Signed)
[facility] Provider Note   CSN: [id number] Arrival date & time: 06/13/20  1214     History Chief Complaint  Patient presents with  . Dizziness    Eric Richmond is a 80 y.o. male.  Pt presents to the ED today with dizziness.  Dizziness started on Thursday, 4/14.  The pt did see his pcp who put him on antivert.  The pt said he's been taking it, but it is not helping.  [name] said he is unable to get out of bed due to the dizziness.  It is worse when he stands up or moves his head.        Past Medical History:  Diagnosis Date  . Allergy   . Anal fissure 20 years ago  . COVID-19 01/2019  . Diabetes mellitus without complication ([place]) 8/67/6720  . ED (erectile dysfunction)   . Essential hypertension 10/22/2006   Qualifier: Diagnosis of  By: Sarajane Jews MD, Ishmael Holter   . GERD (gastroesophageal reflux disease)   . Hard of hearing   . 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 OSA (obstructive sleep apnea)    per Dr. Gwenette Greet , cpap -2.0  . Osteoarthritis    Right knee and right thumb; s/p total knee replacement  . Pneumonia 2015    Patient Active Problem List   Diagnosis Date Noted  . Vertigo 06/08/2019  . Respiratory tract infection due to COVID-19 virus 02/13/2019  . Acute confusion 02/13/2019  . 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 Environmental and seasonal allergies 05/20/2018  . Postoperative stiffness of total knee replacement ([place]) 01/12/2014  . OA (osteoarthritis) of knee 10/26/2013  . Diabetes mellitus without complication ([place]) 94/70/9628  . OSA (obstructive sleep apnea) 07/21/2010  . Essential hypertension 10/22/2006  . GERD (gastroesophageal reflux disease) 10/22/2006    Past Surgical History:  Procedure Laterality Date  . ANAL FISSURE REPAIR    . colonoscopy  08/07/2016   per Dr. Hilarie Fredrickson, adenomatous polyps removed, repeat in 3 years   . KNEE ARTHROSCOPY     left knee  . KNEE CLOSED REDUCTION Left 01/13/2014    Procedure: CLOSED MANIPULATION LEFT KNEE;  Surgeon: Gearlean Alf, MD;  Location: [facility];  Service: Orthopedics;  Laterality: Left;  . SURGERY AGE 38 OR 7 ON LEFT KNEE FOR A "GLAND BEHIND THE KNEE"    . TOTAL KNEE ARTHROPLASTY Left 10/26/2013   Procedure: LEFT TOTAL KNEE ARTHROPLASTY;  Surgeon: Gearlean Alf, MD;  Location: [facility];  Service: Orthopedics;  Laterality: Left;       Family History  Problem Relation Age of Onset  . Breast cancer Sister   . Diabetes Sister   . Hypertension Sister   . Thyroid cancer Sister   . Diabetes Mother   . Hypertension Mother   . Diabetes Father   . Hypertension Father   . Diabetes Sister   . Hypertension Other   . Diabetes Brother   . Hypertension Brother   . Heart failure Brother   . Heart failure Brother   . Diabetes Sister   . Colon cancer Neg Hx   . Esophageal cancer Neg Hx   . Rectal cancer Neg Hx   . Stomach cancer Neg Hx     Social History   Tobacco Use  . Smoking status: Former Smoker    Packs/day: 0.30    Years: 20.00    Pack years: 6.00    Types: Cigarettes    Quit date: 02/26/1977  Years since quitting: 43.3  . Smokeless tobacco: Former Systems developer    Types: Secondary school teacher  . Vaping Use: Never used  Substance Use Topics  . Alcohol use: No    Alcohol/week: 0.0 standard drinks  . Drug use: No    Home Medications Prior to Admission medications   Medication Sig Start Date End Date Taking? Authorizing Provider  albuterol (VENTOLIN HFA) 108 (90 Base) MCG/ACT inhaler Inhale 2 puffs into the lungs every 6 (six) hours as needed for wheezing or shortness of breath. 02/11/18   [name], MD  amLODipine (NORVASC) 5 MG tablet Take 1 tablet by mouth once daily 12/24/19   [name], MD  ascorbic acid (VITAMIN C) 500 MG tablet Take 1 tablet (500 mg total) by mouth daily. 02/17/19   Caren Griffins, MD  aspirin EC 81 MG tablet Take 81 mg by mouth daily.    [provider]  escitalopram (LEXAPRO) 10 MG tablet Take  1 tablet (10 mg total) by mouth at bedtime. 07/09/19   Cameron Sprang, MD  fluticasone [facility]) 50 MCG/ACT nasal spray Place 2 sprays into both nostrils daily. 10/21/17   [name], MD  losartan-hydrochlorothiazide (HYZAAR) 100-25 MG tablet TAKE 1 TABLET BY MOUTH ONCE DAILY IN THE MORNING 05/13/20   [name], MD  meclizine (ANTIVERT) 25 MG tablet TAKE 1 TABLET BY MOUTH EVERY 4 HOURS AS NEEDED FOR  DIZZINESS 11/25/19   [name], MD  memantine (NAMENDA) 10 MG tablet Take 1 tablet (10 mg total) by mouth 2 (two) times daily. 07/09/19   Cameron Sprang, MD  Multiple Vitamin (MULTIVITAMIN WITH MINERALS) TABS tablet Take 1 tablet by mouth daily.    [provider]  pantoprazole (PROTONIX) 40 MG tablet Take 1 tablet by mouth once daily 03/17/20   Pyrtle, Lajuan Lines, MD  atenolol (TENORMIN) 50 MG tablet TAKE 1 TABLET EVERY DAY (NEED MD APPOINTMENT FOR REFILLS) Patient taking differently: Take 50 mg by mouth daily.  07/09/18 06/03/19  [name], MD    Allergies    Oxycodone and Tramadol  Review of Systems   Review of Systems  Neurological: Positive for dizziness.  All other systems reviewed and are negative.   Physical Exam Updated Vital Signs BP 137/75   Pulse (!) 51   Temp (!) 97.3 F (36.3 C) (Oral)   Resp 14   Ht 5\' 8"  (1.727 m)   Wt 81.6 kg   SpO2 99%   BMI 27.37 kg/m   Physical Exam Vitals and nursing note reviewed.  Constitutional:      Appearance: Normal appearance.  HENT:     Head: Normocephalic and atraumatic.     Right Ear: External ear normal.     Left Ear: External ear normal.     Nose: Nose normal.     Mouth/Throat:     Mouth: Mucous membranes are moist.     Pharynx: Oropharynx is clear.  Eyes:     Extraocular Movements: Extraocular movements intact.     Conjunctiva/sclera: Conjunctivae normal.     Pupils: Pupils are equal, round, and reactive to light.  Cardiovascular:     Rate and Rhythm: Normal rate and regular rhythm.     Pulses: Normal  pulses.     Heart sounds: Normal heart sounds.  Pulmonary:     Effort: Pulmonary effort is normal.     Breath sounds: Normal breath sounds.  Abdominal:     General: Abdomen is  flat. Bowel sounds are normal.     Palpations: Abdomen is soft.  Musculoskeletal:        General: Normal range of motion.     Cervical back: Normal range of motion and neck supple.  Skin:    General: Skin is warm.     Capillary Refill: Capillary refill takes less than 2 seconds.  Neurological:     General: No focal deficit present.     Mental Status: He is alert and oriented to person, place, and time.  Psychiatric:        Mood and Affect: Mood normal.        Behavior: Behavior normal.        Thought Content: Thought content normal.        Judgment: Judgment normal.     ED Results / Procedures / Treatments   Labs (all labs ordered are listed, but only abnormal results are displayed) Labs Reviewed  COMPREHENSIVE METABOLIC PANEL - Abnormal; Notable for the following components:      Result Value   Glucose, Bld 122 (*)    All other components within normal limits  URINALYSIS, ROUTINE W REFLEX MICROSCOPIC - Abnormal; Notable for the following components:   Hgb urine dipstick TRACE (*)    All other components within normal limits  CBC WITH DIFFERENTIAL/PLATELET    EKG EKG Interpretation  Date/Time:  Monday June 13 2020 12:26:56 EDT Ventricular Rate:  68 PR Interval:  182 QRS Duration: 103 QT Interval:  396 QTC Calculation: 422 R Axis:   23 Text Interpretation: Sinus rhythm No significant change since last tracing Confirmed by Isla Pence [phone]) on 06/13/2020 1:01:10 PM   Radiology CT Head Wo Contrast  Result Date: 06/13/2020 CLINICAL DATA:  Worsening dizziness. EXAM: CT HEAD WITHOUT CONTRAST TECHNIQUE: Contiguous axial images were obtained from the base of the skull through the vertex without intravenous contrast. COMPARISON:  MRI brain dated August 13, 2019. CT head dated May 02, 2019.  FINDINGS: Brain: No evidence of acute infarction, hemorrhage, hydrocephalus, extra-axial collection or mass lesion/mass effect. Stable atrophy. Mega cisterna magna again noted. Vascular: Atherosclerotic vascular calcification of the carotid siphons. No hyperdense vessel. Skull: Negative for fracture. 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 Sinuses/Orbits: No acute finding. Other: None. IMPRESSION: 1. No acute intracranial abnormality. Electronically Signed   By: Titus Dubin M.D.   On: 06/13/2020 13:17    Procedures Procedures   Medications Ordered in ED Medications  sodium chloride 0.9 % bolus 500 mL (500 mLs Intravenous New Bag/Given 06/13/20 1257)  LORazepam (ATIVAN) injection 0.5 mg (0.5 mg Intravenous Given 06/13/20 1305)    ED Course  I have reviewed the triage vital signs and the nursing notes.  Pertinent labs & imaging results that were available during my care of the patient were reviewed by me and considered in my medical decision making (see chart for details).    MDM Rules/Calculators/A&P                          Pt is still dizzy after treatment.  I had him stand up and he is off balance and almost fell.    Pt has several risk factors for CVA.  Pt will need a MRI.  She was d/w Dr. Dina Rich (ED) at [facility] who accepts pt for transfer.  MRI ordered.  Final Clinical Impression(s) / ED Diagnoses Final diagnoses:  Dizziness    Rx / DC Orders ED Discharge Orders  None       Isla Pence, MD 06/13/20 1526

## 2020-06-13 NOTE — ED Notes (Signed)
Patient transported to CT 

## 2020-06-13 NOTE — ED Triage Notes (Signed)
Pt has vertigo and has been dizzy for awhile but it is getting worst.  Also complaint of mid abdominal pain for since Thursday. Has a history of GERD. Denies n/v

## 2020-06-13 NOTE — ED Provider Notes (Signed)
[facility] DEPARTMENT Provider Note   CSN: [id number] Arrival date & time: 06/13/20  1214     History Chief Complaint  Patient presents with  . Dizziness    Eric Richmond is a 80 y.o. male.  HPI   80 year old male with past medical history of DM, HTN presents to the emergency department for evaluation of dizziness.  Patient reportedly develops dizziness 4 days ago.  He was seen as an outpatient with his primary doctor who prescribed him Antivert.  He has had no relief with this and the dizziness was severe today preventing him from being able to get out of bed.  Patient was originally evaluated at our [place] facility, CT of the head was unremarkable and he was referred here for MRI for concern of CVA with multiple risk factors.  Past Medical History:  Diagnosis Date  . Allergy   . Anal fissure 20 years ago  . COVID-19 01/2019  . Diabetes mellitus without complication ([place]) 4/48/1856  . ED (erectile dysfunction)   . Essential hypertension 10/22/2006   Qualifier: Diagnosis of  By: Sarajane Jews MD, Ishmael Holter   . GERD (gastroesophageal reflux disease)   . Hard of hearing   . 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 OSA (obstructive sleep apnea)    per Dr. Gwenette Greet , cpap -2.0  . Osteoarthritis    Right knee and right thumb; s/p total knee replacement  . Pneumonia 2015    Patient Active Problem List   Diagnosis Date Noted  . Vertigo 06/08/2019  . Respiratory tract infection due to COVID-19 virus 02/13/2019  . Acute confusion 02/13/2019  . 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 Environmental and seasonal allergies 05/20/2018  . Postoperative stiffness of total knee replacement ([place]) 01/12/2014  . OA (osteoarthritis) of knee 10/26/2013  . Diabetes mellitus without complication ([place]) 31/49/7026  . OSA (obstructive sleep apnea) 07/21/2010  . Essential hypertension 10/22/2006  . GERD (gastroesophageal reflux disease) 10/22/2006    Past Surgical  History:  Procedure Laterality Date  . ANAL FISSURE REPAIR    . colonoscopy  08/07/2016   per Dr. Hilarie Fredrickson, adenomatous polyps removed, repeat in 3 years   . KNEE ARTHROSCOPY     left knee  . KNEE CLOSED REDUCTION Left 01/13/2014   Procedure: CLOSED MANIPULATION LEFT KNEE;  Surgeon: Gearlean Alf, MD;  Location: [facility];  Service: Orthopedics;  Laterality: Left;  . SURGERY AGE 68 OR 7 ON LEFT KNEE FOR A "GLAND BEHIND THE KNEE"    . TOTAL KNEE ARTHROPLASTY Left 10/26/2013   Procedure: LEFT TOTAL KNEE ARTHROPLASTY;  Surgeon: Gearlean Alf, MD;  Location: [facility];  Service: Orthopedics;  Laterality: Left;       Family History  Problem Relation Age of Onset  . Breast cancer Sister   . Diabetes Sister   . Hypertension Sister   . Thyroid cancer Sister   . Diabetes Mother   . Hypertension Mother   . Diabetes Father   . Hypertension Father   . Diabetes Sister   . Hypertension Other   . Diabetes Brother   . Hypertension Brother   . Heart failure Brother   . Heart failure Brother   . Diabetes Sister   . Colon cancer Neg Hx   . Esophageal cancer Neg Hx   . Rectal cancer Neg Hx   . Stomach cancer Neg Hx     Social History   Tobacco Use  . Smoking status: Former Smoker  Packs/day: 0.30    Years: 20.00    Pack years: 6.00    Types: Cigarettes    Quit date: 02/26/1977    Years since quitting: 43.3  . Smokeless tobacco: Former Systems developer    Types: Secondary school teacher  . Vaping Use: Never used  Substance Use Topics  . Alcohol use: No    Alcohol/week: 0.0 standard drinks  . Drug use: No    Home Medications Prior to Admission medications   Medication Sig Start Date End Date Taking? Authorizing Provider  albuterol (VENTOLIN HFA) 108 (90 Base) MCG/ACT inhaler Inhale 2 puffs into the lungs every 6 (six) hours as needed for wheezing or shortness of breath. 02/11/18   [name], MD  amLODipine (NORVASC) 5 MG tablet Take 1 tablet by mouth once daily 12/24/19   [name], MD   ascorbic acid (VITAMIN C) 500 MG tablet Take 1 tablet (500 mg total) by mouth daily. 02/17/19   Caren Griffins, MD  aspirin EC 81 MG tablet Take 81 mg by mouth daily.    [provider]  escitalopram (LEXAPRO) 10 MG tablet Take 1 tablet (10 mg total) by mouth at bedtime. 07/09/19   Cameron Sprang, MD  fluticasone [facility]) 50 MCG/ACT nasal spray Place 2 sprays into both nostrils daily. 10/21/17   [name], MD  losartan-hydrochlorothiazide (HYZAAR) 100-25 MG tablet TAKE 1 TABLET BY MOUTH ONCE DAILY IN THE MORNING 05/13/20   [name], MD  meclizine (ANTIVERT) 25 MG tablet TAKE 1 TABLET BY MOUTH EVERY 4 HOURS AS NEEDED FOR  DIZZINESS 11/25/19   [name], MD  memantine (NAMENDA) 10 MG tablet Take 1 tablet (10 mg total) by mouth 2 (two) times daily. 07/09/19   Cameron Sprang, MD  Multiple Vitamin (MULTIVITAMIN WITH MINERALS) TABS tablet Take 1 tablet by mouth daily.    [provider]  pantoprazole (PROTONIX) 40 MG tablet Take 1 tablet by mouth once daily 03/17/20   Pyrtle, Lajuan Lines, MD  atenolol (TENORMIN) 50 MG tablet TAKE 1 TABLET EVERY DAY (NEED MD APPOINTMENT FOR REFILLS) Patient taking differently: Take 50 mg by mouth daily.  07/09/18 06/03/19  [name], MD    Allergies    Oxycodone and Tramadol  Review of Systems   Review of Systems  Constitutional: Negative for chills and fever.  HENT: Negative for congestion.   Eyes: Negative for visual disturbance.  Respiratory: Negative for shortness of breath.   Cardiovascular: Negative for chest pain.  Gastrointestinal: Negative for abdominal pain, diarrhea and vomiting.  Genitourinary: Negative for dysuria.  Skin: Negative for rash.  Neurological: Positive for dizziness. Negative for facial asymmetry, speech difficulty, weakness, numbness and headaches.    Physical Exam Updated Vital Signs BP (!) 145/83 (BP Location: Left Arm)   Pulse 74   Temp 97.9 F (36.6 C) (Oral)   Resp 17   Ht 5\' 8"  (1.727 m)    Wt 81.6 kg   SpO2 99%   BMI 27.37 kg/m   Physical Exam Vitals and nursing note reviewed.  Constitutional:      Appearance: Normal appearance.  HENT:     Head: Normocephalic.     Mouth/Throat:     Mouth: Mucous membranes are moist.  Cardiovascular:     Rate and Rhythm: Normal rate.  Pulmonary:     Effort: Pulmonary effort is normal. No respiratory distress.  Abdominal:     Palpations: Abdomen is soft.     Tenderness:  There is no abdominal tenderness.  Skin:    General: Skin is warm.  Neurological:     Mental Status: He is alert and oriented to person, place, and time. Mental status is at baseline.     Sensory: No sensory deficit.     Gait: Gait abnormal.  Psychiatric:        Mood and Affect: Mood normal.     ED Results / Procedures / Treatments   Labs (all labs ordered are listed, but only abnormal results are displayed) Labs Reviewed  COMPREHENSIVE METABOLIC PANEL - Abnormal; Notable for the following components:      Result Value   Glucose, Bld 122 (*)    All other components within normal limits  URINALYSIS, ROUTINE W REFLEX MICROSCOPIC - Abnormal; Notable for the following components:   Hgb urine dipstick TRACE (*)    All other components within normal limits  CBC WITH DIFFERENTIAL/PLATELET    EKG EKG Interpretation  Date/Time:  Monday June 13 2020 12:26:56 EDT Ventricular Rate:  68 PR Interval:  182 QRS Duration: 103 QT Interval:  396 QTC Calculation: 422 R Axis:   23 Text Interpretation: Sinus rhythm No significant change since last tracing Confirmed by Isla Pence [phone]) on 06/13/2020 1:01:10 PM   Radiology CT Head Wo Contrast  Result Date: 06/13/2020 CLINICAL DATA:  Worsening dizziness. EXAM: CT HEAD WITHOUT CONTRAST TECHNIQUE: Contiguous axial images were obtained from the base of the skull through the vertex without intravenous contrast. COMPARISON:  MRI brain dated August 13, 2019. CT head dated May 02, 2019. FINDINGS: Brain: No evidence of  acute infarction, hemorrhage, hydrocephalus, extra-axial collection or mass lesion/mass effect. Stable atrophy. Mega cisterna magna again noted. Vascular: Atherosclerotic vascular calcification of the carotid siphons. No hyperdense vessel. Skull: Negative for fracture. 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 Sinuses/Orbits: No acute finding. Other: None. IMPRESSION: 1. No acute intracranial abnormality. Electronically Signed   By: Titus Dubin M.D.   On: 06/13/2020 13:17    Procedures Procedures   Medications Ordered in ED Medications  LORazepam (ATIVAN) injection 1 mg (has no administration in time range)  sodium chloride 0.9 % bolus 500 mL (500 mLs Intravenous New Bag/Given 06/13/20 1257)  LORazepam (ATIVAN) injection 0.5 mg (0.5 mg Intravenous Given 06/13/20 1305)    ED Course  I have reviewed the triage vital signs and the nursing notes.  Pertinent labs & imaging results that were available during my care of the patient were reviewed by me and considered in my medical decision making (see chart for details).    MDM Rules/Calculators/A&P                          80 year old male presents the emergency department for evaluation of ongoing dizziness not relieved with Antivert, plan for MRI.  Vital signs are stable at this time, he has an unsteady gait but no other acute neurologic finding on exam.  MRI shows no acute finding/stroke.  On reevaluation patient symptoms are significantly improved.  Upon further discussion he has struggled with vertigo and severe symptoms for a while.  He is followed up with ENT and neurology.  He has had vertigo PT without significant relief.  I discussed with him following up with neurology and possibly progressing to benzodiazepine treatment if needed.  Patient has been up and ambulating without difficulty, continues to be neuro intact.  They already have neurology follow-up, I recommended that he get seen soon for reevaluation.  Cautioned  against certain activities like driving given his symptoms.  Patient will be discharged and treated as an outpatient.  Discharge plan and strict return to ED precautions discussed, patient verbalizes understanding and agreement.  Final Clinical Impression(s) / ED Diagnoses Final diagnoses:  Dizziness    Rx / DC Orders ED Discharge Orders    None       Lorelle Gibbs, DO 06/13/20 2329

## 2020-06-13 NOTE — Discharge Instructions (Addendum)
You have been seen and discharged from the emergency department.  Your CT and MRI of the brain were unremarkable.  It appears your symptoms are due to vertigo.  Follow-up with your neurologist.  The neck step in your treatment may be benzodiazepine medicine.  Take care when doing activity like driving, be aware of your symptoms and how this may affect your ability to safely drive.  Take home medications as prescribed. If you have any worsening symptoms or further concerns for your health please return to an emergency department for further evaluation.

## 2020-06-13 NOTE — ED Notes (Signed)
Patient arrived to ED at this time. Answering questions app. In NAD.

## 2020-06-14 ENCOUNTER — Other Ambulatory Visit: Payer: Self-pay | Admitting: Internal Medicine

## 2020-06-15 ENCOUNTER — Encounter: Payer: Self-pay | Admitting: Family Medicine

## 2020-06-15 ENCOUNTER — Other Ambulatory Visit: Payer: Self-pay

## 2020-06-15 ENCOUNTER — Ambulatory Visit (INDEPENDENT_AMBULATORY_CARE_PROVIDER_SITE_OTHER): Payer: Medicare Other | Admitting: Family Medicine

## 2020-06-15 VITALS — BP 124/86 | HR 60 | Temp 97.8°F | Wt 184.0 lb

## 2020-06-15 DIAGNOSIS — R42 Dizziness and giddiness: Secondary | ICD-10-CM | POA: Diagnosis not present

## 2020-06-15 MED ORDER — SCOPOLAMINE 1 MG/3DAYS TD PT72: 1.0000 | MEDICATED_PATCH | TRANSDERMAL | 11 refills | Status: DC

## 2020-06-15 MED ORDER — AMLODIPINE BESYLATE 10 MG PO TABS: 10.0000 mg | ORAL_TABLET | Freq: Every day | ORAL | 3 refills | Status: DC

## 2020-06-15 NOTE — Progress Notes (Signed)
   Subjective:    Patient ID: Eric Richmond, male    DOB: Oct 08, 1940, 80 y.o.   MRN: [id number]  HPI Here with his wife to follow up an ED visit on 06-13-20 for dizziness. We have been treating him for vertigo for several years, but lately the Meclizine no longer helps much. On 06-13-20 he was much worse than usual so he went to the ED. His exam was normal other than difficulty walking. His labs were unremarkable. A head CT was clear. A brain MRI showed no acute lesions or strokes, just some generalized volume loss. He was given some IV Lorazepam and sent home. Since then he has felt better, though he still has mild vertigo off and on. No headaches.    Review of Systems  Constitutional: Negative.   Respiratory: Negative.   Cardiovascular: Negative.   Neurological: Positive for dizziness. Negative for tremors, seizures, syncope, facial asymmetry, speech difficulty, weakness, light-headedness, numbness and headaches.       Objective:   Physical Exam Constitutional:      Appearance: Normal appearance. He is not ill-appearing.  Cardiovascular:     Rate and Rhythm: Normal rate and regular rhythm.     Pulses: Normal pulses.     Heart sounds: Normal heart sounds.  Pulmonary:     Effort: Pulmonary effort is normal.     Breath sounds: Normal breath sounds.  Neurological:     General: No focal deficit present.     Mental Status: He is alert. Mental status is at baseline.           Assessment & Plan:  Vertigo. He will try using transdermal Scopolamine patches every 3 days. If this is not successful, we may try Valium. He has tried vestibular rehab in the past with no improvement. He sees Dr. Delice Lesch (Neurology) in August.  Alysia Penna, MD

## 2020-06-20 ENCOUNTER — Telehealth: Payer: Self-pay | Admitting: Neurology

## 2020-06-20 ENCOUNTER — Other Ambulatory Visit: Payer: Self-pay | Admitting: *Deleted

## 2020-06-20 NOTE — Telephone Encounter (Signed)
This is a rare side effect of the patches. Tell his wife to take the patch off, and we will not use this any more. Hopefully it will clear out of his system in about 24 hours

## 2020-06-20 NOTE — Telephone Encounter (Signed)
The patients wife called wanting to let [name] know that the patient has been talking out of his head since he started using the patch that was prescribed to him last week and she wants to if the patches are making him act like that or should he be seen to se what is going on with him.  Please advise

## 2020-06-20 NOTE — Telephone Encounter (Signed)
Patient wife is calling back regarding patients behavior and is requesting a call back, please advise. CB is [phone]

## 2020-06-20 NOTE — Telephone Encounter (Signed)
Patient's wife called in stating the patient went to the ED last week for vertigo and was told to follow up with [place]. He saw his PCP, [name], on Wednesday and he prescribed a patch for behind his ear for his vertigo. The patient has been "talking out of his head" since using the patch and getting up all hours of the night roaming around. She is not sure what to do, they haven't slept in 2 nights.

## 2020-06-20 NOTE — Patient Outreach (Signed)
[facility]) Care Management  06/20/2020  Eric Richmond Apr 17, 1940 [id number]  Successful telephone outreach call to patient. HIPAA identifiers obtained. Nurse called patient's wife back in response to message she received to contact wife/patient. Wife reports that the patient begun to get confused several hours after she place a scopolamine patch on the patient. Wife explains that the confusion continues and he and she have not had much sleep as the result of his confusion for the last 2 nights. Wife explained that she is waiting for the patient's PCP to call her back at any moment to discuss the situation. Nurse asked the wife to take the patient's B/P to ensure it was not too high or too low and asked her to share that value with [name] when he called which was 133/87 and pulse 64. Nurse discussed with the wife that Scopolamine patches can cause confusion and delirium as a side effect. Nurse suggested taking the patch off and to discuss whether or not to replace the patch when [name] calls back. Nurse stated that she would hang up and call back in about an hour see if the wife/patient had any other questions or concerns after they spoke to PCP.  Updated 1832: Nurse called back and spoke to wife who explained that [name] had instructed them to leave the patch off and to not reapply. Wife stated that PCP stated it could take a while for the mediation to get out of the patient's system. Nurse ensured that the wife and patient felt secure tonight and the wife shared that family was currently present and would be assisting the patient and her. Nurse set an appointment to check back with the patient/wife on 4/27 to follow up and wife confirm that she had this nurse's contact number to call her if needed.    Plan: RN Health Coach will call patient/wife on 4/27.    [name] RN, BSN [place] [phone] [email or web address]

## 2020-06-20 NOTE — Telephone Encounter (Signed)
Spoke with pt wife informed her that Dr Delice Lesch is out of the office this week, but she needs to call Dr Sarajane Jews to let him know about the pt changes after starting the patch. Pt wife stated that she will call the PCP office this morning

## 2020-06-20 NOTE — Telephone Encounter (Signed)
Spoke with pt wife verbalized understanding of Dr Sarajane Jews advise to take off the patch and not use it anymore

## 2020-06-22 ENCOUNTER — Other Ambulatory Visit: Payer: Self-pay | Admitting: *Deleted

## 2020-06-22 NOTE — Patient Outreach (Signed)
[facility]) Care Management  [place]  06/22/2020   Eric Richmond Jun 07, 1940 [id number]  Subjective: Successful telephone outreach call to patient's wife Eric Richmond. HIPAA identifiers obtained. Wife reports that the patient is doing much better with his confusion since the removal of the Scopolamine patch. She adds that he is back to his baseline and she adds that he does continue to have bouts of dizziness. Adding that she was instructed to stop the meclizine. Nurse advised the wife to call Dr. [name] office to ask about restarting the meclizine for dizziness and Eric Richmond verbalized understanding. Wife states that she has not taken the patient's B/P today. She explained that she has been taking his blood pressure several times weekly, she did receive the calendar booklet, and she is recording the patient's B/P values. Wife stated that she is going to begin to take the patients B/P daily and will record the values. Wife reports that both the patient and she have caught back up on their sleep after the patient remained confused over this pass weekend. Eric Richmond states their home environment is safe and that they are well supported by family. Wife did not have any further questions or concerns today and did confirm that she has this nurse's contact number to call her if needed.   Encounter Medications:  Outpatient Encounter Medications as of 06/22/2020  Medication Sig Note  . albuterol (VENTOLIN HFA) 108 (90 Base) MCG/ACT inhaler Inhale 2 puffs into the lungs every 6 (six) hours as needed for wheezing or shortness of breath. 03/24/2020: Wife reports he is not using  . amLODipine (NORVASC) 10 MG tablet Take 1 tablet (10 mg total) by mouth daily.   Marland Kitchen ascorbic acid (VITAMIN C) 500 MG tablet Take 1 tablet (500 mg total) by mouth daily.   Marland Kitchen aspirin EC 81 MG tablet Take 81 mg by mouth daily.   Marland Kitchen escitalopram (LEXAPRO) 10 MG tablet Take 1 tablet (10 mg total) by mouth at bedtime.   .  fluticasone (FLONASE) 50 MCG/ACT nasal spray Place 2 sprays into both nostrils daily.   Marland Kitchen losartan-hydrochlorothiazide (HYZAAR) 100-25 MG tablet TAKE 1 TABLET BY MOUTH ONCE DAILY IN THE MORNING   . memantine (NAMENDA) 10 MG tablet Take 1 tablet (10 mg total) by mouth 2 (two) times daily.   . Multiple Vitamin (MULTIVITAMIN WITH MINERALS) TABS tablet Take 1 tablet by mouth daily.   . pantoprazole (PROTONIX) 40 MG tablet Take 1 tablet by mouth once daily   . meclizine (ANTIVERT) 25 MG tablet TAKE 1 TABLET BY MOUTH EVERY 4 HOURS AS NEEDED FOR  DIZZINESS (Patient not taking: Reported on 06/22/2020) 06/22/2020: Medication on hold currently.  Marland Kitchen scopolamine (TRANSDERM-SCOP, 1.5 MG,) 1 MG/3DAYS Place 1 patch (1.5 mg total) onto the skin every 3 (three) days. (Patient not taking: Reported on 06/22/2020) 06/22/2020: Caused confusion  . [DISCONTINUED] atenolol (TENORMIN) 50 MG tablet TAKE 1 TABLET EVERY DAY (NEED MD APPOINTMENT FOR REFILLS) (Patient taking differently: Take 50 mg by mouth daily. )    No facility-administered encounter medications on file as of 06/22/2020.    Functional Status:  In your present state of health, do you have any difficulty performing the following activities: 12/04/2019  Hearing? Y  Comment Has difficulties hearing, has hearing aids but does not like wearing them states they do not work well  Vision? N  Difficulty concentrating or making decisions? Y  Walking or climbing stairs? N  Dressing or bathing? N  Doing errands, shopping? Eric Richmond  Preparing Food and eating ? N  Using the Toilet? N  In the past six months, have you accidently leaked urine? N  Do you have problems with loss of bowel control? N  Managing your Medications? N  Managing your Finances? N  Housekeeping or managing your Housekeeping? N  Some recent data might be hidden    Fall/Depression Screening: Fall Risk  06/22/2020 03/24/2020 01/20/2020  Falls in the past year? 1 0 0  Number falls in past yr: 0 0 0  Injury  with Fall? 0 0 0  Risk for fall due to : History of fall(s);Impaired balance/gait;Impaired mobility (No Data) -  Risk for fall due to: Comment chronic dizziness chronic dizziness -  Follow up Falls prevention discussed;Education provided;Falls evaluation completed Falls prevention discussed;Education provided;Falls evaluation completed -   PHQ 2/9 Scores 12/04/2019 07/09/2019 06/11/2019 02/23/2019 08/13/2017 08/09/2016 11/18/2014  PHQ - 2 Score 0 0 0 0 0 1 0  PHQ- 9 Score 0 - - - - - -    Assessment:  Goals Addressed            This Visit's Progress   . (THN) Patient's B/P will be maintained below 150/90 per wife's verbalization within the next 90 days       [place] (see longtitudinal plan of care for additional care plan information)  Objective:  . Last practice recorded BP readings:  BP Readings from Last 3 Encounters:  08/27/19 (!) 154/92  07/09/19 131/82  07/08/19 128/66 .   Marland Kitchen Most recent eGFR/CrCl: No results found for: EGFR  No components found for: CRCL  Current Barriers:  Marland Kitchen Knowledge deficit related to self care management of hypertension  Case Manager Clinical Goal(s):  Marland Kitchen Over the next 90 days, patient will verbalize understanding of plan for hypertension management . Over the next 90 days, patient will demonstrate improved adherence to prescribed treatment plan for hypertension as evidenced by taking all medications as prescribed, monitoring and recording blood pressure as directed, adhering to low sodium/DASH diet . Wife Eric Richmond assists the patient due to the patient having dementia.  Interventions:  . Evaluation of current treatment plan related to hypertension self management and patient's adherence to plan as established by provider. . Reviewed medications with patient and discussed importance of compliance . Discussed plans with patient for ongoing care management follow up and provided patient with direct contact information for care management  team . Encouraged wife and patient to go to the [facility] twice weekly and walk. . Discussed maintaining a heart healthy and low sodium diet.  . Encouraged wife and patient to drink 3 bottles of water daily . Discussed importance of monitoring the patient's B/P daily and recording the values.   Patient Self Care Activities:  . Self administers medications as prescribed . Attends all scheduled provider appointments . Calls provider office for new concerns, questions, or BP outside discussed parameters . Monitors BP and records as discussed . Adheres to a low sodium diet/DASH diet . Increase physical activity as tolerated . Patient's wife assists and manages patient's hypertension due to patient having dementia.  . Wife states she will take the patient's B/P daily and record the values in the calendar booklet and take the calendar booklet to doctor appointments.  Please see past updates related to this goal by clicking on the "Past Updates" button in the selected goal   Updated 03/24/20 Updated 06/22/20   Timeframe:  Long-Range Goal Priority:  High Start Date:  09/09/19  Expected End Date: 09/24/20 Follow Up Date: 09/24/20                           . [facility]) Wife will verbalize taking the patient's B/P and recording the values daily during the next follow up outreach       Timeframe:  Long-Range Goal Priority:  High Start Date: 03/24/20                           Expected End Date: 03/27/21                      Follow Up Date 09/24/20    - check blood pressure daily - write blood pressure results in a log or diary  -Encouraged limiting the amount of salt intake -Discussed bringing the calendar booklet with the recorded B/P values to [name] appointments -Encouraged increasing physical activity as tolerated    Why is this important?    You won't feel high blood pressure, but it can still hurt your blood vessels.   High blood pressure can cause heart or kidney  problems. It can also cause a stroke.   Making lifestyle changes like losing a little weight or eating less salt will help.   Checking your blood pressure at home and at different times of the day can help to control blood pressure.   If the doctor prescribes medicine remember to take it the way the doctor ordered.   Call the office if you cannot afford the medicine or if there are questions about it.     Notes: Nurse provided education about importance of taking B/P daily and recording the values. Wife states she will begin to monitor the patient's B/P daily and record the values. Nurse will send a calendar booklet for the wife to record the values.   Updated 06/22/20: Wife reports that she did receive the calendar booklet and she is taking the patient's B/P several times weekly. Wife states that she will begin to take the patient's B/P daily and record the values. She states she is also using the calendar to record the patient's blood sugar values and verbalized understanding about taking the calendar booklet to the appointments with [name] for him to review the values.       Plan: RN Health Coach will send PCP a quarterly update and will call patient/wife within the month of June. Follow-up:  Patient agrees to Care Plan and Follow-up.   [name] RN, BSN [place] [phone] [email or web address]

## 2020-06-22 NOTE — Patient Instructions (Addendum)
Goals Addressed            This Visit's Progress   . ([facility]) Patient's B/P will be maintained below 150/90 per wife's verbalization within the next 90 days       [place] (see longtitudinal plan of care for additional care plan information)  Objective:  . Last practice recorded BP readings:  BP Readings from Last 3 Encounters:  08/27/19 (!) 154/92  07/09/19 131/82  07/08/19 128/66 .   Marland Kitchen Most recent eGFR/CrCl: No results found for: EGFR  No components found for: CRCL  Current Barriers:  Marland Kitchen Knowledge deficit related to self care management of hypertension  Case Manager Clinical Goal(s):  Marland Kitchen Over the next 90 days, patient will verbalize understanding of plan for hypertension management . Over the next 90 days, patient will demonstrate improved adherence to prescribed treatment plan for hypertension as evidenced by taking all medications as prescribed, monitoring and recording blood pressure as directed, adhering to low sodium/DASH diet . Wife [name] assists the patient due to the patient having dementia.  Interventions:  . Evaluation of current treatment plan related to hypertension self management and patient's adherence to plan as established by provider. . Reviewed medications with patient and discussed importance of compliance . Discussed plans with patient for ongoing care management follow up and provided patient with direct contact information for care management team . Encouraged wife and patient to go to the [facility] twice weekly and walk. . Discussed maintaining a heart healthy and low sodium diet.  . Encouraged wife and patient to drink 3 bottles of water daily . Discussed importance of monitoring the patient's B/P daily and recording the values.   Patient Self Care Activities:  . Self administers medications as prescribed . Attends all scheduled provider appointments . Calls provider office for new concerns, questions, or BP outside discussed parameters . Monitors BP  and records as discussed . Adheres to a low sodium diet/DASH diet . Increase physical activity as tolerated . Patient's wife assists and manages patient's hypertension due to patient having dementia.  . Wife states she will take the patient's B/P daily and record the values in the calendar booklet and take the calendar booklet to doctor appointments.  Please see past updates related to this goal by clicking on the "Past Updates" button in the selected goal   Updated 03/24/20 Updated 06/22/20   Timeframe:  Gevena Mart Goal Priority:  High Start Date:  09/09/19                           Expected End Date: 09/24/20 Follow Up Date: 09/24/20                           . [facility]) Wife will verbalize taking the patient's B/P and recording the values daily during the next follow up outreach       Timeframe:  Long-Range Goal Priority:  High Start Date: 03/24/20                           Expected End Date: 03/27/21                      Follow Up Date 09/24/20    - check blood pressure daily - write blood pressure results in a log or diary  -Encouraged limiting the amount of salt intake -Discussed bringing the  calendar booklet with the recorded B/P values to [name] appointments -Encouraged increasing physical activity as tolerated    Why is this important?    You won't feel high blood pressure, but it can still hurt your blood vessels.   High blood pressure can cause heart or kidney problems. It can also cause a stroke.   Making lifestyle changes like losing a little weight or eating less salt will help.   Checking your blood pressure at home and at different times of the day can help to control blood pressure.   If the doctor prescribes medicine remember to take it the way the doctor ordered.   Call the office if you cannot afford the medicine or if there are questions about it.     Notes: Nurse provided education about importance of taking B/P daily and recording the values. Wife states  she will begin to monitor the patient's B/P daily and record the values. Nurse will send a calendar booklet for the wife to record the values.   Updated 06/22/20: Wife reports that she did receive the calendar booklet and she is taking the patient's B/P several times weekly. Wife states that she will begin to take the patient's B/P daily and record the values. She states she is also using the calendar to record the patient's blood sugar values and verbalized understanding about taking the calendar booklet to the appointments with [name] for him to review the values.        Confusion Confusion is the inability to think with your usual speed or clarity. Confusion can be caused by many things. People who are confused often describe their thinking as cloudy or unclear. Confusion can also include feeling disoriented. This means you are unaware of where you are or who you are. You may also not know the date or time. When confused, you may have trouble remembering, paying attention, or making decisions. Some people also act aggressively when they are confused. In some cases, confusion may come on quickly. In other cases, it may develop slowly over time. Confusion may be caused by medical conditions such as:  Infections, such as a urinary tract infection (UTI).  Low levels of oxygen, which can develop from conditions such as long-term lung disorders.  Decrease in brain function due to dementia and other conditions that affect the brain, such as seizures, strokes, brain tumors, or head injuries.  Mental health conditions, like panic attacks, anxiety, depression, and hallucinations. Confusion may also be caused by physical factors such as:  Loss of fluid (dehydration) or an imbalance of salts and minerals in the body (electrolytes).  Lack of certain nutrients like niacin, thiamine, or other B vitamins.  Fever or hypothermia, which is a sudden drop in body temperature.  Low or high blood  sugar.  Low or high blood pressure. Other causes include:  Lack of sleep or changes in routine or surroundings, such as when traveling or staying in a hospital.  Using too much alcohol, drugs, or medicine.  Side effects of medicines, or taking medicines that affect other medicines (drug interactions). Follow these instructions at home: Pay attention to your symptoms. Tell your health care provider about any changes or if you develop new symptoms. Follow these instructions to control or treat symptoms. Ask a family member or friend for help if needed. Medicines  Take over-the-counter and prescription medicines only as told by your health care provider.  Ask your health care provider about changing or stopping any medicines that  may be causing your confusion.  Avoid pain medicines or sleep medicines until you have fully recovered.  Use a pillbox or an alarm to help you take the right medicines at the right time.   Lifestyle  Eat a balanced diet that includes fruits and vegetables.  Get enough sleep. For most adults, this is 7-9 hours each night.  Do not drink alcohol.  Do not become isolated. Spend time with other people and make plans for your days.  Do not drive until your health care provider says that it is safe to do so.  Do not use any products that contain nicotine or tobacco, such as cigarettes, e-cigarettes, and chewing tobacco. If you need help quitting, ask your health care provider.  Stop other activities that may increase your chances of getting hurt. These may include some work duties, sports activities, swimming, or bike riding. Ask your health care provider what activities are safe for you.   Tips for caregivers  Find out if the person is confused. Ask the person to state his or her name, age, and the date. If the person is unsure or answers incorrectly, he or she may be confused and need assistance.  Always introduce yourself, no matter how well the person knows  you. Remind the person of his or her location.  Place a calendar and clock near the person who is confused. Keep a regular schedule. Make sure the person has plenty of light during the day and sleep at night.  Talk about current events and plans for the day.  Keep the environment calm, quiet, and peaceful.  Help the person do the things that he or she is unable to do. These include: ? Taking medicines. ? Keeping medical appointments. ? Helping with household duties, including meal preparation. ? Running errands.  Get help if you need it. There are several support groups for caregivers. If the person you are helping needs more support, consider day care, extended-care programs, or a skilled nursing facility. The person's health care provider may be able to help evaluate these options. General instructions  Monitor yourself for any conditions you may have. These can include: ? Checking your blood glucose levels if you have diabetes. ? Maintaining a healthy weight. ? Monitoring your blood pressure if you have hypertension. ? Monitoring your body temperature if you have a fever.  Keep all follow-up visits. This is important. Contact a health care provider if:  You have new symptoms or your symptoms get worse. Get help right away if you:  Feel that you are not able to care for yourself.  Develop severe headaches, repeated vomiting, seizures, blackouts, or slurred speech.  Have increasing confusion, weakness, numbness, restlessness, or personality changes.  Develop a loss of balance, have marked dizziness, feel uncoordinated, or fall.  Develop severe anxiety, or you have delusions or hallucinations. These symptoms may represent a serious problem that is an emergency. Do not wait to see if the symptoms will go away. Get medical help right away. Call your local emergency services (911 in the [place] Do not drive yourself to the hospital. Summary  Confusion is the inability to think  with your usual speed or clarity. People who are confused often describe their thinking as cloudy or unclear.  Confusion can also include having trouble remembering, paying attention, or making decisions.  Confusion may come on quickly or develop slowly over time, depending on the cause. There are many different causes of confusion.  Ask for help from family  members or friends if you are unable to take care of yourself. This information is not intended to replace advice given to you by your health care provider. Make sure you discuss any questions you have with your health care provider. Document Revised: 06/09/2019 Document Reviewed: 06/09/2019 Elsevier Patient Education  2021 [employer].  Vertigo Vertigo is the feeling that you or the things around you are moving when they are not. This feeling can come and go at any time. Vertigo often goes away on its own. This condition can be dangerous if it happens when you are doing activities like driving or working with machines. Your doctor will do tests to find the cause of your vertigo. These tests will also help your doctor decide on the best treatment for you. Follow these instructions at home: Eating and drinking  Drink enough fluid to keep your pee (urine) pale yellow.  Do not drink alcohol.      Activity  Return to your normal activities as told by your doctor. Ask your doctor what activities are safe for you.  In the morning, first sit up on the side of the bed. When you feel okay, stand slowly while you hold onto something until you know that your balance is fine.  Move slowly. Avoid sudden body or head movements or certain positions, as told by your doctor.  Use a cane if you have trouble standing or walking.  Sit down right away if you feel dizzy.  Avoid doing any tasks or activities that can cause danger to you or others if you get dizzy.  Avoid bending down if you feel dizzy. Place items in your home so that they are  easy for you to reach without leaning over.  Do not drive or use heavy machinery if you feel dizzy. General instructions  Take over-the-counter and prescription medicines only as told by your doctor.  Keep all follow-up visits as told by your doctor. This is important. Contact a doctor if:  Your medicine does not help your vertigo.  You have a fever.  Your problems get worse or you have new symptoms.  Your family or friends see changes in your behavior.  The feeling of being sick to your stomach gets worse.  Your vomiting gets worse.  You lose feeling (have numbness) in part of your body.  You feel prickling and tingling in a part of your body. Get help right away if:  You have trouble moving or talking.  You are always dizzy.  You pass out (faint).  You get very bad headaches.  You feel weak in your hands, arms, or legs.  You have changes in your hearing.  You have changes in how you see (vision).  You get a stiff neck.  Bright light starts to bother you. Summary  Vertigo is the feeling that you or the things around you are moving when they are not.  Your doctor will do tests to find the cause of your vertigo.  You may be told to avoid some tasks, positions, or movements.  Contact a doctor if your medicine is not helping, or if you have a fever, new symptoms, or a change in behavior.  Get help right away if you get very bad headaches, or if you have changes in how you speak, hear, or see. This information is not intended to replace advice given to you by your health care provider. Make sure you discuss any questions you have with your health care provider. Document  Revised: 01/06/2018 Document Reviewed: 01/06/2018 Elsevier Patient Education  2021 [employer].

## 2020-06-27 ENCOUNTER — Telehealth: Payer: Self-pay | Admitting: Family Medicine

## 2020-06-27 NOTE — Telephone Encounter (Signed)
This note is continued from the 04/25 note  The patient last seen [name] on 04/20  The patients wife called to let [name] know that he had to take the patches off because he is still having bad side effects and is still dizzy.  She was wanting to know if he can call in something different to the pharmacy.  [place], [address] Phone:  [phone]  Fax:  [phone]

## 2020-06-28 MED ORDER — DIAZEPAM 2 MG PO TABS: 2.0000 mg | ORAL_TABLET | Freq: Three times a day (TID) | ORAL | 2 refills | Status: DC | PRN

## 2020-06-28 NOTE — Telephone Encounter (Signed)
Spoke with pt wife verbalized understanding that Dr Sarajane Jews sent Valium to pt pharmacy

## 2020-06-28 NOTE — Telephone Encounter (Signed)
I sent in some low dose Valium to try for the dizziness

## 2020-07-17 ENCOUNTER — Other Ambulatory Visit: Payer: Self-pay | Admitting: Internal Medicine

## 2020-08-08 ENCOUNTER — Telehealth: Payer: Self-pay | Admitting: Neurology

## 2020-08-08 DIAGNOSIS — G4733 Obstructive sleep apnea (adult) (pediatric): Secondary | ICD-10-CM | POA: Diagnosis not present

## 2020-08-22 ENCOUNTER — Other Ambulatory Visit: Payer: Self-pay | Admitting: Family Medicine

## 2020-08-22 ENCOUNTER — Other Ambulatory Visit: Payer: Self-pay | Admitting: *Deleted

## 2020-08-22 NOTE — Patient Outreach (Signed)
[facility]) Care Management  08/22/2020  SHAQUON GROPP [birth year] [id number]  Unsuccessful outreach attempt made to patient. RN Health Coach left HIPAA compliant voicemail message along with her contact information.  Plan: RN Health Coach will call patient within the month of July.  [name] RN, BSN [place] [phone] [email or web address]

## 2020-08-23 ENCOUNTER — Ambulatory Visit: Payer: Self-pay | Admitting: *Deleted

## 2020-08-24 ENCOUNTER — Other Ambulatory Visit: Payer: Self-pay | Admitting: Family Medicine

## 2020-08-24 NOTE — Telephone Encounter (Signed)
Pt is calling in stating that they need a new prescription for Rx amlodipine (NORVASC) 10 MG pts spouse stated that he was told that to increase to bid instead of one a day so the pt is out of the medication.  Pharm:  [facility] on [address].  Pts spouse is calling to see if we rec'd a form for the pt to have a extraction done on 08/25/2020 @ 12:pm Dr. Virgel Gess (dentist)  [phone] ([phone] ([phone]).  I called the dentist office and spoke with Tianna and she is going to refax it so that it can be completed so that the pt does not have to r/s again.

## 2020-08-25 NOTE — Telephone Encounter (Signed)
Spoke with pt  wife advised that pt surgical clearance form was faxed to the dentist office.. Pt wife state that pt has been taking 20 mg amlodipine and he is out of medication. Advised that pt should be taking 10 mg, pt wife state that she will call back when she gets back home

## 2020-08-26 NOTE — Telephone Encounter (Signed)
Spoke with pt wife who confirmed pt is taking amlodipine 10 mg daily, state that she picked up Rx refill from pharmacy

## 2020-08-31 ENCOUNTER — Telehealth: Payer: Self-pay | Admitting: Family Medicine

## 2020-08-31 NOTE — Telephone Encounter (Signed)
The patient is at the dentist office for his extractions and the patient was not aware that he was supposed to stop taking his aspirin and they had questions about what was written on the order.  [name] [phone]

## 2020-08-31 NOTE — Telephone Encounter (Signed)
Pt dentist office called to confirm if pt should needs to hold off taking Aspirin for his dental procedure. Spoke with [employer] one of the  Art therapist with the dentist office advised that pt Clearance form for the procedure was faxed to the office, confirmed that their office received the form but did not contact pt advising to hold off the Aspirin per Dr Sarajane Jews recommendation. Pt has been rescheduled for the procedure.

## 2020-09-09 ENCOUNTER — Other Ambulatory Visit: Payer: Self-pay | Admitting: Neurology

## 2020-09-12 ENCOUNTER — Other Ambulatory Visit: Payer: Self-pay | Admitting: *Deleted

## 2020-09-12 NOTE — Patient Outreach (Signed)
[facility]) Care Management  09/12/2020  Dreden Rivere Gloss Mar 16, 1940 [id number]  Unsuccessful outreach attempt made to patient/wife. RN Health Coach left HIPAA compliant voicemail message along with her contact information.  Plan: RN Health Coach will call patient within the month of August.  [name] RN, BSN [place] [phone] [email or web address]

## 2020-10-05 ENCOUNTER — Encounter: Payer: Self-pay | Admitting: Neurology

## 2020-10-05 ENCOUNTER — Ambulatory Visit: Payer: Medicare Other | Admitting: Neurology

## 2020-10-05 ENCOUNTER — Other Ambulatory Visit: Payer: Self-pay

## 2020-10-05 VITALS — BP 127/77 | HR 60 | Resp 20 | Ht 68.0 in | Wt 187.0 lb

## 2020-10-05 DIAGNOSIS — G301 Alzheimer's disease with late onset: Secondary | ICD-10-CM

## 2020-10-05 DIAGNOSIS — F0281 Dementia in other diseases classified elsewhere with behavioral disturbance: Secondary | ICD-10-CM | POA: Diagnosis not present

## 2020-10-05 DIAGNOSIS — F03A0 Unspecified dementia, mild, without behavioral disturbance, psychotic disturbance, mood disturbance, and anxiety: Secondary | ICD-10-CM

## 2020-10-05 DIAGNOSIS — F02818 Dementia in other diseases classified elsewhere, unspecified severity, with other behavioral disturbance: Secondary | ICD-10-CM

## 2020-10-05 DIAGNOSIS — F0390 Unspecified dementia without behavioral disturbance: Secondary | ICD-10-CM | POA: Diagnosis not present

## 2020-10-05 MED ORDER — MEMANTINE HCL 10 MG PO TABS: 10.0000 mg | ORAL_TABLET | Freq: Two times a day (BID) | ORAL | 3 refills | Status: DC

## 2020-10-05 MED ORDER — ESCITALOPRAM OXALATE 20 MG PO TABS: 20.0000 mg | ORAL_TABLET | Freq: Every day | ORAL | 3 refills | Status: DC

## 2020-10-05 NOTE — Progress Notes (Signed)
NEUROLOGY FOLLOW UP OFFICE NOTE  DELVECCHIO WOLBERS [id number] 12-14-1940  HISTORY OF PRESENT ILLNESS: I had the pleasure of seeing Eric Richmond in follow-up in the neurology clinic on 10/05/2020.  The patient was last seen 9 months ago for memory loss. 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 He is again accompanied by his wife who helps supplement the history today.  Records and images were personally reviewed where available.  He is on Memantine '10mg'$  BID and Lexapro '10mg'$  daily without side effects. He feels his memory is "so-so." His wife reports decline, he repeats himself several times and has more difficulty following instructions. He has more difficulty operating the microwave oven. His wife manages finances and medications. He passed the driving evaluation in 2021 but does not drive as much due to dizziness. He denies getting lost driving. He is independent with dressing and bathing. He has chronic dizziness, his wife notes everyday almost he says he is dizzy, "more dizzy than not dizzy." He states when he is doing something such as playing pool, he does not notice it. He mostly notices it when he first stands up, then as he starts moving, the dizziness improves. No falls. He was in the ER on 06/13/20 for dizziness so severe he could not get out of bed. I personally reviewed MRI brain which did not show any acute changes. There was mild diffuse atrophy. He tried vestibular therapy last year with no improvement. Scopolamine patch caused confusion and hallucinations. No further hallucinations since stopping patch. He takes prn Valium for vertigo. Sleep is good. Sometimes he fights in his dreams and his wife gets kicked. Mood is "happy," his wife notes increased irritability. No tremors. He has a hearing aid but still is hard of hearing, audiologist said he needs his ears cleaned. He denies any headaches,  focal numbness/tingling/weakness.    History on Initial Assessment 04/25/2018: This is a very pleasant 80 year old right-handed man with a history of hypertension, sleep apnea on CPAP, presenting for evaluation of worsening memory. He states his memory is not as good as it had been. His wife started noticing memory changes around 6 months ago, noting that he is very forgetful. He forgets where he puts things or does not remember the meal he ate prior. She feels his forgetfulness is just more than usual. He occasionally repeats himself. They both agree that he is good with managing his own medications, he rarely forgets them. 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 He denies any difficulties with driving and work. He got lost briefly a week ago in an unfamiliar road, he got "a little excited" but was able to find his way back within 5 minutes. His wife denies any other driving concerns. Wife manages finances. He is independent with dressing and bathing. His wife has noticed he is a little more stressed out and "gets really tight" when he cannot find something. No paranoia or hallucinations. Sleep is good with CPAP, no wandering behavior.   He has frontal headaches when using a machine with smoke coming out. He has occasional dizziness when standing. He has occasional back pain from a pinched nerve. He has had a decreased sense of smell for some time. No diplopia, dysarthria/dysphagia, neck pain, focal numbness/tingling/weakness, bowel/bladder dysfunction, tremors. His father had memory issues. He occasionally drinks a little wine. No significant head injuries.   PAST  MEDICAL HISTORY: Past Medical History:  Diagnosis Date   Allergy    Anal fissure 20 years ago   COVID-19 01/2019   Diabetes mellitus without complication ([place]) 0000000   ED (erectile dysfunction)    Essential hypertension 10/22/2006   Qualifier: Diagnosis of  By: Sarajane Jews MD, Ishmael Holter     GERD (gastroesophageal reflux disease)    Hard of hearing    Mild neurocognitive disorder 01/06/2019   OSA (obstructive sleep apnea)    per Dr. Gwenette Greet , cpap -2.0   Osteoarthritis    Right knee and right thumb; s/p total knee replacement   Pneumonia 2015    MEDICATIONS: Current Outpatient Medications on File Prior to Visit  Medication Sig Dispense Refill   amLODipine (NORVASC) 10 MG tablet Take 1 tablet (10 mg total) by mouth daily. 90 tablet 3   amLODipine (NORVASC) 5 MG tablet Take 5 mg by mouth daily.     ascorbic acid (VITAMIN C) 500 MG tablet Take 1 tablet (500 mg total) by mouth daily. 6 tablet 0   aspirin EC 81 MG tablet Take 81 mg by mouth daily.     diazepam (VALIUM) 2 MG tablet Take 1 tablet (2 mg total) by mouth every 8 (eight) hours as needed (dizziness). 90 tablet 2   escitalopram (LEXAPRO) 10 MG tablet Take 1 tablet (10 mg total) by mouth at bedtime. 30 tablet 11   fluticasone (FLONASE) 50 MCG/ACT nasal spray Place 2 sprays into both nostrils daily. 48 g 3   losartan-hydrochlorothiazide (HYZAAR) 100-25 MG tablet TAKE 1 TABLET BY MOUTH ONCE DAILY IN THE MORNING 90 tablet 1   memantine (NAMENDA) 10 MG tablet Take 1 tablet by mouth twice daily 60 tablet 0   Multiple Vitamin (MULTIVITAMIN WITH MINERALS) TABS tablet Take 1 tablet by mouth daily.     pantoprazole (PROTONIX) 40 MG tablet Take 1 tablet by mouth once daily 30 tablet 2   albuterol (VENTOLIN HFA) 108 (90 Base) MCG/ACT inhaler Inhale 2 puffs into the lungs every 6 (six) hours as needed for wheezing or shortness of breath. (Patient not taking: Reported on 10/05/2020) 1 Inhaler 11   meclizine (ANTIVERT) 25 MG tablet TAKE 1 TABLET BY MOUTH EVERY 4 HOURS AS NEEDED FOR  DIZZINESS (Patient not taking: Reported on 10/05/2020) 60 tablet 5   scopolamine (TRANSDERM-SCOP, 1.5 MG,) 1 MG/3DAYS Place 1 patch (1.5 mg total) onto the skin every 3 (three) days. (Patient not taking: Reported on 10/05/2020) 10 patch 11   [DISCONTINUED]  atenolol (TENORMIN) 50 MG tablet TAKE 1 TABLET EVERY DAY (NEED MD APPOINTMENT FOR REFILLS) (Patient taking differently: Take 50 mg by mouth daily. ) 90 tablet 3   No current facility-administered medications on file prior to visit.    ALLERGIES: Allergies  Allergen Reactions   Oxycodone     Hallucinations    Tramadol     Hallucinations     FAMILY HISTORY: Family History  Problem Relation Age of Onset   Breast cancer Sister    Diabetes Sister    Hypertension Sister    Thyroid cancer Sister    Diabetes Mother    Hypertension Mother    Diabetes Father    Hypertension Father    Diabetes Sister    Hypertension Other    Diabetes Brother    Hypertension Brother    Heart failure Brother    Heart failure Brother    Diabetes Sister    Colon cancer Neg Hx    Esophageal cancer Neg Hx  Rectal cancer Neg Hx    Stomach cancer Neg Hx     SOCIAL HISTORY: Social History   Socioeconomic History   Marital status: Married    Spouse name: [name]   Number of children: 2   Years of education: 12   Highest education level: High school graduate  Occupational History   Occupation: retired  Tobacco Use   Smoking status: Former    Packs/day: 0.30    Years: 20.00    Pack years: 6.00    Types: Cigarettes    Quit date: 02/26/1977    Years since quitting: 43.6   Smokeless tobacco: Former    Types: Nurse, children's Use: Never used  Substance and Sexual Activity   Alcohol use: No    Alcohol/week: 0.0 standard drinks   Drug use: No   Sexual activity: Not Currently    Partners: Female  Other Topics Concern   Not on file  Social History Narrative   Epworth Sleepiness Scale = 5 (12/14/2014)      Right handed      Graduated HS      Lives with wife   Social Determinants of Health   Financial Resource Strain: Low Risk    Difficulty of Paying Living Expenses: Not hard at all  Food Insecurity: No Food Insecurity   Worried About Charity fundraiser in the Last Year:  Never true   [place] in the Last Year: Never true  Transportation Needs: No Transportation Needs   Lack of Transportation (Medical): No   Lack of Transportation (Non-Medical): No  Physical Activity: Sufficiently Active   Days of Exercise per Week: 3 days   Minutes of Exercise per Session: 60 min  Stress: No Stress Concern Present   Feeling of Stress : Not at all  Social Connections: Moderately Integrated   Frequency of Communication with Friends and Family: More than three times a week   Frequency of Social Gatherings with Friends and Family: Three times a week   Attends Religious Services: More than 4 times per year   Active Member of Clubs or Organizations: No   Attends Archivist Meetings: Never   Marital Status: Married  Human resources officer Violence: Not At Risk   Fear of Current or Ex-Partner: No   Emotionally Abused: No   Physically Abused: No   Sexually Abused: No     PHYSICAL EXAM: Vitals:   10/05/20 0813  BP: 127/77  Pulse: 60  Resp: 20  SpO2: 98%   General: No acute distress Head:  Normocephalic/atraumatic Skin/Extremities: No rash, no edema Neurological Exam: alert and oriented to person, place, month, day of week. No aphasia or dysarthria. Fund of knowledge is appropriate.  Recent and remote memory are impaired.  Attention and concentration are reduced. MMSE 18/30 MMSE - Mini Mental State Exam 10/05/2020 08/13/2017 08/09/2016  Orientation to time '3 4 4  '$ Orientation to time comments - - Disoriented to date.  Orientation to Place '5 5 5  '$ Registration '3 3 3  '$ Attention/ Calculation '2 4 4  '$ Recall 0 2 1  Language- name 2 objects '2 2 2  '$ Language- repeat '1 1 1  '$ Language- follow 3 step command '1 3 3  '$ Language- read & follow direction '1 1 1  '$ Write a sentence 0 1 1  Copy design 0 0 0  Total score '18 26 25   '$ Cranial nerves: Pupils equal, round. Extraocular movements intact with no nystagmus. Visual fields full.  No facial asymmetry.  Motor: Bulk and tone  normal, muscle strength 5/5 throughout with no pronator drift.   Finger to nose testing intact.  Gait slightly wide-based, no ataxia. No tremors.    IMPRESSION: This is a pleasant 80 yo RH man with a history of hypertension, sleep apnea on CPAP, with mild dementia, likely due to Alzheimer's disease. 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 There has been continued progression to mild dementia. MMSE today 17/30 (26/30 in 2019). MRI brain showed diffuse atrophy. Continue Memantine '10mg'$  BID, increase Lexapro to '20mg'$  daily for mood changes. Continue to monitor driving. He will try home vestibular exercises and will check BP for dizziness when standing. Continue close supervision. Follow-up in 6 months, they know to call for any changes.   Thank you for allowing me to participate in his care.  Please do not hesitate to call for any questions or concerns.    Ellouise Newer, M.D.   CC: [name]

## 2020-10-05 NOTE — Patient Instructions (Signed)
Increase Lexapro to '20mg'$  daily  2. Continue Memantine '10mg'$  twice a day  3. Proceed with ENT evaluation for hearing  4. Try the head movement exercises for dizziness, also check BP when you feel dizzy upon standing  5. Follow-up in 6 months, call for any changes   FALL PRECAUTIONS: Be cautious when walking. Scan the area for obstacles that may increase the risk of trips and falls. When getting up in the mornings, sit up at the edge of the bed for a few minutes before getting out of bed. Consider elevating the bed at the head end to avoid drop of blood pressure when getting up. Walk always in a well-lit room (use night lights in the walls). Avoid area rugs or power cords from appliances in the middle of the walkways. Use a walker or a cane if necessary and consider physical therapy for balance exercise. Get your eyesight checked regularly.   HOME SAFETY: Consider the safety of the kitchen when operating appliances like stoves, microwave oven, and blender. Consider having supervision and share cooking responsibilities until no longer able to participate in those. Accidents with firearms and other hazards in the house should be identified and addressed as well.  DRIVING: Regarding driving, in patients with progressive memory problems, driving will be impaired. We advise to have someone else do the driving if trouble finding directions or if minor accidents are reported. Independent driving assessment is available to determine safety of driving.  ABILITY TO BE LEFT ALONE: If patient is unable to contact 911 operator, consider using LifeLine, or when the need is there, arrange for someone to stay with patients. Smoking is a fire hazard, consider supervision or cessation. Risk of wandering should be assessed by caregiver and if detected at any point, supervision and safe proof recommendations should be instituted.   RECOMMENDATIONS FOR ALL PATIENTS WITH MEMORY PROBLEMS: 1. Continue to exercise  (Recommend 30 minutes of walking everyday, or 3 hours every week) 2. Increase social interactions - continue going to [place] and enjoy social gatherings with friends and family 3. Eat healthy, avoid fried foods and eat more fruits and vegetables 4. Maintain adequate blood pressure, blood sugar, and blood cholesterol level. Reducing the risk of stroke and cardiovascular disease also helps promoting better memory. 5. Avoid stressful situations. Live a simple life and avoid aggravations. Organize your time and prepare for the next day in anticipation. 6. Sleep well, avoid any interruptions of sleep and avoid any distractions in the bedroom that may interfere with adequate sleep quality 7. Avoid sugar, avoid sweets as there is a strong link between excessive sugar intake, diabetes, and cognitive impairment The Mediterranean diet has been shown to help patients reduce the risk of progressive memory disorders and reduces cardiovascular risk. This includes eating fish, eat fruits and green leafy vegetables, nuts like almonds and hazelnuts, walnuts, and also use olive oil. Avoid fast foods and fried foods as much as possible. Avoid sweets and sugar as sugar use has been linked to worsening of memory function.  There is always a concern of gradual progression of memory problems. If this is the case, then we may need to adjust level of care according to patient needs. Support, both to the patient and caregiver, should then be put into place.

## 2020-10-07 ENCOUNTER — Other Ambulatory Visit: Payer: Self-pay | Admitting: *Deleted

## 2020-10-07 NOTE — Patient Outreach (Addendum)
[facility]) Care Management  10/07/2020  Eric Richmond 1940-03-30 [id number]  Unsuccessful outreach attempt made to patient. RN Health Coach left HIPAA compliant voicemail message along with her contact information.  Plan: RN Health Coach will call patient within the month of September.  [name] RN, BSN [name] [phone] [name]

## 2020-10-11 ENCOUNTER — Ambulatory Visit: Payer: Medicare Other | Admitting: Neurology

## 2020-10-19 ENCOUNTER — Other Ambulatory Visit: Payer: Self-pay

## 2020-10-19 DIAGNOSIS — H0288B Meibomian gland dysfunction left eye, upper and lower eyelids: Secondary | ICD-10-CM | POA: Diagnosis not present

## 2020-10-19 DIAGNOSIS — H5213 Myopia, bilateral: Secondary | ICD-10-CM | POA: Diagnosis not present

## 2020-10-19 DIAGNOSIS — Z961 Presence of intraocular lens: Secondary | ICD-10-CM | POA: Diagnosis not present

## 2020-10-19 DIAGNOSIS — H353111 Nonexudative age-related macular degeneration, right eye, early dry stage: Secondary | ICD-10-CM | POA: Diagnosis not present

## 2020-10-19 DIAGNOSIS — H40023 Open angle with borderline findings, high risk, bilateral: Secondary | ICD-10-CM | POA: Diagnosis not present

## 2020-10-19 DIAGNOSIS — H0288A Meibomian gland dysfunction right eye, upper and lower eyelids: Secondary | ICD-10-CM | POA: Diagnosis not present

## 2020-10-19 MED ORDER — MEMANTINE HCL 10 MG PO TABS
10.0000 mg | ORAL_TABLET | Freq: Two times a day (BID) | ORAL | 2 refills | Status: DC
Start: 2020-10-19 — End: 2020-12-09

## 2020-10-20 ENCOUNTER — Telehealth: Payer: Self-pay | Admitting: Neurology

## 2020-10-20 ENCOUNTER — Other Ambulatory Visit: Payer: Self-pay

## 2020-10-20 MED ORDER — ESCITALOPRAM OXALATE 20 MG PO TABS: 20.0000 mg | ORAL_TABLET | Freq: Every day | ORAL | 3 refills | Status: DC

## 2020-10-20 MED ORDER — LOSARTAN POTASSIUM-HCTZ 100-25 MG PO TABS: 1.0000 | ORAL_TABLET | Freq: Every morning | ORAL | 1 refills | Status: DC

## 2020-10-20 MED ORDER — AMLODIPINE BESYLATE 10 MG PO TABS: 10.0000 mg | ORAL_TABLET | Freq: Every day | ORAL | 3 refills | Status: DC

## 2020-10-20 NOTE — Telephone Encounter (Signed)
[name] called in stating they were going to have to get an [place] from Dr. Delice Lesch for them to get the patient's medications through [facility] mail order pharmacy. She thought we might have to send a prescription in to them. They gave her a number of [id number]. She was not sure what kind of number that is.

## 2020-10-20 NOTE — Telephone Encounter (Signed)
Script for lexapro and namenda sent ot [facility] as requested by pt wife

## 2020-11-07 ENCOUNTER — Other Ambulatory Visit: Payer: Self-pay | Admitting: *Deleted

## 2020-11-07 NOTE — Patient Outreach (Signed)
[facility]) Care Management  [place]  11/07/2020   BARAA TUBBS 1940/04/07 [id number]  Subjective: Successful telephone outreach call to patient's wife [name]. HIPAA identifiers obtained. Wife states that the patient is doing well. She explains that his memory is getting worse but he is still able to drive very short distances to go to the [facility] and to go play pool with his friend. Wife is taking the patient's B/P  most days and recording the values. She states his B/P has been well controlled recently and that his chronic dizziness is better. Wife did not have any further questions or concerns today and did confirm that she has this nurse's contact number to call her if needed.   Encounter Medications:  Outpatient Encounter Medications as of 11/07/2020  Medication Sig Note   amLODipine (NORVASC) 10 MG tablet Take 1 tablet (10 mg total) by mouth daily.    ascorbic acid (VITAMIN C) 500 MG tablet Take 1 tablet (500 mg total) by mouth daily.    aspirin EC 81 MG tablet Take 81 mg by mouth daily.    diazepam (VALIUM) 2 MG tablet Take 1 tablet (2 mg total) by mouth every 8 (eight) hours as needed (dizziness).    escitalopram (LEXAPRO) 20 MG tablet Take 1 tablet (20 mg total) by mouth daily.    fluticasone (FLONASE) 50 MCG/ACT nasal spray Place 2 sprays into both nostrils daily.    losartan-hydrochlorothiazide (HYZAAR) 100-25 MG tablet Take 1 tablet by mouth every morning.    memantine (NAMENDA) 10 MG tablet Take 1 tablet (10 mg total) by mouth 2 (two) times daily.    Multiple Vitamin (MULTIVITAMIN WITH MINERALS) TABS tablet Take 1 tablet by mouth daily.    pantoprazole (PROTONIX) 40 MG tablet Take 1 tablet by mouth once daily    albuterol (VENTOLIN HFA) 108 (90 Base) MCG/ACT inhaler Inhale 2 puffs into the lungs every 6 (six) hours as needed for wheezing or shortness of breath. (Patient not taking: No sig reported) 03/24/2020: Wife reports he is not using   amLODipine  (NORVASC) 5 MG tablet Take 5 mg by mouth daily. (Patient not taking: Reported on 06/13/4079) 4/48/1856: duplicate   meclizine (ANTIVERT) 25 MG tablet TAKE 1 TABLET BY MOUTH EVERY 4 HOURS AS NEEDED FOR  DIZZINESS (Patient not taking: No sig reported) 11/07/2020: Not taking currently taking   [DISCONTINUED] atenolol (TENORMIN) 50 MG tablet TAKE 1 TABLET EVERY DAY (NEED MD APPOINTMENT FOR REFILLS) (Patient taking differently: Take 50 mg by mouth daily. )    No facility-administered encounter medications on file as of 11/07/2020.    Functional Status:  In your present state of health, do you have any difficulty performing the following activities: 12/04/2019  Hearing? Y  Comment Has difficulties hearing, has hearing aids but does not like wearing them states they do not work well  Vision? N  Difficulty concentrating or making decisions? Y  Walking or climbing stairs? N  Dressing or bathing? N  Doing errands, shopping? Y  Preparing Food and eating ? N  Using the Toilet? N  In the past six months, have you accidently leaked urine? N  Do you have problems with loss of bowel control? N  Managing your Medications? N  Managing your Finances? N  Housekeeping or managing your Housekeeping? N  Some recent data might be hidden    Fall/Depression Screening: Fall Risk  11/07/2020 10/05/2020 06/22/2020  Falls in the past year? 1 0 1  Number falls in  past yr: 1 0 0  Injury with Fall? 0 0 0  Risk for fall due to : History of fall(s);Impaired balance/gait;Impaired mobility - History of fall(s);Impaired balance/gait;Impaired mobility  Risk for fall due to: Comment chronic dizziness - chronic dizziness  Follow up Falls prevention discussed;Education provided;Falls evaluation completed - Falls prevention discussed;Education provided;Falls evaluation completed   PHQ 2/9 Scores 12/04/2019 07/09/2019 06/11/2019 02/23/2019 08/13/2017 08/09/2016 11/18/2014  PHQ - 2 Score 0 0 0 0 0 1 0  PHQ- 9 Score 0 - - - - - -     Assessment:   Care Plan There are no care plans that you recently modified to display for this patient.    Goals Addressed             This Visit's Progress    (THN) Patient's B/P will be maintained below 150/90 per wife's verbalization within the next 90 days       [place] (see longtitudinal plan of care for additional care plan information)  Objective:  Last practice recorded BP readings:  BP Readings from Last 3 Encounters:  08/27/19 (!) 154/92  07/09/19 131/82  07/08/19 128/66   Most recent eGFR/CrCl: No results found for: EGFR  No components found for: CRCL  Current Barriers:  Knowledge deficit related to self care management of hypertension  Case Manager Clinical Goal(s):  Over the next 90 days, patient will verbalize understanding of plan for hypertension management Over the next 90 days, patient will demonstrate improved adherence to prescribed treatment plan for hypertension as evidenced by taking all medications as prescribed, monitoring and recording blood pressure as directed, adhering to low sodium/DASH diet Wife [name] assists the patient due to the patient having dementia.  Interventions:  Evaluation of current treatment plan related to hypertension self management and patient's adherence to plan as established by provider. Reviewed medications with patient and discussed importance of compliance Discussed plans with patient for ongoing care management follow up and provided patient with direct contact information for care management team Encouraged wife and patient to go to the [facility] twice weekly and walk. Discussed maintaining a heart healthy and low sodium diet.  Encouraged wife and patient to drink 3 bottles of water daily Discussed importance of monitoring the patient's B/P daily and recording the values.   Patient Self Care Activities:  Self administers medications as prescribed Attends all scheduled provider appointments Calls provider  office for new concerns, questions, or BP outside discussed parameters Monitors BP and records as discussed Adheres to a low sodium diet/DASH diet Increase physical activity as tolerated Patient's wife assists and manages patient's hypertension due to patient having dementia.  Wife states she will take the patient's B/P daily and record the values in the calendar booklet and take the calendar booklet to doctor appointments.  Please see past updates related to this goal by clicking on the "Past Updates" button in the selected goal   Updated 03/24/20 Updated 06/22/20   Timeframe:  Gevena Mart Goal Priority:  High Start Date:  09/09/19                           Expected End Date: 09/24/20 Follow Up Date: 09/24/20   Resolved 9/39/03 due to duplicate goals                         [facility]) Wife will verbalize taking the patient's B/P and recording the values daily within the next  90 days   On track    Timeframe:  Long-Range Goal Priority:  High Start Date: 03/24/20                           Expected End Date: 03/27/21                      Follow Up Date 02/24/21    - check blood pressure daily - write blood pressure results in a log or diary  -Encouraged limiting the amount of salt intake and maintaining a healthy diet -Discussed bringing the calendar booklet with the recorded B/P values to [name] appointments -Encouraged increasing physical activity as tolerated, continuation of going to the [facility] and, playing pool with his friend   Why is this important?   You won't feel high blood pressure, but it can still hurt your blood vessels.  High blood pressure can cause heart or kidney problems. It can also cause a stroke.  Making lifestyle changes like losing a little weight or eating less salt will help.  Checking your blood pressure at home and at different times of the day can help to control blood pressure.  If the doctor prescribes medicine remember to take it the way the doctor ordered.   Call the office if you cannot afford the medicine or if there are questions about it.     Notes: Updated 11/07/20: Wife states that she takes the patient's blood pressure most days and records his values. She explains that the booklet she records that the values in are not with her for her to report the numbers but they have been really good recently. Wife states that the patient's dizziness has improved and reports the patient continues to drive short distances to the [facility] and to go play pool with his friend.  Updated 06/22/20: Wife reports that she did receive the calendar booklet and she is taking the patient's B/P several times weekly. Wife states that she will begin to take the patient's B/P daily and record the values. She states she is also using the calendar to record the patient's blood sugar values and verbalized understanding about taking the calendar booklet to the appointments with [name] for him to review the values.   03/24/20: Nurse provided education about importance of taking B/P daily and recording the values. Wife states she will begin to monitor the patient's B/P daily and record the values. Nurse will send a calendar booklet for the wife to record the values.         Plan: RN Health Coach will send PCP today's assessment note and will call wife/patient within the month of December. Follow-up: Patient agrees to Care Plan and Follow-up.  [name] RN, BSN [place] [phone] [email or web address]

## 2020-11-07 NOTE — Patient Instructions (Signed)
Goals Addressed             This Visit's Progress    (THN) Patient's B/P will be maintained below 150/90 per Eric's verbalization within the next 90 days       CARE PLAN ENTRY (see longtitudinal plan of care for additional care plan information)  Objective:  Last practice recorded BP readings:  BP Readings from Last 3 Encounters:  08/27/19 (!) 154/92  07/09/19 131/82  07/08/19 128/66   Most recent eGFR/CrCl: No results found for: EGFR  No components found for: CRCL  Current Barriers:  Knowledge deficit related to self care management of hypertension  Case Manager Clinical Goal(s):  Over the next 90 days, patient will verbalize understanding of plan for hypertension management Over the next 90 days, patient will demonstrate improved adherence to prescribed treatment plan for hypertension as evidenced by taking all medications as prescribed, monitoring and recording blood pressure as directed, adhering to low sodium/DASH diet Eric Richmond assists the patient due to the patient having dementia.  Interventions:  Evaluation of current treatment plan related to hypertension self management and patient's adherence to plan as established by provider. Reviewed medications with patient and discussed importance of compliance Discussed plans with patient for ongoing care management follow up and provided patient with direct contact information for care management team Encouraged Eric and patient to go to the [employer] twice weekly and walk. Discussed maintaining a heart healthy and low sodium diet.  Encouraged Eric and patient to drink 3 bottles of water daily Discussed importance of monitoring the patient's B/P daily and recording the values.   Patient Self Care Activities:  Self administers medications as prescribed Attends all scheduled provider appointments Calls provider office for new concerns, questions, or BP outside discussed parameters Monitors BP and records as discussed Adheres  to a low sodium diet/DASH diet Increase physical activity as tolerated Patient's Eric assists and manages patient's hypertension due to patient having dementia.  Eric states she will take the patient's B/P daily and record the values in the calendar booklet and take the calendar booklet to doctor appointments.  Please see past updates related to this goal by clicking on the "Past Updates" button in the selected goal   Updated 03/24/20 Updated 06/22/20   Timeframe:  Long-Range Goal Priority:  High Start Date:  09/09/19                           Expected End Date: 09/24/20 Follow Up Date: 09/24/20   Resolved 11/07/20 due to duplicate goals                         (THN) Eric will verbalize taking the patient's B/P and recording the values daily within the next 90 days   On track    Timeframe:  Long-Range Goal Priority:  High Start Date: 03/24/20                           Expected End Date: 03/27/21                      Follow Up Date 02/24/21    - check blood pressure daily - write blood pressure results in a log or diary  -Encouraged limiting the amount of salt intake and maintaining a healthy diet -Discussed bringing the calendar booklet with the recorded B/P values to Dr. Fry appointments -  Encouraged increasing physical activity as tolerated, continuation of going to the [facility] and, playing pool with his friend   Why is this important?   You won't feel high blood pressure, but it can still hurt your blood vessels.  High blood pressure can cause heart or kidney problems. It can also cause a stroke.  Making lifestyle changes like losing a little weight or eating less salt will help.  Checking your blood pressure at home and at different times of the day can help to control blood pressure.  If the doctor prescribes medicine remember to take it the way the doctor ordered.  Call the office if you cannot afford the medicine or if there are questions about it.     Notes: Updated 11/07/20:  Eric states that she takes the patient's blood pressure most days and records his values. She explains that the booklet she records that the values in are not with her for her to report the numbers but they have been really good recently. Eric states that the patient's dizziness has improved and reports the patient continues to drive short distances to the [facility] and to go play pool with his friend.  Updated 06/22/20: Eric reports that she did receive the calendar booklet and she is taking the patient's B/P several times weekly. Eric states that she will begin to take the patient's B/P daily and record the values. She states she is also using the calendar to record the patient's blood sugar values and verbalized understanding about taking the calendar booklet to the appointments with [name] for him to review the values.   03/24/20: Nurse provided education about importance of taking B/P daily and recording the values. Eric states she will begin to monitor the patient's B/P daily and record the values. Nurse will send a calendar booklet for the Eric to record the values.

## 2020-11-14 ENCOUNTER — Other Ambulatory Visit: Payer: Self-pay

## 2020-11-14 ENCOUNTER — Ambulatory Visit (INDEPENDENT_AMBULATORY_CARE_PROVIDER_SITE_OTHER): Payer: Medicare Other | Admitting: Otolaryngology

## 2020-11-14 DIAGNOSIS — H6123 Impacted cerumen, bilateral: Secondary | ICD-10-CM | POA: Diagnosis not present

## 2020-11-14 DIAGNOSIS — H903 Sensorineural hearing loss, bilateral: Secondary | ICD-10-CM | POA: Diagnosis not present

## 2020-11-14 NOTE — Progress Notes (Signed)
HPI: Eric Richmond is a 80 y.o. male who presents is referred by his PCP for evaluation of wax buildup in both ears and patient who wears bilateral hearing aids..  Past Medical History:  Diagnosis Date   Allergy    Anal fissure 20 years ago   COVID-19 01/2019   Diabetes mellitus without complication ([place]) 9/38/1017   ED (erectile dysfunction)    Essential hypertension 10/22/2006   Qualifier: Diagnosis of  By: Sarajane Jews MD, Ishmael Holter    GERD (gastroesophageal reflux disease)    Hard of hearing    Mild neurocognitive disorder 01/06/2019   OSA (obstructive sleep apnea)    per Dr. Gwenette Greet , cpap -2.0   Osteoarthritis    Right knee and right thumb; s/p total knee replacement   Pneumonia 2015   Past Surgical History:  Procedure Laterality Date   ANAL FISSURE REPAIR     colonoscopy  08/07/2016   per Dr. Hilarie Fredrickson, adenomatous polyps removed, repeat in 3 years    KNEE ARTHROSCOPY     left knee   KNEE CLOSED REDUCTION Left 01/13/2014   Procedure: CLOSED MANIPULATION LEFT KNEE;  Surgeon: Gearlean Alf, MD;  Location: [facility];  Service: Orthopedics;  Laterality: Left;   SURGERY AGE 65 OR 7 ON LEFT KNEE FOR A "GLAND BEHIND THE KNEE"     TOTAL KNEE ARTHROPLASTY Left 10/26/2013   Procedure: LEFT TOTAL KNEE ARTHROPLASTY;  Surgeon: Gearlean Alf, MD;  Location: [facility];  Service: Orthopedics;  Laterality: Left;   Social History   Socioeconomic History   Marital status: Married    Spouse name: [name]   Number of children: 2   Years of education: 12   Highest education level: High school graduate  Occupational History   Occupation: retired  Tobacco Use   Smoking status: Former    Packs/day: 0.30    Years: 20.00    Pack years: 6.00    Types: Cigarettes    Quit date: 02/26/1977    Years since quitting: 43.7   Smokeless tobacco: Former    Types: Nurse, children's Use: Never used  Substance and Sexual Activity   Alcohol use: No    Alcohol/week: 0.0 standard drinks   Drug use: No    Sexual activity: Not Currently    Partners: Female  Other Topics Concern   Not on file  Social History Narrative   Epworth Sleepiness Scale = 5 (12/14/2014)      Right handed      Graduated HS      Lives with wife   Social Determinants of Health   Financial Resource Strain: Low Risk    Difficulty of Paying Living Expenses: Not hard at all  Food Insecurity: No Food Insecurity   Worried About Charity fundraiser in the Last Year: Never true   [place] in the Last Year: Never true  Transportation Needs: No Transportation Needs   Lack of Transportation (Medical): No   Lack of Transportation (Non-Medical): No  Physical Activity: Sufficiently Active   Days of Exercise per Week: 3 days   Minutes of Exercise per Session: 60 min  Stress: No Stress Concern Present   Feeling of Stress : Not at all  Social Connections: Moderately Integrated   Frequency of Communication with Friends and Family: More than three times a week   Frequency of Social Gatherings with Friends and Family: Three times a week   Attends Religious Services: More than 4  times per year   Active Member of Clubs or Organizations: No   Attends Archivist Meetings: Never   Marital Status: Married   Family History  Problem Relation Age of Onset   Breast cancer Sister    Diabetes Sister    Hypertension Sister    Thyroid cancer Sister    Diabetes Mother    Hypertension Mother    Diabetes Father    Hypertension Father    Diabetes Sister    Hypertension Other    Diabetes Brother    Hypertension Brother    Heart failure Brother    Heart failure Brother    Diabetes Sister    Colon cancer Neg Hx    Esophageal cancer Neg Hx    Rectal cancer Neg Hx    Stomach cancer Neg Hx    Allergies  Allergen Reactions   Oxycodone     Hallucinations    Tramadol     Hallucinations    Prior to Admission medications   Medication Sig Start Date End Date Taking? Authorizing Provider  albuterol (VENTOLIN HFA)  108 (90 Base) MCG/ACT inhaler Inhale 2 puffs into the lungs every 6 (six) hours as needed for wheezing or shortness of breath. Patient not taking: No sig reported 02/11/18   [name], MD  amLODipine (NORVASC) 10 MG tablet Take 1 tablet (10 mg total) by mouth daily. 10/20/20   [name], MD  amLODipine (NORVASC) 5 MG tablet Take 5 mg by mouth daily. Patient not taking: Reported on 11/07/2020 08/08/20   [provider]  ascorbic acid (VITAMIN C) 500 MG tablet Take 1 tablet (500 mg total) by mouth daily. 02/17/19   Caren Griffins, MD  aspirin EC 81 MG tablet Take 81 mg by mouth daily.    [provider]  diazepam (VALIUM) 2 MG tablet Take 1 tablet (2 mg total) by mouth every 8 (eight) hours as needed (dizziness). 06/28/20   [name], MD  escitalopram (LEXAPRO) 20 MG tablet Take 1 tablet (20 mg total) by mouth daily. 10/20/20   Cameron Sprang, MD  fluticasone [facility]) 50 MCG/ACT nasal spray Place 2 sprays into both nostrils daily. 10/21/17   [name], MD  losartan-hydrochlorothiazide [facility]) 100-25 MG tablet Take 1 tablet by mouth every morning. 10/20/20   [name], MD  meclizine (ANTIVERT) 25 MG tablet TAKE 1 TABLET BY MOUTH EVERY 4 HOURS AS NEEDED FOR  DIZZINESS Patient not taking: No sig reported 11/25/19   [name], MD  memantine (NAMENDA) 10 MG tablet Take 1 tablet (10 mg total) by mouth 2 (two) times daily. 10/19/20   Cameron Sprang, MD  Multiple Vitamin (MULTIVITAMIN WITH MINERALS) TABS tablet Take 1 tablet by mouth daily.    [provider]  pantoprazole (PROTONIX) 40 MG tablet Take 1 tablet by mouth once daily 07/18/20   Pyrtle, Lajuan Lines, MD  atenolol (TENORMIN) 50 MG tablet TAKE 1 TABLET EVERY DAY (NEED MD APPOINTMENT FOR REFILLS) Patient taking differently: Take 50 mg by mouth daily.  07/09/18 06/03/19  [name], MD     Positive ROS: Otherwise negative  All other systems have been reviewed and were otherwise negative with the  exception of those mentioned in the HPI and as above.  Physical Exam: Constitutional: Alert, well-appearing, no acute distress Ears: External ears without lesions or tenderness.  Patient with a mod amount of wax buildup in both ear canals that was cleaned in the office  using curette forceps and suction.  TMs were clear bilaterally. Nasal: External nose without lesions. Septum with mild deformity.. Clear nasal passages Oral: Lips and gums without lesions. Tongue and palate mucosa without lesions. Posterior oropharynx clear. Neck: No palpable adenopathy or masses Respiratory: Breathing comfortably  Skin: No facial/neck lesions or rash noted.  Cerumen impaction removal  Date/Time: 11/14/2020 5:24 PM Performed by: Rozetta Nunnery, MD Authorized by: Rozetta Nunnery, MD   Consent:    Consent obtained:  Verbal   Consent given by:  Patient   Risks discussed:  Pain and bleeding Procedure details:    Location:  L ear and R ear   Procedure type: curette, suction and forceps   Post-procedure details:    Inspection:  TM intact and canal normal   Hearing quality:  Improved   Procedure completion:  Tolerated well, no immediate complications Comments:     TMs are clear bilaterally.  Assessment: Bilateral cerumen buildup and patient wearing hearing aids.  Plan: This was cleaned in the office.  TMs were otherwise clear.  He will follow-up as needed.   Radene Journey, MD   CC:

## 2020-11-16 ENCOUNTER — Other Ambulatory Visit: Payer: Self-pay | Admitting: *Deleted

## 2020-11-16 MED ORDER — PANTOPRAZOLE SODIUM 40 MG PO TBEC: 40.0000 mg | DELAYED_RELEASE_TABLET | Freq: Every day | ORAL | 1 refills | Status: DC

## 2020-11-21 DIAGNOSIS — G4733 Obstructive sleep apnea (adult) (pediatric): Secondary | ICD-10-CM | POA: Diagnosis not present

## 2020-11-29 ENCOUNTER — Encounter: Payer: Self-pay | Admitting: Internal Medicine

## 2020-11-29 ENCOUNTER — Other Ambulatory Visit: Payer: Self-pay

## 2020-11-29 ENCOUNTER — Ambulatory Visit: Payer: Medicare Other | Admitting: Internal Medicine

## 2020-11-29 VITALS — BP 112/62 | HR 61 | Temp 97.8°F | Ht 68.0 in | Wt 179.0 lb

## 2020-11-29 DIAGNOSIS — G4733 Obstructive sleep apnea (adult) (pediatric): Secondary | ICD-10-CM | POA: Diagnosis not present

## 2020-11-29 NOTE — Patient Instructions (Signed)
We will order a home sleep study and follow up with Dr Halford Chessman first available after the study is done

## 2020-11-29 NOTE — Assessment & Plan Note (Signed)
NPSG 2005:  AHI 66/hr c/w severe OSA Titration study to optimal pressure 10cm  Auto 2012:  Optimal pressure 15cm - cpap autoset re-ordered 12/21/2014 with download per Dr Janee Morn rec    - 11/29/2020  30 download = use > 4h x 28 days, avh 8/day cpap 11 with AHI  2.4 but no sleep study > 5 y so rec  HST and f/u with Sood   Clinically doing well just needs new equipment and advised needs new study to provide it         Each maintenance medication was reviewed in detail including emphasizing most importantly the difference between maintenance and prns and under what circumstances the prns are to be triggered using an action plan format where appropriate.  Total time for H and P, chart review, counseling, reviewing cpap device(s) / download and generating customized AVS unique to this "new pt" office visit / same day charting = 39 min

## 2020-11-29 NOTE — Progress Notes (Signed)
Subjective:    Patient ID: Eric Richmond, male    DOB: 1940/10/26,    MRN: [id number]     Brief patient profile:  55  yobm  Quit smoking in his mid 38's then picked back up summer 2016 prev followed by [facility] with dx of osa and self referred back to pulmonary clinic 12/21/2014    12/21/2014 1st [place] office visit/ Roseanna Koplin   Chief Complaint  Patient presents with   SLEEP CONSULT    Former [place] pt last seen in 2012 for OSA: pt currently  using CPAP every night for about 5 - 6 hours. pt wife states she can hear him almost as if he is choking while asleep even with the CPAP on. Epworth Score : 12 DME: [facility]  drowsiness not much different than baseline but maybe somewhat - note Epworth 18 on Dr Janifer Adie prev eval  Pt not aware of a problem but sometimes  does not feel rested in am and wife thinks he snores more on cpap then was the case 6 months ago. The patient also notices that the lights blink unusually on the machine and he has to unplug then plug it in again several times before all the lights go on correctly Rec F/u sleep medicine : Sood     11/29/2020  f/u ov/Jaquelin Meaney re: osa on cpap maint on cpap x 6-7  h  Chief Complaint  Patient presents with   Follow-up    Patient needs a follow up on his CPAP and is not sure he is on the right settings.   Summer  2022 noted not as comfortable on settings / feels resting in am regardless Walking around track up to  2-3 miles in one hour, also does bike Sleeps on back / bed is flat/ 2 pillows    No obvious day to day or daytime variability or assoc excess/ purulent sputum or mucus plugs or hemoptysis or cp or chest tightness, subjective wheeze or overt sinus or hb symptoms.    Also denies any obvious fluctuation of symptoms with weather or environmental changes or other aggravating or alleviating factors except as outlined above   No unusual exposure hx or h/o childhood pna/ asthma or knowledge of premature birth.  Current Allergies, Complete  Past Medical History, Past Surgical History, Family History, and Social History were reviewed in [employer] record.  ROS  The following are not active complaints unless bolded Hoarseness, sore throat, dysphagia, dental problems, itching, sneezing,  nasal congestion or discharge of excess mucus or purulent secretions, ear ache,   fever, chills, sweats, unintended wt loss or wt gain, classically pleuritic or exertional cp,  orthopnea pnd or arm/hand swelling  or leg swelling, presyncope, palpitations, abdominal pain, anorexia, nausea, vomiting, diarrhea  or change in bowel habits or change in bladder habits, change in stools or change in urine, dysuria, hematuria,  rash, arthralgias, visual complaints, headache, numbness, weakness or ataxia or problems with walking or coordination,  change in mood or  memory.        Current Meds  Medication Sig   albuterol (VENTOLIN HFA) 108 (90 Base) MCG/ACT inhaler Inhale 2 puffs into the lungs every 6 (six) hours as needed for wheezing or shortness of breath.   amLODipine (NORVASC) 10 MG tablet Take 1 tablet (10 mg total) by mouth daily.   ascorbic acid (VITAMIN C) 500 MG tablet Take 1 tablet (500 mg total) by mouth daily.   aspirin EC 81 MG tablet Take  81 mg by mouth daily.   diazepam (VALIUM) 2 MG tablet Take 1 tablet (2 mg total) by mouth every 8 (eight) hours as needed (dizziness).   escitalopram (LEXAPRO) 20 MG tablet Take 1 tablet (20 mg total) by mouth daily.   fluticasone (FLONASE) 50 MCG/ACT nasal spray Place 2 sprays into both nostrils daily.   losartan-hydrochlorothiazide (HYZAAR) 100-25 MG tablet Take 1 tablet by mouth every morning.   memantine (NAMENDA) 10 MG tablet Take 1 tablet (10 mg total) by mouth 2 (two) times daily.   Multiple Vitamin (MULTIVITAMIN WITH MINERALS) TABS tablet Take 1 tablet by mouth daily.   pantoprazole (PROTONIX) 40 MG tablet Take 1 tablet (40 mg total) by mouth daily.                            Objective:   Physical Exam    11/29/2020       179   12/21/14 208 lb (94.348 kg)  12/14/14 214 lb 4.8 oz (97.206 kg)  11/18/14 199 lb (90.266 kg)     Vital signs reviewed  11/29/2020  - Note at rest 02 sats  97% on RA   General appearance:    pleasant bm nad     HEENT : pt wearing mask not removed for exam due to covid -19 concerns.    NECK :  without JVD/Nodes/TM/ nl carotid upstrokes bilaterally   LUNGS: no acc muscle use,  Nl contour chest which is clear to A and P bilaterally without cough on insp or exp maneuvers   CV:  RRR  no s3 or murmur or increase in P2, and no edema   ABD:  soft and nontender with nl inspiratory excursion in the supine position. No bruits or organomegaly appreciated, bowel sounds nl  MS:  Nl gait/ ext warm without deformities, calf tenderness, cyanosis or clubbing No obvious joint restrictions   SKIN: warm and dry without lesions    NEURO:  alert,    no motor or cerebellar deficits apparent.   Wed / ? Month / ? Year /summer           Assessment & Plan:

## 2020-12-05 ENCOUNTER — Ambulatory Visit (INDEPENDENT_AMBULATORY_CARE_PROVIDER_SITE_OTHER): Payer: Medicare Other

## 2020-12-05 ENCOUNTER — Other Ambulatory Visit: Payer: Self-pay

## 2020-12-05 VITALS — BP 124/77 | HR 66 | Temp 98.4°F | Ht 68.0 in | Wt 179.0 lb

## 2020-12-05 DIAGNOSIS — Z0000 Encounter for general adult medical examination without abnormal findings: Secondary | ICD-10-CM

## 2020-12-05 NOTE — Patient Instructions (Signed)
Eric Richmond , Thank you for taking time to come for your Medicare Wellness Visit. I appreciate your ongoing commitment to your health goals. Please review the following plan we discussed and let me know if I can assist you in the future.   Screening recommendations/referrals: Colonoscopy: no  longer required  Recommended yearly ophthalmology/optometry visit for glaucoma screening and checkup Recommended yearly dental visit for hygiene and checkup  Vaccinations: Influenza vaccine: due in fall 2022  Pneumococcal vaccine: completed  Tdap vaccine: 12/16/2017 Shingles vaccine: will consider     Advanced directives: will provided copies   Conditions/risks identified: none   Next appointment: none   Preventive Care 71 Years and Older, Male Preventive care refers to lifestyle choices and visits with your health care provider that can promote health and wellness. What does preventive care include? A yearly physical exam. This is also called an annual well check. Dental exams once or twice a year. Routine eye exams. Ask your health care provider how often you should have your eyes checked. Personal lifestyle choices, including: Daily care of your teeth and gums. Regular physical activity. Eating a healthy diet. Avoiding tobacco and drug use. Limiting alcohol use. Practicing safe sex. Taking low doses of aspirin every day. Taking vitamin and mineral supplements as recommended by your health care provider. What happens during an annual well check? The services and screenings done by your health care provider during your annual well check will depend on your age, overall health, lifestyle risk factors, and family history of disease. Counseling  Your health care provider may ask you questions about your: Alcohol use. Tobacco use. Drug use. Emotional well-being. Home and relationship well-being. Sexual activity. Eating habits. History of falls. Memory and ability to understand  (cognition). Work and work Statistician. Screening  You may have the following tests or measurements: Height, weight, and BMI. Blood pressure. Lipid and cholesterol levels. These may be checked every 5 years, or more frequently if you are over 55 years old. Skin check. Lung cancer screening. You may have this screening every year starting at age 68 if you have a 30-pack-year history of smoking and currently smoke or have quit within the past 15 years. Fecal occult blood test (FOBT) of the stool. You may have this test every year starting at age 30. Flexible sigmoidoscopy or colonoscopy. You may have a sigmoidoscopy every 5 years or a colonoscopy every 10 years starting at age 63. Prostate cancer screening. Recommendations will vary depending on your family history and other risks. Hepatitis C blood test. Hepatitis B blood test. Sexually transmitted disease (STD) testing. Diabetes screening. This is done by checking your blood sugar (glucose) after you have not eaten for a while (fasting). You may have this done every 1-3 years. Abdominal aortic aneurysm (AAA) screening. You may need this if you are a current or former smoker. Osteoporosis. You may be screened starting at age 47 if you are at high risk. Talk with your health care provider about your test results, treatment options, and if necessary, the need for more tests. Vaccines  Your health care provider may recommend certain vaccines, such as: Influenza vaccine. This is recommended every year. Tetanus, diphtheria, and acellular pertussis (Tdap, Td) vaccine. You may need a Td booster every 10 years. Zoster vaccine. You may need this after age 30. Pneumococcal 13-valent conjugate (PCV13) vaccine. One dose is recommended after age 70. Pneumococcal polysaccharide (PPSV23) vaccine. One dose is recommended after age 55. Talk to your health care provider about which  screenings and vaccines you need and how often you need them. This  information is not intended to replace advice given to you by your health care provider. Make sure you discuss any questions you have with your health care provider. Document Released: 03/11/2015 Document Revised: 11/02/2015 Document Reviewed: 12/14/2014 Elsevier Interactive Patient Education  2017 [place] Prevention in the Home Falls can cause injuries. They can happen to people of all ages. There are many things you can do to make your home safe and to help prevent falls. What can I do on the outside of my home? Regularly fix the edges of walkways and driveways and fix any cracks. Remove anything that might make you trip as you walk through a door, such as a raised step or threshold. Trim any bushes or trees on the path to your home. Use bright outdoor lighting. Clear any walking paths of anything that might make someone trip, such as rocks or tools. Regularly check to see if handrails are loose or broken. Make sure that both sides of any steps have handrails. Any raised decks and porches should have guardrails on the edges. Have any leaves, snow, or ice cleared regularly. Use sand or salt on walking paths during winter. Clean up any spills in your garage right away. This includes oil or grease spills. What can I do in the bathroom? Use night lights. Install grab bars by the toilet and in the tub and shower. Do not use towel bars as grab bars. Use non-skid mats or decals in the tub or shower. If you need to sit down in the shower, use a plastic, non-slip stool. Keep the floor dry. Clean up any water that spills on the floor as soon as it happens. Remove soap buildup in the tub or shower regularly. Attach bath mats securely with double-sided non-slip rug tape. Do not have throw rugs and other things on the floor that can make you trip. What can I do in the bedroom? Use night lights. Make sure that you have a light by your bed that is easy to reach. Do not use any sheets or  blankets that are too big for your bed. They should not hang down onto the floor. Have a firm chair that has side arms. You can use this for support while you get dressed. Do not have throw rugs and other things on the floor that can make you trip. What can I do in the kitchen? Clean up any spills right away. Avoid walking on wet floors. Keep items that you use a lot in easy-to-reach places. If you need to reach something above you, use a strong step stool that has a grab bar. Keep electrical cords out of the way. Do not use floor polish or wax that makes floors slippery. If you must use wax, use non-skid floor wax. Do not have throw rugs and other things on the floor that can make you trip. What can I do with my stairs? Do not leave any items on the stairs. Make sure that there are handrails on both sides of the stairs and use them. Fix handrails that are broken or loose. Make sure that handrails are as long as the stairways. Check any carpeting to make sure that it is firmly attached to the stairs. Fix any carpet that is loose or worn. Avoid having throw rugs at the top or bottom of the stairs. If you do have throw rugs, attach them to the floor with carpet  tape. Make sure that you have a light switch at the top of the stairs and the bottom of the stairs. If you do not have them, ask someone to add them for you. What else can I do to help prevent falls? Wear shoes that: Do not have high heels. Have rubber bottoms. Are comfortable and fit you well. Are closed at the toe. Do not wear sandals. If you use a stepladder: Make sure that it is fully opened. Do not climb a closed stepladder. Make sure that both sides of the stepladder are locked into place. Ask someone to hold it for you, if possible. Clearly mark and make sure that you can see: Any grab bars or handrails. First and last steps. Where the edge of each step is. Use tools that help you move around (mobility aids) if they are  needed. These include: Canes. Walkers. Scooters. Crutches. Turn on the lights when you go into a dark area. Replace any light bulbs as soon as they burn out. Set up your furniture so you have a clear path. Avoid moving your furniture around. If any of your floors are uneven, fix them. If there are any pets around you, be aware of where they are. Review your medicines with your doctor. Some medicines can make you feel dizzy. This can increase your chance of falling. Ask your doctor what other things that you can do to help prevent falls. This information is not intended to replace advice given to you by your health care provider. Make sure you discuss any questions you have with your health care provider. Document Released: 12/09/2008 Document Revised: 07/21/2015 Document Reviewed: 03/19/2014 Elsevier Interactive Patient Education  2017 [employer].

## 2020-12-05 NOTE — Progress Notes (Signed)
Subjective:   Eric Richmond is a 80 y.o. male who presents for an Subsequent  Medicare Annual Wellness Visit.  Review of Systems          Objective:    Today's Vitals   12/05/20 1443  BP: 124/77  Pulse: 66  Temp: 98.4 F (36.9 C)  SpO2: 95%  Weight: 179 lb (81.2 kg)  Height: 5\' 8"  (1.727 m)   Body mass index is 27.22 kg/m.  Advanced Directives 12/05/2020 10/05/2020 06/13/2020 01/20/2020 12/04/2019 08/27/2019 08/11/2019  Does Patient Have a Medical Advance Directive? Yes No No No No Yes No  Type of Paramedic of [place];Living will - - - - Press photographer;Living will -  Does patient want to make changes to medical advance directive? - - - - - - No - Patient declined  Copy of [place] in Chart? No - copy requested - - - - - -  Would patient like information on creating a medical advance directive? - - - - No - Patient declined - No - Patient declined    Current Medications (verified) Outpatient Encounter Medications as of 12/05/2020  Medication Sig   albuterol (VENTOLIN HFA) 108 (90 Base) MCG/ACT inhaler Inhale 2 puffs into the lungs every 6 (six) hours as needed for wheezing or shortness of breath.   amLODipine (NORVASC) 10 MG tablet Take 1 tablet (10 mg total) by mouth daily.   ascorbic acid (VITAMIN C) 500 MG tablet Take 1 tablet (500 mg total) by mouth daily.   aspirin EC 81 MG tablet Take 81 mg by mouth daily.   diazepam (VALIUM) 2 MG tablet Take 1 tablet (2 mg total) by mouth every 8 (eight) hours as needed (dizziness).   escitalopram (LEXAPRO) 20 MG tablet Take 1 tablet (20 mg total) by mouth daily.   fluticasone (FLONASE) 50 MCG/ACT nasal spray Place 2 sprays into both nostrils daily.   losartan-hydrochlorothiazide (HYZAAR) 100-25 MG tablet Take 1 tablet by mouth every morning.   memantine (NAMENDA) 10 MG tablet Take 1 tablet (10 mg total) by mouth 2 (two) times daily.   Multiple Vitamin (MULTIVITAMIN WITH  MINERALS) TABS tablet Take 1 tablet by mouth daily.   pantoprazole (PROTONIX) 40 MG tablet Take 1 tablet (40 mg total) by mouth daily.   [DISCONTINUED] atenolol (TENORMIN) 50 MG tablet TAKE 1 TABLET EVERY DAY (NEED MD APPOINTMENT FOR REFILLS) (Patient taking differently: Take 50 mg by mouth daily. )   No facility-administered encounter medications on file as of 12/05/2020.    Allergies (verified) Oxycodone and Tramadol   History: Past Medical History:  Diagnosis Date   Allergy    Anal fissure 20 years ago   COVID-19 01/2019   Diabetes mellitus without complication ([place]) 0/27/2536   ED (erectile dysfunction)    Essential hypertension 10/22/2006   Qualifier: Diagnosis of  By: Sarajane Jews MD, Ishmael Holter    GERD (gastroesophageal reflux disease)    Hard of hearing    Mild neurocognitive disorder 01/06/2019   OSA (obstructive sleep apnea)    per Dr. Gwenette Greet , cpap -2.0   Osteoarthritis    Right knee and right thumb; s/p total knee replacement   Pneumonia 2015   Past Surgical History:  Procedure Laterality Date   ANAL FISSURE REPAIR     colonoscopy  08/07/2016   per Dr. Hilarie Fredrickson, adenomatous polyps removed, repeat in 3 years    KNEE ARTHROSCOPY     left knee   KNEE CLOSED  REDUCTION Left 01/13/2014   Procedure: CLOSED MANIPULATION LEFT KNEE;  Surgeon: Gearlean Alf, MD;  Location: [facility];  Service: Orthopedics;  Laterality: Left;   SURGERY AGE 5 OR 7 ON LEFT KNEE FOR A "GLAND BEHIND THE KNEE"     TOTAL KNEE ARTHROPLASTY Left 10/26/2013   Procedure: LEFT TOTAL KNEE ARTHROPLASTY;  Surgeon: Gearlean Alf, MD;  Location: [facility];  Service: Orthopedics;  Laterality: Left;   Family History  Problem Relation Age of Onset   Breast cancer Sister    Diabetes Sister    Hypertension Sister    Thyroid cancer Sister    Diabetes Mother    Hypertension Mother    Diabetes Father    Hypertension Father    Diabetes Sister    Hypertension Other    Diabetes Brother    Hypertension Brother    Heart  failure Brother    Heart failure Brother    Diabetes Sister    Colon cancer Neg Hx    Esophageal cancer Neg Hx    Rectal cancer Neg Hx    Stomach cancer Neg Hx    Social History   Socioeconomic History   Marital status: Married    Spouse name: [name]   Number of children: 2   Years of education: 12   Highest education level: High school graduate  Occupational History   Occupation: retired  Tobacco Use   Smoking status: Former    Packs/day: 0.30    Years: 20.00    Pack years: 6.00    Types: Cigarettes    Quit date: 02/26/1977    Years since quitting: 43.8   Smokeless tobacco: Former    Types: Nurse, children's Use: Never used  Substance and Sexual Activity   Alcohol use: No    Alcohol/week: 0.0 standard drinks   Drug use: No   Sexual activity: Not Currently    Partners: Female  Other Topics Concern   Not on file  Social History Narrative   Epworth Sleepiness Scale = 5 (12/14/2014)      Right handed      Graduated HS      Lives with wife   Social Determinants of Health   Financial Resource Strain: Low Risk    Difficulty of Paying Living Expenses: Not hard at all  Food Insecurity: No Food Insecurity   Worried About Charity fundraiser in the Last Year: Never true   [place] in the Last Year: Never true  Transportation Needs: No Transportation Needs   Lack of Transportation (Medical): No   Lack of Transportation (Non-Medical): No  Physical Activity: Insufficiently Active   Days of Exercise per Week: 1 day   Minutes of Exercise per Session: 60 min  Stress: No Stress Concern Present   Feeling of Stress : Not at all  Social Connections: Moderately Integrated   Frequency of Communication with Friends and Family: Three times a week   Frequency of Social Gatherings with Friends and Family: Three times a week   Attends Religious Services: More than 4 times per year   Active Member of Clubs or Organizations: No   Attends Archivist  Meetings: Never   Marital Status: Married    Tobacco Counseling Counseling given: Not Answered   Clinical Intake:  Pre-visit preparation completed: Yes  Pain : No/denies pain     Nutritional Risks: None Diabetes: No  How often do you need to have someone help you when  you read instructions, pamphlets, or other written materials from your doctor or pharmacy?: 1 - Never What is the last grade level you completed in school?: High School  Diabetic?no  Interpreter Needed?: No  Information entered by :: l.Yulian Gosney,LPn   Activities of Daily Living In your present state of health, do you have any difficulty performing the following activities: 12/05/2020  Hearing? N  Vision? N  Difficulty concentrating or making decisions? Y  Comment dementia  Walking or climbing stairs? N  Dressing or bathing? N  Doing errands, shopping? N  Preparing Food and eating ? N  Using the Toilet? N  In the past six months, have you accidently leaked urine? N  Do you have problems with loss of bowel control? N  Managing your Medications? N  Managing your Finances? N  Housekeeping or managing your Housekeeping? N  Some recent data might be hidden    Patient Care Team: [name], MD as PCP - General Delice Lesch Lezlie Octave, MD as Consulting Physician (Neurology) Laretta Alstrom, Argie Ramming, RN as [place], [place], [facility] as Pharmacist (Pharmacist)  Indicate any recent Medical Services you may have received from other than [facility] providers in the past year (date may be approximate).     Assessment:   This is a routine wellness examination for [place].  Hearing/Vision screen Vision Screening - Comments:: Annual eye exams wear glasses   Dietary issues and exercise activities discussed: Current Exercise Habits: Home exercise routine, Type of exercise: walking, Time (Minutes): 60, Frequency (Times/Week): 1, Weekly Exercise (Minutes/Week): 60, Intensity: Mild, Exercise limited  by: psychological condition(s) (dementia)   Goals Addressed             This Visit's Progress    [facility]) Wife will verbalize taking the patient's B/P and recording the values daily within the next 90 days   On track    Timeframe:  Long-Range Goal Priority:  High Start Date: 03/24/20                           Expected End Date: 03/27/21                      Follow Up Date 02/24/21    - check blood pressure daily - write blood pressure results in a log or diary  -Encouraged limiting the amount of salt intake and maintaining a healthy diet -Discussed bringing the calendar booklet with the recorded B/P values to [name] appointments -Encouraged increasing physical activity as tolerated, continuation of going to the [facility] and, playing pool with his friend   Why is this important?   You won't feel high blood pressure, but it can still hurt your blood vessels.  High blood pressure can cause heart or kidney problems. It can also cause a stroke.  Making lifestyle changes like losing a little weight or eating less salt will help.  Checking your blood pressure at home and at different times of the day can help to control blood pressure.  If the doctor prescribes medicine remember to take it the way the doctor ordered.  Call the office if you cannot afford the medicine or if there are questions about it.     Notes: Updated 11/07/20: Wife states that she takes the patient's blood pressure most days and records his values. She explains that the booklet she records that the values in are not with her for her to report the  numbers but they have been really good recently. Wife states that the patient's dizziness has improved and reports the patient continues to drive short distances to the [facility] and to go play pool with his friend.  Updated 06/22/20: Wife reports that she did receive the calendar booklet and she is taking the patient's B/P several times weekly. Wife states that she will begin to take the  patient's B/P daily and record the values. She states she is also using the calendar to record the patient's blood sugar values and verbalized understanding about taking the calendar booklet to the appointments with [name] for him to review the values.   03/24/20: Nurse provided education about importance of taking B/P daily and recording the values. Wife states she will begin to monitor the patient's B/P daily and record the values. Nurse will send a calendar booklet for the wife to record the values.         Depression Screen PHQ 2/9 Scores 12/05/2020 12/05/2020 12/04/2019 07/09/2019 06/11/2019 02/23/2019 08/13/2017  PHQ - 2 Score 0 0 0 0 0 0 0  PHQ- 9 Score - - 0 - - - -    Fall Risk Fall Risk  12/05/2020 11/07/2020 10/05/2020 06/22/2020 03/24/2020  Falls in the past year? 0 1 0 1 0  Number falls in past yr: 0 1 0 0 0  Injury with Fall? 0 0 0 0 0  Risk for fall due to : - History of fall(s);Impaired balance/gait;Impaired mobility - History of fall(s);Impaired balance/gait;Impaired mobility (No Data)  Risk for fall due to: Comment - chronic dizziness - chronic dizziness chronic dizziness  Follow up Falls evaluation completed Falls prevention discussed;Education provided;Falls evaluation completed - Falls prevention discussed;Education provided;Falls evaluation completed Falls prevention discussed;Education provided;Falls evaluation completed    FALL RISK PREVENTION PERTAINING TO THE HOME:  Any stairs in or around the home? No  If so, are there any without handrails? No  Home free of loose throw rugs in walkways, pet beds, electrical cords, etc? Yes  Adequate lighting in your home to reduce risk of falls? Yes   ASSISTIVE DEVICES UTILIZED TO PREVENT FALLS:  Life alert? No  Use of a cane, walker or w/c? Yes  Grab bars in the bathroom? Yes  Shower chair or bench in shower? No  Elevated toilet seat or a handicapped toilet? No   TIMED UP AND GO:  Was the test performed? Yes .  Length of  time to ambulate 10 feet: 7 sec.   Gait steady and fast without use of assistive device  Cognitive Function:Cognitive status assessed by direct observation. Patient has current diagnosis of cognitive impairment. Patient is followed by neurology for ongoing assessment. Patient is unable to complete screening 6CIT or MMSE.   MMSE - Mini Mental State Exam 10/05/2020 08/13/2017 08/09/2016  Orientation to time 3 4 4   Orientation to time comments - - Disoriented to date.  Orientation to Place 5 5 5   Registration 3 3 3   Attention/ Calculation 2 4 4   Recall 0 2 1  Language- name 2 objects 2 2 2   Language- repeat 1 1 1   Language- follow 3 step command 1 3 3   Language- read & follow direction 1 1 1   Write a sentence 0 1 1  Copy design 0 0 0  Total score 18 26 25    Montreal Cognitive Assessment  12/02/2018 04/25/2018  Visuospatial/ Executive (0/5) 1 1  Naming (0/3) 2 3  Attention: Read list of digits (0/2) 1 1  Attention: Read list of letters (0/1) 1 1  Attention: Serial 7 subtraction starting at 100 (0/3) 2 1  Language: Repeat phrase (0/2) 1 1  Language : Fluency (0/1) 0 0  Abstraction (0/2) 0 0  Delayed Recall (0/5) 0 0  Orientation (0/6) 4 2  Total 12 10  Adjusted Score (based on education) 13 11      Immunizations Immunization History  Administered Date(s) Administered   Fluad Quad(high Dose 65+) 12/15/2018   Influenza Split 01/10/2013, 11/18/2019   Influenza Whole 11/21/2009, 01/17/2010   Influenza, High Dose Seasonal PF 11/18/2014, 12/30/2015, 12/12/2016   Influenza,inj,Quad PF,6+ Mos 12/15/2013   Pneumococcal Conjugate-13 12/15/2013   Pneumococcal Polysaccharide-23 06/02/2015   Tdap 12/12/2017, 12/16/2017    TDAP status: Up to date  Flu Vaccine status: Up to date  Pneumococcal vaccine status: Up to date  Covid-19 vaccine status: Completed vaccines  Qualifies for Shingles Vaccine? Yes   Zostavax completed No   Shingrix Completed?: No.    Education has been provided  regarding the importance of this vaccine. Patient has been advised to call insurance company to determine out of pocket expense if they have not yet received this vaccine. Advised may also receive vaccine at local pharmacy or Health Dept. Verbalized acceptance and understanding.  Screening Tests Health Maintenance  Topic Date Due   FOOT EXAM  Never done   OPHTHALMOLOGY EXAM  Never done   HEMOGLOBIN A1C  06/08/2020   COVID-19 Vaccine (1) 12/15/2020 (Originally 01/08/1941)   Zoster Vaccines- Shingrix (1 of 2) 03/01/2021 (Originally 07/09/1959)   INFLUENZA VACCINE  05/26/2021 (Originally 09/26/2020)   COLONOSCOPY (Pts 45-38yrs Insurance coverage will need to be confirmed)  04/09/2022   TETANUS/TDAP  12/17/2027   HPV VACCINES  Aged Out    Health Maintenance  Health Maintenance Due  Topic Date Due   FOOT EXAM  Never done   OPHTHALMOLOGY EXAM  Never done   HEMOGLOBIN A1C  06/08/2020    Colorectal cancer screening: No longer required.   Lung Cancer Screening: (Low Dose CT Chest recommended if Age 28-80 years, 30 pack-year currently smoking OR have quit w/in 15years.) does not qualify.   Lung Cancer Screening Referral: n/a  Additional Screening:  Hepatitis C Screening: does not qualify;   Vision Screening: Recommended annual ophthalmology exams for early detection of glaucoma and other disorders of the eye. Is the patient up to date with their annual eye exam?  Yes  Who is the provider or  what is the name of the office in which the patient attends annual eye exams? Dr.Scott If pt is not established with a provider, would they like to be referred to a provider to establish care? No .   Dental Screening: Recommended annual dental exams for proper oral hygiene  Community Resource Referral / Chronic Care Management: CRR required this visit?  No   CCM required this visit?  No      Plan:     I have personally reviewed and noted the following in the patient's chart:   Medical  and social history Use of alcohol, tobacco or illicit drugs  Current medications and supplements including opioid prescriptions. Patient is not currently taking opioid prescriptions. Functional ability and status Nutritional status Physical activity Advanced directives List of other physicians Hospitalizations, surgeries, and ER visits in previous 12 months Vitals Screenings to include cognitive, depression, and falls Referrals and appointments  In addition, I have reviewed and discussed with patient certain preventive protocols, quality metrics, and best practice recommendations.  A written personalized care plan for preventive services as well as general preventive health recommendations were provided to patient.     Randel Pigg, LPN   14/64/3142   Nurse Notes: none

## 2020-12-09 ENCOUNTER — Ambulatory Visit (INDEPENDENT_AMBULATORY_CARE_PROVIDER_SITE_OTHER): Payer: Medicare Other | Admitting: Family Medicine

## 2020-12-09 ENCOUNTER — Encounter: Payer: Self-pay | Admitting: Family Medicine

## 2020-12-09 ENCOUNTER — Other Ambulatory Visit: Payer: Self-pay

## 2020-12-09 VITALS — BP 124/80 | HR 56 | Temp 97.8°F | Ht 68.0 in | Wt 178.8 lb

## 2020-12-09 DIAGNOSIS — N401 Enlarged prostate with lower urinary tract symptoms: Secondary | ICD-10-CM | POA: Diagnosis not present

## 2020-12-09 DIAGNOSIS — R42 Dizziness and giddiness: Secondary | ICD-10-CM

## 2020-12-09 DIAGNOSIS — I10 Essential (primary) hypertension: Secondary | ICD-10-CM | POA: Diagnosis not present

## 2020-12-09 DIAGNOSIS — E119 Type 2 diabetes mellitus without complications: Secondary | ICD-10-CM

## 2020-12-09 DIAGNOSIS — K219 Gastro-esophageal reflux disease without esophagitis: Secondary | ICD-10-CM | POA: Diagnosis not present

## 2020-12-09 DIAGNOSIS — R739 Hyperglycemia, unspecified: Secondary | ICD-10-CM

## 2020-12-09 DIAGNOSIS — M1712 Unilateral primary osteoarthritis, left knee: Secondary | ICD-10-CM | POA: Diagnosis not present

## 2020-12-09 DIAGNOSIS — N138 Other obstructive and reflux uropathy: Secondary | ICD-10-CM

## 2020-12-09 DIAGNOSIS — G3184 Mild cognitive impairment, so stated: Secondary | ICD-10-CM | POA: Diagnosis not present

## 2020-12-09 LAB — HEPATIC FUNCTION PANEL
ALT: 12 U/L (ref 0–53)
AST: 21 U/L (ref 0–37)
Albumin: 4.1 g/dL (ref 3.5–5.2)
Alkaline Phosphatase: 67 U/L (ref 39–117)
Bilirubin, Direct: 0.2 mg/dL (ref 0.0–0.3)
Total Bilirubin: 0.7 mg/dL (ref 0.2–1.2)
Total Protein: 6.4 g/dL (ref 6.0–8.3)

## 2020-12-09 LAB — LIPID PANEL
Cholesterol: 145 mg/dL (ref 0–200)
HDL: 47.7 mg/dL (ref >39.00)
LDL Cholesterol: 76 mg/dL (ref 0–99)
NonHDL: 97.27
Total CHOL/HDL Ratio: 3
Triglycerides: 107 mg/dL (ref 0.0–149.0)
VLDL: 21.4 mg/dL (ref 0.0–40.0)

## 2020-12-09 LAB — PSA: PSA: 1.39 ng/mL (ref 0.10–4.00)

## 2020-12-09 LAB — CBC WITH DIFFERENTIAL/PLATELET
Basophils Absolute: 0 10*3/uL (ref 0.0–0.1)
Basophils Relative: 0.4 % (ref 0.0–3.0)
Eosinophils Absolute: 0.2 10*3/uL (ref 0.0–0.7)
Eosinophils Relative: 4.3 % (ref 0.0–5.0)
HCT: 39.9 % (ref 39.0–52.0)
Hemoglobin: 13.4 g/dL (ref 13.0–17.0)
Lymphocytes Relative: 35.6 % (ref 12.0–46.0)
Lymphs Abs: 2 10*3/uL (ref 0.7–4.0)
MCHC: 33.7 g/dL (ref 30.0–36.0)
MCV: 87.4 fl (ref 78.0–100.0)
Monocytes Absolute: 0.4 10*3/uL (ref 0.1–1.0)
Monocytes Relative: 7.7 % (ref 3.0–12.0)
Neutro Abs: 2.9 10*3/uL (ref 1.4–7.7)
Neutrophils Relative %: 52 % (ref 43.0–77.0)
Platelets: 141 10*3/uL — ABNORMAL LOW (ref 150.0–400.0)
RBC: 4.56 Mil/uL (ref 4.22–5.81)
RDW: 14.1 % (ref 11.5–15.5)
WBC: 5.5 10*3/uL (ref 4.0–10.5)

## 2020-12-09 LAB — HEMOGLOBIN A1C: Hgb A1c MFr Bld: 6.1 % (ref 4.6–6.5)

## 2020-12-09 LAB — TSH: TSH: 2.74 u[IU]/mL (ref 0.35–5.50)

## 2020-12-09 LAB — BASIC METABOLIC PANEL
BUN: 30 mg/dL — ABNORMAL HIGH (ref 6–23)
CO2: 30 mEq/L (ref 19–32)
Calcium: 9.2 mg/dL (ref 8.4–10.5)
Chloride: 101 mEq/L (ref 96–112)
Creatinine, Ser: 1.25 mg/dL (ref 0.40–1.50)
GFR: 54.42 mL/min — ABNORMAL LOW (ref >60.00)
Glucose, Bld: 99 mg/dL (ref 70–99)
Potassium: 4.1 mEq/L (ref 3.5–5.1)
Sodium: 139 mEq/L (ref 135–145)

## 2020-12-09 MED ORDER — MEMANTINE HCL 10 MG PO TABS: 10.0000 mg | ORAL_TABLET | Freq: Two times a day (BID) | ORAL | 3 refills | Status: DC

## 2020-12-09 MED ORDER — AMLODIPINE BESYLATE 10 MG PO TABS: 10.0000 mg | ORAL_TABLET | Freq: Every day | ORAL | 3 refills | Status: DC

## 2020-12-09 MED ORDER — DIAZEPAM 2 MG PO TABS: 2.0000 mg | ORAL_TABLET | Freq: Three times a day (TID) | ORAL | 3 refills | Status: DC | PRN

## 2020-12-09 MED ORDER — PANTOPRAZOLE SODIUM 40 MG PO TBEC: 40.0000 mg | DELAYED_RELEASE_TABLET | Freq: Every day | ORAL | 3 refills | Status: DC

## 2020-12-09 MED ORDER — LOSARTAN POTASSIUM-HCTZ 100-25 MG PO TABS: 1.0000 | ORAL_TABLET | Freq: Every morning | ORAL | 3 refills | Status: DC

## 2020-12-09 MED ORDER — ESCITALOPRAM OXALATE 20 MG PO TABS: 20.0000 mg | ORAL_TABLET | Freq: Every day | ORAL | 3 refills | Status: DC

## 2020-12-09 NOTE — Progress Notes (Signed)
Subjective:    Patient ID: Eric Richmond, male    DOB: 22-Dec-1940, 80 y.o.   MRN: [id number]  HPI Here to follow up on issues. He feels well and he has no complaints. His wife thinks his cognitive disorder has stabilized. His BP is stable. He walks every day. The GERD is stable.    Review of Systems  Constitutional: Negative.   HENT: Negative.    Eyes: Negative.   Respiratory: Negative.    Cardiovascular: Negative.   Gastrointestinal: Negative.   Genitourinary: Negative.   Musculoskeletal: Negative.   Skin: Negative.   Neurological: Negative.   Psychiatric/Behavioral: Negative.        Objective:   Physical Exam Constitutional:      General: He is not in acute distress.    Appearance: Normal appearance. He is well-developed. He is not diaphoretic.  HENT:     Head: Normocephalic and atraumatic.     Right Ear: External ear normal.     Left Ear: External ear normal.     Nose: Nose normal.     Mouth/Throat:     Pharynx: No oropharyngeal exudate.  Eyes:     General: No scleral icterus.       Right eye: No discharge.        Left eye: No discharge.     Conjunctiva/sclera: Conjunctivae normal.     Pupils: Pupils are equal, round, and reactive to light.  Neck:     Thyroid: No thyromegaly.     Vascular: No JVD.     Trachea: No tracheal deviation.  Cardiovascular:     Rate and Rhythm: Normal rate and regular rhythm.     Heart sounds: Normal heart sounds. No murmur heard.   No friction rub. No gallop.  Pulmonary:     Effort: Pulmonary effort is normal. No respiratory distress.     Breath sounds: Normal breath sounds. No wheezing or rales.  Chest:     Chest wall: No tenderness.  Abdominal:     General: Bowel sounds are normal. There is no distension.     Palpations: Abdomen is soft. There is no mass.     Tenderness: There is no abdominal tenderness. There is no guarding or rebound.  Genitourinary:    Penis: No tenderness.   Musculoskeletal:        General: No  tenderness. Normal range of motion.     Cervical back: Neck supple.  Lymphadenopathy:     Cervical: No cervical adenopathy.  Skin:    General: Skin is warm and dry.     Coloration: Skin is not pale.     Findings: No erythema or rash.  Neurological:     Mental Status: He is alert and oriented to person, place, and time.     Cranial Nerves: No cranial nerve deficit.     Motor: No abnormal muscle tone.     Coordination: Coordination normal.     Deep Tendon Reflexes: Reflexes are normal and symmetric. Reflexes normal.  Psychiatric:        Behavior: Behavior normal.        Thought Content: Thought content normal.        Judgment: Judgment normal.          Assessment & Plan:  He is doing well overall. The HTN and GERD are stable. His cognitive disorder is stable for now. 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 We will get fasting labs to check lipids, etc.check an A1c for the diabetes. We  spent a total of (  30 ) minutes reviewing records and discussing these issues.  Alysia Penna, MD

## 2021-01-30 ENCOUNTER — Other Ambulatory Visit: Payer: Self-pay | Admitting: *Deleted

## 2021-01-30 NOTE — Patient Outreach (Signed)
[facility] [facility]) Care Management  01/30/2021  Miklo Aken Huie 09-14-1940 [id number]  Unsuccessful outreach attempt made to patient. RN Health Coach left HIPAA compliant voicemail message along with her contact information.  Plan: RN Health Coach will call patient within the month of January.  [name] RN, BSN [place] [phone] [email or web address]

## 2021-02-06 ENCOUNTER — Ambulatory Visit: Payer: Medicare Other | Admitting: *Deleted

## 2021-03-01 DIAGNOSIS — G4733 Obstructive sleep apnea (adult) (pediatric): Secondary | ICD-10-CM | POA: Diagnosis not present

## 2021-03-15 ENCOUNTER — Other Ambulatory Visit: Payer: Self-pay | Admitting: *Deleted

## 2021-03-15 ENCOUNTER — Encounter: Payer: Self-pay | Admitting: *Deleted

## 2021-03-15 NOTE — Patient Outreach (Signed)
Eric Richmond) Care Management  03/15/2021  Eric Richmond 12/17/1940 [id number]  Eric Richmond) Care Management RN Health Coach Note   03/15/2021 Name:  Eric Richmond MRN:  [id number] DOB:  1940-06-07  Summary: Successful telephone outreach call to patient's spouse Eric Richmond. HIPAA identifiers obtained. Wife states that the patient is doing fairly well. His vertigo has improved and his blood pressure is currently under control. Patient continues to go to the [facility] to walk and play pool. Wife discussed that his memory is declining and that they have a follow up appointment with neurology 04/07/21. 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 Nurse will send spouse resource information regarding caregiver support groups and adult day care centers for respite care. Wife did not have any further questions or concerns today and did confirm that she has this nurse's contact number to call her if needed.   Recommendations/Changes made from today's visit: Continue to walk and play pool at the [facility] several times a week Continue to eat a low sodium diet Monitor the amount of water drunk to ensure you are hydrated Continue to monitor B/P daily and record the values  Subjective: Eric Richmond is an 81 y.o. year old male who is a primary patient of [name], MD. The care management team was consulted for assistance with care management and/or care coordination needs.    RN Health Coach completed Telephone Visit today.   Objective:  Medications Reviewed Today     Reviewed by Michiel Cowboy, RN (Registered Nurse) on 03/15/21 at 1345  Med List Status: <None>   Medication Order Taking? Sig Documenting Provider Last Dose Status Informant  albuterol (VENTOLIN HFA) 108 (90 Base) MCG/ACT inhaler [id number] Yes Inhale 2 puffs into the lungs every 6 (six) hours as needed for wheezing or shortness of breath. [name], MD Taking Active             Med Note Marcha Dutton, [place] Nov 29, 2020 11:35 AM)    amLODipine (NORVASC) 10 MG tablet [id number] Yes Take 1 tablet (10 mg total) by mouth daily. [name], MD Taking Active   ascorbic acid (VITAMIN C) 500 MG tablet [id number] Yes Take 1 tablet (500 mg total) by mouth daily. Caren Griffins, MD Taking Active Self  aspirin EC 81 MG tablet [id number] Yes Take 81 mg by mouth daily. [provider] Taking Active Self   Patient taking differently:   Discontinued 02/23/19 2152 diazepam (VALIUM) 2 MG tablet [id number] Yes Take 1 tablet (2 mg total) by mouth every 8 (eight) hours as needed (dizziness). [name], MD Taking Active   escitalopram (LEXAPRO) 20 MG tablet [id number] Yes Take 1 tablet (20 mg total) by mouth daily. [name], MD Taking Active   fluticasone [employer] [facility]) 50 MCG/ACT nasal spray [id number] Yes Place 2 sprays into both nostrils daily. [name], MD Taking Active Self  losartan-hydrochlorothiazide [facility]) 100-25 MG tablet [id number] Yes Take 1 tablet by mouth every morning. [name], MD Taking Active   memantine [place] [facility]) 10 MG tablet [id number] Yes Take 1 tablet (10 mg total) by mouth 2 (two) times daily. [name], MD Taking Active   Multiple Vitamin (MULTIVITAMIN WITH MINERALS) TABS tablet [id number] Yes Take 1 tablet by mouth daily. [provider] Taking Active Self  pantoprazole (PROTONIX) 40 MG tablet [id number] Yes Take 1 tablet (40 mg total) by mouth daily. [name], MD Taking  Active              SDOH:  (Social Determinants of Health) assessments and interventions performed: SDOH assessments completed today and documented in the Epic system.      Care Plan  Review of patient past medical history, allergies, medications, health status, including review of consultants reports, laboratory and other test data, was performed as part of comprehensive evaluation for care management services.   Care Plan : Hypertension (Adult)   Updates made by Michiel Cowboy, RN since 03/15/2021 12:00 AM     Problem: Hypertension (Hypertension) Resolved 03/15/2021  Priority: High     Long-Range Goal: Hypertension Monitored Completed 03/15/2021  Start Date: 03/24/2020  Expected End Date: 03/27/2021  Note:   Resolving due to duplicate goal  Evidence-based guidance:  Promote initial use of ambulatory blood pressure measurements (for 3 days) to rule out "white-coat" effect; identify masked hypertension and presence or absence of nocturnal "dipping" of blood pressure.   Encourage continued use of home blood pressure monitoring and recording in blood pressure log; include symptoms of hypotension or potential medication side effects in log.  Review blood pressure measurements taken inside and outside of the provider office; establish baseline and monitor trends; compare to target ranges or patient goal.  Share overall cardiovascular risk with patient; encourage changes to lifestyle risk factors, including alcohol consumption, smoking, inadequate exercise, poor dietary habits and stress.   Notes:     Task: Identify and Monitor Blood Pressure Elevation Completed 03/15/2021  Due Date: 03/27/2021  Note:   Care Management Activities:    - blood pressure trends reviewed - home or ambulatory blood pressure monitoring encouraged    Notes: Wife Eric Richmond states she will begin to check the patient's blood pressure daily and record the values. Nurse will send a calendar booklet to record the values.    Problem: Disease Progression (Hypertension) Resolved 03/15/2021  Priority: High     Long-Range Goal: Disease Progression Prevented or Minimized Completed 03/15/2021  Start Date: 03/24/2020  Expected End Date: 03/27/2021  Note:   Resolving due to duplicate goal  Evidence-based guidance:  Tailor lifestyle advice to individual; review progress regularly; give frequent encouragement and respond positively to incremental successes.  Assess for and  promote awareness of worsening disease or development of comorbidity.  Prepare patient for laboratory and diagnostic exams based on risk and presentation.  Prepare patient for use of pharmacologic therapy that may include diuretic, beta-blocker, beta-blocker/thiazide combination, angiotensin-converting enzyme inhibitor, renin-angiotensin blocker or calcium-channel blocker.  Expect periodic adjustments to pharmacologic therapy; manage side effects.  Promote a healthy diet that includes primarily plant-based foods, such as fruits, vegetables, whole grains, beans and legumes, low-fat dairy and lean meats.   Consider moderate reduction in sodium intake by avoiding the addition of salt to prepared foods and limiting processed meats, canned soup, frozen meals and salty snacks.   Promote a regular, daily exercise goal of 150 minutes per week of moderate exercise based on tolerance, ability and patient choice; consider referral to physical therapist, community wellness and/or activity program.  Encourage the avoidance of no more than 2 hours per day of sedentary activity, such as recreational screen time.  Review sources of stress; explore current coping strategies and encourage use of mindfulness, yoga, meditation or exercise to manage stress.   Notes:     Task: Alleviate Barriers to Hypertension Treatment Completed 03/15/2021  Due Date: 03/27/2021  Note:   Care Management Activities:    - healthy diet promoted -  medical nutrition therapy provided - pain assessed and managed - quality of sleep assessed - reduction in sedentary activities encouraged    Notes: Wife Eric [place] states that patient does limit sodium in his diet. He is staying active by walking at the [place] [facility]. He uses his C-PAP at night. Eric Richmond encourages the patient to drink 3 bottles of water daily. Wife states that the patient's back and leg pain have improved.    Care Plan : RN Care Manager Plan of Care  Updates made by Michiel Cowboy, RN since  03/15/2021 12:00 AM     Problem: Knowledge Deficit Related to Hypertension   Priority: High     Long-Range Goal: Development of Plan of Care for Management of Hypertension   Start Date: 03/15/2021  Expected End Date: 03/27/2021  Priority: High  Note:   Current Barriers:  Chronic Disease Management support and education needs related to HTN  Cognitive Deficits Spouse Eric Richmond assists patient   RNCM Clinical Goal(s):  Patient will demonstrate Ongoing adherence to prescribed treatment plan for HTN as evidenced by maintaining B/P < 150/90 continue to work with RN Care Manager to address care management and care coordination needs related to  HTN as evidenced by adherence to CM Team Scheduled appointments through collaboration with RN Care manager, provider, and care team.   Interventions: Inter-disciplinary care team collaboration (see longitudinal plan of care) Evaluation of current treatment plan related to  self management and patient's adherence to plan as established by provider   Hypertension Interventions:  (Status:  Goal on track:  Yes.) Long Term Goal Last practice recorded BP readings:  BP Readings from Last 3 Encounters:  12/09/20 124/80  12/05/20 124/77  11/29/20 112/62  Most recent eGFR/CrCl: No results found for: EGFR  No components found for: CRCL  Evaluation of current treatment plan related to hypertension self management and patient's adherence to plan as established by provider Reviewed medications with patient and discussed importance of compliance Discussed plans with patient for ongoing care management follow up and provided patient with direct contact information for care management team Provided education on prescribed diet low sodium, heart healthy diet Previously sent hypertension and DASH diet education Encouraged continuation of patient walking at the [facility] several times a week and playing pool Encouraged continuation of monitoring B/P daily and recording the  values  Patient Goals/Self-Care Activities: Take all medications as prescribed Attend all scheduled provider appointments Call provider office for new concerns or questions  check blood pressure daily write blood pressure results in a log or diary take blood pressure log to all doctor appointments call doctor for signs and symptoms of high blood pressure take medications for blood pressure exactly as prescribed report new symptoms to your doctor Continue to walk and play pool at the [facility] several times a week Continue to eat a low sodium diet Monitor the amount of water drunk to ensure you are hydrated  Follow Up Plan:  Telephone follow up appointment with care management team member scheduled for:  April       Plan: Telephone follow up appointment with care management team member scheduled for:  April. Nurse will send PCP a quarterly update.   [name] RN, [place] [phone] [email or web address]

## 2021-03-15 NOTE — Patient Instructions (Signed)
Visit Information  Thank you for taking time to visit with me today. Please don't hesitate to contact me if I can be of assistance to you before our next scheduled telephone appointment.  Following are the goals we discussed today:  Patient Goals/Self-Care Activities: Take all medications as prescribed Attend all scheduled provider appointments Call provider office for new concerns or questions  check blood pressure daily write blood pressure results in a log or diary take blood pressure log to all doctor appointments call doctor for signs and symptoms of high blood pressure take medications for blood pressure exactly as prescribed report new symptoms to your doctor Continue to walk and play pool at the [facility] several times a week Continue to eat a low sodium diet Monitor the amount of water drunk to ensure you are hydrated  Please call 1-800-273-TALK (toll free, 24 hour hotline) if you are experiencing a Mental Health or [place] or need someone to talk to.  The patient verbalized understanding of instructions, educational materials, and care plan provided today and agreed to receive a mailed copy of patient instructions, educational materials, and care plan.   Telephone follow up appointment with care management team member scheduled MEB:RAXEN  [name] RN, [place] [phone] [email or web address]

## 2021-03-21 ENCOUNTER — Encounter: Payer: Self-pay | Admitting: Internal Medicine

## 2021-04-06 NOTE — Progress Notes (Signed)
Assessment/Plan:    Mild dementia likely due to Alzheimer's disease  Mild decline in cognitive status, today's MMSE 14/30 (was 18 on October 05, 2020).  Patient is on donepezil 10 mg daily and memantine 10 mg twice daily.  For mood he is on Lexapro 20 mg daily, tolerating well.   Recommendations:  Discussed safety both in and out of the home.  Continue close supervision Discussed the importance of regular daily schedule with inclusion of crossword puzzles to maintain brain function.  Continue to monitor mood with Lexapro 20 mg daily.  Start Depakote 125 mg nightly for REM behavior Stay active at least 30 minutes at least 3 times a week.  Naps should be scheduled and should be no longer than 60 minutes and should not occur after 2 PM.  Mediterranean diet is recommended  Control cardiovascular risk factors we discussed he is low blood pressure today, and he agreed to contact his prescribing physician as soon as possible.   Continue vestibular exercises at home  Continue donepezil 10 mg daily Side effects were discussed Follow up in  6 months.   Case discussed with Dr. Delice Lesch who agrees with the plan   Subjective:    Eric Richmond is a very pleasant 81 y.o. RH male with a history of hypertension, sleep apnea on CPAP, mild dementia likely due to Alzheimer's disease seen today in follow up for memory loss. This patient is accompanied in the office by his wife who supplements the history.  Previous records as well as any outside records available were reviewed prior to todays visit.  Patient was last seen at our office on 10/05/2020 at which time his MMSE was 18/30.  MRI of the brain showed diffuse atrophy.  He is on memantine 10 mg twice daily.  For mood, he is on Lexapro 20 mg daily, tolerating well. 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    He feels his memory is "so-so."  He is wife  reports that he forgets more than before, and he repeats himself several times, having more difficulty following instructions.  "If I explain something to him, 1 minute later I have to tell them ".  She also reports that he has more difficulty operating the microwave oven or remote control.  He is wife manages the finances and medications, and although he passed the driving evaluation in 2021, due to dizziness, which sounds more orthostatic, as he reports his dizziness when standing up but not when sitting.  Of note, he is to have an appointment soon with his PCP due to low blood pressure.  No recent falls or head injuries.  No vertigo he tried vestibular therapy in the past, without improvement.  He no longer uses scopolamine patch if needed, due to confusion and hallucinations with the medicine.Marland Kitchen  He does admit to not drinking enough water.  He does not drive.  He is independent with dressing and bathing.  He does report vivid dreams, and his wife states that he fights in his dreams, and at times he kicks her.  Because of that, at night he takes his the CPAP off "the machine is not working ".  There are no further hallucinations, or paranoia.  His mood is "happy ", although his wife notes increased irritability.  No tremors are reported.  He denies any headaches, focal numbness or weakness.  No urine or bowel complaints.  History on Initial Assessment 04/25/2018: This is a very pleasant 81 year old right-handed man with a history of hypertension, sleep apnea on CPAP, presenting for evaluation of worsening memory. He states his memory is not as good as it had been. His wife started noticing memory changes around 6 months ago, noting that he is very forgetful. He forgets where he puts things or does not remember the meal he ate prior. She feels his forgetfulness is just more than usual. He occasionally repeats himself. They both agree that he is good with managing his own medications, he rarely forgets  them. 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 He denies any difficulties with driving and work. He got lost briefly a week ago in an unfamiliar road, he got "a little excited" but was able to find his way back within 5 minutes. His wife denies any other driving concerns. Wife manages finances. He is independent with dressing and bathing. His wife has noticed he is a little more stressed out and "gets really tight" when he cannot find something. No paranoia or hallucinations. Sleep is good with CPAP, no wandering behavior.   He has frontal headaches when using a machine with smoke coming out. He has occasional dizziness when standing. He has occasional back pain from a pinched nerve. He has had a decreased sense of smell for some time. No diplopia, dysarthria/dysphagia, neck pain, focal numbness/tingling/weakness, bowel/bladder dysfunction, tremors. His father had memory issues. He occasionally drinks a little wine. No significant head injuries.  Denies headaches, double vision, dizziness, focal numbness or tingling, unilateral weakness, tremors or anosmia. No history of seizures. Denies urine incontinence, retention, constipation or diarrhea.   PREVIOUS MEDICATIONS:   CURRENT MEDICATIONS:  Outpatient Encounter Medications as of 81/11/2021  Medication Sig   albuterol (VENTOLIN HFA) 108 (90 Base) MCG/ACT inhaler Inhale 2 puffs into the lungs every 6 (six) hours as needed for wheezing or shortness of breath.   amLODipine (NORVASC) 10 MG tablet Take 1 tablet (10 mg total) by mouth daily.   ascorbic acid (VITAMIN C) 500 MG tablet Take 1 tablet (500 mg total) by mouth daily.   aspirin EC 81 MG tablet Take 81 mg by mouth daily.   diazepam (VALIUM) 2 MG tablet Take 1 tablet (2 mg total) by mouth every 8 (eight) hours as needed (dizziness).   divalproex (DEPAKOTE) 125 MG DR tablet Take 1 tablet (125 mg total) by mouth 3 (three) times daily.   escitalopram  (LEXAPRO) 20 MG tablet Take 1 tablet (20 mg total) by mouth daily.   fluticasone (FLONASE) 50 MCG/ACT nasal spray Place 2 sprays into both nostrils daily.   losartan-hydrochlorothiazide (HYZAAR) 100-25 MG tablet Take 1 tablet by mouth every morning.   memantine (NAMENDA) 10 MG tablet Take 1 tablet (10 mg total) by mouth 2 (two) times daily.   Multiple Vitamin (MULTIVITAMIN WITH MINERALS) TABS tablet Take 1 tablet by mouth daily.   pantoprazole (PROTONIX) 40 MG tablet Take 1 tablet (40 mg total) by mouth daily.   [DISCONTINUED] atenolol (TENORMIN) 50 MG tablet TAKE 1 TABLET EVERY DAY (NEED MD APPOINTMENT FOR REFILLS) (Patient taking differently: Take 50 mg by mouth daily. )   No facility-administered encounter medications on file as of 81/11/2021.     Objective:     PHYSICAL EXAMINATION:    VITALS:   Vitals:   04/07/21 1059  BP: 99/65  Pulse: 71  Resp: 20  SpO2: 96%  Weight: 189 lb (85.7 kg)  Height: 5\' 8"  (1.727 m)    GEN:  The patient appears stated age and is in NAD. HEENT:  Normocephalic, atraumatic.   Neurological examination:  General: NAD, well-groomed, appears stated age. Orientation: The patient is alert. Oriented to person, place and date Cranial nerves: There is good facial symmetry.The speech is fluent and clear. No aphasia or dysarthria. Fund of knowledge is appropriate. Recent and remote memory are impaired. Attention and concentration are reduced.  Able to name objects and repeat phrases.  Hearing is intact to conversational tone.    Sensation: Sensation is intact to light touch throughout Motor: Strength is at least antigravity x4. Tremors: none  DTR's 2/4 in [place] Cognitive Assessment  12/02/2018 04/25/2018  Visuospatial/ Executive (0/5) 1 1  Naming (0/3) 2 3  Attention: Read list of digits (0/2) 1 1  Attention: Read list of letters (0/1) 1 1  Attention: Serial 7 subtraction starting at 100 (0/3) 2 1  Language: Repeat phrase (0/2) 1 1   Language : Fluency (0/1) 0 0  Abstraction (0/2) 0 0  Delayed Recall (0/5) 0 0  Orientation (0/6) 4 2  Total 12 10  Adjusted Score (based on education) 13 11   MMSE - Mini Mental State Exam 04/07/2021 10/05/2020 08/13/2017  Orientation to time 0 3 4  Orientation to time comments - - -  Orientation to Place 2 5 5   Registration 3 3 3   Attention/ Calculation 0 2 4  Recall 3 0 2  Language- name 2 objects 2 2 2   Language- repeat 0 1 1  Language- follow 3 step command 3 1 3   Language- read & follow direction 1 1 1   Write a sentence 0 0 1  Copy design 0 0 0  Total score 14 18 26     No flowsheet data found.     Movement examination: Tone: There is normal tone in the UE/LE Abnormal movements:  no tremor.  No myoclonus.  No asterixis.   Coordination:  There is no decremation with RAM's. Normal finger to nose  Gait and Station: The patient has no difficulty arising out of a deep-seated chair without the use of the hands. The patient's stride length is good.  Gait is cautious and mildly wide based     Total time spent on today's visit was 30 minutes, including both face-to-face time and nonface-to-face time. Time included that spent on review of records (prior notes available to me/labs/imaging if pertinent), discussing treatment and goals, answering patient's questions and coordinating care.  Cc:  [name], MD Sharene Butters, PA-C

## 2021-04-07 ENCOUNTER — Encounter: Payer: Self-pay | Admitting: Physician Assistant

## 2021-04-07 ENCOUNTER — Other Ambulatory Visit: Payer: Self-pay

## 2021-04-07 ENCOUNTER — Ambulatory Visit: Payer: Medicare Other | Admitting: Physician Assistant

## 2021-04-07 VITALS — BP 99/65 | HR 71 | Resp 20 | Ht 68.0 in | Wt 189.0 lb

## 2021-04-07 DIAGNOSIS — G301 Alzheimer's disease with late onset: Secondary | ICD-10-CM

## 2021-04-07 DIAGNOSIS — F02818 Dementia in other diseases classified elsewhere, unspecified severity, with other behavioral disturbance: Secondary | ICD-10-CM | POA: Diagnosis not present

## 2021-04-07 MED ORDER — DIVALPROEX SODIUM 125 MG PO DR TAB: 125.0000 mg | DELAYED_RELEASE_TABLET | Freq: Three times a day (TID) | ORAL | 11 refills | Status: DC

## 2021-04-07 NOTE — Patient Instructions (Addendum)
Increase Lexapro to 20mg  daily  2. Continue Memantine 10mg  twice a day  Start Depakote 125 mg nightly   4. Try the head movement exercises for dizziness, also check BP when you feel dizzy upon standing  Clinicaltrials.gov  Monitor driving   5. Follow-up in 6 months, call for any changes   FALL PRECAUTIONS: Be cautious when walking. Scan the area for obstacles that may increase the risk of trips and falls. When getting up in the mornings, sit up at the edge of the bed for a few minutes before getting out of bed. Consider elevating the bed at the head end to avoid drop of blood pressure when getting up. Walk always in a well-lit room (use night lights in the walls). Avoid area rugs or power cords from appliances in the middle of the walkways. Use a walker or a cane if necessary and consider physical therapy for balance exercise. Get your eyesight checked regularly.   HOME SAFETY: Consider the safety of the kitchen when operating appliances like stoves, microwave oven, and blender. Consider having supervision and share cooking responsibilities until no longer able to participate in those. Accidents with firearms and other hazards in the house should be identified and addressed as well.  DRIVING: Regarding driving, in patients with progressive memory problems, driving will be impaired. We advise to have someone else do the driving if trouble finding directions or if minor accidents are reported. Independent driving assessment is available to determine safety of driving.  ABILITY TO BE LEFT ALONE: If patient is unable to contact 911 operator, consider using LifeLine, or when the need is there, arrange for someone to stay with patients. Smoking is a fire hazard, consider supervision or cessation. Risk of wandering should be assessed by caregiver and if detected at any point, supervision and safe proof recommendations should be instituted.   RECOMMENDATIONS FOR ALL PATIENTS WITH MEMORY  PROBLEMS: 1. Continue to exercise (Recommend 30 minutes of walking everyday, or 3 hours every week) 2. Increase social interactions - continue going to [place] and enjoy social gatherings with friends and family 3. Eat healthy, avoid fried foods and eat more fruits and vegetables 4. Maintain adequate blood pressure, blood sugar, and blood cholesterol level. Reducing the risk of stroke and cardiovascular disease also helps promoting better memory. 5. Avoid stressful situations. Live a simple life and avoid aggravations. Organize your time and prepare for the next day in anticipation. 6. Sleep well, avoid any interruptions of sleep and avoid any distractions in the bedroom that may interfere with adequate sleep quality 7. Avoid sugar, avoid sweets as there is a strong link between excessive sugar intake, diabetes, and cognitive impairment The Mediterranean diet has been shown to help patients reduce the risk of progressive memory disorders and reduces cardiovascular risk. This includes eating fish, eat fruits and green leafy vegetables, nuts like almonds and hazelnuts, walnuts, and also use olive oil. Avoid fast foods and fried foods as much as possible. Avoid sweets and sugar as sugar use has been linked to worsening of memory function.  There is always a concern of gradual progression of memory problems. If this is the case, then we may need to adjust level of care according to patient needs. Support, both to the patient and caregiver, should then be put into place.

## 2021-04-09 DIAGNOSIS — F02818 Dementia in other diseases classified elsewhere, unspecified severity, with other behavioral disturbance: Secondary | ICD-10-CM | POA: Insufficient documentation

## 2021-04-09 DIAGNOSIS — G301 Alzheimer's disease with late onset: Secondary | ICD-10-CM | POA: Insufficient documentation

## 2021-04-10 ENCOUNTER — Other Ambulatory Visit: Payer: Self-pay

## 2021-04-10 ENCOUNTER — Ambulatory Visit: Payer: Medicare Other

## 2021-04-10 ENCOUNTER — Encounter: Payer: Self-pay | Admitting: Family Medicine

## 2021-04-10 ENCOUNTER — Ambulatory Visit (INDEPENDENT_AMBULATORY_CARE_PROVIDER_SITE_OTHER): Payer: Medicare Other | Admitting: Licensed Clinical Social Worker

## 2021-04-10 VITALS — BP 108/60 | HR 60 | Temp 98.0°F | Wt 188.0 lb

## 2021-04-10 DIAGNOSIS — I10 Essential (primary) hypertension: Secondary | ICD-10-CM | POA: Diagnosis not present

## 2021-04-10 DIAGNOSIS — G4733 Obstructive sleep apnea (adult) (pediatric): Secondary | ICD-10-CM

## 2021-04-10 MED ORDER — DIAZEPAM 2 MG PO TABS: 2.0000 mg | ORAL_TABLET | Freq: Three times a day (TID) | ORAL | 5 refills | Status: DC | PRN

## 2021-04-10 MED ORDER — AMLODIPINE BESYLATE 10 MG PO TABS: 5.0000 mg | ORAL_TABLET | Freq: Every day | ORAL | 3 refills | Status: DC

## 2021-04-10 NOTE — Progress Notes (Signed)
° °  Subjective:    Patient ID: Eric Richmond, male    DOB: [birth year], 81 y.o.   MRN: [id number]  HPI Here with his wife fro low BP. Over the past month or so, his BP has been running 100-110/50-60. He feels fine except for his usual intermittent dizziness. No other medication changes. Neurology is stating him on Depakote, but he has not started taking it yet.    Review of Systems  Constitutional: Negative.   Respiratory: Negative.    Cardiovascular: Negative.   Neurological:  Positive for dizziness.      Objective:   Physical Exam Constitutional:      Appearance: Normal appearance.  Cardiovascular:     Rate and Rhythm: Normal rate and regular rhythm.     Pulses: Normal pulses.     Heart sounds: Normal heart sounds.  Pulmonary:     Effort: Pulmonary effort is normal.     Breath sounds: Normal breath sounds.  Neurological:     Mental Status: He is alert.          Assessment & Plan:  Low BP in a patient treated for HTN. We will decrease the Amlodipine to 1/2 tablet (5 mg) daily. They will report back in 2 weeks. Alysia Penna, MD

## 2021-04-11 ENCOUNTER — Ambulatory Visit: Payer: Medicare Other

## 2021-04-11 DIAGNOSIS — G4733 Obstructive sleep apnea (adult) (pediatric): Secondary | ICD-10-CM

## 2021-04-12 ENCOUNTER — Telehealth: Payer: Self-pay | Admitting: Pulmonary Disease

## 2021-04-12 DIAGNOSIS — G4733 Obstructive sleep apnea (adult) (pediatric): Secondary | ICD-10-CM | POA: Diagnosis not present

## 2021-04-12 NOTE — Telephone Encounter (Signed)
Called and spoke to patients wife ok per dpr and went over results she voiced understanding and appt scheduled for Friday 04/14/2021 with [facility] NP

## 2021-04-12 NOTE — Telephone Encounter (Signed)
HST 04/11/21 >> AHI 19, SpO2 low 78%.  Spent 458.5 min with SpO2 low < 88%.  Please inform him that his sleep study shows moderate obstructive sleep apnea.  Please arrange for ROV with me or NP to discuss treatment options.

## 2021-04-14 ENCOUNTER — Ambulatory Visit: Payer: Medicare Other | Admitting: Nurse Practitioner

## 2021-04-14 ENCOUNTER — Other Ambulatory Visit: Payer: Self-pay

## 2021-04-14 ENCOUNTER — Encounter: Payer: Self-pay | Admitting: Nurse Practitioner

## 2021-04-14 VITALS — BP 110/76 | HR 60 | Temp 97.9°F | Ht 68.0 in | Wt 188.0 lb

## 2021-04-14 DIAGNOSIS — G4733 Obstructive sleep apnea (adult) (pediatric): Secondary | ICD-10-CM | POA: Diagnosis not present

## 2021-04-14 DIAGNOSIS — F02818 Dementia in other diseases classified elsewhere, unspecified severity, with other behavioral disturbance: Secondary | ICD-10-CM

## 2021-04-14 DIAGNOSIS — G301 Alzheimer's disease with late onset: Secondary | ICD-10-CM | POA: Diagnosis not present

## 2021-04-14 NOTE — Assessment & Plan Note (Signed)
Experiencing tolerance issues with CPAP and frequent removal throughout the night due to cog changes.

## 2021-04-14 NOTE — Progress Notes (Signed)
@Patient  ID: Eric Richmond, male    DOB: 1940/05/20, 81 y.o.   MRN: [id number]  Chief Complaint  Patient presents with   Follow-up    He wants to go over HST    Referring provider: [name], MD  HPI: 81 year old male, former smoker (20 year hx) followed for obstructive sleep apnea on CPAP. He is a patient of Dr. Gustavus Bryant and last seen in office on 11/29/2020. Past medical history significant for HTN, GERD, DM, Alzheimer's, seasonal allergies, vertigo.  TEST/EVENTS:  2005 NPSG: AHI 66/hr - severe OSA 04/11/2021 HST: AHI 19/hr, SpO2 low 78%, >458.5 min spent below 88%. Moderate OSA with sleep related hypoxia/hypoventilation   11/29/2020: OV with Dr. Melvyn Novas. Doing well. Needed new equipment. Plans for HST with f/u afterwards.   04/14/2021: Today - follow up after HST Patient presents today with wife to review HST results which showed moderate OSA and significant amount of time spent below 88%. Breathing is stable. He has early stages of Alzheimer's and his wife reports that he is having issues with tolerating his CPAP machine and leaving it on at night. She says he takes it off multiple times a night and she has to reapply it for him. She also said he is taking it off between 3-4 AM every night and then going back to sleep and will not put it back on. They both say that his machine is old and he feels like there are some issues with the humidification portion. They have taken it for troubleshooting but were told there was nothing to be done. He goes to bed around 9-10 PM. He denies any daytime fatigue symptoms. He no longer drives.   [facility] Download: CPAP 11 cmH2O 27/30 days used, 83% >4 hours AV usage 7 hr 18 min Leaks 18.3 L/min, 95th 50.2 L/min AHI 2.3/hr   Allergies  Allergen Reactions   Oxycodone Other (See Comments)    Hallucinations    Tramadol Other (See Comments)    Hallucinations     Immunization History  Administered Date(s) Administered   Fluad Quad(high Dose 65+)  12/15/2018   Influenza Split 01/10/2013, 11/18/2019   Influenza Whole 11/21/2009, 01/17/2010   Influenza, High Dose Seasonal PF 11/18/2014, 12/30/2015, 12/12/2016   Influenza,inj,Quad PF,6+ Mos 12/15/2013   Influenza-Unspecified 12/05/2020   Pneumococcal Conjugate-13 12/15/2013   Pneumococcal Polysaccharide-23 06/02/2015   Tdap 12/12/2017, 12/16/2017    Past Medical History:  Diagnosis Date   Allergy    Anal fissure 20 years ago   COVID-19 01/2019   Diabetes mellitus without complication ([place]) 04/19/9796   ED (erectile dysfunction)    Essential hypertension 10/22/2006   Qualifier: Diagnosis of  By: Sarajane Jews MD, Ishmael Holter    GERD (gastroesophageal reflux disease)    Hard of hearing    Mild neurocognitive disorder 01/06/2019   OSA (obstructive sleep apnea)    per Dr. Gwenette Greet , cpap -2.0   Osteoarthritis    Right knee and right thumb; s/p total knee replacement   Pneumonia 2015    Tobacco History: Social History   Tobacco Use  Smoking Status Former   Packs/day: 0.30   Years: 20.00   Pack years: 6.00   Types: Cigarettes   Quit date: 02/26/1977   Years since quitting: 44.1  Smokeless Tobacco Former   Types: Chew   Counseling given: Not Answered   Outpatient Medications Prior to Visit  Medication Sig Dispense Refill   albuterol (VENTOLIN HFA) 108 (90 Base) MCG/ACT inhaler Inhale 2 puffs  into the lungs every 6 (six) hours as needed for wheezing or shortness of breath. 1 Inhaler 11   amLODipine (NORVASC) 10 MG tablet Take 0.5 tablets (5 mg total) by mouth daily. 90 tablet 3   ascorbic acid (VITAMIN C) 500 MG tablet Take 1 tablet (500 mg total) by mouth daily. 6 tablet 0   aspirin EC 81 MG tablet Take 81 mg by mouth daily.     diazepam (VALIUM) 2 MG tablet Take 1 tablet (2 mg total) by mouth every 8 (eight) hours as needed (dizziness). 90 tablet 5   divalproex (DEPAKOTE) 125 MG DR tablet Take 1 tablet (125 mg total) by mouth 3 (three) times daily. 30 tablet 11   escitalopram  (LEXAPRO) 20 MG tablet Take 1 tablet (20 mg total) by mouth daily. 90 tablet 3   fluticasone (FLONASE) 50 MCG/ACT nasal spray Place 2 sprays into both nostrils daily. 48 g 3   losartan-hydrochlorothiazide (HYZAAR) 100-25 MG tablet Take 1 tablet by mouth every morning. 90 tablet 3   memantine (NAMENDA) 10 MG tablet Take 1 tablet (10 mg total) by mouth 2 (two) times daily. 180 tablet 3   Multiple Vitamin (MULTIVITAMIN WITH MINERALS) TABS tablet Take 1 tablet by mouth daily.     pantoprazole (PROTONIX) 40 MG tablet Take 1 tablet (40 mg total) by mouth daily. 90 tablet 3   No facility-administered medications prior to visit.     Review of Systems:   Constitutional: No weight loss or gain, night sweats, fevers, chills, fatigue, or lassitude. HEENT: No headaches, difficulty swallowing, tooth/dental problems, or sore throat. No sneezing, itching, ear ache, nasal congestion, or post nasal drip CV:  No chest pain, orthopnea, PND, swelling in lower extremities, anasarca, dizziness, palpitations, syncope Resp: No shortness of breath with exertion or at rest. No excess mucus or change in color of mucus. No productive or non-productive. No hemoptysis. No wheezing.  No chest wall deformity GI:  No heartburn, indigestion, abdominal pain, nausea, vomiting, diarrhea, change in bowel habits, loss of appetite, bloody stools.  GU: No dysuria, change in color of urine, urgency or frequency.  No flank pain, no hematuria  Skin: No rash, lesions, ulcerations MSK:  No joint pain or swelling.  No decreased range of motion.  No back pain. Neuro: No dizziness or lightheadedness. Forgetfulness, confusion at times Psych: No depression or anxiety. Mood stable.     Physical Exam:  BP 110/76 (BP Location: Left Arm, Patient Position: Sitting, Cuff Size: Normal)    Pulse 60    Temp 97.9 F (36.6 C) (Oral)    Ht 5\' 8"  (1.727 m)    Wt 188 lb (85.3 kg)    SpO2 97%    BMI 28.59 kg/m   GEN: Pleasant, interactive,  well-nourished; in no acute distress. HEENT:  Normocephalic and atraumatic. EACs patent bilaterally. TM pearly gray with present light reflex bilaterally. PERRLA. Sclera white. Nasal turbinates pink, moist and patent bilaterally. No rhinorrhea present. Oropharynx pink and moist, without exudate or edema. No lesions, ulcerations, or postnasal drip.  NECK:  Supple w/ fair ROM. No JVD present. Normal carotid impulses w/o bruits. Thyroid symmetrical with no goiter or nodules palpated. No lymphadenopathy.   CV: RRR, no m/r/g, no peripheral edema. Pulses intact, +2 bilaterally. No cyanosis, pallor or clubbing. PULMONARY:  Unlabored, regular breathing. Clear bilaterally A&P w/o wheezes/rales/rhonchi. No accessory muscle use. No dullness to percussion. GI: BS present and normoactive. Soft, non-tender to palpation. No organomegaly or masses detected. No CVA tenderness.  MSK: No erythema, warmth or tenderness. Cap refil <2 sec all extrem. No deformities or joint swelling noted.  Neuro: A/Ox3. Remote memory impaired with some decreased attention. Skin: Warm, no lesions or rashe Psych: Normal affect and behavior. Judgement and thought content appropriate.     Lab Results:  CBC    Component Value Date/Time   WBC 5.5 12/09/2020 0829   RBC 4.56 12/09/2020 0829   HGB 13.4 12/09/2020 0829   HCT 39.9 12/09/2020 0829   PLT 141.0 (L) 12/09/2020 0829   MCV 87.4 12/09/2020 0829   MCV 88.5 06/15/2013 2114   MCH 28.9 06/13/2020 1238   MCHC 33.7 12/09/2020 0829   RDW 14.1 12/09/2020 0829   LYMPHSABS 2.0 12/09/2020 0829   MONOABS 0.4 12/09/2020 0829   EOSABS 0.2 12/09/2020 0829   BASOSABS 0.0 12/09/2020 0829    BMET    Component Value Date/Time   NA 139 12/09/2020 0829   K 4.1 12/09/2020 0829   CL 101 12/09/2020 0829   CO2 30 12/09/2020 0829   GLUCOSE 99 12/09/2020 0829   BUN 30 (H) 12/09/2020 0829   CREATININE 1.25 12/09/2020 0829   CALCIUM 9.2 12/09/2020 0829   GFRNONAA >60 06/13/2020 1238    GFRAA >60 05/02/2019 1428    BNP No results found for: BNP   Imaging:  No results found.    No flowsheet data found.  No results found for: NITRICOXIDE      Assessment & Plan:   OSA (obstructive sleep apnea) Recent HST with AHI 19 indicating moderate OSA and significant time spent <88%. Discussed CPAP titration study; however, wife requested information on Inspire device. Pt having difficulties tolerating the machine and wakes multiple times a night to take it off; wife has to wake and reapply the device. Download shows appropriate average total use; however, leakage high, consistent with frequent removal. Referral placed to ENT for further eval for Inspire-wife is aware she would be responsible for helping manage device at home. Order sent for new CPAP machine. ONO on CPAP ordered.   Patient Instructions  Continue to use CPAP 11 cmH2O every night, minimum of 4-6 hours a night.  Change equipment every 30 days or as directed by DME. Wash your tubing with warm soap and water daily, hang to dry. Wash humidifier portion weekly.  Maintain clean equipment, as directed by home health agency.  Be aware of reduced alertness and do not drive or operate heavy machinery if experiencing this or drowsiness.  Exercise encouraged, as tolerated. Healthy weight management discussed.  Avoid or decrease alcohol consumption and medications that make you more sleepy, if possible. Notify if persistent daytime sleepiness occurs even with consistent use of CPAP.   Order sent to DME for new machine  Overnight oximetry ordered on OSA   Referral to ENT for evaluation for Inspire device  Follow up in 3 months with Dr. Melvyn Novas or Alanson Aly If symptoms do not improve or worsen, please contact office for sooner follow up or seek emergency care.    Late onset Alzheimer's dementia with behavioral disturbance ([place]) Experiencing tolerance issues with CPAP and frequent removal throughout the night due to  cog changes.   Clayton Bibles, NP 04/14/2021  Pt aware and understands NP's role.

## 2021-04-14 NOTE — Assessment & Plan Note (Addendum)
Recent HST with AHI 19 indicating moderate OSA and significant time spent <88%. Discussed CPAP titration study; however, wife requested information on Inspire device. Pt having difficulties tolerating the machine and wakes multiple times a night to take it off; wife has to wake and reapply the device. Download shows appropriate average total use; however, leakage high, consistent with frequent removal. Referral placed to ENT for further eval for Inspire-wife is aware she would be responsible for helping manage device at home. Order sent for new CPAP machine. ONO on CPAP ordered.   Patient Instructions  Continue to use CPAP 11 cmH2O every night, minimum of 4-6 hours a night.  Change equipment every 30 days or as directed by DME. Wash your tubing with warm soap and water daily, hang to dry. Wash humidifier portion weekly.  Maintain clean equipment, as directed by home health agency.  Be aware of reduced alertness and do not drive or operate heavy machinery if experiencing this or drowsiness.  Exercise encouraged, as tolerated. Healthy weight management discussed.  Avoid or decrease alcohol consumption and medications that make you more sleepy, if possible. Notify if persistent daytime sleepiness occurs even with consistent use of CPAP.   Order sent to DME for new machine  Overnight oximetry ordered on OSA   Referral to ENT for evaluation for Inspire device  Follow up in 3 months with Dr. Melvyn Novas or Alanson Aly If symptoms do not improve or worsen, please contact office for sooner follow up or seek emergency care.

## 2021-04-14 NOTE — Patient Instructions (Addendum)
Continue to use CPAP 11 cmH2O every night, minimum of 4-6 hours a night.  Change equipment every 30 days or as directed by DME. Wash your tubing with warm soap and water daily, hang to dry. Wash humidifier portion weekly.  Maintain clean equipment, as directed by home health agency.  Be aware of reduced alertness and do not drive or operate heavy machinery if experiencing this or drowsiness.  Exercise encouraged, as tolerated. Healthy weight management discussed.  Avoid or decrease alcohol consumption and medications that make you more sleepy, if possible. Notify if persistent daytime sleepiness occurs even with consistent use of CPAP.   Order sent to DME for new machine  Overnight oximetry ordered on OSA   Referral to ENT for evaluation for Inspire device  Follow up in 3 months with Dr. Melvyn Novas or Alanson Aly If symptoms do not improve or worsen, please contact office for sooner follow up or seek emergency care.

## 2021-04-17 ENCOUNTER — Telehealth: Payer: Self-pay | Admitting: Nurse Practitioner

## 2021-04-17 NOTE — Telephone Encounter (Signed)
They can take it to their DME company for troubleshooting while they wait on the new machine. He can also use saline nasal gel in each nostril before he applies his CPAP at bedtime.

## 2021-04-17 NOTE — Telephone Encounter (Signed)
Called patient's wife but she did not answer. Left message for her to call back.  ?

## 2021-04-17 NOTE — Telephone Encounter (Signed)
Called and spoke with patient's wife Eric Richmond. He stated that the patient's cpap machine has been acting strange for the past few nights. She has putting water into the humidifier each night before the patient goes to sleep. He has been waking up with a dry mouth and humidifier reservoir is dry. She also stated that the machine feels hotter than usual.   I made her aware that a new cpap machine had been ordered on Friday.   She wanted to know if she should be doing anything else for the cpap machine.   Joellen Jersey, can you please advise? Thanks!

## 2021-04-17 NOTE — Progress Notes (Signed)
Reviewed and agree with assessment/plan. ° ° °Kourtney Montesinos, MD °[facility] °04/17/2021, 5:05 PM °Pager:  [phone] ° °

## 2021-04-19 DIAGNOSIS — H353111 Nonexudative age-related macular degeneration, right eye, early dry stage: Secondary | ICD-10-CM | POA: Diagnosis not present

## 2021-04-19 DIAGNOSIS — H40023 Open angle with borderline findings, high risk, bilateral: Secondary | ICD-10-CM | POA: Diagnosis not present

## 2021-04-19 DIAGNOSIS — Z961 Presence of intraocular lens: Secondary | ICD-10-CM | POA: Diagnosis not present

## 2021-04-20 DIAGNOSIS — R0683 Snoring: Secondary | ICD-10-CM | POA: Diagnosis not present

## 2021-04-20 DIAGNOSIS — G4730 Sleep apnea, unspecified: Secondary | ICD-10-CM | POA: Diagnosis not present

## 2021-05-17 ENCOUNTER — Other Ambulatory Visit: Payer: Self-pay | Admitting: *Deleted

## 2021-05-17 NOTE — Patient Outreach (Signed)
[facility]) Care Management ? ?05/17/2021 ? ?Elis L Dross ?08-27-1940 ?[id number] ? ?Successful telephone outreach call to patient's wife [name] in response to a VM she left for this nurse to call her back. HIPAA identifiers obtained. Wife shared that the patient has been sleeping a lot during the day for the last several weeks. [name] states that the patient is easy to arouse, denies any slurred speech or facial droop, and reports that his B/P ranges at home have been in the systolic 332-951'O and diastolic 84-16'S. Spouse explains that the patient's Alzheimer's has become worse but the sleepiness is something that occurred fairly abruptly. Nurse reviewed the patient's medication with [name] and explained that he has several medications that can cause sleepiness. Nurse discussed that the patient was recently prescribed Depakote and his Lexapro was increased during his neurology visit on 04/07/21 which may be causing the patient to feel sleepy. Nurse advised [name] to call the patient's  neurology office to inform them that the patient's day sleepiness began shortly after these medication changes. Spouse verbalized understanding and was appreciative for this nurse's callback. Wife did confirm that she has this nurse's contact number to call her if needed.  ? ?Plan: RN Health Coach will call patient within the month of April. ? ?[name] RN, BSN ?[facility] Care Management  ?RN Health Coach ?[phone] ?[email or web address] ? ?

## 2021-05-29 ENCOUNTER — Telehealth: Payer: Self-pay | Admitting: Family Medicine

## 2021-05-29 NOTE — Telephone Encounter (Signed)
Patient spouse called in stating that patient has been without BP medication for 2 days. She's requesting for Dr.Fry to send losartan-hydrochlorothiazide (HYZAAR) 100-25 MG tablet [[id number]]  to [facility] ([place], [place]  ?[address], [place] [address]. ? ?Patient spouse Estill Batten is also requesting a phone call back to discuss patients health. ? ?Please advise. ?

## 2021-05-29 NOTE — Telephone Encounter (Signed)
Pt is scheduled for OV with Dr Sarajane Jews on 05/30/2021 ?Rx not sent , Dr Sarajane Jews will review at the visit ?

## 2021-05-30 ENCOUNTER — Ambulatory Visit (INDEPENDENT_AMBULATORY_CARE_PROVIDER_SITE_OTHER): Payer: Medicare Other | Admitting: Family Medicine

## 2021-05-30 ENCOUNTER — Encounter: Payer: Self-pay | Admitting: Family Medicine

## 2021-05-30 VITALS — BP 96/60 | HR 61 | Temp 99.0°F | Wt 183.0 lb

## 2021-05-30 DIAGNOSIS — I10 Essential (primary) hypertension: Secondary | ICD-10-CM

## 2021-05-30 MED ORDER — LOSARTAN POTASSIUM-HCTZ 50-12.5 MG PO TABS: 1.0000 | ORAL_TABLET | Freq: Every evening | ORAL | 2 refills | Status: DC

## 2021-05-30 NOTE — Progress Notes (Signed)
? ?  Subjective:  ? ? Patient ID: Eric Richmond, male    DOB: 1940-03-27, 81 y.o.   MRN: [id number] ? ?HPI ?Here with his wife for low BP. His readings at home the past few weeks average in the 90s or 100s over 60s. He often feels lightheaded.  ? ? ?Review of Systems  ?Constitutional: Negative.   ?Respiratory: Negative.    ?Cardiovascular: Negative.   ?Neurological:  Positive for light-headedness.  ? ?   ?Objective:  ? Physical Exam ?Constitutional:   ?   Appearance: Normal appearance.  ?Cardiovascular:  ?   Rate and Rhythm: Normal rate and regular rhythm.  ?   Pulses: Normal pulses.  ?   Heart sounds: Normal heart sounds.  ?Pulmonary:  ?   Effort: Pulmonary effort is normal.  ?   Breath sounds: Normal breath sounds.  ?Neurological:  ?   Mental Status: He is alert. Mental status is at baseline.  ? ? ? ? ? ?   ?Assessment & Plan:  ?HTN. We will decrease the Losartan HCT to 50-12.5 daily. Recheck in 4 weeks.  ?Alysia Penna, MD ? ? ?

## 2021-06-07 NOTE — Telephone Encounter (Signed)
Closing encounter

## 2021-06-09 ENCOUNTER — Other Ambulatory Visit: Payer: Self-pay | Admitting: *Deleted

## 2021-06-09 NOTE — Patient Outreach (Signed)
[facility]) Care Management ? ?06/09/2021 ? ?Eric Richmond ?[birth year] ?[id number] ? ?[facility]) Care Management ?RN Health Coach Note ? ? ?06/09/2021 ?Name:  Eric Richmond MRN:  [id number] DOB:  January 24, 1941 ? ?Summary: ?Wife shared that the patient had an appointment with PCP 05/30/21 due to low B/P. [name] decreased the dosage of losartan and wife reports that the patient's last B/P reading was 117/82. She feels this has helped to increase patient's B/P. Nurse discussed the importance of monitoring B/P daily and contacting PCP for continued low values. Nurse educated about ensuring that the patient stay well hydrated and did send spouse education regarding hypotension. Nurse also sent wife resource information about [facility] medical alert benefit and the Alzheimer's Association. Patient did not have any further questions or concerns today and did confirm that she has this nurse's contact number to call her if needed.  ? ?Recommendations/Changes made from today's visit: ? ?Try to drink at least 3 bottles of water a day ?Monitor B/P daily and record the value ?Alzheimer's Association web site [email or web address] call 24/7 help line [phone]   ?Call Dr. [name] office to see if you need a follow-up appointment to recheck low B/P; bring your recorded B/P values ?[place] benefit number: [phone] ? ?Subjective: ?Eric Richmond is an 81 y.o. year old male who is a primary patient of [name], MD. The care management team was consulted for assistance with care management and/or care coordination needs.   ? ?RN Health Coach completed Telephone Visit today.  ? ?Objective: ? ?Medications Reviewed Today   ? ? Reviewed by Michiel Cowboy, RN (Registered Nurse) on 06/09/21 at 1149  Med List Status: <None>  ? ?Medication Order Taking? Sig Documenting Provider Last Dose Status Informant  ?albuterol (VENTOLIN HFA) 108 (90 Base) MCG/ACT inhaler [id number] Yes Inhale 2 puffs  into the lungs every 6 (six) hours as needed for wheezing or shortness of breath. [name], MD Taking Active   ?         ?Med Note ([place] Nov 29, 2020 11:35 AM)    ?amLODipine (NORVASC) 10 MG tablet [id number] Yes Take 0.5 tablets (5 mg total) by mouth daily. [name], MD Taking Active   ?ascorbic acid (VITAMIN C) 500 MG tablet [id number] Yes Take 1 tablet (500 mg total) by mouth daily. Caren Griffins, MD Taking Active Self  ?aspirin EC 81 MG tablet [id number] Yes Take 81 mg by mouth daily. [provider] Taking Active Self  ? Patient taking differently:   Discontinued 02/23/19 2152 diazepam (VALIUM) 2 MG tablet [id number] Yes Take 1 tablet (2 mg total) by mouth every 8 (eight) hours as needed (dizziness). [name], MD Taking Active   ?divalproex (DEPAKOTE) 125 MG DR tablet [id number] Yes Take 1 tablet (125 mg total) by mouth 3 (three) times daily. Rondel Jumbo, PA-C Taking Active   ?         ?Med Note Laretta Alstrom,  A   Fri Jun 09, 2021 11:43 AM) Wife reports that he is taking 2 times a day  ?escitalopram (LEXAPRO) 20 MG tablet [id number] Yes Take 1 tablet (20 mg total) by mouth daily. [name], MD Taking Active   ?fluticasone Asencion Islam) 50 MCG/ACT nasal spray [id number] Yes Place 2 sprays into both nostrils daily. [name], MD Taking Active Self  ?losartan-hydrochlorothiazide [facility]) 50-12.5 MG tablet [id number] Yes Take 1 tablet by mouth  every evening. [name], MD Taking Active   ?memantine [facility]) 10 MG tablet [id number] Yes Take 1 tablet (10 mg total) by mouth 2 (two) times daily. [name], MD Taking Active   ?Multiple Vitamin (MULTIVITAMIN WITH MINERALS) TABS tablet [id number] Yes Take 1 tablet by mouth daily. [provider] Taking Active Self  ?pantoprazole (PROTONIX) 40 MG tablet [id number] Yes Take 1 tablet (40 mg total) by mouth daily. [name], MD Taking Active   ? ?  ?  ? ?  ? ? ? ?SDOH:  (Social Determinants of Health)  assessments and interventions performed:  ? ? ?Care Plan ? ?Review of patient past medical history, allergies, medications, health status, including review of consultants reports, laboratory and other test data, was performed as part of comprehensive evaluation for care management services.  ? ?Care Plan : RN Care Manager Plan of Care  ?Updates made by Michiel Cowboy, RN since 06/09/2021 12:00 AM  ?  ? ?Problem: Knowledge Deficit Related to Hypertension   ?Priority: High  ?  ? ?Long-Range Goal: Development of Plan of Care for Management of Hypertension   ?Start Date: 03/15/2021  ?Expected End Date: 03/27/2021  ?Priority: High  ?Note:   ?Current Barriers:  ?Chronic Disease Management support and education needs related to HTN  ?Cognitive Deficits Spouse [name] assists patient  ? ?RNCM Clinical Goal(s):  ?Patient will demonstrate Ongoing adherence to prescribed treatment plan for HTN as evidenced by maintaining B/P < 150/90 ?continue to work with RN Care Manager to address care management and care coordination needs related to  HTN as evidenced by adherence to CM Team Scheduled appointments through collaboration with RN Care manager, provider, and care team.  ? ?Interventions: ?Inter-disciplinary care team collaboration (see longitudinal plan of care) ?Evaluation of current treatment plan related to  self management and patient's adherence to plan as established by provider ? ? ? ?Hypertension Interventions:  (Status:  Goal on track:  Yes.) Long Term Goal ?Last practice recorded BP readings:  ?BP Readings from Last 3 Encounters:  ?05/30/21 96/60  ?04/14/21 110/76  ?04/10/21 108/60  ?Most recent eGFR/CrCl: No results found for: EGFR  No components found for: CRCL ? ?Evaluation of current treatment plan related to hypertension self management and patient's adherence to plan as established by provider ?Reviewed medications with patient and discussed importance of compliance ?Discussed plans with patient for ongoing care  management follow up and provided patient with direct contact information for care management team ?Provided education on prescribed diet low sodium, heart healthy diet ?Previously sent hypertension and DASH diet education ?Encouraged wife and patient to go to the [facility] to walk 1-2 times a week ?Encouraged continuation of monitoring B/P daily and recording the values ?Nurse will send hypotension education ? ?Patient Goals/Self-Care Activities: ?Take all medications as prescribed ?Attend all scheduled provider appointments ?Call provider office for new concerns or questions  ?check blood pressure daily ?write blood pressure results in a log or diary ?take blood pressure log to all doctor appointments ?call doctor for signs and symptoms of high blood pressure ?take medications for blood pressure exactly as prescribed ?report new symptoms to your doctor ?Start to go to the [facility] to walk together 1-2 times a week ?Continue to eat a low sodium diet ?Try to drink at least 3 bottles of water a day ?Alzheimer's Association web site [email or web address] call 24/7 help line [phone]   ?Call Neurologist office to ask about Depakote prescription ?Call Dr. [name] office to see if  you need a follow-up appointment to recheck low B/P; bring your recorded B/P values ?[place] benefit number: [phone] ? ? ?Follow Up Plan:  Telephone follow up appointment with care management team member scheduled for:  June ? ?  ?  ? ?Plan: Telephone follow up appointment with care management team member scheduled for:  June. Nurse will send PCP a quarterly update.  ? ?[name] RN, BSN ?Nurse Health Coach ?[place] ?[phone] ?[email or web address] ? ? ? ? ? ? ?

## 2021-06-09 NOTE — Patient Instructions (Signed)
Visit Information ? ?Thank you for taking time to visit with me today. Please don't hesitate to contact me if I can be of assistance to you before our next scheduled telephone appointment. ? ?Following are the goals we discussed today:  ? ?Take all medications as prescribed ?Attend all scheduled provider appointments ?Call provider office for new concerns or questions  ?check blood pressure daily ?write blood pressure results in a log or diary ?take blood pressure log to all doctor appointments ?call doctor for signs and symptoms of high blood pressure ?take medications for blood pressure exactly as prescribed ?report new symptoms to your doctor ?Start to go [facility] to walk together 1-2 times a week ?Continue to eat a low sodium diet ?Try to drink at least 3 bottles of water a day ?Alzheimer's Association web site [email or web address] call 24/7 help line [phone]   ?Call Neurologist office to ask about Depakote prescription ?Call Dr. [name] office to see if you need a follow-up appointment to recheck low B/P; bring your recorded B/P values ?[place] benefit number: [phone] ? ? ?The patient verbalized understanding of instructions, educational materials, and care plan provided today and agreed to receive a mailed copy of patient instructions, educational materials, and care plan.  ? ?Telephone follow up appointment with care management team member scheduled for: June ? ?[name] RN, BSN ?Nurse Health Coach ?[place] ?[phone] ?[email or web address] ? ? ?  ?

## 2021-06-20 IMAGING — MR MR HEAD W/O CM
9 of 11 series · 33 of 48 positions shown · non-contrast
Comparison: Head CT same day

Brain MRI 08/13/2019

CLINICAL DATA: Dizziness

EXAM:
MRI HEAD WITHOUT CONTRAST
TECHNIQUE: Multiplanar, multiecho pulse sequences of the brain and surrounding
structures were obtained without intravenous contrast.

[Series 2: DWI · axial · 3.0mm · 0.94mm/px · z∈[-47,+100]mm · 7 of 99 slices shown (1 of 2)]
[im 1/99]
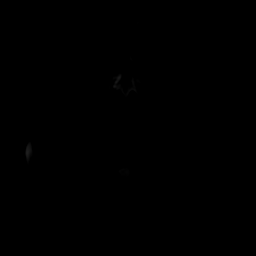
[im 17/99]
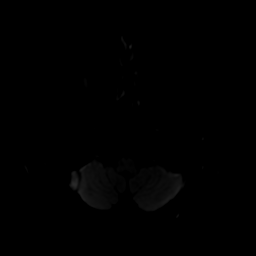
[im 33/99]
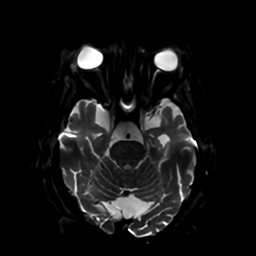
[im 50/99]
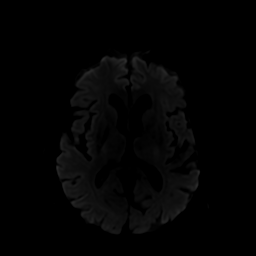
[im 66/99]
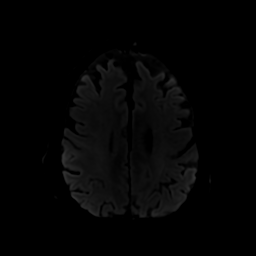
[im 82/99]
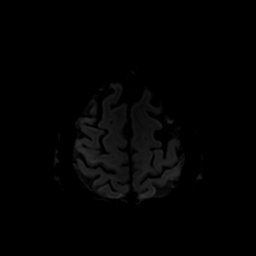
[im 99/99]
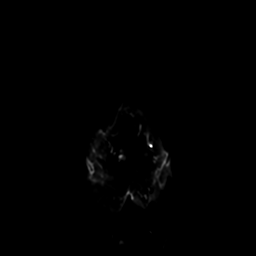

[Series 3: DWI · coronal · 4.0mm · 0.94mm/px · 6 of 74 slices shown (2 of 2)]
[im 1/74]
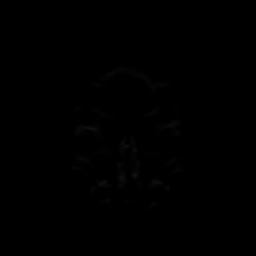
[im 15/74]
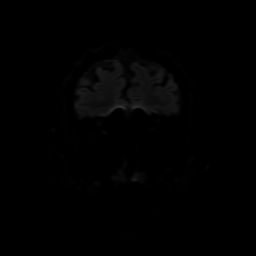
[im 30/74]
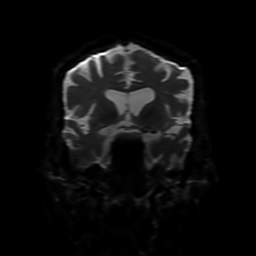
[im 44/74]
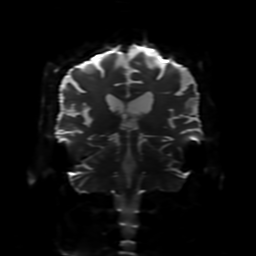
[im 59/74]
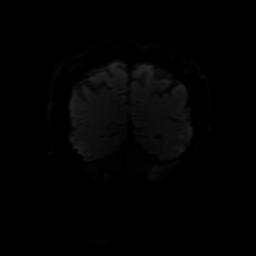
[im 74/74]
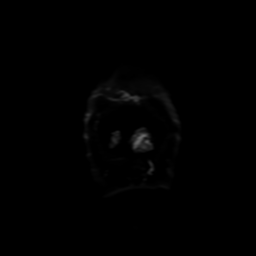

[Series 4: FLAIR · sagittal · 5.0mm · 0.47mm/px · 2 of 25 slices shown (1 of 2)]
[im 1/25]
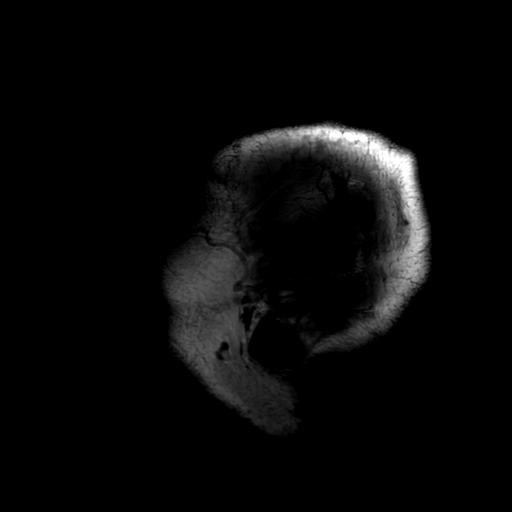
[im 25/25]
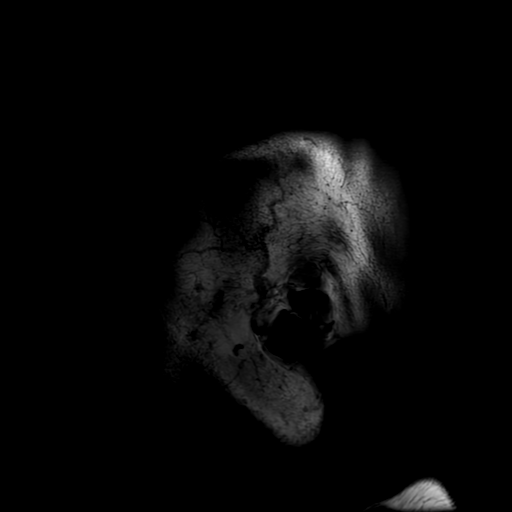

[Series 5: T2 · axial · 5.0mm · 0.47mm/px · z∈[-45,+98]mm · 2 of 25 slices shown (1 of 2)]
[im 1/25]
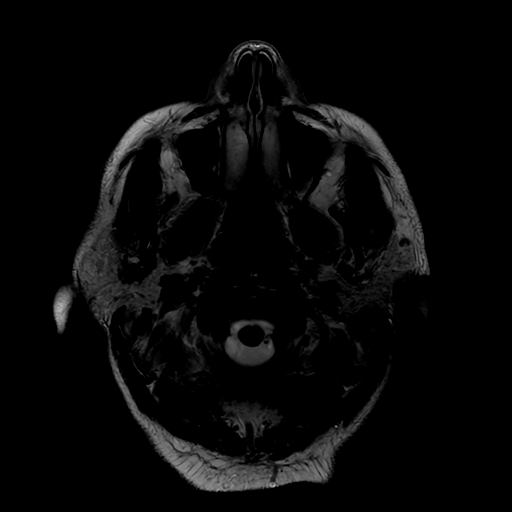
[im 25/25]
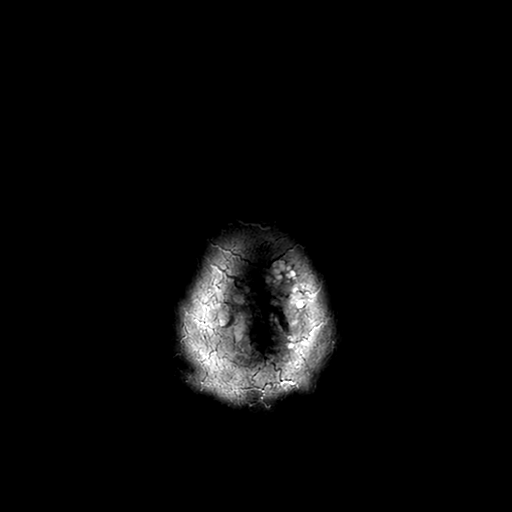

[Series 6: FLAIR · axial · 3.0mm · 0.45mm/px · z∈[-46,+98]mm · 2 of 25 slices shown (2 of 2)]
[im 1/25]
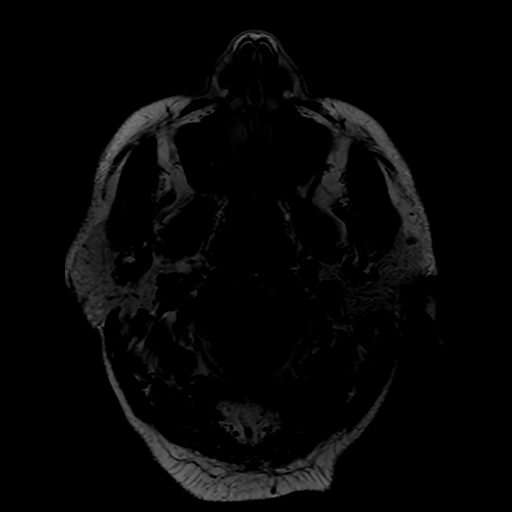
[im 25/25]
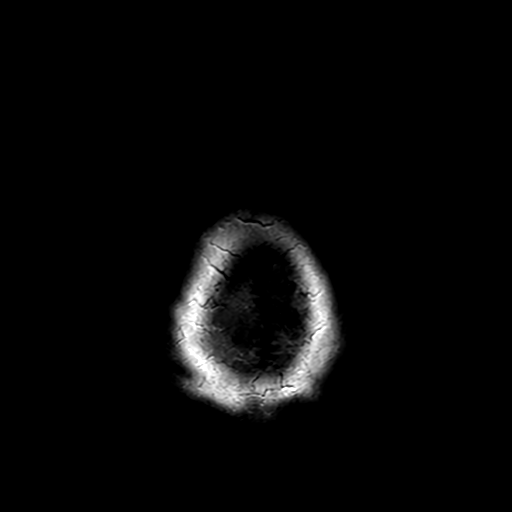

[Series 7: (person_name) · axial · 3.0mm · 0.47mm/px · z∈[-47,+36]mm · 5 of 100 slices shown]
[im 1/100]
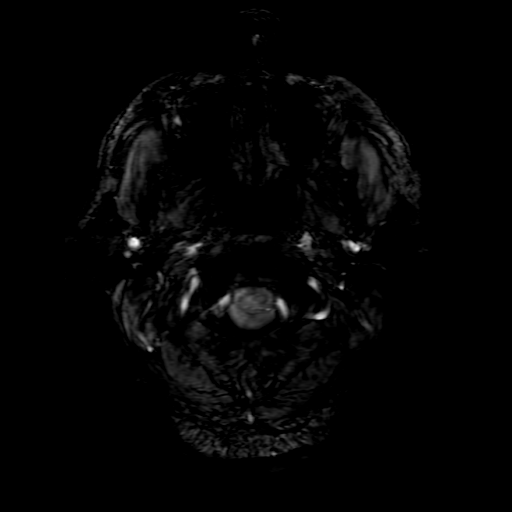
[im 15/100]
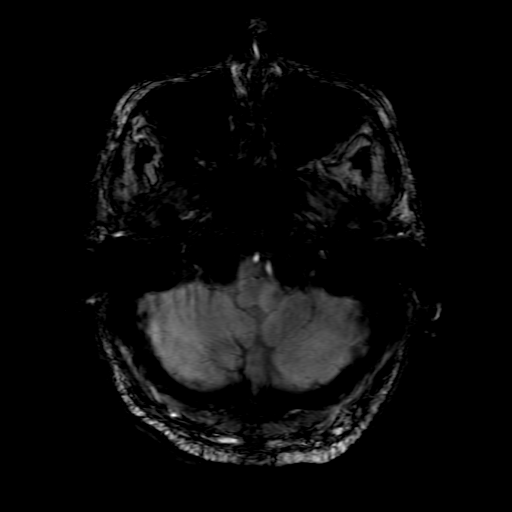
[im 29/100]
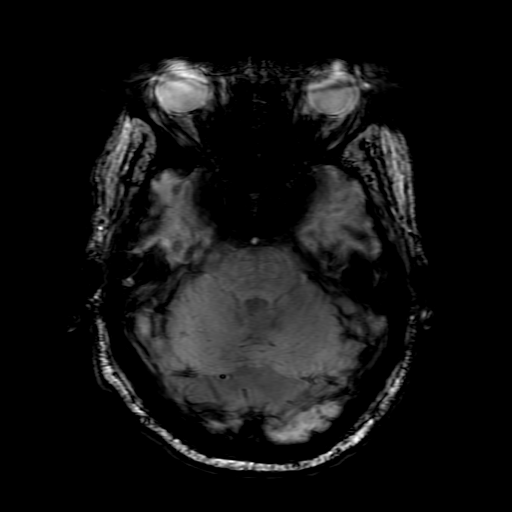
[im 43/100]
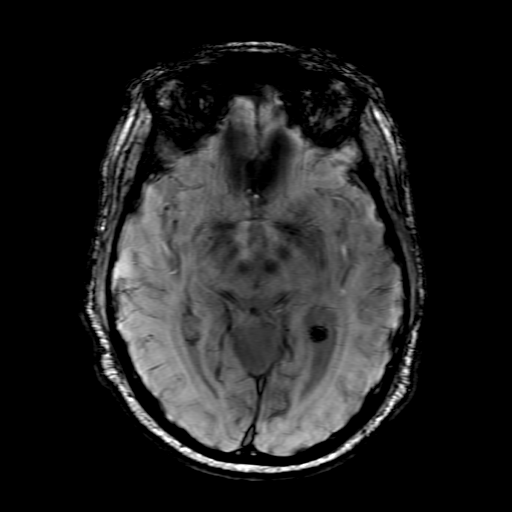
[im 57/100]
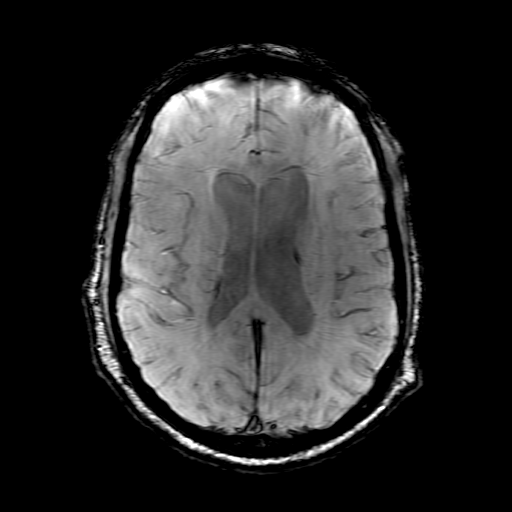

[Series 9: T2 · coronal · 5.0mm · 0.39mm/px · 2 of 31 slices shown (2 of 2)]
[im 1/31]
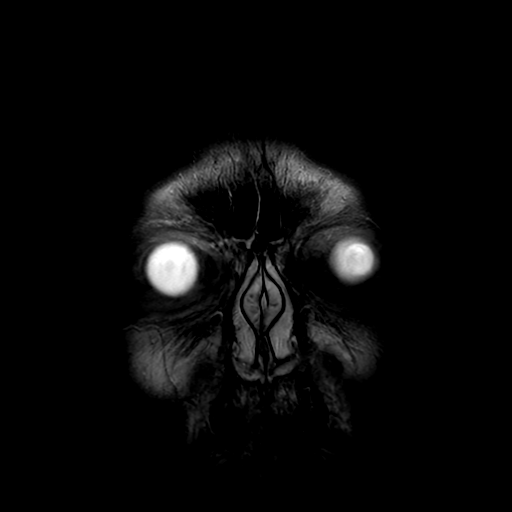
[im 31/31]
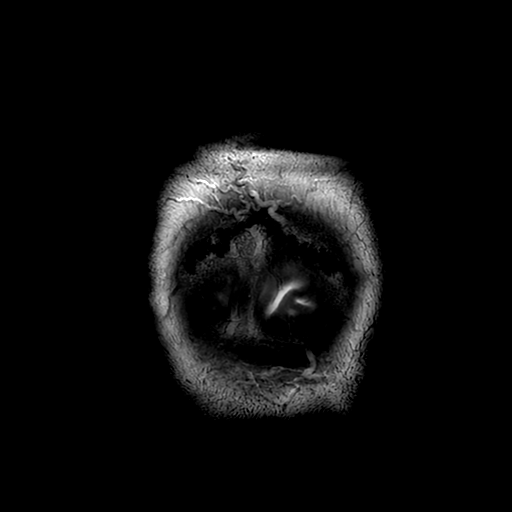

[Series 250: ADC · axial · 3.0mm · 0.94mm/px · z∈[-47,+100]mm · 4 of 50 slices shown (1 of 2)]
[im 1/50]
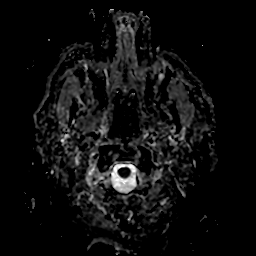
[im 17/50]
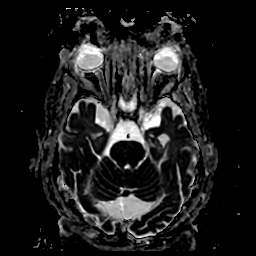
[im 33/50]
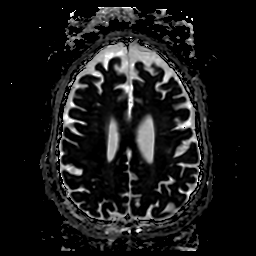
[im 50/50]
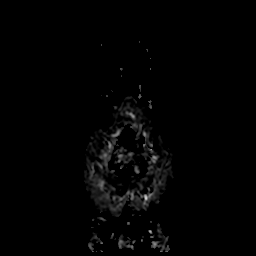

[Series 350: ADC · coronal · 4.0mm · 0.94mm/px · 3 of 37 slices shown (2 of 2)]
[im 1/37]
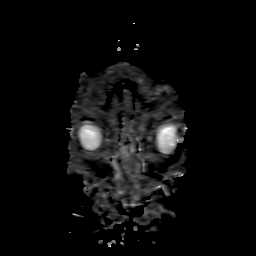
[im 19/37]
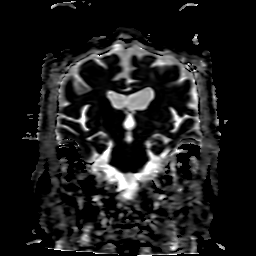
[im 37/37]
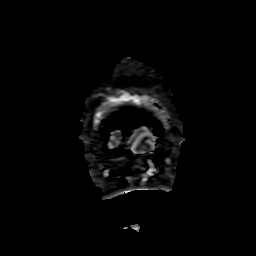

[33 of 48 positions shown; findings below may reference images not displayed]

FINDINGS: Brain: No acute infarct, mass effect or extra-axial collection. No
acute or chronic hemorrhage. Normal white matter signal. Generalized
volume loss without a clear lobar predilection. The midline
structures are normal. Incidentally noted mega cisterna magna.

Vascular: Major flow voids are preserved.

Skull and upper cervical spine: Normal calvarium and skull base.
Visualized upper cervical spine and soft tissues are normal.

Sinuses/Orbits:No paranasal sinus fluid levels or advanced mucosal
thickening. No mastoid or middle ear effusion. Normal orbits.
IMPRESSION: 1. No acute intracranial abnormality.
2. Generalized volume loss without a clear lobar predilection.

## 2021-06-24 ENCOUNTER — Emergency Department (HOSPITAL_COMMUNITY): Payer: Medicare Other

## 2021-06-24 ENCOUNTER — Encounter (HOSPITAL_COMMUNITY): Payer: Self-pay

## 2021-06-24 ENCOUNTER — Emergency Department (HOSPITAL_COMMUNITY)
Admission: EM | Admit: 2021-06-24 | Discharge: 2021-06-25 | Disposition: A | Discharge status: Home / Self Care | Payer: Medicare Other | Attending: Emergency Medicine | Admitting: Emergency Medicine

## 2021-06-24 DIAGNOSIS — E119 Type 2 diabetes mellitus without complications: Secondary | ICD-10-CM | POA: Diagnosis not present

## 2021-06-24 DIAGNOSIS — R6889 Other general symptoms and signs: Secondary | ICD-10-CM | POA: Diagnosis not present

## 2021-06-24 DIAGNOSIS — R42 Dizziness and giddiness: Secondary | ICD-10-CM | POA: Diagnosis not present

## 2021-06-24 DIAGNOSIS — I10 Essential (primary) hypertension: Secondary | ICD-10-CM | POA: Insufficient documentation

## 2021-06-24 DIAGNOSIS — F0390 Unspecified dementia without behavioral disturbance: Secondary | ICD-10-CM | POA: Diagnosis not present

## 2021-06-24 DIAGNOSIS — Z743 Need for continuous supervision: Secondary | ICD-10-CM | POA: Diagnosis not present

## 2021-06-24 DIAGNOSIS — Z79899 Other long term (current) drug therapy: Secondary | ICD-10-CM | POA: Diagnosis not present

## 2021-06-24 DIAGNOSIS — G309 Alzheimer's disease, unspecified: Secondary | ICD-10-CM | POA: Insufficient documentation

## 2021-06-24 DIAGNOSIS — R404 Transient alteration of awareness: Secondary | ICD-10-CM | POA: Diagnosis not present

## 2021-06-24 DIAGNOSIS — M47812 Spondylosis without myelopathy or radiculopathy, cervical region: Secondary | ICD-10-CM | POA: Diagnosis not present

## 2021-06-24 DIAGNOSIS — Z7982 Long term (current) use of aspirin: Secondary | ICD-10-CM | POA: Insufficient documentation

## 2021-06-24 DIAGNOSIS — I499 Cardiac arrhythmia, unspecified: Secondary | ICD-10-CM | POA: Diagnosis not present

## 2021-06-24 LAB — CBC WITH DIFFERENTIAL/PLATELET
Abs Immature Granulocytes: 0.02 10*3/uL (ref 0.00–0.07)
Basophils Absolute: 0 10*3/uL (ref 0.0–0.1)
Basophils Relative: 0 %
Eosinophils Absolute: 0.1 10*3/uL (ref 0.0–0.5)
Eosinophils Relative: 1 %
HCT: 38.3 % — ABNORMAL LOW (ref 39.0–52.0)
Hemoglobin: 13.4 g/dL (ref 13.0–17.0)
Immature Granulocytes: 0 %
Lymphocytes Relative: 21 %
Lymphs Abs: 1.7 10*3/uL (ref 0.7–4.0)
MCH: 30.2 pg (ref 26.0–34.0)
MCHC: 35 g/dL (ref 30.0–36.0)
MCV: 86.3 fL (ref 80.0–100.0)
Monocytes Absolute: 0.4 10*3/uL (ref 0.1–1.0)
Monocytes Relative: 6 %
Neutro Abs: 5.7 10*3/uL (ref 1.7–7.7)
Neutrophils Relative %: 72 %
Platelets: 164 10*3/uL (ref 150–400)
RBC: 4.44 MIL/uL (ref 4.22–5.81)
RDW: 13.2 % (ref 11.5–15.5)
WBC: 8 10*3/uL (ref 4.0–10.5)
nRBC: 0 % (ref 0.0–0.2)

## 2021-06-24 LAB — BASIC METABOLIC PANEL
Anion gap: 5 (ref 5–15)
BUN: 17 mg/dL (ref 8–23)
CO2: 25 mmol/L (ref 22–32)
Calcium: 8.5 mg/dL — ABNORMAL LOW (ref 8.9–10.3)
Chloride: 105 mmol/L (ref 98–111)
Creatinine, Ser: 0.94 mg/dL (ref 0.61–1.24)
GFR, Estimated: >60 mL/min (ref >60)
Glucose, Bld: 80 mg/dL (ref 70–99)
Potassium: 3.8 mmol/L (ref 3.5–5.1)
Sodium: 135 mmol/L (ref 135–145)

## 2021-06-24 LAB — TROPONIN I (HIGH SENSITIVITY)
Troponin I (High Sensitivity): 7 ng/L (ref <18)
Troponin I (High Sensitivity): 6 ng/L (ref <18)

## 2021-06-24 MED ORDER — SODIUM CHLORIDE 0.9 % IV BOLUS: 500.0000 mL | Freq: Once | INTRAVENOUS | Status: DC

## 2021-06-24 MED ORDER — MECLIZINE HCL 25 MG PO TABS
12.5000 mg | ORAL_TABLET | Freq: Once | ORAL | Status: AC
  Administered 2021-06-24: 12.5 mg via ORAL
  Filled 2021-06-24: qty 1

## 2021-06-24 MED ORDER — IOHEXOL 350 MG/ML SOLN
75.0000 mL | Freq: Once | INTRAVENOUS | Status: AC | PRN
  Administered 2021-06-24: 75 mL via INTRAVENOUS

## 2021-06-24 NOTE — ED Notes (Signed)
Patient given urinal.  Patient declined assistance. ?

## 2021-06-24 NOTE — ED Provider Notes (Signed)
?[facility] ?Provider Note ? ? ?CSN: [id number] ?Arrival date & time: 06/24/21  Bosie Helper ? ?  ? ?History ? ?Chief Complaint  ?Patient presents with  ? Dizziness  ? ? ?Eric Richmond is a 81 y.o. male. ? ?Patient is an 81 year old male with past medical history of hypertension, GERD, OSA, diabetes, and Alzheimer's dementia presenting for dizziness.  Patient's wife is at the bedside states patient had dizziness so severe this morning that he was unable to ambulate.   Dates his baseline is pleasantly demented, alert, able to ambulate without assistance.  States his dizziness was so severe today he was unable to get out of bed.  Patient has previous history of vertigo as diagnosed by primary care physician but states it is never this severe.  Patient resting comfortably in bed at this time denies any symptoms. ? ?The history is provided by the patient. No language interpreter was used.  ?Dizziness ?Associated symptoms: no chest pain, no palpitations, no shortness of breath and no vomiting   ? ?  ? ?Home Medications ?Prior to Admission medications   ?Medication Sig Start Date End Date Taking? Authorizing Provider  ?albuterol (VENTOLIN HFA) 108 (90 Base) MCG/ACT inhaler Inhale 2 puffs into the lungs every 6 (six) hours as needed for wheezing or shortness of breath. 02/11/18   [name], MD  ?amLODipine (NORVASC) 10 MG tablet Take 0.5 tablets (5 mg total) by mouth daily. 04/10/21   [name], MD  ?ascorbic acid (VITAMIN C) 500 MG tablet Take 1 tablet (500 mg total) by mouth daily. 02/17/19   Caren Griffins, MD  ?aspirin EC 81 MG tablet Take 81 mg by mouth daily.    [provider]  ?diazepam (VALIUM) 2 MG tablet Take 1 tablet (2 mg total) by mouth every 8 (eight) hours as needed (dizziness). 04/10/21   [name], MD  ?divalproex (DEPAKOTE) 125 MG DR tablet Take 1 tablet (125 mg total) by mouth 3 (three) times daily. 04/07/21   Rondel Jumbo, PA-C  ?escitalopram  (LEXAPRO) 20 MG tablet Take 1 tablet (20 mg total) by mouth daily. 12/09/20   [name], MD  ?fluticasone Asencion Islam) 50 MCG/ACT nasal spray Place 2 sprays into both nostrils daily. 10/21/17   [name], MD  ?losartan-hydrochlorothiazide (HYZAAR) 50-12.5 MG tablet Take 1 tablet by mouth every evening. 05/30/21   [name], MD  ?memantine (NAMENDA) 10 MG tablet Take 1 tablet (10 mg total) by mouth 2 (two) times daily. 12/09/20   [name], MD  ?Multiple Vitamin (MULTIVITAMIN WITH MINERALS) TABS tablet Take 1 tablet by mouth daily.    [provider]  ?pantoprazole (PROTONIX) 40 MG tablet Take 1 tablet (40 mg total) by mouth daily. 12/09/20   [name], MD  ?atenolol (TENORMIN) 50 MG tablet TAKE 1 TABLET EVERY DAY (NEED MD APPOINTMENT FOR REFILLS) ?Patient taking differently: Take 50 mg by mouth daily.  07/09/18 06/03/19  [name], MD  ?   ? ?Allergies    ?Oxycodone and Tramadol   ? ?Review of Systems   ?Review of Systems  ?Constitutional:  Negative for chills and fever.  ?HENT:  Negative for ear pain and sore throat.   ?Eyes:  Negative for pain and visual disturbance.  ?Respiratory:  Negative for cough and shortness of breath.   ?Cardiovascular:  Negative for chest pain and palpitations.  ?Gastrointestinal:  Negative for abdominal pain and vomiting.  ?Genitourinary:  Negative for dysuria and hematuria.  ?Musculoskeletal:  Negative for arthralgias and back pain.  ?Skin:  Negative for color change and rash.  ?Neurological:  Positive for dizziness. Negative for seizures and syncope.  ?All other systems reviewed and are negative. ? ?Physical Exam ?Updated Vital Signs ?BP (!) 156/101   Pulse (!) 55   Temp 97.7 ?F (36.5 ?C) (Oral)   Resp 19   SpO2 99%  ?Physical Exam ?Vitals and nursing note reviewed.  ?Constitutional:   ?   General: He is not in acute distress. ?   Appearance: He is well-developed.  ?HENT:  ?   Head: Normocephalic and atraumatic.  ?Eyes:  ?   Conjunctiva/sclera:  Conjunctivae normal.  ?Cardiovascular:  ?   Rate and Rhythm: Normal rate and regular rhythm.  ?   Heart sounds: No murmur heard. ?Pulmonary:  ?   Effort: Pulmonary effort is normal. No respiratory distress.  ?   Breath sounds: Normal breath sounds.  ?Abdominal:  ?   Palpations: Abdomen is soft.  ?   Tenderness: There is no abdominal tenderness.  ?Musculoskeletal:     ?   General: No swelling.  ?   Cervical back: Neck supple.  ?Skin: ?   General: Skin is warm and dry.  ?   Capillary Refill: Capillary refill takes less than 2 seconds.  ?Neurological:  ?   Mental Status: He is alert.  ?Psychiatric:     ?   Mood and Affect: Mood normal.  ? ? ?ED Results / Procedures / Treatments   ?Labs ?(all labs ordered are listed, but only abnormal results are displayed) ?Labs Reviewed  ?CBC WITH DIFFERENTIAL/PLATELET - Abnormal; Notable for the following components:  ?    Result Value  ? HCT 38.3 (*)   ? All other components within normal limits  ?BASIC METABOLIC PANEL - Abnormal; Notable for the following components:  ? Calcium 8.5 (*)   ? All other components within normal limits  ?TROPONIN I (HIGH SENSITIVITY)  ?TROPONIN I (HIGH SENSITIVITY)  ? ? ?EKG ?None ? ?Radiology ?CT Head Wo Contrast ? ?Result Date: 06/24/2021 ?CLINICAL DATA:  Dizziness. EXAM: CT HEAD WITHOUT CONTRAST TECHNIQUE: Contiguous axial images were obtained from the base of the skull through the vertex without intravenous contrast. RADIATION DOSE REDUCTION: This exam was performed according to the departmental dose-optimization program which includes automated exposure control, adjustment of the mA and/or kV according to patient size and/or use of iterative reconstruction technique. COMPARISON:  June 13, 2020 FINDINGS: Brain: There is mild cerebral atrophy with widening of the extra-axial spaces and ventricular dilatation. There are areas of decreased attenuation within the white matter tracts of the supratentorial brain, consistent with microvascular disease  changes. Vascular: No hyperdense vessel or unexpected calcification. Skull: No acute osseous abnormalities are identified. 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 Sinuses/Orbits: No acute finding. Other: None. IMPRESSION: 1. No acute intracranial abnormality. 2. Generalized cerebral atrophy and microvascular disease changes of the supratentorial brain. Electronically Signed   By: Virgina Norfolk M.D.   On: 06/24/2021 22:03  ? ?CT Angio Neck W and/or Wo Contrast ? ?Result Date: 06/24/2021 ?CLINICAL DATA:  Vertebral artery dissection suspected, dizziness EXAM: CT ANGIOGRAPHY NECK TECHNIQUE: Multidetector CT imaging of the neck was performed using the standard protocol during bolus administration of intravenous contrast. Multiplanar CT image reconstructions and MIPs were obtained to evaluate the vascular anatomy. Carotid stenosis measurements (when applicable) are obtained utilizing NASCET criteria, using the distal internal carotid diameter  as the denominator. RADIATION DOSE REDUCTION: This exam was performed according to the departmental dose-optimization program which includes automated exposure control, adjustment of the mA and/or kV according to patient size and/or use of iterative reconstruction technique. CONTRAST:  76m OMNIPAQUE IOHEXOL 350 MG/ML SOLN COMPARISON:  None. FINDINGS: Aortic arch: Standard branching. Imaged portion shows no evidence of aneurysm or dissection. No significant stenosis of the major arch vessel origins. Right carotid system: No evidence of dissection, stenosis (50% or greater) or occlusion. Left carotid system: No evidence of dissection, stenosis (50% or greater) or occlusion. Vertebral arteries: Codominant. No evidence of dissection, stenosis (50% or greater) or occlusion. Skeleton: Degenerative changes in the cervical spine. No acute osseous abnormality. Other neck: No acute finding. Upper chest: No focal pulmonary opacity or pleural effusion. IMPRESSION: No  hemodynamically significant stenosis in the neck. No evidence of dissection. Electronically Signed   By: AMerilyn BabaM.D.   On: 06/24/2021 22:06  ? ?DG Chest Portable 1 View ? ?Result Date: 06/24/2021 ?CLINICAL DATA:

## 2021-06-24 NOTE — ED Triage Notes (Signed)
Pt BIB [facility]\EMS fro\m home d/t dizziness that has been going on all day long. Positive orthostatic. Hx vertigo. Pt has dementia. A&o x3. VSS.  ? ?Bp 148/84 ?Hr 60  ?98%RA  ?cbg 201  ?

## 2021-06-26 ENCOUNTER — Other Ambulatory Visit: Payer: Self-pay | Admitting: *Deleted

## 2021-06-26 ENCOUNTER — Telehealth: Payer: Self-pay | Admitting: Physician Assistant

## 2021-06-26 NOTE — Patient Outreach (Signed)
[facility]) Care Management ? ?06/26/2021 ? ?Eric Richmond ?03/21/1940 ?[id number] ? ?Successful telephone outreach call to patient's spouse Eric Richmond to follow up after ED visit on 06/24/21 due to vertigo. HIPAA identifiers obtained. Wife explained that the patient had severe vertigo and his B/P was very high at 197/116 per her monitor. EMS was called to assess the patient. While there wife reports that his B/P decreased and the patient improved. EMS did not take the patient to the ED at that point. Later, Eric Richmond called EMS again due to the vertigo becoming so bad that the patient could not get out of bed. EMS took the patient to the ED where he was treated and later discharged. B/P in the ED was 156/101. Today's B/P at home is 137/89 and wife reports his dizziness/vertigo is better but the patient continues to complain of dizziness. Eric Richmond states she is going to make a follow-up appointment with the patient's PCP today. Wife denies any falls by patient and explains he is using a cane when ambulating. Fall prevention was discussed and nurse advised wife to ask for an order for a rolling walker when she/patient goes to the PCP follow-up appointment. Spouse states that she has not called neurology to clarify the patient's Depakote prescription order. Nurse will call neurology for clarification.  ? ?Plan: RN Health Coach will call patient within the month of June. Nurse left VM with neurology for clarification of Depakote prescription; waiting for call back response. Nurse will send assessment note to PCP. ? ?[name] RN, BSN ?[facility] Care Management  ?RN Health Coach ?[phone] ?[email or web address] ? ?

## 2021-06-26 NOTE — Telephone Encounter (Signed)
Eric Richmond from [employer] called in stating she needs clarification with how the patient is supposed to be taking the Depakote. There seems to be a discrepancy. ?

## 2021-06-27 NOTE — Telephone Encounter (Signed)
Called and spoke to [place] and she informed me that patient showed her his medication bottle of Depakote and the instructions say to take 1 tablet three times daily. I informed Sharee Pimple that per Sara's last note patient was to take Depakote once daily at night. Sharee Pimple informed me that patient has been taking it three times a day and his family noticed he was very lethargic, so they have reduced him to Depakote twice daily. I informed Sharee Pimple that this seems to be an error in the prescription that was sent by Clarise Cruz and I would let Clarise Cruz know and give her a call back.  ? ?Called and spoke to patients wife Eric Richmond and informed her to have patient stop the Depakote right away. Informed Eric Richmond that I will let Dr. Delice Lesch know that patient is needing something to take for sleep and I will call her once I hear back from Dr. Delice Lesch. Patients wife thanked me for the call and will await further directions.  ?

## 2021-06-27 NOTE — Telephone Encounter (Signed)
Since he is lethargic, would not start him on any new medications for now until this clears up. He needs to be awake during the day so he sleeps at night. Can try melatonin in the meantime, but would not start a new medication with similar potential side effects at this time. Update our office how he is in a week, thanks ?

## 2021-06-27 NOTE — Telephone Encounter (Signed)
Called and spoke to patients wife Eric Richmond and informed her of directions from Dr. Delice Lesch that since he is lethargic, would not start him on any new medications for now until this clears up. He needs to be awake during the day so he sleeps at night. Can try melatonin in the meantime, but would not start a new medication with similar potential side effects at this time. Update our office how he is in a week. Patients wife verbalized understanding and will call us in one week to let us know how patient is doing being off the Depakote and using melatonin for sleep.  ? ?Patients wife had no further questions or concerns.  ?

## 2021-06-28 ENCOUNTER — Ambulatory Visit (INDEPENDENT_AMBULATORY_CARE_PROVIDER_SITE_OTHER): Payer: Medicare Other | Admitting: Family Medicine

## 2021-06-28 ENCOUNTER — Encounter: Payer: Self-pay | Admitting: Family Medicine

## 2021-06-28 VITALS — BP 120/70 | HR 62 | Temp 97.8°F | Wt 181.0 lb

## 2021-06-28 DIAGNOSIS — R42 Dizziness and giddiness: Secondary | ICD-10-CM

## 2021-06-28 MED ORDER — FUROSEMIDE 20 MG PO TABS: 20.0000 mg | ORAL_TABLET | Freq: Every day | ORAL | 3 refills | Status: DC

## 2021-06-28 NOTE — Progress Notes (Signed)
? ?  Subjective:  ? ? Patient ID: Eric Richmond, male    DOB: 08-May-1940, 81 y.o.   MRN: [id number] ? ?HPI ?Here with his wife to follow up on a visit to the ED on 06-24-21 for vertigo. He woke up that day so dizzy that he could not stand up without assistance. No headache or any other symptoms. He has a long hx of vertigo, but this was the worst bout he can remember. All tests including labs, EKG, CXR, head CT and a head and neck CTA were unremarkable. He was told to take Meclizine and follow up with [place]. He has done fairly well since then. To summarize, he has tried Meclizine, Valium, and transdermal scopolamine for the vertigo, and nothing has really helped.  ? ? ?Review of Systems  ?Constitutional: Negative.   ?Respiratory: Negative.    ?Cardiovascular: Negative.   ?Neurological:  Positive for dizziness.  ? ?   ?Objective:  ? Physical Exam ?Constitutional:   ?   Appearance: Normal appearance.  ?   Comments: Walks with a cane   ?Cardiovascular:  ?   Rate and Rhythm: Normal rate and regular rhythm.  ?   Pulses: Normal pulses.  ?   Heart sounds: Normal heart sounds.  ?Pulmonary:  ?   Effort: Pulmonary effort is normal.  ?   Breath sounds: Normal breath sounds.  ?Neurological:  ?   Mental Status: He is alert. Mental status is at baseline.  ?   Gait: Gait normal.  ? ? ? ? ? ?   ?Assessment & Plan:  ?He has frequent spells of vertigo. He will try taking Lasix 20 mg every morning for prophylaxis. Report back in one month. We spent a total of (34   ) minutes reviewing records and discussing these issues.  ?Alysia Penna, MD ? ? ? ?

## 2021-07-11 DIAGNOSIS — G4733 Obstructive sleep apnea (adult) (pediatric): Secondary | ICD-10-CM | POA: Diagnosis not present

## 2021-07-12 ENCOUNTER — Other Ambulatory Visit: Payer: Self-pay

## 2021-07-12 MED ORDER — LOSARTAN POTASSIUM-HCTZ 50-12.5 MG PO TABS: 1.0000 | ORAL_TABLET | Freq: Every evening | ORAL | 1 refills | Status: DC

## 2021-07-14 ENCOUNTER — Telehealth: Payer: Self-pay | Admitting: Physician Assistant

## 2021-07-14 NOTE — Telephone Encounter (Signed)
I have contacted wife and advised of increase dose she has plenty at home, no need to send in Rx at this time. Will call back with report.

## 2021-07-14 NOTE — Telephone Encounter (Signed)
The wife called back, she has questions for christy.

## 2021-07-14 NOTE — Telephone Encounter (Signed)
Patients wife [name] called and stated Eric Richmond is having bad nightmares at night.  She states he shakes the bed and fights with her , she cant get him to calm down.  She said she needs help, shes not sleeping and doesn't know what to do for him.

## 2021-07-17 NOTE — Telephone Encounter (Signed)
Mailbox is full, unable to leave messages

## 2021-07-17 NOTE — Telephone Encounter (Signed)
Patient advised to call PCP. Thanked me for calling

## 2021-07-27 ENCOUNTER — Ambulatory Visit (INDEPENDENT_AMBULATORY_CARE_PROVIDER_SITE_OTHER): Payer: Medicare Other | Admitting: Family Medicine

## 2021-07-27 ENCOUNTER — Encounter: Payer: Self-pay | Admitting: Family Medicine

## 2021-07-27 VITALS — BP 104/68 | HR 96 | Temp 98.4°F | Wt 177.4 lb

## 2021-07-27 DIAGNOSIS — K529 Noninfective gastroenteritis and colitis, unspecified: Secondary | ICD-10-CM | POA: Diagnosis not present

## 2021-07-27 DIAGNOSIS — F02818 Dementia in other diseases classified elsewhere, unspecified severity, with other behavioral disturbance: Secondary | ICD-10-CM

## 2021-07-27 DIAGNOSIS — G301 Alzheimer's disease with late onset: Secondary | ICD-10-CM | POA: Diagnosis not present

## 2021-07-27 NOTE — Progress Notes (Signed)
Subjective:    Patient ID: Eric Richmond, male    DOB: 10-06-1940, 81 y.o.   MRN: [id number]  Chief Complaint  Patient presents with   Nausea    With vomiting and gas started this morning. Had constipation a few days ago. Has not taken or tried anything, has not had any solid foods, just water and ginger ale. Has a pain in upper mid abdomen, was complaining about the pain since last month, has had a lot of gas, went to hospital last month.  Pt accompanied by his wife.  HPI Patient was seen today for acute concern.  Per [name], pt woke up this am with emesis  5-6 x , 3-4 episodes of loose stools, and increased belching.  Pt complained of midline epigastric and periumbilical abd pain.  Unsure if blood in stool.  Tried ginger ale, had a few crackers today.  Prior to start of symptoms, pt was constipated, took Colace yesterday.  Had liver, cabbage, and sweet potatoes for dinner.  Often eats nuts.  [name] is not sick.  Pt with h/o diverticulosis.  Last month pt was seen for vertigo, given lasix prn.  Symptoms have improved.  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 Gwenette Greet , cpap -2.0   Osteoarthritis    Right knee and right thumb; s/p total knee replacement   Pneumonia 2015    Allergies  Allergen Reactions   Oxycodone Other (See Comments)    Hallucinations    Tramadol Other (See Comments)    Hallucinations     ROS limited 2/2 dementia  +abd pain, v, loose stools.   Objective:    Blood pressure 104/68, pulse 96, temperature 98.4 F (36.9 C), temperature source Oral, weight 177 lb 6.4 oz (80.5 kg), SpO2  96 %.  Gen. Pleasant, well-nourished, talkative, in no distress, normal affect   HEENT: [place]/AT, face symmetric, conjunctiva clear, no scleral icterus, PERRLA, EOMI, nares patent without drainage, pharynx without erythema or exudate. Neck: No JVD, no thyromegaly, no carotid bruits Lungs: no accessory muscle use, CTAB, no wheezes or rales Cardiovascular: RRR, no m/r/g, no peripheral edema Abdomen: mildly decreased BS, soft, mild generalized, ND Musculoskeletal: No deformities, no cyanosis or clubbing, normal tone Neuro:  A&Ox3, CN II-XII intact, normal gait Skin:  Warm, no lesions/ rash   Wt Readings from Last 3 Encounters:  07/27/21 177 lb 6.4 oz (80.5 kg)  06/28/21 181 lb (82.1 kg)  05/30/21 183 lb (83 kg)    Lab Results  Component Value Date   WBC 8.0 06/24/2021   HGB 13.4 06/24/2021   HCT 38.3 (L) 06/24/2021   PLT 164 06/24/2021   GLUCOSE 80 06/24/2021   CHOL 145 12/09/2020   TRIG 107.0 12/09/2020   HDL 47.70 12/09/2020   LDLCALC 76 12/09/2020   ALT 12 12/09/2020   AST 21 12/09/2020   NA 135 06/24/2021   K 3.8 06/24/2021   CL 105 06/24/2021   CREATININE 0.94 06/24/2021   BUN 17 06/24/2021   CO2 25 06/24/2021   TSH 2.74 12/09/2020  PSA 1.39 12/09/2020   INR 1.01 10/19/2013   HGBA1C 6.1 12/09/2020    Assessment/Plan:  Gastroenteritis -loose stools may have been increased by use of Colace  -bland diet, adv as tolerated -discussed the importance of hydration.  Small sips of fluids encouraged.  ORS or sports drinks -Expectant management including OTC meds. -unable to obtain labs as clinic lab closed at time of appt. -Discussed s/s of diverticulitis as pt has h/o diverticulosis on colonoscopy 04/10/2019. -contact clinic for continued or worsened symptoms.  Late onset Alzheimer's dementia with behavioral disturbance ([place]) -continue current meds -continue f/u with Neurology.  F/u prn  Grier Mitts, MD

## 2021-08-09 ENCOUNTER — Other Ambulatory Visit: Payer: Self-pay | Admitting: *Deleted

## 2021-08-09 NOTE — Patient Outreach (Signed)
[facility]) Care Management  08/09/2021  Eddy Liszewski [place] Nov 16, 1940 [id number]  Unsuccessful outreach attempt made to patient. RN Health Coach left HIPAA compliant voicemail message along with her contact information.  Plan: RN Health Coach will call patient within the month of July.  [name] RN, BSN [place] [phone] [email or web address]

## 2021-08-11 ENCOUNTER — Ambulatory Visit (INDEPENDENT_AMBULATORY_CARE_PROVIDER_SITE_OTHER): Payer: Medicare Other | Admitting: Family

## 2021-08-11 ENCOUNTER — Encounter: Payer: Self-pay | Admitting: Family

## 2021-08-11 ENCOUNTER — Ambulatory Visit (INDEPENDENT_AMBULATORY_CARE_PROVIDER_SITE_OTHER)
Admission: RE | Admit: 2021-08-11 | Discharge: 2021-08-11 | Disposition: A | Discharge status: Home / Self Care | Payer: Medicare Other | Source: Ambulatory Visit | Attending: Family | Admitting: Family

## 2021-08-11 DIAGNOSIS — K5900 Constipation, unspecified: Secondary | ICD-10-CM | POA: Diagnosis not present

## 2021-08-11 DIAGNOSIS — K219 Gastro-esophageal reflux disease without esophagitis: Secondary | ICD-10-CM

## 2021-08-11 DIAGNOSIS — R1013 Epigastric pain: Secondary | ICD-10-CM

## 2021-08-11 DIAGNOSIS — R0600 Dyspnea, unspecified: Secondary | ICD-10-CM

## 2021-08-11 DIAGNOSIS — R0602 Shortness of breath: Secondary | ICD-10-CM | POA: Diagnosis not present

## 2021-08-11 DIAGNOSIS — G4733 Obstructive sleep apnea (adult) (pediatric): Secondary | ICD-10-CM | POA: Diagnosis not present

## 2021-08-11 DIAGNOSIS — F02818 Dementia in other diseases classified elsewhere, unspecified severity, with other behavioral disturbance: Secondary | ICD-10-CM | POA: Diagnosis not present

## 2021-08-11 DIAGNOSIS — G301 Alzheimer's disease with late onset: Secondary | ICD-10-CM

## 2021-08-11 DIAGNOSIS — R109 Unspecified abdominal pain: Secondary | ICD-10-CM | POA: Diagnosis not present

## 2021-08-11 MED ORDER — OMEPRAZOLE 20 MG PO CPDR: 20.0000 mg | DELAYED_RELEASE_CAPSULE | Freq: Every day | ORAL | 0 refills | Status: DC

## 2021-08-11 NOTE — Progress Notes (Unsigned)
Acute Office Visit  Subjective:     Patient ID: Eric Richmond, male    DOB: [birth year], 81 y.o.   MRN: [id number]  Chief Complaint  Patient presents with   Abdominal Pain    Patient complains of upper-mid abdominal pain x2 -3 weeks   Shortness of Breath    Patient complains of shortness of breath with exertion x2 weeks    HPI   Patient has advanced dementia and is a poor historian.   Patient is in today accompanied by his wife who says he has c/o increased burping, pain in his chest, shortness of breath and more gas that usual. Takes Protonix that helps. Denies any recent dietary changes. Reports recovering from a GI illness 2 weeks ago where he has nausea, vomiting, and diarrhea. Denies any blood in his stool or dark black stool. Shortness of breath is more when walking. No chest pain.   Wife is concerned that she has noticed involuntary movements at night when he is using his CPAP machine. She reports not knowing if he is conscious or not. The activity lasts a few seconds then goes away. She is concerned about seizures.  This has been going on the last 2 weeks.  Review of Systems  Cardiovascular: Negative.  Negative for palpitations.  Gastrointestinal:  Positive for abdominal pain, heartburn, nausea and vomiting. Negative for blood in stool, constipation and melena.  Musculoskeletal: Negative.   Skin: Negative.   Neurological: Negative.   Psychiatric/Behavioral:  Positive for memory loss.   All other systems reviewed and are negative.       Objective:    There were no vitals taken for this visit. {Vitals History (Optional):23777}  Physical Exam Vitals and nursing note reviewed.  Constitutional:      Appearance: He is well-developed and normal weight.  HENT:     Head: Normocephalic.  Cardiovascular:     Rate and Rhythm: Normal rate and regular rhythm.     Comments: Chest tenderness reproducible to palpation Abdominal:     General: Bowel sounds are normal. There  is no distension.     Palpations: Abdomen is soft.     Tenderness: There is abdominal tenderness in the epigastric area. There is no guarding or rebound. Negative signs include Murphy's sign and McBurney's sign.     Hernia: No hernia is present.     Comments: Epigastric tenderness noted.Tenderness in the sternal area to palpation.   Skin:    General: Skin is warm and dry.  Neurological:     General: No focal deficit present.     Mental Status: He is alert and oriented to person, place, and time.  Psychiatric:        Mood and Affect: Mood normal.        Behavior: Behavior normal.     Comments: Poor historian r/t dementia     No results found for any visits on 08/11/21.      Assessment & Plan:   Problem List Items Addressed This Visit     OSA (obstructive sleep apnea)   Relevant Orders   Nocturnal polysomnography (NPSG)   Late onset Alzheimer's dementia with behavioral disturbance (HCC)   GERD (gastroesophageal reflux disease)   Relevant Medications   omeprazole (PRILOSEC) 20 MG capsule   Other Relevant Orders   DG Chest 2 View   DG Abd 1 View   POC H. pylori Screen   Other Visit Diagnoses     Epigastric pain    -  Primary   Relevant Orders   DG Chest 2 View   POC H. pylori Screen   Dyspnea, unspecified type       Relevant Orders   DG Chest 2 View       Meds ordered this encounter  Medications   omeprazole (PRILOSEC) 20 MG capsule    Sig: Take 1 capsule (20 mg total) by mouth daily.    Dispense:  15 capsule    Refill:  0   Labs and xrays obtained today. Will notify patient pending results. Temporary trial of omeprazole given today to help with heartburn and indigestion. The symptoms are likely related to covering from the GI illness where he has vomiting. To the Ed if chest pain occurs. Will follow-up pending labs. Referral for sleep study placed to evaluate movements during sleep.  No follow-ups on file.  Kennyth Arnold, FNP

## 2021-08-15 ENCOUNTER — Telehealth: Payer: Self-pay | Admitting: Family Medicine

## 2021-08-15 NOTE — Telephone Encounter (Signed)
Pt wife is calling and would like xray result and pt has lost 9 lbs

## 2021-08-15 NOTE — Telephone Encounter (Signed)
Spoke to wife ([name] on [place]). Inform her of [place]'s result note for patient.

## 2021-08-18 ENCOUNTER — Ambulatory Visit (INDEPENDENT_AMBULATORY_CARE_PROVIDER_SITE_OTHER): Payer: Medicare Other | Admitting: Family Medicine

## 2021-08-18 ENCOUNTER — Encounter: Payer: Self-pay | Admitting: Family Medicine

## 2021-08-18 VITALS — BP 102/70 | HR 59 | Temp 97.9°F | Wt 172.0 lb

## 2021-08-18 DIAGNOSIS — K5900 Constipation, unspecified: Secondary | ICD-10-CM | POA: Insufficient documentation

## 2021-08-18 MED ORDER — DOCUSATE SODIUM 100 MG PO CAPS: 100.0000 mg | ORAL_CAPSULE | Freq: Two times a day (BID) | ORAL | 11 refills | Status: DC

## 2021-09-12 ENCOUNTER — Ambulatory Visit (HOSPITAL_BASED_OUTPATIENT_CLINIC_OR_DEPARTMENT_OTHER): Payer: Medicare Other | Attending: Family | Admitting: Internal Medicine

## 2021-09-12 DIAGNOSIS — G4733 Obstructive sleep apnea (adult) (pediatric): Secondary | ICD-10-CM

## 2021-09-13 ENCOUNTER — Other Ambulatory Visit: Payer: Self-pay | Admitting: *Deleted

## 2021-09-13 NOTE — Patient Outreach (Signed)
[place] [facility]) Care Management  09/13/2021  ARTAVIOUS TREBILCOCK 13-Oct-1940 [id number]  Nurse spoke with patient's wife [name] to inform them that their PCP has a RN CM what will follow up with them in the future. Patient verbalized understanding and was appreciative for the care this nurse gave to the patient.  Plan: RN Health Coach will close case.    [name] RN, BSN [place] [phone] [email or web address]

## 2021-09-21 ENCOUNTER — Encounter: Payer: Self-pay | Admitting: Family Medicine

## 2021-09-21 ENCOUNTER — Ambulatory Visit (INDEPENDENT_AMBULATORY_CARE_PROVIDER_SITE_OTHER): Payer: Medicare Other | Admitting: Family Medicine

## 2021-09-21 VITALS — BP 100/60 | HR 57 | Temp 97.5°F | Wt 170.1 lb

## 2021-09-21 DIAGNOSIS — K219 Gastro-esophageal reflux disease without esophagitis: Secondary | ICD-10-CM | POA: Diagnosis not present

## 2021-09-21 DIAGNOSIS — R42 Dizziness and giddiness: Secondary | ICD-10-CM | POA: Diagnosis not present

## 2021-09-21 DIAGNOSIS — R1013 Epigastric pain: Secondary | ICD-10-CM

## 2021-09-21 DIAGNOSIS — R682 Dry mouth, unspecified: Secondary | ICD-10-CM | POA: Diagnosis not present

## 2021-09-21 DIAGNOSIS — I10 Essential (primary) hypertension: Secondary | ICD-10-CM | POA: Diagnosis not present

## 2021-09-21 NOTE — Progress Notes (Signed)
   Subjective:    Patient ID: Eric Richmond, male    DOB: [birth year], 81 y.o.   MRN: [id number]  HPI Here with his wife for several issues. First he has been complaining of upper or central abdominal pains for the past few weeks. No nausea or vomiting. His appetite is good. His BMs are regular. His last colonoscopy in 2021 showed some diverticula and a few adenomatous polyps. He is supposed to be taking Pantoprazole daily for GERD, but his wife is not sure if he is taking this now or not. Also he complains ot dry mouth frequenty. He also ha sbeen having low BP readings, often in the 50-539 systolic range. He continues to have frequent spells of lightheadedness. He has been on Lasix since March but this has not been helping.    Review of Systems  Constitutional: Negative.   Respiratory: Negative.    Cardiovascular: Negative.   Gastrointestinal:  Positive for abdominal pain. Negative for abdominal distention, blood in stool, constipation, diarrhea, nausea, rectal pain and vomiting.  Neurological:  Positive for dizziness and light-headedness. Negative for headaches.       Objective:   Physical Exam Constitutional:      Appearance: Normal appearance. He is not ill-appearing.  Cardiovascular:     Rate and Rhythm: Normal rate and regular rhythm.     Pulses: Normal pulses.     Heart sounds: Normal heart sounds.  Pulmonary:     Effort: Pulmonary effort is normal.     Breath sounds: Normal breath sounds.  Abdominal:     General: Abdomen is flat. Bowel sounds are normal. There is no distension.     Palpations: Abdomen is soft.     Tenderness: There is no guarding or rebound.     Hernia: No hernia is present.     Comments: Mildly tender in the epigastrium   Neurological:     Mental Status: He is alert. Mental status is at baseline.           Assessment & Plan:  His upper abdominal pain is likely due to GERD. I asked them to make sure he is taking the Pantoprazole every day. We will  set up an [place] to look at the gallbladder. His dry mouth is likely an effect of the Lasix, and this does  not seem to be helping with the vertigo anyway. We will stop this. For the low BP, they will take only 1/2 tablet (5 mg) of Amlodipine every day.  Alysia Penna, MD

## 2021-09-23 DIAGNOSIS — G4733 Obstructive sleep apnea (adult) (pediatric): Secondary | ICD-10-CM | POA: Diagnosis not present

## 2021-09-23 NOTE — Procedures (Signed)
     Patient Name: Eric Richmond, Eric Richmond Date: 09/12/2021 Gender: Male D.O.B: 02-Jun-1940 Age (years): 27 Referring Provider: Dutch Quint FNP Height (inches): 68 Interpreting Physician: Baird Lyons MD, ABSM Weight (lbs): 170 RPSGT: Zadie Rhine BMI: 26 MRN: [id number] Neck Size: 14.00  CLINICAL INFORMATION Sleep Study Type: NPSG Indication for sleep study: Snoring Epworth Sleepiness Score: 8  SLEEP STUDY TECHNIQUE As per the AASM Manual for the Scoring of Sleep and Associated Events v2.3 (April 2016) with a hypopnea requiring 4% desaturations.  The channels recorded and monitored were frontal, central and occipital EEG, electrooculogram (EOG), submentalis EMG (chin), nasal and oral airflow, thoracic and abdominal wall motion, anterior tibialis EMG, snore microphone, electrocardiogram, and pulse oximetry.  MEDICATIONS Medications self-administered by patient taken the night of the study : NAMENDA, HYZAAR, MELATONIN  SLEEP ARCHITECTURE The study was initiated at 9:59:33 PM and ended at 4:36:25 AM.  Sleep onset time was 5.9 minutes and the sleep efficiency was 85.3%%. The total sleep time was 338.5 minutes.  Stage REM latency was 222.5 minutes.  The patient spent 3.0%% of the night in stage N1 sleep, 91.9%% in stage N2 sleep, 0.0%% in stage N3 and 5.2% in REM.  Alpha intrusion was absent.  Supine sleep was 68.09%.  RESPIRATORY PARAMETERS The overall apnea/hypopnea index (AHI) was 14.9 per hour. There were 13 total apneas, including 13 obstructive, 0 central and 0 mixed apneas. There were 71 hypopneas and 45 RERAs.  The AHI during Stage REM sleep was 37.7 per hour.  AHI while supine was 20.6 per hour.  The mean oxygen saturation was 92.4%. The minimum SpO2 during sleep was 85.0%.  moderate snoring was noted during this study.  CARDIAC DATA The 2 lead EKG demonstrated sinus rhythm. The mean heart rate was 48.0 beats per minute. Other EKG findings include: None  LEG  MOVEMENT DATA The total PLMS were 0 with a resulting PLMS index of 0.0. Associated arousal with leg movement index was 2.3 .  IMPRESSIONS - Mild to moderate obstructive sleep apnea occurred during this study (AHI = 14.9/h). - Mild oxygen desaturation was noted during this study (Min O2 = 85.0%).  Mean 92.4% - The patient snored with moderate snoring volume. - No cardiac abnormalities were noted during this study. - Clinically significant periodic limb movements did not occur during sleep. No significant associated arousals.  DIAGNOSIS - Obstructive Sleep Apnea (G47.33)  RECOMMENDATIONS - Suggest CPAP titration sleep study or autopap. Other options would be based on clinical judgment. - Be careful with  alcohol, sedatives and other CNS depressants that may worsen sleep apnea and disrupt normal sleep architecture. - Sleep hygiene should be reviewed to assess factors that may improve sleep quality. - Weight management and regular exercise should be initiated or continued if appropriate.  [Electronically signed] 09/23/2021 12:40 PM  Baird Lyons MD, [place], American Board of Sleep Medicine NPI: [id number]                        [place], [facility] [facility]  ELECTRONICALLY SIGNED ON:  09/23/2021, 12:38 PM [place] PH: [phone]   FX: [phone] [place]

## 2021-09-29 ENCOUNTER — Ambulatory Visit
Admission: RE | Admit: 2021-09-29 | Discharge: 2021-09-29 | Disposition: A | Discharge status: Home / Self Care | Payer: Medicare Other | Source: Ambulatory Visit | Attending: Family Medicine | Admitting: Family Medicine

## 2021-09-29 DIAGNOSIS — N281 Cyst of kidney, acquired: Secondary | ICD-10-CM | POA: Diagnosis not present

## 2021-09-29 DIAGNOSIS — R1013 Epigastric pain: Secondary | ICD-10-CM

## 2021-10-03 NOTE — Addendum Note (Signed)
Addended by: Alysia Penna A on: 10/03/2021 12:25 PM   Modules accepted: Orders

## 2021-10-04 ENCOUNTER — Other Ambulatory Visit: Payer: Self-pay | Admitting: *Deleted

## 2021-10-04 ENCOUNTER — Telehealth: Payer: Self-pay | Admitting: Family Medicine

## 2021-10-04 ENCOUNTER — Telehealth (INDEPENDENT_AMBULATORY_CARE_PROVIDER_SITE_OTHER): Payer: Medicare Other | Admitting: Family Medicine

## 2021-10-04 ENCOUNTER — Encounter: Payer: Self-pay | Admitting: Family Medicine

## 2021-10-04 ENCOUNTER — Encounter: Payer: Self-pay | Admitting: *Deleted

## 2021-10-04 VITALS — Ht 68.0 in | Wt 170.0 lb

## 2021-10-04 DIAGNOSIS — R051 Acute cough: Secondary | ICD-10-CM

## 2021-10-04 DIAGNOSIS — U071 COVID-19: Secondary | ICD-10-CM

## 2021-10-04 DIAGNOSIS — J029 Acute pharyngitis, unspecified: Secondary | ICD-10-CM

## 2021-10-04 LAB — POC COVID19 BINAXNOW: SARS Coronavirus 2 Ag: POSITIVE — AB

## 2021-10-04 LAB — POCT INFLUENZA A/B
Influenza A, POC: NEGATIVE
Influenza B, POC: NEGATIVE

## 2021-10-04 MED ORDER — CEFUROXIME AXETIL 500 MG PO TABS
500.0000 mg | ORAL_TABLET | Freq: Two times a day (BID) | ORAL | 0 refills | Status: AC
Start: 2021-10-04 — End: 2021-10-14

## 2021-10-04 MED ORDER — MOLNUPIRAVIR EUA 200MG CAPSULE: 4.0000 | ORAL_CAPSULE | Freq: Two times a day (BID) | ORAL | 0 refills | Status: AC

## 2021-10-04 NOTE — Patient Instructions (Signed)
Visit Information  Thank you for taking time to visit with me today. Please don't hesitate to contact me if I can be of assistance to you.   Following are the goals we discussed today:   Goals Addressed               This Visit's Progress     Rectal pain (pt-stated)        Care Coordination Interventions: Reviewed medications with patient and discussed purpose of all medications Reviewed scheduled/upcoming provider appointments including All pending appointments and pt has an appointment today scheduled with PCP. Requested spouse to inquired on an appointment for an AWV. Advised patient to discuss rectal pain with provider Screening for signs and symptoms of depression related to chronic disease state  Assessed social determinant of health barriers           Please call the care guide team at [phone] if you need to cancel or reschedule your appointment.   If you are experiencing a Mental Health or [place] or need someone to talk to, please call the Suicide and Crisis Lifeline: 988  The patient verbalized understanding of instructions, educational materials, and care plan provided today and DECLINED offer to receive copy of patient instructions, educational materials, and care plan.   No further follow up required: no further needs  Raina Mina, RN Care Management Coordinator [place] Office [phone]

## 2021-10-04 NOTE — Patient Outreach (Signed)
  Care Coordination   Initial Visit Note   10/04/2021 Name: HAIDEN RAWLINSON MRN: [id number] DOB: 1940-11-12  Littie Deeds Scholze is a 81 y.o. year old male who sees [name], MD for primary care. I spoke with  Littie Deeds Taft by phone today  What matters to the patients health and wellness today?  Rectal pain    Goals Addressed               This Visit's Progress     Rectal pain (pt-stated)        Care Coordination Interventions: Reviewed medications with patient and discussed purpose of all medications Reviewed scheduled/upcoming provider appointments including All pending appointments and pt has an appointment today scheduled with PCP. Requested spouse to inquired on an appointment for an AWV. Advised patient to discuss rectal pain with provider Screening for signs and symptoms of depression related to chronic disease state  Assessed social determinant of health barriers         SDOH assessments and interventions completed:  Yes  SDOH Interventions Today    Flowsheet Row Most Recent Value  SDOH Interventions   Food Insecurity Interventions Intervention Not Indicated  Housing Interventions Intervention Not Indicated  Transportation Interventions Intervention Not Indicated        Care Coordination Interventions Activated:  Yes  Care Coordination Interventions:  Yes, provided   Follow up plan: No further intervention required.   Encounter Outcome:  Pt. Visit Completed   Raina Mina, RN Care Management Coordinator [place] Office [phone]

## 2021-10-04 NOTE — Telephone Encounter (Signed)
He and his wife are sick, and he tested positive for Covid this morning. He is being treated with Molnupiravir. Now this afternoon I am seeing his wife who has similar symptoms. She has tested positive for both Covid and Strep, so she will be treated with Molnupiravir and Cefuroxime. Though [name] was not tested for Strep, he likely has this infection as well. We will treat him with Cefuroxime also.

## 2021-10-04 NOTE — Progress Notes (Signed)
Virtual Visit via Telephone Note  I connected with Roxie L Blasdell on 10/04/21 at  3:00 PM EDT by telephone and verified that I am speaking with the correct person using two identifiers.  Call attempted via video however this provider unable to hear or see pt. Several attempts to make contact via video and phone made before able to connect with patient via phone.   I discussed the limitations, risks, security and privacy concerns of performing an evaluation and management service by telephone and the availability of in person appointments. I also discussed with the patient that there may be a patient responsible charge related to this service. The patient expressed understanding and agreed to proceed.  Location patient: home Location provider: work or home office Participants present for the call: patient, provider Patient did not have a visit in the prior 7 days to address this/these issue(s).  Hx given by pt's wife. Chief Complaint  Patient presents with   Cough   Diarrhea    History of Present Illness: Pt is an 81 yo male with pmh sig for DM II, HTN, OSA, Alzheimer's dementia, OA followed by [name] and seen for acute concern.  Pt with confusion, diarrhea, cough, decreased appetite, subjective fever starting yesterday. Denies rhinorrhea, n/v, sore throat.  Tried water and breakfast for symptoms. Pt tested COVID positive at clinic this afternoon.   Pt had 2 COVID vaccines and 2 boosters.  Observations/Objective: Patient sounds cheerful and well on the phone. I do not appreciate any SOB. Speech and thought processing are grossly intact. Patient reported vitals:  Assessment and Plan: COVID-19 virus infection - Plan: molnupiravir EUA (LAGEVRIO) 200 mg CAPS capsule  Acute cough - Plan: POC COVID-19, POC Influenza A/B  Sore throat - Plan: POC COVID-19, POC Influenza A/B  COVID-positive testing 10/04/2021 with symptoms starting yesterday.  Discussed expectant management of symptoms with  OTC cough medications.  Also discussed r/b/a of antiviral medications.  Will start molnupiravir.  Discussed quarantine.  Given strict precautions.  Follow Up Instructions:  F/u prn with pcp   [id number] 5-10 [id number] 11-20 9443 21-30 I did not refer this patient for an OV in the next 24 hours for this/these issue(s).  I discussed the assessment and treatment plan with the patient. The patient was provided an opportunity to ask questions and all were answered. The patient agreed with the plan and demonstrated an understanding of the instructions.   The patient was advised to call back or seek an in-person evaluation if the symptoms worsen or if the condition fails to improve as anticipated.  I provided 13 minutes of non-face-to-face time during this encounter.  Billie Ruddy, MD

## 2021-10-05 ENCOUNTER — Ambulatory Visit: Payer: Medicare Other | Admitting: Physician Assistant

## 2021-10-06 ENCOUNTER — Ambulatory Visit: Payer: Medicare Other | Admitting: Neurology

## 2021-10-06 ENCOUNTER — Ambulatory Visit: Payer: Medicare Other | Admitting: Physician Assistant

## 2021-10-09 ENCOUNTER — Encounter (HOSPITAL_COMMUNITY): Payer: Self-pay | Admitting: *Deleted

## 2021-10-09 ENCOUNTER — Emergency Department (HOSPITAL_COMMUNITY): Payer: Medicare Other

## 2021-10-09 ENCOUNTER — Other Ambulatory Visit: Payer: Self-pay

## 2021-10-09 ENCOUNTER — Emergency Department (HOSPITAL_COMMUNITY)
Admission: EM | Admit: 2021-10-09 | Discharge: 2021-10-09 | Disposition: A | Discharge status: Home / Self Care | Payer: Medicare Other | Attending: Emergency Medicine | Admitting: Emergency Medicine

## 2021-10-09 ENCOUNTER — Telehealth: Payer: Self-pay

## 2021-10-09 DIAGNOSIS — Z7982 Long term (current) use of aspirin: Secondary | ICD-10-CM | POA: Insufficient documentation

## 2021-10-09 DIAGNOSIS — J1281 Pneumonia due to SARS-associated coronavirus: Secondary | ICD-10-CM | POA: Insufficient documentation

## 2021-10-09 DIAGNOSIS — R42 Dizziness and giddiness: Secondary | ICD-10-CM | POA: Insufficient documentation

## 2021-10-09 DIAGNOSIS — R092 Respiratory arrest: Secondary | ICD-10-CM | POA: Diagnosis not present

## 2021-10-09 DIAGNOSIS — B9729 Other coronavirus as the cause of diseases classified elsewhere: Secondary | ICD-10-CM | POA: Insufficient documentation

## 2021-10-09 DIAGNOSIS — J1282 Pneumonia due to coronavirus disease 2019: Secondary | ICD-10-CM | POA: Diagnosis not present

## 2021-10-09 DIAGNOSIS — R6889 Other general symptoms and signs: Secondary | ICD-10-CM | POA: Diagnosis not present

## 2021-10-09 DIAGNOSIS — R0602 Shortness of breath: Secondary | ICD-10-CM | POA: Diagnosis not present

## 2021-10-09 DIAGNOSIS — F01A3 Vascular dementia, mild, with mood disturbance: Secondary | ICD-10-CM | POA: Diagnosis not present

## 2021-10-09 DIAGNOSIS — Z743 Need for continuous supervision: Secondary | ICD-10-CM | POA: Diagnosis not present

## 2021-10-09 DIAGNOSIS — U071 COVID-19: Secondary | ICD-10-CM

## 2021-10-09 LAB — CBC WITH DIFFERENTIAL/PLATELET
Abs Immature Granulocytes: 0.01 10*3/uL (ref 0.00–0.07)
Basophils Absolute: 0 10*3/uL (ref 0.0–0.1)
Basophils Relative: 0 %
Eosinophils Absolute: 0.1 10*3/uL (ref 0.0–0.5)
Eosinophils Relative: 3 %
HCT: 35.8 % — ABNORMAL LOW (ref 39.0–52.0)
Hemoglobin: 12.7 g/dL — ABNORMAL LOW (ref 13.0–17.0)
Immature Granulocytes: 0 %
Lymphocytes Relative: 39 %
Lymphs Abs: 2 10*3/uL (ref 0.7–4.0)
MCH: 29.8 pg (ref 26.0–34.0)
MCHC: 35.5 g/dL (ref 30.0–36.0)
MCV: 84 fL (ref 80.0–100.0)
Monocytes Absolute: 0.3 10*3/uL (ref 0.1–1.0)
Monocytes Relative: 5 %
Neutro Abs: 2.8 10*3/uL (ref 1.7–7.7)
Neutrophils Relative %: 53 %
Platelets: 159 10*3/uL (ref 150–400)
RBC: 4.26 MIL/uL (ref 4.22–5.81)
RDW: 13 % (ref 11.5–15.5)
WBC: 5.2 10*3/uL (ref 4.0–10.5)
nRBC: 0 % (ref 0.0–0.2)

## 2021-10-09 LAB — COMPREHENSIVE METABOLIC PANEL
ALT: 14 U/L (ref 0–44)
AST: 25 U/L (ref 15–41)
Albumin: 3.5 g/dL (ref 3.5–5.0)
Alkaline Phosphatase: 54 U/L (ref 38–126)
Anion gap: 4 — ABNORMAL LOW (ref 5–15)
BUN: 13 mg/dL (ref 8–23)
CO2: 26 mmol/L (ref 22–32)
Calcium: 9 mg/dL (ref 8.9–10.3)
Chloride: 107 mmol/L (ref 98–111)
Creatinine, Ser: 1.09 mg/dL (ref 0.61–1.24)
GFR, Estimated: >60 mL/min (ref >60)
Glucose, Bld: 102 mg/dL — ABNORMAL HIGH (ref 70–99)
Potassium: 3.6 mmol/L (ref 3.5–5.1)
Sodium: 137 mmol/L (ref 135–145)
Total Bilirubin: 0.8 mg/dL (ref 0.3–1.2)
Total Protein: 6.3 g/dL — ABNORMAL LOW (ref 6.5–8.1)

## 2021-10-09 NOTE — ED Triage Notes (Signed)
Pt from home with ems for DOE, dx with COVID and strept last Wednesday. Hx of sleep apnea but does not use his CPAP.   98% room air.

## 2021-10-09 NOTE — Discharge Instructions (Addendum)
See your primary doctor and pulmonologist later this week. Finish your prescription. Return for new concerns.

## 2021-10-09 NOTE — ED Notes (Signed)
Wife reports she is concerned for patient at night because of increased apneic episodes and nightmares. Wife reports that the patient becomes violent and she is afraid to stay in the bed with him. Wife is requesting observation of patient to figure out what is causing his difficulty breathing at night and solution for nightmares. Wife also reports patient is unable to tolerate CPAP d/t progression of dementia.

## 2021-10-09 NOTE — Telephone Encounter (Signed)
--  Caller states her spouse has sleep apnea and he is not on the machine because it make him worst. Caller states he seems to be choking at cant breathe at night and having nightmares and fighting and carry on. Caller states he was dx with covid last Wednesday. Caller states his breath will cut off if he doesn't get help right away. Caller states during the day he is not having difficulty breathing just at night  10/09/2021 10:51:03 AM Go to ED Now Eric Rover, RN, Zella Ball  Comments User: Wilson Singer, RN Date/Time Eilene Ghazi Time): 10/09/2021 10:45:34 AM Was seen in office for covid and tomorrow is the last day of the paxlovid.  User: Wilson Singer, RN Date/Time Eilene Ghazi Time): 10/09/2021 10:45:43 AM hx of dementia.  User: Wilson Singer, RN Date/Time Eilene Ghazi Time): 10/09/2021 10:46:01 AM Stopped using the c pap machine 2 months ago.  Referrals [facility] - ED

## 2021-10-09 NOTE — ED Provider Triage Note (Signed)
Emergency Medicine Provider Triage Evaluation Note  Eric Richmond , a 81 y.o. male  was evaluated in triage.  Pt complains of shortness of breath.  Patient has a history of Alzheimer's dementia is alert to person only.  EMS reports that patient presents from home with reports of shortness of breath.  Patient is COVID-positive.  At this time patient reports that he has felt dizzy earlier this morning.  Patient denies any other symptoms.  Review of Systems  Positive:  Negative: \  Unable to obtain due to dementia  Physical Exam  BP 133/85   Pulse 69   Temp 98.2 F (36.8 C) (Oral)   Resp 17   SpO2 94%  Gen:   Awake, no distress   Resp:  Normal effort, clear to auscultation bilaterally MSK:   Moves extremities without difficulty  Other:  No swelling or tenderness to bilateral lower extremities  Medical Decision Making  Medically screening exam initiated at 12:35 PM.  Appropriate orders placed.  Eric Richmond was informed that the remainder of the evaluation will be completed by another provider, this initial triage assessment does not replace that evaluation, and the importance of remaining in the ED until their evaluation is complete.     Loni Beckwith, [place] 10/09/21 1236

## 2021-10-09 NOTE — ED Triage Notes (Signed)
To ED via [facility] states he was c/o being dizzy. Patient is confused to date and tome

## 2021-10-10 NOTE — ED Provider Notes (Signed)
[place] EMERGENCY DEPARTMENT Provider Note   CSN: [id number] Arrival date & time: 10/09/21  1209     History  Chief Complaint  Patient presents with   Covid Positive   Shortness of Breath   Dizziness    Eric Richmond is a 81 y.o. male.  Patient with history of dementia presents with intermittent shortness of breath for the past week.  No chest pain, no syncope.  Patient has mild dementia history.  Patient had positive COVID test on Wednesday.  Details per patient's significant other.  Patient tolerating oral liquids.       Home Medications Prior to Admission medications   Medication Sig Start Date End Date Taking? Authorizing Provider  albuterol (VENTOLIN HFA) 108 (90 Base) MCG/ACT inhaler Inhale 2 puffs into the lungs every 6 (six) hours as needed for wheezing or shortness of breath. 02/11/18   [name], MD  amLODipine (NORVASC) 10 MG tablet Take 0.5 tablets (5 mg total) by mouth daily. 04/10/21   [name], MD  ascorbic acid (VITAMIN C) 500 MG tablet Take 1 tablet (500 mg total) by mouth daily. 02/17/19   Caren Griffins, MD  aspirin EC 81 MG tablet Take 81 mg by mouth daily.    [provider]  cefUROXime (CEFTIN) 500 MG tablet Take 1 tablet (500 mg total) by mouth 2 (two) times daily with a meal for 10 days. 10/04/21 10/14/21  [name], MD  diazepam (VALIUM) 2 MG tablet Take 1 tablet (2 mg total) by mouth every 8 (eight) hours as needed (dizziness). 04/10/21   [name], MD  docusate sodium (COLACE) 100 MG capsule Take 1 capsule (100 mg total) by mouth 2 (two) times daily. 08/18/21   [name], MD  escitalopram (LEXAPRO) 20 MG tablet Take 1 tablet (20 mg total) by mouth daily. 12/09/20   [name], MD  fluticasone (FLONASE) 50 MCG/ACT nasal spray Place 2 sprays into both nostrils daily. 10/21/17   [name], MD  furosemide (LASIX) 20 MG tablet Take 1 tablet (20 mg total) by mouth daily. 06/28/21   [name],  MD  losartan-hydrochlorothiazide (HYZAAR) 50-12.5 MG tablet Take 1 tablet by mouth every evening. 07/12/21   [name], MD  memantine (NAMENDA) 10 MG tablet Take 1 tablet (10 mg total) by mouth 2 (two) times daily. 12/09/20   [name], MD  Multiple Vitamin (MULTIVITAMIN WITH MINERALS) TABS tablet Take 1 tablet by mouth daily.    [provider]  omeprazole (PRILOSEC) 20 MG capsule Take 1 capsule (20 mg total) by mouth daily. 08/11/21   Kennyth Arnold, FNP  pantoprazole (PROTONIX) 40 MG tablet Take 1 tablet (40 mg total) by mouth daily. 12/09/20   [name], MD  atenolol (TENORMIN) 50 MG tablet TAKE 1 TABLET EVERY DAY (NEED MD APPOINTMENT FOR REFILLS) Patient taking differently: Take 50 mg by mouth daily.  07/09/18 06/03/19  [name], MD      Allergies    Oxycodone and Tramadol    Review of Systems   Review of Systems  Unable to perform ROS: Dementia    Physical Exam Updated Vital Signs BP (!) 155/92   Pulse (!) 55   Temp 97.8 F (36.6 C)   Resp 14   Ht '5\' 8"'$  (1.727 m)   Wt 77.1 kg   SpO2 100%   BMI 25.84 kg/m  Physical Exam Vitals and nursing note reviewed.  Constitutional:      General: He is not in acute distress.    Appearance: He is well-developed.  HENT:     Head: Normocephalic and atraumatic.     Mouth/Throat:     Mouth: Mucous membranes are moist.  Eyes:     General:        Right eye: No discharge.        Left eye: No discharge.     Conjunctiva/sclera: Conjunctivae normal.  Neck:     Trachea: No tracheal deviation.  Cardiovascular:     Rate and Rhythm: Normal rate and regular rhythm.     Heart sounds: No murmur heard. Pulmonary:     Effort: Pulmonary effort is normal.     Breath sounds: Normal breath sounds.  Abdominal:     General: There is no distension.     Palpations: Abdomen is soft.     Tenderness: There is no abdominal tenderness. There is no guarding.  Musculoskeletal:     Cervical back: Normal range of motion and neck  supple. No rigidity.     Right lower leg: No edema.     Left lower leg: No edema.  Skin:    General: Skin is warm.     Capillary Refill: Capillary refill takes less than 2 seconds.     Findings: No rash.  Neurological:     General: No focal deficit present.     Mental Status: He is alert.     Cranial Nerves: No cranial nerve deficit.  Psychiatric:     Comments: Pleasant dementia     ED Results / Procedures / Treatments   Labs (all labs ordered are listed, but only abnormal results are displayed) Labs Reviewed  COMPREHENSIVE METABOLIC PANEL - Abnormal; Notable for the following components:      Result Value   Glucose, Bld 102 (*)    Total Protein 6.3 (*)    Anion gap 4 (*)    All other components within normal limits  CBC WITH DIFFERENTIAL/PLATELET - Abnormal; Notable for the following components:   Hemoglobin 12.7 (*)    HCT 35.8 (*)    All other components within normal limits    EKG EKG Interpretation  Date/Time:  Monday October 09 2021 12:29:38 EDT Ventricular Rate:  81 PR Interval:  134 QRS Duration: 90 QT Interval:  408 QTC Calculation: 473 R Axis:   26 Text Interpretation: Normal sinus rhythm Nonspecific T wave abnormality Abnormal ECG When compared with ECG of 24-Jun-2021 18:50, PREVIOUS ECG IS PRESENT Confirmed by Elnora Morrison [phone]) on 10/09/2021 7:55:09 PM  Radiology DG Chest 1 View  Result Date: 10/09/2021 CLINICAL DATA:  Dizziness. EXAM: CHEST  1 VIEW COMPARISON:  August 11, 2021. FINDINGS: The heart size and mediastinal contours are within normal limits. Both lungs are clear. The visualized skeletal structures are unremarkable. IMPRESSION: No active disease. Electronically Signed   By: Marijo Conception M.D.   On: 10/09/2021 13:28    Procedures Procedures    Medications Ordered in ED Medications - No data to display  ED Course/ Medical Decision Making/ A&P                           Medical Decision Making  Patient presents with mild shortness of  breath and known COVID diagnosis.  Patient low risk for blood clots, normal oxygen, no tachycardia, normal work of breathing.  Patient well-hydrated on exam.  Blood work ordered and reviewed normal  white count, normal hemoglobin, compensating well with normal electrolytes and kidney function.  Patient pleasant in the room and not agitated, not no worsening dementia acutely.  Discussed no indication for admission emergently but patient will need close outpatient follow-up.  Patient's wife concern for intermittent agitation and nightmares in the evening.  Patient's wife is looking into other CPAP options that patient will tolerate as with his gradually worsening dementia and not tolerating it well.  No cyanosis or apnea episodes at home.  EKG reviewed no acute finding, chest x-ray reviewed and clear.        Final Clinical Impression(s) / ED Diagnoses Final diagnoses:  Pneumonia due to COVID-19 virus  Mild vascular dementia with mood disturbance [facility])    Rx / DC Orders ED Discharge Orders     None         Elnora Morrison, MD 10/10/21 2304

## 2021-10-10 NOTE — Telephone Encounter (Signed)
He was seen in the ED yesterday. Set up a follow up OV with me later this week (30 minutes)

## 2021-10-10 NOTE — Telephone Encounter (Signed)
Spoke with pt wife [name].   Pt wife to call office  and schedule 30 min ED follow up visit with [name].  See message from [name].

## 2021-10-12 ENCOUNTER — Encounter: Payer: Self-pay | Admitting: Family Medicine

## 2021-10-12 ENCOUNTER — Ambulatory Visit (INDEPENDENT_AMBULATORY_CARE_PROVIDER_SITE_OTHER): Payer: Medicare Other | Admitting: Family Medicine

## 2021-10-12 VITALS — BP 110/64 | HR 61 | Temp 97.7°F | Wt 170.4 lb

## 2021-10-12 DIAGNOSIS — F02818 Dementia in other diseases classified elsewhere, unspecified severity, with other behavioral disturbance: Secondary | ICD-10-CM

## 2021-10-12 DIAGNOSIS — J988 Other specified respiratory disorders: Secondary | ICD-10-CM | POA: Diagnosis not present

## 2021-10-12 DIAGNOSIS — G301 Alzheimer's disease with late onset: Secondary | ICD-10-CM

## 2021-10-12 DIAGNOSIS — U071 COVID-19: Secondary | ICD-10-CM | POA: Diagnosis not present

## 2021-10-12 MED ORDER — ALBUTEROL SULFATE HFA 108 (90 BASE) MCG/ACT IN AERS
2.0000 | INHALATION_SPRAY | RESPIRATORY_TRACT | 11 refills | Status: DC | PRN
Start: 2021-10-12 — End: 2021-10-16

## 2021-10-12 MED ORDER — RISPERIDONE 0.5 MG PO TABS: 0.5000 mg | ORAL_TABLET | Freq: Every day | ORAL | 2 refills | Status: DC

## 2021-10-12 NOTE — Progress Notes (Signed)
   Subjective:    Patient ID: Eric Richmond, male    DOB: [birth year], 81 y.o.   MRN: [id number]  HPI Here with his wife to follow up an ED visit on 10-09-21. He had a virtual visit with [place] on 10-04-21 after both of them tested positive for Covid at home, and he was treated with 5 days of Molnupiravir. Then he went to the ED with a cough and SOB. His exam was normal, and labs were normal including a WBC of 5.2. Oxygen sat on RA was 100%. He was told he had a viral pneumonia, and no further medications were prescribed. Since then he has improved with almost no coughing. He still gets slightly SOB when he walks for a distance, but not at rest. The other issue his wife wants to discuss is the fact that he gets restless and more confused every evening before he goes to bed. He is not agitated but he does not want to get in bed at times and he resists wearing his CPAP mask.    Review of Systems  Constitutional:  Positive for fatigue. Negative for fever.  Respiratory:  Positive for shortness of breath. Negative for cough, choking and wheezing.   Cardiovascular: Negative.   Psychiatric/Behavioral:  Positive for confusion. Negative for agitation, behavioral problems, decreased concentration, dysphoric mood and hallucinations. The patient is nervous/anxious.        Objective:   Physical Exam Constitutional:      Appearance: Normal appearance. He is not ill-appearing.  Cardiovascular:     Rate and Rhythm: Normal rate and regular rhythm.     Pulses: Normal pulses.     Heart sounds: Normal heart sounds.  Pulmonary:     Effort: Pulmonary effort is normal.     Breath sounds: Normal breath sounds.  Neurological:     Mental Status: He is alert. Mental status is at baseline.  Psychiatric:        Mood and Affect: Mood normal.        Behavior: Behavior normal.           Assessment & Plan:  He is recovering from a viral pneumonia due to the Covid virus. He can use his inhaler as needed. His strength  will return to normal over the next few weeks. He is also showing some sundowning behaviors which is typical for dementia patients. He will try taking Resperidone 0.5 mg about 30 minutes before going to bed each evening. They will report back in  2 weeks. We spent a total of ( 34  ) minutes reviewing records and discussing these issues.  Alysia Penna, MD

## 2021-10-13 ENCOUNTER — Telehealth: Payer: Self-pay

## 2021-10-13 NOTE — Telephone Encounter (Signed)
Pharmacy message sent too Dr Sarajane Jews for advise

## 2021-10-13 NOTE — Telephone Encounter (Signed)
Please change this to Proair, same directions

## 2021-10-16 ENCOUNTER — Other Ambulatory Visit: Payer: Self-pay

## 2021-10-16 DIAGNOSIS — H0288A Meibomian gland dysfunction right eye, upper and lower eyelids: Secondary | ICD-10-CM | POA: Diagnosis not present

## 2021-10-16 DIAGNOSIS — H353111 Nonexudative age-related macular degeneration, right eye, early dry stage: Secondary | ICD-10-CM | POA: Diagnosis not present

## 2021-10-16 DIAGNOSIS — H40023 Open angle with borderline findings, high risk, bilateral: Secondary | ICD-10-CM | POA: Diagnosis not present

## 2021-10-16 DIAGNOSIS — Z961 Presence of intraocular lens: Secondary | ICD-10-CM | POA: Diagnosis not present

## 2021-10-16 DIAGNOSIS — H0288B Meibomian gland dysfunction left eye, upper and lower eyelids: Secondary | ICD-10-CM | POA: Diagnosis not present

## 2021-10-16 MED ORDER — ALBUTEROL SULFATE HFA 108 (90 BASE) MCG/ACT IN AERS: 2.0000 | INHALATION_SPRAY | RESPIRATORY_TRACT | 5 refills | Status: DC | PRN

## 2021-10-16 NOTE — Telephone Encounter (Signed)
Pt Rx was sent to his pharmacy

## 2021-10-18 ENCOUNTER — Ambulatory Visit: Payer: Medicare Other | Admitting: Physician Assistant

## 2021-10-18 ENCOUNTER — Encounter: Payer: Self-pay | Admitting: Physician Assistant

## 2021-10-18 VITALS — BP 103/67 | HR 69 | Resp 20 | Ht 68.0 in | Wt 177.0 lb

## 2021-10-18 DIAGNOSIS — R413 Other amnesia: Secondary | ICD-10-CM

## 2021-10-18 DIAGNOSIS — F02818 Dementia in other diseases classified elsewhere, unspecified severity, with other behavioral disturbance: Secondary | ICD-10-CM | POA: Diagnosis not present

## 2021-10-18 DIAGNOSIS — G301 Alzheimer's disease with late onset: Secondary | ICD-10-CM | POA: Diagnosis not present

## 2021-10-18 DIAGNOSIS — R4182 Altered mental status, unspecified: Secondary | ICD-10-CM

## 2021-10-18 NOTE — Patient Instructions (Signed)
It was a pleasure to see you today at our office.   Recommendations:  Follow up in 6  months Continue Memantine 10 mg twice daily.Side effects were discussed  EEG   Whom to call:  Memory  decline, memory medications: Call our office [phone]   For psychiatric meds, mood meds: Please have your primary care physician manage these medications.   Counseling regarding caregiver distress, including caregiver depression, anxiety and issues regarding community resources, adult day care programs, adult living facilities, or memory care questions:   Feel free to contact [place], Social Worker at [phone]   For assessment of decision of mental capacity and competency:  Call Dr. Anthoney Harada, geriatric psychiatrist at [phone]  For guidance in geriatric dementia issues please call [facility] [phone]  For guidance regarding [facility] and if placement were needed at the facility, contact Arnell Asal, Social Worker tel: [phone]  If you have any severe symptoms of a stroke, or other severe issues such as confusion,severe chills or fever, etc call 911 or go to the ER as you may need to be evaluated further   Feel free to visit Facebook page " [email or web address]" for tips of how to care for people with memory problems.        RECOMMENDATIONS FOR ALL PATIENTS WITH MEMORY PROBLEMS: 1. Continue to exercise (Recommend 30 minutes of walking everyday, or 3 hours every week) 2. Increase social interactions - continue going to [place] and enjoy social gatherings with friends and family 3. Eat healthy, avoid fried foods and eat more fruits and vegetables 4. Maintain adequate blood pressure, blood sugar, and blood cholesterol level. Reducing the risk of stroke and cardiovascular disease also helps promoting better memory. 5. Avoid stressful situations. Live a simple life and avoid aggravations. Organize your time and prepare for the next day in  anticipation. 6. Sleep well, avoid any interruptions of sleep and avoid any distractions in the bedroom that may interfere with adequate sleep quality 7. Avoid sugar, avoid sweets as there is a strong link between excessive sugar intake, diabetes, and cognitive impairment We discussed the Mediterranean diet, which has been shown to help patients reduce the risk of progressive memory disorders and reduces cardiovascular risk. This includes eating fish, eat fruits and green leafy vegetables, nuts like almonds and hazelnuts, walnuts, and also use olive oil. Avoid fast foods and fried foods as much as possible. Avoid sweets and sugar as sugar use has been linked to worsening of memory function.  There is always a concern of gradual progression of memory problems. If this is the case, then we may need to adjust level of care according to patient needs. Support, both to the patient and caregiver, should then be put into place.    The Alzheimer's Association is here all day, every day for people facing Alzheimer's disease through our free 24/7 Helpline: [phone]. The Helpline provides reliable information and support to all those who need assistance, such as individuals living with memory loss, Alzheimer's or other dementia, caregivers, health care professionals and the public.  Our highly trained and knowledgeable staff can help you with: Understanding memory loss, dementia and Alzheimer's  Medications and other treatment options  General information about aging and brain health  Skills to provide quality care and to find the best care from professionals  Legal, financial and living-arrangement decisions Our Helpline also features: Confidential care consultation provided by master's level clinicians who can help with decision-making support, crisis assistance and  education on issues families face every day  Help in a caller's preferred language using our translation service that features more than 200  languages and dialects  Referrals to local community programs, services and ongoing support     FALL PRECAUTIONS: Be cautious when walking. Scan the area for obstacles that may increase the risk of trips and falls. When getting up in the mornings, sit up at the edge of the bed for a few minutes before getting out of bed. Consider elevating the bed at the head end to avoid drop of blood pressure when getting up. Walk always in a well-lit room (use night lights in the walls). Avoid area rugs or power cords from appliances in the middle of the walkways. Use a walker or a cane if necessary and consider physical therapy for balance exercise. Get your eyesight checked regularly.  FINANCIAL OVERSIGHT: Supervision, especially oversight when making financial decisions or transactions is also recommended.  HOME SAFETY: Consider the safety of the kitchen when operating appliances like stoves, microwave oven, and blender. Consider having supervision and share cooking responsibilities until no longer able to participate in those. Accidents with firearms and other hazards in the house should be identified and addressed as well.   ABILITY TO BE LEFT ALONE: If patient is unable to contact 911 operator, consider using LifeLine, or when the need is there, arrange for someone to stay with patients. Smoking is a fire hazard, consider supervision or cessation. Risk of wandering should be assessed by caregiver and if detected at any point, supervision and safe proof recommendations should be instituted.  MEDICATION SUPERVISION: Inability to self-administer medication needs to be constantly addressed. Implement a mechanism to ensure safe administration of the medications.   DRIVING: Regarding driving, in patients with progressive memory problems, driving will be impaired. We advise to have someone else do the driving if trouble finding directions or if minor accidents are reported. Independent driving assessment is  available to determine safety of driving.   If you are interested in the driving assessment, you can contact the following:  [employer] in [place]  [place] [phone] or [phone]      [place] refers to food and lifestyle choices that are based on the traditions of countries located on the [employer]. This way of eating has been shown to help prevent certain conditions and improve outcomes for people who have chronic diseases, like kidney disease and heart disease. What are tips for following this plan? Lifestyle  Cook and eat meals together with your family, when possible. Drink enough fluid to keep your urine clear or pale yellow. Be physically active every day. This includes: Aerobic exercise like running or swimming. Leisure activities like gardening, walking, or housework. Get 7-8 hours of sleep each night. If recommended by your health care provider, drink red wine in moderation. This means 1 glass a day for nonpregnant women and 2 glasses a day for men. A glass of wine equals 5 oz (150 mL). Reading food labels  Check the serving size of packaged foods. For foods such as rice and pasta, the serving size refers to the amount of cooked product, not dry. Check the total fat in packaged foods. Avoid foods that have saturated fat or trans fats. Check the ingredients list for added sugars, such as corn syrup. Shopping  At the grocery store, buy most of your food from the areas  near the walls of the store. This includes: Fresh fruits and vegetables (produce). Grains, beans, nuts, and seeds. Some of these may be available in unpackaged forms or large amounts (in bulk). Fresh seafood. Poultry and eggs. Low-fat dairy products. Buy whole ingredients instead of prepackaged foods. Buy fresh fruits and vegetables in-season  from local farmers markets. Buy frozen fruits and vegetables in resealable bags. If you do not have access to quality fresh seafood, buy precooked frozen shrimp or canned fish, such as tuna, salmon, or sardines. Buy small amounts of raw or cooked vegetables, salads, or olives from the deli or salad bar at your store. Stock your pantry so you always have certain foods on hand, such as olive oil, canned tuna, canned tomatoes, rice, pasta, and beans. Cooking  Cook foods with extra-virgin olive oil instead of using butter or other vegetable oils. Have meat as a side dish, and have vegetables or grains as your main dish. This means having meat in small portions or adding small amounts of meat to foods like pasta or stew. Use beans or vegetables instead of meat in common dishes like chili or lasagna. Experiment with different cooking methods. Try roasting or broiling vegetables instead of steaming or sauteing them. Add frozen vegetables to soups, stews, pasta, or rice. Add nuts or seeds for added healthy fat at each meal. You can add these to yogurt, salads, or vegetable dishes. Marinate fish or vegetables using olive oil, lemon juice, garlic, and fresh herbs. Meal planning  Plan to eat 1 vegetarian meal one day each week. Try to work up to 2 vegetarian meals, if possible. Eat seafood 2 or more times a week. Have healthy snacks readily available, such as: Vegetable sticks with hummus. Greek yogurt. Fruit and nut trail mix. Eat balanced meals throughout the week. This includes: Fruit: 2-3 servings a day Vegetables: 4-5 servings a day Low-fat dairy: 2 servings a day Fish, poultry, or lean meat: 1 serving a day Beans and legumes: 2 or more servings a week Nuts and seeds: 1-2 servings a day Whole grains: 6-8 servings a day Extra-virgin olive oil: 3-4 servings a day Limit red meat and sweets to only a few servings a month What are my food choices? Mediterranean diet Recommended Grains:  Whole-grain pasta. Brown rice. Bulgar wheat. Polenta. Couscous. Whole-wheat bread. [place]. Vegetables: Artichokes. Beets. Broccoli. Cabbage. Carrots. Eggplant. Green beans. Chard. Kale. Spinach. Onions. Leeks. Peas. Squash. Tomatoes. Peppers. Radishes. Fruits: Apples. Apricots. Avocado. Berries. Bananas. Cherries. Dates. Figs. Grapes. Lemons. Melon. Oranges. Peaches. Plums. Pomegranate. Meats and other protein foods: Beans. Almonds. Sunflower seeds. Pine nuts. Peanuts. [place]. Salmon. Scallops. Shrimp. [place]. Tilapia. Clams. Oysters. Eggs. Dairy: Low-fat milk. Cheese. Greek yogurt. Beverages: Water. Red wine. Herbal tea. Fats and oils: Extra virgin olive oil. Avocado oil. Grape seed oil. Sweets and desserts: [place] yogurt with honey. Baked apples. Poached pears. Trail mix. Seasoning and other foods: Basil. Cilantro. Coriander. Cumin. Mint. Parsley. Sage. Rosemary. Tarragon. Garlic. Oregano. Thyme. Pepper. Balsalmic vinegar. Tahini. Hummus. Tomato sauce. Olives. Mushrooms. Limit these Grains: Prepackaged pasta or rice dishes. Prepackaged cereal with added sugar. Vegetables: Deep fried potatoes (french fries). Fruits: Fruit canned in syrup. Meats and other protein foods: Beef. Pork. Lamb. Poultry with skin. Hot dogs. [place] Salines. Dairy: Ice cream. Sour cream. Whole milk. Beverages: Juice. Sugar-sweetened soft drinks. Beer. Liquor and spirits. Fats and oils: Butter. Canola oil. Vegetable oil. Beef fat (tallow). Lard. Sweets and desserts: Cookies. Cakes. Pies. Candy. Seasoning and other foods: Mayonnaise. Premade sauces and marinades.  The items listed may not be a complete list. Talk with your dietitian about what dietary choices are right for you. Summary The Mediterranean diet includes both food and lifestyle choices. Eat a variety of fresh fruits and vegetables, beans, nuts, seeds, and whole grains. Limit the amount of red meat and sweets that you eat. Talk with your health care provider about  whether it is safe for you to drink red wine in moderation. This means 1 glass a day for nonpregnant women and 2 glasses a day for men. A glass of wine equals 5 oz (150 mL). This information is not intended to replace advice given to you by your health care provider. Make sure you discuss any questions you have with your health care provider. Document Released: 10/06/2015 Document Revised: 11/08/2015 Document Reviewed: 10/06/2015 Elsevier Interactive Patient Education  2017 [employer].

## 2021-10-18 NOTE — Progress Notes (Signed)
Assessment/Plan:   Dementia likely due to Alzheimer disease Eric Richmond is a very pleasant 81 y.o. 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 Patient is currently on memantine 10 mg twice daily.  Last MMSE February 2023 was 14/30.  He is also on Lexapro 20 mg daily for mood, tolerating well.  Although somewhat stable from the cognitive standpoint, the patient has not been following up with pulmonary, and is due for evaluation of CPAP, which may be contributing to some of his cognitive difficulties.  His wife also states that he may have been experiencing some jerking episodes while in bed, but with CPAP they improved.  She is not quite sure that this is related to epilepsy.  Thus, recommendations are as follows.:    Follow up in  6 months. EEG to rule out seizure Continue memantine 10 mg twice a day  Recommend to check with Pulmonary for CPAP    Subjective:    This patient is accompanied in the office by  who supplements the history.  Previous records as well as any outside records available were reviewed prior to todays visit.    Any changes in memory since last visit?  "Not as good but he had some other medical problem ".  He had recent COVID requiring evaluation at the emergency room.  During the day he likes to play pool.he met his [place] [facility] [employer] increase their activity level. Patient lives with: Wife  repeats oneself?  Endorsed Disoriented when walking into a room?  One time episode he thought he was at work instead of being at home. Has trouble understanding instructions  Leaving objects in unusual places?  Endorsed, for example, he left the remote control in the bathroom  Ambulates  with difficulty?   Patient denies   Recent falls?  Occasional mechanical fall  Any head injuries?  Patient denies   History of seizures?  He denies any prior history of seizures, however,  his wife reports that while not on CPAP, he had episodes where "the whole bed was shaking and lasted 1 minute ".  No tongue biting, no increased elevation, no postictal state, no muscle aches thereafter.  He was asleep when this happened.  No vertigo, he has a history of some dizziness related to orthostatic hypotension, but this is chronic. Wandering behavior?  Patient denies   Patient drives?   Patient no longer drives  Any mood changes since last visit?  Patient denies   Any worsening depression?:  Patient denies   Hallucinations?  "Talks to people all along " Paranoia?  Patient denies   Patient reports that sleeps well with vivid dreams and REM behavior, denies sleepwalking   History of sleep apnea? Endorsed "needs to restart CPAP "-his wife says Any hygiene concerns?  Patient denies   Independent of bathing and dressing?  Endorsed  Does the patient needs help with medications?  Wife manages "he never remembers" Who is in charge of the finances?  Wife  is in charge    Any changes in appetite?  "Decreased appetite because he forgets to eat , and does not drink enough water " Patient have trouble swallowing? Patient denies   Does the patient cook?  Patient denies   Any kitchen accidents such as leaving the stove on? Patient denies   Any headaches?  Patient denies   Double vision?  He  had a recent evaluation by ophthalmology, wife reports that on the right eye he may need further investigation, he may have some macular degeneration versus other abnormality. Any focal numbness or tingling?  Patient denies   Chronic back pain Patient denies   Unilateral weakness?  Patient denies   Any tremors?  Patient denies   Any history of anosmia?  Patient denies   Any incontinence of urine?  Patient denies   Any bowel dysfunction?   Patient denies           History on Initial Assessment 04/25/2018: This is a very pleasant 81 year old right-handed man with a history of hypertension, sleep apnea on CPAP,  presenting for evaluation of worsening memory. He states his memory is not as good as it had been. His wife started noticing memory changes around 6 months ago, noting that he is very forgetful. He forgets where he puts things or does not remember the meal he ate prior. She feels his forgetfulness is just more than usual. He occasionally repeats himself. They both agree that he is good with managing his own medications, he rarely forgets them. 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 He denies any difficulties with driving and work. He got lost briefly a week ago in an unfamiliar road, he got "a little excited" but was able to find his way back within 5 minutes. His wife denies any other driving concerns. Wife manages finances. He is independent with dressing and bathing. His wife has noticed he is a little more stressed out and "gets really tight" when he cannot find something. No paranoia or hallucinations. Sleep is good with CPAP, no wandering behavior.   He has frontal headaches when using a machine with smoke coming out. He has occasional dizziness when standing. He has occasional back pain from a pinched nerve. He has had a decreased sense of smell for some time. No diplopia, dysarthria/dysphagia, neck pain, focal numbness/tingling/weakness, bowel/bladder dysfunction, tremors. His father had memory issues. He occasionally drinks a little wine. No significant head injuries.  Denies headaches, double vision, dizziness, focal numbness or tingling, unilateral weakness, tremors or anosmia. No history of seizures. Denies urine incontinence, retention, constipation or diarrhea.     PREVIOUS MEDICATIONS:   CURRENT MEDICATIONS:  Outpatient Encounter Medications as of 10/18/2021  Medication Sig   albuterol (PROAIR HFA) 108 (90 Base) MCG/ACT inhaler Inhale 2 puffs into the lungs every 4 (four) hours as needed for wheezing or shortness of breath.    amLODipine (NORVASC) 10 MG tablet Take 0.5 tablets (5 mg total) by mouth daily.   ascorbic acid (VITAMIN C) 500 MG tablet Take 1 tablet (500 mg total) by mouth daily.   aspirin EC 81 MG tablet Take 81 mg by mouth daily.   diazepam (VALIUM) 2 MG tablet Take 1 tablet (2 mg total) by mouth every 8 (eight) hours as needed (dizziness).   docusate sodium (COLACE) 100 MG capsule Take 1 capsule (100 mg total) by mouth 2 (two) times daily.   escitalopram (LEXAPRO) 20 MG tablet Take 1 tablet (20 mg total) by mouth daily.   fluticasone (FLONASE) 50 MCG/ACT nasal spray Place 2 sprays into both nostrils daily.   furosemide (LASIX) 20 MG tablet Take 1 tablet (20 mg total) by mouth daily.   losartan-hydrochlorothiazide (HYZAAR) 50-12.5 MG tablet Take 1 tablet by mouth every evening.   memantine (NAMENDA) 10 MG tablet Take 1 tablet (10 mg total)  by mouth 2 (two) times daily.   Multiple Vitamin (MULTIVITAMIN WITH MINERALS) TABS tablet Take 1 tablet by mouth daily.   omeprazole (PRILOSEC) 20 MG capsule Take 1 capsule (20 mg total) by mouth daily.   pantoprazole (PROTONIX) 40 MG tablet Take 1 tablet (40 mg total) by mouth daily.   risperiDONE (RISPERDAL) 0.5 MG tablet Take 1 tablet (0.5 mg total) by mouth at bedtime.   [DISCONTINUED] atenolol (TENORMIN) 50 MG tablet TAKE 1 TABLET EVERY DAY (NEED MD APPOINTMENT FOR REFILLS) (Patient taking differently: Take 50 mg by mouth daily. )   No facility-administered encounter medications on file as of 10/18/2021.       04/07/2021   11:00 AM 10/05/2020    8:00 AM 08/13/2017    5:04 PM  MMSE - Mini Mental State Exam  Orientation to time 0 3 4  Orientation to Place 2 5 5   Registration 3 3 3   Attention/ Calculation 0 2 4  Recall 3 0 2  Language- name 2 objects 2 2 2   Language- repeat 0 1 1  Language- follow 3 step command 3 1 3   Language- read & follow direction 1 1 1   Write a sentence 0 0 1  Copy design 0 0 0  Total score 14 18 26       12/02/2018   11:00 AM  04/25/2018    9:00 AM  Montreal Cognitive Assessment   Visuospatial/ Executive (0/5) 1 1  Naming (0/3) 2 3  Attention: Read list of digits (0/2) 1 1  Attention: Read list of letters (0/1) 1 1  Attention: Serial 7 subtraction starting at 100 (0/3) 2 1  Language: Repeat phrase (0/2) 1 1  Language : Fluency (0/1) 0 0  Abstraction (0/2) 0 0  Delayed Recall (0/5) 0 0  Orientation (0/6) 4 2  Total 12 10  Adjusted Score (based on education) 13 11    Objective:     PHYSICAL EXAMINATION:    VITALS:   Vitals:   10/18/21 0929  BP: 103/67  Pulse: 69  Resp: 20  SpO2: 95%  Weight: 177 lb (80.3 kg)  Height: 5' 8"  (1.727 m)    GEN:  The patient appears stated age and is in NAD. HEENT:  Normocephalic, atraumatic.   Neurological examination:  General: NAD, well-groomed, appears stated age. Orientation: The patient is alert. Oriented to person, place and date Cranial nerves: There is good facial symmetry.The speech is fluent and clear. No aphasia or dysarthria. Fund of knowledge is appropriate. Recent and remote memory are impaired. Attention and concentration are reduced.  Able to name objects and repeat phrases.  Hearing is intact to conversational tone.    Sensation: Sensation is intact to light touch throughout Motor: Strength is at least antigravity x4. Tremors: none  DTR's 2/4 in UE/LE     Movement examination: Tone: There is normal tone in the UE/LE Abnormal movements:  no tremor.  No myoclonus.  No asterixis.   Coordination:  There is no decremation with RAM's. Normal finger to nose  Gait and Station: The patient has no difficulty arising out of a deep-seated chair without the use of the hands. The patient's stride length is good.  Gait is cautious and narrow.    Thank you for allowing [place] the opportunity to participate in the care of this nice patient. Please do not hesitate to contact us for any questions or concerns.   Total time spent on today's visit was 30 minutes  dedicated to this patient  today, preparing to see patient, examining the patient, ordering tests and/or medications and counseling the patient, documenting clinical information in the EHR or other health record, independently interpreting results and communicating results to the patient/family, discussing treatment and goals, answering patient's questions and coordinating care.  Cc:  [name], MD  Sharene Butters 10/18/2021 9:37 AM

## 2021-10-19 ENCOUNTER — Telehealth: Payer: Self-pay | Admitting: Family Medicine

## 2021-10-19 NOTE — Telephone Encounter (Signed)
Sleep study test- 09/12/21

## 2021-10-19 NOTE — Telephone Encounter (Signed)
Pt wife is calling and would like sleep study results

## 2021-10-20 NOTE — Telephone Encounter (Signed)
Sleep study test was ordered on 08/11/21 by Dutch Quint.    Sleep study complete documents scanned in on 09/14/21.   Please advise

## 2021-10-20 NOTE — Telephone Encounter (Signed)
First, the results are not back yet. Second, they need to ask Dr. Annamaria Boots (who ordered the test)

## 2021-10-31 ENCOUNTER — Ambulatory Visit: Payer: Self-pay | Admitting: *Deleted

## 2021-10-31 ENCOUNTER — Telehealth: Payer: Self-pay

## 2021-10-31 DIAGNOSIS — F0280 Dementia in other diseases classified elsewhere without behavioral disturbance: Secondary | ICD-10-CM

## 2021-10-31 NOTE — Telephone Encounter (Signed)
   Telephone encounter was:  Unsuccessful.  10/31/2021 Name: DAHIR AYER MRN: [id number] DOB: 08/04/1940  Unsuccessful outbound call made today to assist with:   life alert  Outreach Attempt:  1st Attempt Could not leave a message   [place]  [phone] [address], [place], [place] [address] Phone: [phone] Email: [email or web address]

## 2021-10-31 NOTE — Telephone Encounter (Signed)
I do not interpret these tests. I would have them follow up with Dr. Annamaria Boots, who has worked with them in the past

## 2021-10-31 NOTE — Patient Outreach (Signed)
  Care Coordination   Follow Up Visit Note   10/31/2021 Name: Eric Richmond MRN: [id number] DOB: [birth year]  Eric Richmond is a 81 y.o. year old male who sees [name], MD for primary care. I  spoke with the spouse Eric Richmond.  What matters to the patients health and wellness today?  Alert agencies, sitter services, GI consult pending    Goals Addressed               This Visit's Progress     COMPLETED: Emergent alert agencies, Sitter services, pending GI consult has not occurred (pt-stated)        Care Coordination Interventions: Collaborated with [name] regarding pending GI consult referral. Pt has not received a call to scheduled an appointment and requested RN to follow up with Dr. [name] office. RN has communicated (in-basket) with primary provider and inquired. Epic notes indicate  Care Guide referral for medical alert agencies and sitter services for overnight services when needed.        COMPLETED: Rectal pain (pt-stated)        Care Coordination Interventions: Reviewed medications with patient and discussed purpose of all medications Reviewed scheduled/upcoming provider appointments including All pending appointments and pt has an appointment today scheduled with PCP. Requested spouse to inquired on an appointment for an AWV. Advised patient to discuss rectal pain with provider Screening for signs and symptoms of depression related to chronic disease state  Assessed social determinant of health barriers         SDOH assessments and interventions completed:  No     Care Coordination Interventions Activated:  Yes  Care Coordination Interventions:  Yes, provided   Follow up plan: No further intervention required.   Encounter Outcome:  Pt. Visit Completed   Raina Mina, RN Care Management Coordinator [place] Office [phone]

## 2021-10-31 NOTE — Patient Instructions (Signed)
Visit Information  Thank you for taking time to visit with me today. Please don't hesitate to contact me if I can be of assistance to you.   Following are the goals we discussed today:   Goals Addressed               This Visit's Progress     COMPLETED: Emergent alert agencies, Sitter services, pending GI consult has not occurred (pt-stated)        Care Coordination Interventions: Collaborated with [name] regarding pending GI consult referral. Pt has not received a call to scheduled an appointment and requested RN to follow up with Dr. [name] office. RN has communicated (in-basket) with primary provider and inquired. Epic notes indicate  Care Guide referral for medical alert agencies and sitter services for overnight services when needed.        COMPLETED: Rectal pain (pt-stated)        Care Coordination Interventions: Reviewed medications with patient and discussed purpose of all medications Reviewed scheduled/upcoming provider appointments including All pending appointments and pt has an appointment today scheduled with PCP. Requested spouse to inquired on an appointment for an AWV. Advised patient to discuss rectal pain with provider Screening for signs and symptoms of depression related to chronic disease state  Assessed social determinant of health barriers        Please call the care guide team at [phone] if you need to cancel or reschedule your appointment.   If you are experiencing a Mental Health or [place] or need someone to talk to, please call the Suicide and Crisis Lifeline: 988  The patient verbalized understanding of instructions, educational materials, and care plan provided today and agreed to receive a mailed copy of patient instructions, educational materials, and care plan.   No further follow up required: No additional needs  Raina Mina, RN Care Management Coordinator [place] Office [phone]

## 2021-11-02 ENCOUNTER — Telehealth: Payer: Self-pay

## 2021-11-02 NOTE — Telephone Encounter (Signed)
   Telephone encounter was:  Successful.  11/02/2021 Name: Eric Richmond MRN: [id number] DOB: 08-Jan-1941  Eric Richmond is a 81 y.o. year old male who is a primary care patient of [name], MD . The community resource team was consulted for assistance with Caregiver Stress and life alert  Care guide performed the following interventions: Patient provided with information about care guide support team and interviewed to confirm resource needs.patients wife is needing resources for respite care, sitter and life alert which is not covered with [employer] benefits.  Follow Up Plan:  No further follow up planned at this time. The patient has been provided with needed resources.    [facility], Care Management  [phone] [address], [place], [place] [address] Phone: [phone] Email: [email or web address]

## 2021-11-02 NOTE — Telephone Encounter (Signed)
Called spoke with patient's wife Eric Richmond, message given voiced understanding.

## 2021-11-03 ENCOUNTER — Telehealth: Payer: Self-pay | Admitting: *Deleted

## 2021-11-03 ENCOUNTER — Encounter: Payer: Self-pay | Admitting: *Deleted

## 2021-11-03 ENCOUNTER — Telehealth: Payer: Self-pay

## 2021-11-03 NOTE — Telephone Encounter (Signed)
This encounter was created in error - please disregard.

## 2021-11-03 NOTE — Patient Instructions (Signed)
Visit Information  Thank you for taking time to visit with me today. Please don't hesitate to contact me if I can be of assistance to you.   Following are the goals we discussed today:   Goals Addressed               This Visit's Progress     COMPLETED: GI Consult (pt-stated)        Care Coordination Interventions: Collaborated with [name] regarding GI consult and provided a contact for [facility] [phone] to provide to the pt's spouse [name]. Spoke with spouse and relayed this information requested her to contact the office to schedule a consult.          Please call the care guide team at [phone] if you need to cancel or reschedule your appointment.   If you are experiencing a Mental Health or [place] or need someone to talk to, please call the Suicide and Crisis Lifeline: 988  The patient verbalized understanding of instructions, educational materials, and care plan provided today and DECLINED offer to receive copy of patient instructions, educational materials, and care plan.   No further follow up required: No additional needs  Raina Mina, RN Care Management Coordinator [place] Office [phone]

## 2021-11-03 NOTE — Patient Outreach (Signed)
  Care Coordination   Follow Up Visit Note   11/03/2021 Name: Eric Richmond MRN: [id number] DOB: 12-26-1940  Eric Richmond is a 81 y.o. year old male who sees [name], MD for primary care. I  spoke with the spouse Eric Richmond  What matters to the patients health and wellness today?  GI consult contact number    Goals Addressed               This Visit's Progress     COMPLETED: GI Consult (pt-stated)        Care Coordination Interventions: Collaborated with [name] regarding GI consult and provided a contact for GI  [phone] to provide to the pt's spouse Eric Richmond. Spoke with spouse and relayed this information requested her to contact the office to schedule a consult.         SDOH assessments and interventions completed:  No     Care Coordination Interventions Activated:  Yes  Care Coordination Interventions:  Yes, provided   Follow up plan: No further intervention required.   Encounter Outcome:  Pt. Visit Completed   Raina Mina, RN Care Management Coordinator [place] Office [phone]

## 2021-11-03 NOTE — Telephone Encounter (Signed)
   Mailed resources for life alert, independant living and respite care services   [facility], [facility]  [phone] [address], [place], [place] [address] Phone: [phone] Email: [email or web address]

## 2021-11-05 ENCOUNTER — Other Ambulatory Visit: Payer: Self-pay | Admitting: Family Medicine

## 2021-11-06 NOTE — Telephone Encounter (Signed)
Last OV- 10/12/21 Last refill- 04/10/21-90 tabs, 5 refills  No future OV scheduled

## 2021-11-08 ENCOUNTER — Telehealth: Payer: Self-pay | Admitting: Internal Medicine

## 2021-11-08 ENCOUNTER — Ambulatory Visit: Payer: Medicare Other | Admitting: Gastroenterology

## 2021-11-08 NOTE — Telephone Encounter (Signed)
Patient's wife called regarding sleep study results. States patient had a study done 2 months ago and no one called him.  Please advise- call back [phone].

## 2021-11-10 NOTE — Telephone Encounter (Signed)
ATC patient to information him of information from Dr Annamaria Boots. Will try again

## 2021-11-10 NOTE — Telephone Encounter (Signed)
Study results are sent to the ordering provider- apparently Dutch Quint, FNP. He needs to contact her for results. Happy to see patient here to manage if he wishes.

## 2021-11-10 NOTE — Telephone Encounter (Signed)
Patient is calling in regards to sleep study results  Please advise sir

## 2021-11-13 NOTE — Telephone Encounter (Signed)
Attempted to call patient again about sleep study results. Got no answer so I left voicemail for him to call office back. Closing encounter since this is the 2nd attempt to call patient. Nothing further needed

## 2021-11-21 ENCOUNTER — Encounter: Payer: Self-pay | Admitting: Gastroenterology

## 2021-11-21 ENCOUNTER — Telehealth: Payer: Self-pay | Admitting: Internal Medicine

## 2021-11-21 ENCOUNTER — Ambulatory Visit: Payer: Medicare Other | Admitting: Gastroenterology

## 2021-11-21 VITALS — BP 130/72 | HR 70 | Ht 68.0 in | Wt 183.1 lb

## 2021-11-21 DIAGNOSIS — R1013 Epigastric pain: Secondary | ICD-10-CM | POA: Diagnosis not present

## 2021-11-21 NOTE — Progress Notes (Signed)
11/21/2021 Eric Richmond [id number] [birth year]   HISTORY OF PRESENT ILLNESS: This is an 81 year old male who is a patient of Dr. Vena Rua.  He is here today with his wife for complaints of epigastric abdominal pain.  Looks like he ahs reported this dating back to at least February 2021 when he was last seen here.  His PPI was changed/increased.  Apparently he still complains about this frequently to his wife.  His PCP recommended possible EGD.  He is on pantoprazole 40 mg daily.  He does not give much history due to his dementia and being hard of hearing.  His wife reports that it does not necessarily seem to be related to eating or anything in particular.  He has not had any weight loss.  No nausea or vomiting.  Does not use NSAIDs.   Past Medical History:  Diagnosis Date   Allergy    Anal fissure 20 years ago   COVID-19 01/2019   Diabetes mellitus without complication ([place]) 7/82/9562   ED (erectile dysfunction)    Essential hypertension 10/22/2006   Qualifier: Diagnosis of  By: Sarajane Jews MD, Ishmael Holter    GERD (gastroesophageal reflux disease)    Hard of hearing    Mild neurocognitive disorder 01/06/2019   OSA (obstructive sleep apnea)    per Dr. Gwenette Greet , cpap -2.0   Osteoarthritis    Right knee and right thumb; s/p total knee replacement   Pneumonia 2015   Past Surgical History:  Procedure Laterality Date   ANAL FISSURE REPAIR     colonoscopy  04/10/2019   per Dr. Hilarie Fredrickson, adenomatous polyps removed, no repeats due to age   25 ARTHROSCOPY     left knee   KNEE CLOSED REDUCTION Left 01/13/2014   Procedure: CLOSED MANIPULATION LEFT KNEE;  Surgeon: Gearlean Alf, MD;  Location: [facility];  Service: Orthopedics;  Laterality: Left;   SURGERY AGE 33 OR 7 ON LEFT KNEE FOR A "GLAND BEHIND THE KNEE"     TOTAL KNEE ARTHROPLASTY Left 10/26/2013   Procedure: LEFT TOTAL KNEE ARTHROPLASTY;  Surgeon: Gearlean Alf, MD;  Location: [facility];  Service: Orthopedics;  Laterality: Left;    reports  that he quit smoking about 44 years ago. His smoking use included cigarettes. He has a 6.00 pack-year smoking history. He has quit using smokeless tobacco.  His smokeless tobacco use included chew. He reports that he does not drink alcohol and does not use drugs. family history includes Breast cancer in his sister; Diabetes in his brother, father, mother, sister, sister, and sister; Heart failure in his brother and brother; Hypertension in his brother, father, mother, sister, and another family member; Thyroid cancer in his sister. Allergies  Allergen Reactions   Oxycodone Other (See Comments)    Hallucinations    Tramadol Other (See Comments)    Hallucinations       Outpatient Encounter Medications as of 11/21/2021  Medication Sig   albuterol (PROAIR HFA) 108 (90 Base) MCG/ACT inhaler Inhale 2 puffs into the lungs every 4 (four) hours as needed for wheezing or shortness of breath.   amLODipine (NORVASC) 10 MG tablet Take 0.5 tablets (5 mg total) by mouth daily.   ascorbic acid (VITAMIN C) 500 MG tablet Take 1 tablet (500 mg total) by mouth daily.   aspirin EC 81 MG tablet Take 81 mg by mouth daily.   diazepam (VALIUM) 2 MG tablet TAKE 1 TABLET BY MOUTH EVERY 8 HOURS AS NEEDED FOR  DIZZINESS  docusate sodium (COLACE) 100 MG capsule Take 1 capsule (100 mg total) by mouth 2 (two) times daily.   escitalopram (LEXAPRO) 20 MG tablet Take 1 tablet (20 mg total) by mouth daily.   fluticasone (FLONASE) 50 MCG/ACT nasal spray Place 2 sprays into both nostrils daily.   furosemide (LASIX) 20 MG tablet Take 1 tablet (20 mg total) by mouth daily.   losartan-hydrochlorothiazide (HYZAAR) 50-12.5 MG tablet Take 1 tablet by mouth every evening.   memantine (NAMENDA) 10 MG tablet Take 1 tablet (10 mg total) by mouth 2 (two) times daily.   Multiple Vitamin (MULTIVITAMIN WITH MINERALS) TABS tablet Take 1 tablet by mouth daily.   omeprazole (PRILOSEC) 20 MG capsule Take 1 capsule (20 mg total) by mouth daily.    pantoprazole (PROTONIX) 40 MG tablet Take 1 tablet (40 mg total) by mouth daily.   risperiDONE (RISPERDAL) 0.5 MG tablet Take 1 tablet (0.5 mg total) by mouth at bedtime.   [DISCONTINUED] atenolol (TENORMIN) 50 MG tablet TAKE 1 TABLET EVERY DAY (NEED MD APPOINTMENT FOR REFILLS) (Patient taking differently: Take 50 mg by mouth daily. )   No facility-administered encounter medications on file as of 11/21/2021.     REVIEW OF SYSTEMS  : All other systems reviewed and negative except where noted in the History of Present Illness.   PHYSICAL EXAM: BP 130/72   Pulse 70   Ht '5\' 8"'$  (1.727 m)   Wt 183 lb 2 oz (83.1 kg)   BMI 27.84 kg/m  General: Well developed AA male in no acute distress Head: Normocephalic and atraumatic Eyes:  Sclerae anicteric, conjunctiva pink. Ears: HOH Lungs: Clear throughout to auscultation; no W/R/R. Heart: Regular rate and rhythm; no M/R/G. Abdomen: Soft, non-distended.  BS present.  Mild epigastric TTP. Musculoskeletal: Symmetrical with no gross deformities  Skin: No lesions on visible extremities Extremities: No edema  Neurological: Alert oriented x 4, grossly non-focal Psychological:  Alert and cooperative. Normal mood and affect  ASSESSMENT AND PLAN: *Epigastric abdominal pain: He has had this complaint on and off for about 2-1/2 years.  He cannot give much history due to his dementia and he is HOH.  He is on pantoprazole 40 mg daily.  His PCP had recommended possible EGD.  He has had no weight loss or other associated symptoms that he relays.  We will plan for EGD with Dr. Hilarie Fredrickson.  The risks, benefits, and alternatives to EGD were discussed with the patient and he consents to proceed.    CC:  [name], MD

## 2021-11-21 NOTE — Patient Instructions (Signed)
You have been scheduled for an endoscopy. Please follow written instructions given to you at your visit today. If you use inhalers (even only as needed), please bring them with you on the day of your procedure.   Due to recent changes in healthcare laws, you may see the results of your imaging and laboratory studies on MyChart before your provider has had a chance to review them.  We understand that in some cases there may be results that are confusing or concerning to you. Not all laboratory results come back in the same time frame and the provider may be waiting for multiple results in order to interpret others.  Please give us 48 hours in order for your provider to thoroughly review all the results before contacting the office for clarification of your results.    It was a pleasure to see you today!  Thank you for trusting me with your gastrointestinal care!     

## 2021-11-21 NOTE — Progress Notes (Signed)
Addendum: Reviewed and agree with assessment and management plan. Caydyn Sprung M, MD  

## 2021-11-23 DIAGNOSIS — G4733 Obstructive sleep apnea (adult) (pediatric): Secondary | ICD-10-CM | POA: Diagnosis not present

## 2021-11-24 NOTE — Telephone Encounter (Signed)
Spoke with pt's wife and informed her that to get the results of sleep study she must contact the office of the provider who ordered it. [name] stated understanding. Nothing further needed at this time.

## 2021-11-28 ENCOUNTER — Other Ambulatory Visit: Payer: Self-pay | Admitting: Family Medicine

## 2021-12-02 ENCOUNTER — Encounter: Payer: Self-pay | Admitting: Certified Registered Nurse Anesthetist

## 2021-12-04 ENCOUNTER — Ambulatory Visit (HOSPITAL_COMMUNITY)
Admission: RE | Admit: 2021-12-04 | Discharge: 2021-12-04 | Disposition: A | Discharge status: Home / Self Care | Payer: Medicare Other | Source: Ambulatory Visit | Attending: Physician Assistant | Admitting: Physician Assistant

## 2021-12-04 DIAGNOSIS — R4182 Altered mental status, unspecified: Secondary | ICD-10-CM

## 2021-12-04 DIAGNOSIS — R252 Cramp and spasm: Secondary | ICD-10-CM | POA: Diagnosis not present

## 2021-12-04 NOTE — Progress Notes (Signed)
EEG complete - results pending 

## 2021-12-06 ENCOUNTER — Ambulatory Visit (AMBULATORY_SURGERY_CENTER): Payer: Medicare Other | Admitting: Internal Medicine

## 2021-12-06 ENCOUNTER — Encounter: Payer: Self-pay | Admitting: Internal Medicine

## 2021-12-06 VITALS — BP 114/67 | HR 55 | Temp 97.3°F | Resp 18 | Ht 68.0 in | Wt 183.0 lb

## 2021-12-06 DIAGNOSIS — K2950 Unspecified chronic gastritis without bleeding: Secondary | ICD-10-CM

## 2021-12-06 DIAGNOSIS — R1013 Epigastric pain: Secondary | ICD-10-CM

## 2021-12-06 MED ORDER — SODIUM CHLORIDE 0.9 % IV SOLN: 500.0000 mL | Freq: Once | INTRAVENOUS | Status: DC

## 2021-12-06 NOTE — Progress Notes (Signed)
Pt's states no medical or surgical changes since previsit or office visit. 

## 2021-12-06 NOTE — Progress Notes (Signed)
Called to room to assist during endoscopic procedure.  Patient ID and intended procedure confirmed with present staff. Received instructions for my participation in the procedure from the performing physician.  

## 2021-12-06 NOTE — Procedures (Signed)
ELECTROENCEPHALOGRAM REPORT  Date of Study: 12/04/2021  Patient's Name: Eric Richmond MRN: [id number] Date of Birth: 1940/03/19  Referring Provider: Sharene Butters, PA-C  Clinical History: This is an 81 year old man with dementia with jerking movements. EEG for classification.  Medications: Norvasc, aspirin, Lexapro, Lasix, Hyzaar, Namenda, Risperdal  Technical Summary: A multichannel digital EEG recording measured by the international 10-20 system with electrodes applied with paste and impedances below 5000 ohms performed in our laboratory with EKG monitoring in an awake and asleep patient.  Hyperventilation and photic stimulation were performed.  The digital EEG was referentially recorded, reformatted, and digitally filtered in a variety of bipolar and referential montages for optimal display.    Description: The patient is awake and asleep during the recording.  During maximal wakefulness, there is a symmetric, medium voltage 9-10 Hz posterior dominant rhythm that attenuates with eye opening.  The record is symmetric.  During drowsiness and sleep, there is an increase in theta slowing of the background with vertex waves seen. Photic stimulation did not elicit any abnormalities.  There were no epileptiform discharges or electrographic seizures seen.    EKG lead was unremarkable.  Impression: This awake and asleep EEG is normal.    Clinical Correlation: A normal EEG does not exclude a clinical diagnosis of epilepsy. Body jerking was not captured. If further clinical questions remain, prolonged EEG may be helpful.  Clinical correlation is advised.   Ellouise Newer, M.D.

## 2021-12-06 NOTE — Progress Notes (Signed)
See office note dated 11/21/2021 for details and current H&P  Patient presenting for upper endoscopy to evaluate epigastric abdominal pain despite daily PPI.  He remains appropriate for upper endoscopy here today.

## 2021-12-06 NOTE — Patient Instructions (Signed)
Continue present medications. Await pathology results.   If epigastric pain continues and pathology results are negative for H. Pylori infection then CT scan abdomen/pelvis with contrast is recommended to complete the evaluation of his upper abdominal pain.   YOU HAD AN ENDOSCOPIC PROCEDURE TODAY AT [facility]:   Refer to the procedure report that was given to you for any specific questions about what was found during the examination.  If the procedure report does not answer your questions, please call your gastroenterologist to clarify.  If you requested that your care partner not be given the details of your procedure findings, then the procedure report has been included in a sealed envelope for you to review at your convenience later.  YOU SHOULD EXPECT: Some feelings of bloating in the abdomen. Passage of more gas than usual.  Walking can help get rid of the air that was put into your GI tract during the procedure and reduce the bloating. If you had a lower endoscopy (such as a colonoscopy or flexible sigmoidoscopy) you may notice spotting of blood in your stool or on the toilet paper. If you underwent a bowel prep for your procedure, you may not have a normal bowel movement for a few days.  Please Note:  You might notice some irritation and congestion in your nose or some drainage.  This is from the oxygen used during your procedure.  There is no need for concern and it should clear up in a day or so.  SYMPTOMS TO REPORT IMMEDIATELY:  Following upper endoscopy (EGD)  Vomiting of blood or coffee ground material  New chest pain or pain under the shoulder blades  Painful or persistently difficult swallowing  New shortness of breath  Fever of 100F or higher  Black, tarry-looking stools  For urgent or emergent issues, a gastroenterologist can be reached at any hour by calling [phone]. Do not use MyChart messaging for urgent concerns.    DIET:  We do recommend a small  meal at first, but then you may proceed to your regular diet.  Drink plenty of fluids but you should avoid alcoholic beverages for 24 hours.  ACTIVITY:  You should plan to take it easy for the rest of today and you should NOT DRIVE or use heavy machinery until tomorrow (because of the sedation medicines used during the test).    FOLLOW UP: Our staff will call the number listed on your records the next business day following your procedure.  We will call around 7:15- 8:00 am to check on you and address any questions or concerns that you may have regarding the information given to you following your procedure. If we do not reach you, we will leave a message.     If any biopsies were taken you will be contacted by phone or by letter within the next 1-3 weeks.  Please call us at [phone] if you have not heard about the biopsies in 3 weeks.    SIGNATURES/CONFIDENTIALITY: You and/or your care partner have signed paperwork which will be entered into your electronic medical record.  These signatures attest to the fact that that the information above on your After Visit Summary has been reviewed and is understood.  Full responsibility of the confidentiality of this discharge information lies with you and/or your care-partner.

## 2021-12-06 NOTE — Op Note (Signed)
[place] Patient Name: Eric Richmond Procedure Date: 12/06/2021 10:08 AM MRN: [id number] Endoscopist: Jerene Bears , MD Age: 81 Referring MD:  Date of Birth: December 22, 1940 Gender: Male Account #: [id number] Procedure:                Upper GI endoscopy Indications:              Epigastric abdominal pain Medicines:                Monitored Anesthesia Care Procedure:                Pre-Anesthesia Assessment:                           - Prior to the procedure, a History and Physical                            was performed, and patient medications and                            allergies were reviewed. The patient's tolerance of                            previous anesthesia was also reviewed. The risks                            and benefits of the procedure and the sedation                            options and risks were discussed with the patient.                            All questions were answered, and informed consent                            was obtained. Prior Anticoagulants: The patient has                            taken no previous anticoagulant or antiplatelet                            agents. ASA Grade Assessment: III - A patient with                            severe systemic disease. After reviewing the risks                            and benefits, the patient was deemed in                            satisfactory condition to undergo the procedure.                           After obtaining informed consent, the endoscope was  passed under direct vision. Throughout the                            procedure, the patient's blood pressure, pulse, and                            oxygen saturations were monitored continuously. The                            GIF [id number] #[id number] was introduced through the                            mouth, and advanced to the second part of duodenum.                            The upper GI endoscopy was  accomplished without                            difficulty. The patient tolerated the procedure                            well. Scope In: Scope Out: Findings:                 The examined esophagus was normal. Z-line slightly                            irregular but not meeting criteria for Barrett's                            esophagus.                           The entire examined stomach was normal. Biopsies                            were taken with a cold forceps for histology and                            Helicobacter pylori testing.                           The examined duodenum was normal. Complications:            No immediate complications. Estimated Blood Loss:     Estimated blood loss was minimal. Impression:               - Normal esophagus.                           - Normal stomach. Biopsied to exclude H. Pylori                            given epigastric pain.                           - Normal examined duodenum. Recommendation:           -  Patient has a contact number available for                            emergencies. The signs and symptoms of potential                            delayed complications were discussed with the                            patient. Return to normal activities tomorrow.                            Written discharge instructions were provided to the                            patient.                           - Resume previous diet.                           - Continue present medications.                           - Await pathology results.                           - If epigastric pain continues and pathology                            results negative for H. Pylori infection then CT                            scan abd/pelvis with contrast is recommended to                            complete the evaluation of his upper abdominal pain. Jerene Bears, MD 12/06/2021 10:25:06 AM This report has been signed electronically.

## 2021-12-06 NOTE — Progress Notes (Signed)
VSS, transported to PACU °

## 2021-12-07 ENCOUNTER — Ambulatory Visit (INDEPENDENT_AMBULATORY_CARE_PROVIDER_SITE_OTHER): Payer: Medicare Other

## 2021-12-07 ENCOUNTER — Telehealth: Payer: Self-pay | Admitting: *Deleted

## 2021-12-07 VITALS — BP 120/62 | HR 67 | Temp 97.4°F | Ht 68.0 in | Wt 182.1 lb

## 2021-12-07 DIAGNOSIS — Z0000 Encounter for general adult medical examination without abnormal findings: Secondary | ICD-10-CM | POA: Diagnosis not present

## 2021-12-07 NOTE — Patient Instructions (Addendum)
[name] , Thank you for taking time to come for your Medicare Wellness Visit. I appreciate your ongoing commitment to your health goals. Please review the following plan we discussed and let me know if I can assist you in the future.   These are the goals we discussed:  Goals       No current goals (pt-stated)        This is a list of the screening recommended for you and due dates:  Health Maintenance  Topic Date Due   Complete foot exam   Never done   Yearly kidney health urinalysis for diabetes  Never done   Hemoglobin A1C  06/09/2021   COVID-19 Vaccine (2 - Pfizer risk series) 12/23/2021*   Zoster (Shingles) Vaccine (1 of 2) 03/09/2022*   Flu Shot  05/27/2022*   Eye exam for diabetics  02/26/2022   Colon Cancer Screening  04/09/2022   Yearly kidney function blood test for diabetes  10/10/2022   Tetanus Vaccine  12/17/2027   Pneumonia Vaccine  Completed   HPV Vaccine  Aged Out  *Topic was postponed. The date shown is not the original due date.    Advanced directives: Advance directive discussed with you today. Even though you declined this today, please call our office should you change your mind, and we can give you the proper paperwork for you to fill out.   Conditions/risks identified: None  Next appointment: Follow up in one year for your annual wellness visit.    Preventive Care 81 Years and Older, Male  Preventive care refers to lifestyle choices and visits with your health care provider that can promote health and wellness. What does preventive care include? A yearly physical exam. This is also called an annual well check. Dental exams once or twice a year. Routine eye exams. Ask your health care provider how often you should have your eyes checked. Personal lifestyle choices, including: Daily care of your teeth and gums. Regular physical activity. Eating a healthy diet. Avoiding tobacco and drug use. Limiting alcohol use. Practicing safe sex. Taking low  doses of aspirin every day. Taking vitamin and mineral supplements as recommended by your health care provider. What happens during an annual well check? The services and screenings done by your health care provider during your annual well check will depend on your age, overall health, lifestyle risk factors, and family history of disease. Counseling  Your health care provider may ask you questions about your: Alcohol use. Tobacco use. Drug use. Emotional well-being. Home and relationship well-being. Sexual activity. Eating habits. History of falls. Memory and ability to understand (cognition). Work and work Statistician. Screening  You may have the following tests or measurements: Height, weight, and BMI. Blood pressure. Lipid and cholesterol levels. These may be checked every 5 years, or more frequently if you are over 81 years old. Skin check. Lung cancer screening. You may have this screening every year starting at age 81 if you have a 30-pack-year history of smoking and currently smoke or have quit within the past 15 years. Fecal occult blood test (FOBT) of the stool. You may have this test every year starting at age 81. Flexible sigmoidoscopy or colonoscopy. You may have a sigmoidoscopy every 5 years or a colonoscopy every 10 years starting at age 81. Prostate cancer screening. Recommendations will vary depending on your family history and other risks. Hepatitis C blood test. Hepatitis B blood test. Sexually transmitted disease (STD) testing. Diabetes screening. This is done by checking  your blood sugar (glucose) after you have not eaten for a while (fasting). You may have this done every 1-3 years. Abdominal aortic aneurysm (AAA) screening. You may need this if you are a current or former smoker. Osteoporosis. You may be screened starting at age 81 if you are at high risk. Talk with your health care provider about your test results, treatment options, and if necessary, the need  for more tests. Vaccines  Your health care provider may recommend certain vaccines, such as: Influenza vaccine. This is recommended every year. Tetanus, diphtheria, and acellular pertussis (Tdap, Td) vaccine. You may need a Td booster every 10 years. Zoster vaccine. You may need this after age 81. Pneumococcal 13-valent conjugate (PCV13) vaccine. One dose is recommended after age 81. Pneumococcal polysaccharide (PPSV23) vaccine. One dose is recommended after age 81. Talk to your health care provider about which screenings and vaccines you need and how often you need them. This information is not intended to replace advice given to you by your health care provider. Make sure you discuss any questions you have with your health care provider. Document Released: 03/11/2015 Document Revised: 11/02/2015 Document Reviewed: 12/14/2014 Elsevier Interactive Patient Education  2017 [employer] Prevention in the Home Falls can cause injuries. They can happen to people of all ages. There are many things you can do to make your home safe and to help prevent falls. What can I do on the outside of my home? Regularly fix the edges of walkways and driveways and fix any cracks. Remove anything that might make you trip as you walk through a door, such as a raised step or threshold. Trim any bushes or trees on the path to your home. Use bright outdoor lighting. Clear any walking paths of anything that might make someone trip, such as rocks or tools. Regularly check to see if handrails are loose or broken. Make sure that both sides of any steps have handrails. Any raised decks and porches should have guardrails on the edges. Have any leaves, snow, or ice cleared regularly. Use sand or salt on walking paths during winter. Clean up any spills in your garage right away. This includes oil or grease spills. What can I do in the bathroom? Use night lights. Install grab bars by the toilet and in the tub and  shower. Do not use towel bars as grab bars. Use non-skid mats or decals in the tub or shower. If you need to sit down in the shower, use a plastic, non-slip stool. Keep the floor dry. Clean up any water that spills on the floor as soon as it happens. Remove soap buildup in the tub or shower regularly. Attach bath mats securely with double-sided non-slip rug tape. Do not have throw rugs and other things on the floor that can make you trip. What can I do in the bedroom? Use night lights. Make sure that you have a light by your bed that is easy to reach. Do not use any sheets or blankets that are too big for your bed. They should not hang down onto the floor. Have a firm chair that has side arms. You can use this for support while you get dressed. Do not have throw rugs and other things on the floor that can make you trip. What can I do in the kitchen? Clean up any spills right away. Avoid walking on wet floors. Keep items that you use a lot in easy-to-reach places. If you need to reach something  above you, use a strong step stool that has a grab bar. Keep electrical cords out of the way. Do not use floor polish or wax that makes floors slippery. If you must use wax, use non-skid floor wax. Do not have throw rugs and other things on the floor that can make you trip. What can I do with my stairs? Do not leave any items on the stairs. Make sure that there are handrails on both sides of the stairs and use them. Fix handrails that are broken or loose. Make sure that handrails are as long as the stairways. Check any carpeting to make sure that it is firmly attached to the stairs. Fix any carpet that is loose or worn. Avoid having throw rugs at the top or bottom of the stairs. If you do have throw rugs, attach them to the floor with carpet tape. Make sure that you have a light switch at the top of the stairs and the bottom of the stairs. If you do not have them, ask someone to add them for  you. What else can I do to help prevent falls? Wear shoes that: Do not have high heels. Have rubber bottoms. Are comfortable and fit you well. Are closed at the toe. Do not wear sandals. If you use a stepladder: Make sure that it is fully opened. Do not climb a closed stepladder. Make sure that both sides of the stepladder are locked into place. Ask someone to hold it for you, if possible. Clearly mark and make sure that you can see: Any grab bars or handrails. First and last steps. Where the edge of each step is. Use tools that help you move around (mobility aids) if they are needed. These include: Canes. Walkers. Scooters. Crutches. Turn on the lights when you go into a dark area. Replace any light bulbs as soon as they burn out. Set up your furniture so you have a clear path. Avoid moving your furniture around. If any of your floors are uneven, fix them. If there are any pets around you, be aware of where they are. Review your medicines with your doctor. Some medicines can make you feel dizzy. This can increase your chance of falling. Ask your doctor what other things that you can do to help prevent falls. This information is not intended to replace advice given to you by your health care provider. Make sure you discuss any questions you have with your health care provider. Document Released: 12/09/2008 Document Revised: 07/21/2015 Document Reviewed: 03/19/2014 Elsevier Interactive Patient Education  2017 [employer].

## 2021-12-07 NOTE — Telephone Encounter (Signed)
  Follow up Call-     12/06/2021    9:13 AM 04/10/2019   10:32 AM  Call back number  Post procedure Call Back phone  # [phone]-wife,[name]'s #  Permission to leave phone message Yes Yes     Patient questions:  Do you have a fever, pain , or abdominal swelling? No. Pain Score  0 *  Have you tolerated food without any problems? Yes.    Have you been able to return to your normal activities? Yes.    Do you have any questions about your discharge instructions: Diet   No. Medications  No. Follow up visit  No.  Do you have questions or concerns about your Care? No.  Actions: * If pain score is 4 or above: No action needed, pain <4.

## 2021-12-07 NOTE — Progress Notes (Signed)
EEG is normal, no seizure noted, thanks

## 2021-12-07 NOTE — Progress Notes (Signed)
Subjective:   Eric Richmond is a 81 y.o. male who presents for Medicare Annual/Subsequent preventive examination.  Review of Systems      Cardiac Risk Factors include: advanced age (>60mn, >>65women);hypertension;male gender     Objective:    Today's Vitals   12/07/21 1124  BP: 120/62  Pulse: 67  Temp: (!) 97.4 F (36.3 C)  TempSrc: Oral  SpO2: 97%  Weight: 182 lb 1.6 oz (82.6 kg)  Height: '5\' 8"'$  (1.727 m)   Body mass index is 27.69 kg/m.     12/07/2021   11:42 AM 10/18/2021    9:31 AM 10/09/2021   12:37 PM 09/12/2021    8:17 PM 06/24/2021    6:45 PM 04/07/2021   11:00 AM 03/15/2021    1:28 PM  Advanced Directives  Does Patient Have a Medical Advance Directive? No No No No No Yes No  Would patient like information on creating a medical advance directive? No - Patient declined   Yes (ED - Information included in AVS)   No - Patient declined    Current Medications (verified) Outpatient Encounter Medications as of 12/07/2021  Medication Sig   albuterol (PROAIR HFA) 108 (90 Base) MCG/ACT inhaler Inhale 2 puffs into the lungs every 4 (four) hours as needed for wheezing or shortness of breath.   amLODipine (NORVASC) 10 MG tablet Take 0.5 tablets (5 mg total) by mouth daily.   ascorbic acid (VITAMIN C) 500 MG tablet Take 1 tablet (500 mg total) by mouth daily.   aspirin EC 81 MG tablet Take 81 mg by mouth daily.   diazepam (VALIUM) 2 MG tablet TAKE 1 TABLET BY MOUTH EVERY 8 HOURS AS NEEDED FOR  DIZZINESS   docusate sodium (COLACE) 100 MG capsule Take 1 capsule (100 mg total) by mouth 2 (two) times daily.   escitalopram (LEXAPRO) 20 MG tablet TAKE 1 TABLET BY MOUTH  DAILY   fluticasone (FLONASE) 50 MCG/ACT nasal spray Place 2 sprays into both nostrils daily.   furosemide (LASIX) 20 MG tablet Take 1 tablet (20 mg total) by mouth daily. (Patient not taking: Reported on 12/06/2021)   losartan-hydrochlorothiazide (HYZAAR) 50-12.5 MG tablet Take 1 tablet by mouth every evening.    memantine (NAMENDA) 10 MG tablet Take 1 tablet (10 mg total) by mouth 2 (two) times daily.   Multiple Vitamin (MULTIVITAMIN WITH MINERALS) TABS tablet Take 1 tablet by mouth daily.   omeprazole (PRILOSEC) 20 MG capsule Take 1 capsule (20 mg total) by mouth daily.   pantoprazole (PROTONIX) 40 MG tablet Take 1 tablet (40 mg total) by mouth daily.   risperiDONE (RISPERDAL) 0.5 MG tablet Take 1 tablet (0.5 mg total) by mouth at bedtime.   [DISCONTINUED] atenolol (TENORMIN) 50 MG tablet TAKE 1 TABLET EVERY DAY (NEED MD APPOINTMENT FOR REFILLS) (Patient taking differently: Take 50 mg by mouth daily. )   No facility-administered encounter medications on file as of 12/07/2021.    Allergies (verified) Oxycodone and Tramadol   History: Past Medical History:  Diagnosis Date   Allergy    Anal fissure 20 years ago   COVID-19 01/2019   Diabetes mellitus without complication ([place] 13/23/5573  ED (erectile dysfunction)    Essential hypertension 10/22/2006   Qualifier: Diagnosis of  By: FSarajane JewsMD, SIshmael Holter   GERD (gastroesophageal reflux disease)    Hard of hearing    Mild neurocognitive disorder 01/06/2019   OSA (obstructive sleep apnea)    per Dr. CGwenette Greet, cpap -2.0  Osteoarthritis    Right knee and right thumb; s/p total knee replacement   Pneumonia 2015   Past Surgical History:  Procedure Laterality Date   ANAL FISSURE REPAIR     colonoscopy  04/10/2019   per Dr. Hilarie Fredrickson, adenomatous polyps removed, no repeats due to age   40 ARTHROSCOPY     left knee   KNEE CLOSED REDUCTION Left 01/13/2014   Procedure: CLOSED MANIPULATION LEFT KNEE;  Surgeon: Gearlean Alf, MD;  Location: [facility];  Service: Orthopedics;  Laterality: Left;   SURGERY AGE 65 OR 7 ON LEFT KNEE FOR A "GLAND BEHIND THE KNEE"     TOTAL KNEE ARTHROPLASTY Left 10/26/2013   Procedure: LEFT TOTAL KNEE ARTHROPLASTY;  Surgeon: Gearlean Alf, MD;  Location: [facility];  Service: Orthopedics;  Laterality: Left;   Family History   Problem Relation Age of Onset   Breast cancer Sister    Diabetes Sister    Hypertension Sister    Thyroid cancer Sister    Diabetes Mother    Hypertension Mother    Diabetes Father    Hypertension Father    Diabetes Sister    Hypertension Other    Diabetes Brother    Hypertension Brother    Heart failure Brother    Heart failure Brother    Diabetes Sister    Colon cancer Neg Hx    Esophageal cancer Neg Hx    Rectal cancer Neg Hx    Stomach cancer Neg Hx    Social History   Socioeconomic History   Marital status: Married    Spouse name: [name]   Number of children: 2   Years of education: 12   Highest education level: High school graduate  Occupational History   Occupation: retired  Tobacco Use   Smoking status: Former    Packs/day: 0.30    Years: 20.00    Total pack years: 6.00    Types: Cigarettes    Quit date: 02/26/1977    Years since quitting: 44.8   Smokeless tobacco: Former    Types: Nurse, children's Use: Never used  Substance and Sexual Activity   Alcohol use: No    Alcohol/week: 0.0 standard drinks of alcohol   Drug use: No   Sexual activity: Not Currently    Partners: Female  Other Topics Concern   Not on file  Social History Narrative   Epworth Sleepiness Scale = 5 (12/14/2014)      Right handed      Graduated HS      Lives with wife      Drinks caffeine   One story home   Social Determinants of Health   Financial Resource Strain: Low Risk  (12/07/2021)   Overall Financial Resource Strain (CARDIA)    Difficulty of Paying Living Expenses: Not hard at all  Food Insecurity: No Food Insecurity (12/07/2021)   Hunger Vital Sign    Worried About Running Out of Food in the Last Year: Never true    Ran Out of Food in the Last Year: Never true  Transportation Needs: No Transportation Needs (12/07/2021)   PRAPARE - Hydrologist (Medical): No    Lack of Transportation (Non-Medical): No  Physical Activity:  Inactive (12/07/2021)   Exercise Vital Sign    Days of Exercise per Week: 0 days    Minutes of Exercise per Session: 0 min  Stress: No Stress Concern Present (12/07/2021)   [employer] of  Occupational Health - Occupational Stress Questionnaire    Feeling of Stress : Not at all  Social Connections: Socially Integrated (12/07/2021)   Social Connection and Isolation Panel [NHANES]    Frequency of Communication with Friends and Family: More than three times a week    Frequency of Social Gatherings with Friends and Family: More than three times a week    Attends Religious Services: More than 4 times per year    Active Member of [employer] or Organizations: Yes    Attends Music therapist: More than 4 times per year    Marital Status: Married    Tobacco Counseling Counseling given: Not Answered   Clinical Intake:  Pre-visit preparation completed: No  Pain : No/denies pain     BMI - recorded: 27.69 Nutritional Status: BMI 25 -29 Overweight Nutritional Risks: None Diabetes: No  How often do you need to have someone help you when you read instructions, pamphlets, or other written materials from your doctor or pharmacy?: 3 - Sometimes (Wife Assist)  Diabetic?  No  Interpreter Needed?: No  Information entered by :: Rolene Arbour LPN   Activities of Daily Living    12/07/2021   11:40 AM  In your present state of health, do you have any difficulty performing the following activities:  Hearing? 1  Comment Wears hearing aids  Vision? 0  Difficulty concentrating or making decisions? 0  Walking or climbing stairs? 0  Dressing or bathing? 0  Doing errands, shopping? 0  Preparing Food and eating ? N  Using the Toilet? N  In the past six months, have you accidently leaked urine? N  Do you have problems with loss of bowel control? N  Managing your Medications? N  Managing your Finances? N  Housekeeping or managing your Housekeeping? N    Patient Care  Team: [name], MD as PCP - General Delice Lesch Lezlie Octave, MD as Consulting Physician (Neurology) Earnie Larsson, [facility] as Pharmacist (Pharmacist) Tobi Bastos, RN as [place] any recent Medical Services you may have received from other than [facility] providers in the past year (date may be approximate).     Assessment:   This is a routine wellness examination for Antoinette.  Hearing/Vision screen Hearing Screening - Comments:: Wears hearing aids Vision Screening - Comments:: Wears rx glasses - up to date with routine eye exams with  Dr Nicki Reaper  Dietary issues and exercise activities discussed: Current Exercise Habits: The patient does not participate in regular exercise at present, Exercise limited by: None identified   Goals Addressed               This Visit's Progress     No current goals (pt-stated)         Depression Screen    12/07/2021   11:38 AM 10/12/2021   10:50 AM 10/04/2021   12:00 PM 09/21/2021    9:29 AM 08/11/2021   11:49 AM 07/27/2021    4:47 PM 06/28/2021   11:10 AM  PHQ 2/9 Scores  PHQ - 2 Score 0 0 0 0 0 0 0  PHQ- 9 Score 0 '6  10 5 8 7    '$ Fall Risk    12/07/2021   11:41 AM 10/18/2021    9:30 AM 10/12/2021   10:49 AM 09/21/2021    9:30 AM 07/27/2021    4:46 PM  Fall Risk   Falls in the past year? 1 0 1 1 0  Number falls  in past yr: 1 0 1 1 0  Injury with Fall? 0 0 0 0 0  Comment No injury or medical attention needed      Risk for fall due to : No Fall Risks  History of fall(s);Mental status change No Fall Risks No Fall Risks  Follow up Falls prevention discussed   Falls evaluation completed Falls evaluation completed    [place]:  Any stairs in or around the home? Yes  If so, are there any without handrails? No  Home free of loose throw rugs in walkways, pet beds, electrical cords, etc? Yes  Adequate lighting in your home to reduce risk of falls? Yes   ASSISTIVE DEVICES  UTILIZED TO PREVENT FALLS:  Life alert? No  Use of a cane, walker or w/c? Yes  Grab bars in the bathroom? Yes  Shower chair or bench in shower? Yes  Elevated toilet seat or a handicapped toilet? No   TIMED UP AND GO:  Was the test performed? Yes .  Length of time to ambulate 10 feet: 10 sec.   Gait slow and steady with assistive device  Cognitive Function:    04/07/2021   11:00 AM 10/05/2020    8:00 AM 08/13/2017    5:04 PM 08/09/2016    4:29 PM  MMSE - Mini Mental State Exam  Orientation to time 0 '3 4 4  '$ Orientation to time comments    Disoriented to date.  Orientation to Place '2 5 5 5  '$ Registration '3 3 3 3  '$ Attention/ Calculation 0 '2 4 4  '$ Recall 3 0 2 1  Language- name 2 objects '2 2 2 2  '$ Language- repeat 0 '1 1 1  '$ Language- follow 3 step command '3 1 3 3  '$ Language- read & follow direction '1 1 1 1  '$ Write a sentence 0 0 1 1  Copy design 0 0 0 0  Total score '14 18 26 25      '$ 12/02/2018   11:00 AM 04/25/2018    9:00 AM  Montreal Cognitive Assessment   Visuospatial/ Executive (0/5) 1 1  Naming (0/3) 2 3  Attention: Read list of digits (0/2) 1 1  Attention: Read list of letters (0/1) 1 1  Attention: Serial 7 subtraction starting at 100 (0/3) 2 1  Language: Repeat phrase (0/2) 1 1  Language : Fluency (0/1) 0 0  Abstraction (0/2) 0 0  Delayed Recall (0/5) 0 0  Orientation (0/6) 4 2  Total 12 10  Adjusted Score (based on education) 13 11      12/07/2021   11:42 AM  6CIT Screen  What Year? 4 points  What month? 3 points  What time? 0 points  Count back from 20 4 points  Months in reverse 4 points  Repeat phrase 4 points  Total Score 19 points    Immunizations Immunization History  Administered Date(s) Administered   Fluad Quad(high Dose 65+) 12/15/2018   Influenza Split 01/10/2013, 11/18/2019   Influenza Whole 11/21/2009, 01/17/2010   Influenza, High Dose Seasonal PF 11/18/2014, 12/30/2015, 12/12/2016   Influenza,inj,Quad PF,6+ Mos 12/15/2013    Influenza-Unspecified 12/05/2020   PFIZER(Purple Top)SARS-COV-2 Vaccination 12/05/2020   Pneumococcal Conjugate-13 12/15/2013   Pneumococcal Polysaccharide-23 06/02/2015   Tdap 12/12/2017, 12/16/2017    TDAP status: Up to date  Flu Vaccine status: Up to date  Pneumococcal vaccine status: Completed during today's visit.  Covid-19 vaccine status: Completed vaccines  Qualifies for Shingles Vaccine? Yes  Zostavax completed No   Shingrix Completed?: No.    Education has been provided regarding the importance of this vaccine. Patient has been advised to call insurance company to determine out of pocket expense if they have not yet received this vaccine. Advised may also receive vaccine at local pharmacy or Health Dept. Verbalized acceptance and understanding.  Screening Tests Health Maintenance  Topic Date Due   FOOT EXAM  Never done   Diabetic kidney evaluation - Urine ACR  Never done   HEMOGLOBIN A1C  06/09/2021   COVID-19 Vaccine (2 - Pfizer risk series) 12/23/2021 (Originally 12/26/2020)   Zoster Vaccines- Shingrix (1 of 2) 03/09/2022 (Originally 07/09/1959)   INFLUENZA VACCINE  05/27/2022 (Originally 09/26/2021)   OPHTHALMOLOGY EXAM  02/26/2022   COLONOSCOPY (Pts 45-43yr Insurance coverage will need to be confirmed)  04/09/2022   Diabetic kidney evaluation - GFR measurement  10/10/2022   TETANUS/TDAP  12/17/2027   Pneumonia Vaccine 81 Years old  Completed   HPV VACCINES  Aged Out    Health Maintenance  Health Maintenance Due  Topic Date Due   FOOT EXAM  Never done   Diabetic kidney evaluation - Urine ACR  Never done   HEMOGLOBIN A1C  06/09/2021    Colorectal cancer screening: No longer required.   Lung Cancer Screening: (Low Dose CT Chest recommended if Age 81-80years, 30 pack-year currently smoking OR have quit w/in 15years.)  qualify.     Additional Screening:  Hepatitis C Screening: does not qualify; Completed   Vision Screening: Recommended annual  ophthalmology exams for early detection of glaucoma and other disorders of the eye. Is the patient up to date with their annual eye exam?  Yes  Who is the provider or what is the name of the office in which the patient attends annual eye exams? Dr SNicki ReaperIf pt is not established with a provider, would they like to be referred to a provider to establish care? No .   Dental Screening: Recommended annual dental exams for proper oral hygiene  Community Resource Referral / Chronic Care Management:   CRR required this visit?  No   CCM required this visit?  No      Plan:     I have personally reviewed and noted the following in the patient's chart:   Medical and social history Use of alcohol, tobacco or illicit drugs  Current medications and supplements including opioid prescriptions. Patient is not currently taking opioid prescriptions. Functional ability and status Nutritional status Physical activity Advanced directives List of other physicians Hospitalizations, surgeries, and ER visits in previous 12 months Vitals Screenings to include cognitive, depression, and falls Referrals and appointments  In addition, I have reviewed and discussed with patient certain preventive protocols, quality metrics, and best practice recommendations. A written personalized care plan for preventive services as well as general preventive health recommendations were provided to patient.     BCriselda Peaches LPN   [id number]  Nurse Notes: Patient due labs Diabetic Kidney Evaluation-Urine ACR and Hemoglobin A1C

## 2021-12-11 ENCOUNTER — Encounter: Payer: Self-pay | Admitting: Internal Medicine

## 2021-12-15 ENCOUNTER — Encounter: Payer: Self-pay | Admitting: Family Medicine

## 2021-12-15 ENCOUNTER — Ambulatory Visit (INDEPENDENT_AMBULATORY_CARE_PROVIDER_SITE_OTHER): Payer: Medicare Other | Admitting: Family Medicine

## 2021-12-15 VITALS — BP 124/70 | HR 70 | Temp 98.1°F | Ht 68.0 in | Wt 181.0 lb

## 2021-12-15 DIAGNOSIS — K219 Gastro-esophageal reflux disease without esophagitis: Secondary | ICD-10-CM

## 2021-12-15 DIAGNOSIS — F02818 Dementia in other diseases classified elsewhere, unspecified severity, with other behavioral disturbance: Secondary | ICD-10-CM

## 2021-12-15 DIAGNOSIS — N138 Other obstructive and reflux uropathy: Secondary | ICD-10-CM

## 2021-12-15 DIAGNOSIS — I10 Essential (primary) hypertension: Secondary | ICD-10-CM

## 2021-12-15 DIAGNOSIS — G301 Alzheimer's disease with late onset: Secondary | ICD-10-CM

## 2021-12-15 DIAGNOSIS — E119 Type 2 diabetes mellitus without complications: Secondary | ICD-10-CM

## 2021-12-15 DIAGNOSIS — J3089 Other allergic rhinitis: Secondary | ICD-10-CM

## 2021-12-15 DIAGNOSIS — R42 Dizziness and giddiness: Secondary | ICD-10-CM | POA: Diagnosis not present

## 2021-12-15 DIAGNOSIS — N401 Enlarged prostate with lower urinary tract symptoms: Secondary | ICD-10-CM | POA: Diagnosis not present

## 2021-12-15 DIAGNOSIS — F411 Generalized anxiety disorder: Secondary | ICD-10-CM

## 2021-12-15 LAB — BASIC METABOLIC PANEL
BUN: 19 mg/dL (ref 6–23)
CO2: 27 mEq/L (ref 19–32)
Calcium: 9.3 mg/dL (ref 8.4–10.5)
Chloride: 102 mEq/L (ref 96–112)
Creatinine, Ser: 1.21 mg/dL (ref 0.40–1.50)
GFR: 56.18 mL/min — ABNORMAL LOW (ref >60.00)
Glucose, Bld: 96 mg/dL (ref 70–99)
Potassium: 3.8 mEq/L (ref 3.5–5.1)
Sodium: 139 mEq/L (ref 135–145)

## 2021-12-15 LAB — PSA: PSA: 1.76 ng/mL (ref 0.10–4.00)

## 2021-12-15 LAB — CBC WITH DIFFERENTIAL/PLATELET
Basophils Absolute: 0 10*3/uL (ref 0.0–0.1)
Basophils Relative: 0.4 % (ref 0.0–3.0)
Eosinophils Absolute: 0.2 10*3/uL (ref 0.0–0.7)
Eosinophils Relative: 2.4 % (ref 0.0–5.0)
HCT: 36 % — ABNORMAL LOW (ref 39.0–52.0)
Hemoglobin: 12.1 g/dL — ABNORMAL LOW (ref 13.0–17.0)
Lymphocytes Relative: 16 % (ref 12.0–46.0)
Lymphs Abs: 1 10*3/uL (ref 0.7–4.0)
MCHC: 33.5 g/dL (ref 30.0–36.0)
MCV: 88 fl (ref 78.0–100.0)
Monocytes Absolute: 0.4 10*3/uL (ref 0.1–1.0)
Monocytes Relative: 6.6 % (ref 3.0–12.0)
Neutro Abs: 4.9 10*3/uL (ref 1.4–7.7)
Neutrophils Relative %: 74.6 % (ref 43.0–77.0)
Platelets: 194 10*3/uL (ref 150.0–400.0)
RBC: 4.09 Mil/uL — ABNORMAL LOW (ref 4.22–5.81)
RDW: 14.3 % (ref 11.5–15.5)
WBC: 6.5 10*3/uL (ref 4.0–10.5)

## 2021-12-15 LAB — LIPID PANEL
Cholesterol: 128 mg/dL (ref 0–200)
HDL: 46.2 mg/dL (ref >39.00)
LDL Cholesterol: 67 mg/dL (ref 0–99)
NonHDL: 81.46
Total CHOL/HDL Ratio: 3
Triglycerides: 71 mg/dL (ref 0.0–149.0)
VLDL: 14.2 mg/dL (ref 0.0–40.0)

## 2021-12-15 LAB — HEPATIC FUNCTION PANEL
ALT: 11 U/L (ref 0–53)
AST: 18 U/L (ref 0–37)
Albumin: 4 g/dL (ref 3.5–5.2)
Alkaline Phosphatase: 76 U/L (ref 39–117)
Bilirubin, Direct: 0.2 mg/dL (ref 0.0–0.3)
Total Bilirubin: 0.9 mg/dL (ref 0.2–1.2)
Total Protein: 6.7 g/dL (ref 6.0–8.3)

## 2021-12-15 LAB — TSH: TSH: 2.54 u[IU]/mL (ref 0.35–5.50)

## 2021-12-15 LAB — HEMOGLOBIN A1C: Hgb A1c MFr Bld: 6 % (ref 4.6–6.5)

## 2021-12-15 MED ORDER — RISPERIDONE 0.5 MG PO TABS: 0.5000 mg | ORAL_TABLET | Freq: Every day | ORAL | 3 refills | Status: DC

## 2021-12-15 MED ORDER — PANTOPRAZOLE SODIUM 40 MG PO TBEC: 40.0000 mg | DELAYED_RELEASE_TABLET | Freq: Every day | ORAL | 3 refills | Status: DC

## 2021-12-15 MED ORDER — LOSARTAN POTASSIUM-HCTZ 50-12.5 MG PO TABS: 1.0000 | ORAL_TABLET | Freq: Every evening | ORAL | 3 refills | Status: DC

## 2021-12-15 MED ORDER — MEMANTINE HCL 10 MG PO TABS: 10.0000 mg | ORAL_TABLET | Freq: Two times a day (BID) | ORAL | 3 refills | Status: DC

## 2021-12-15 MED ORDER — FUROSEMIDE 20 MG PO TABS: 20.0000 mg | ORAL_TABLET | Freq: Every day | ORAL | 3 refills | Status: DC

## 2021-12-15 NOTE — Progress Notes (Signed)
Subjective:    Patient ID: Eric Richmond, male    DOB: 12/17/1940, 81 y.o.   MRN: [id number]  HPI Here with his wife to follow up on issues. Overall he is doing well. His BP is stable. His dementia seems to be stable, and the Risperidone he takes at bedtime has been very effective at helping him relax. His GERD is stable and his appetite is intact. He and his wife stay active socially and they attend church regularly.    Review of Systems  Constitutional: Negative.   HENT: Negative.    Eyes: Negative.   Respiratory: Negative.    Cardiovascular: Negative.   Gastrointestinal: Negative.   Genitourinary: Negative.   Musculoskeletal: Negative.   Skin: Negative.   Neurological: Negative.   Psychiatric/Behavioral:  Positive for confusion. Negative for agitation and behavioral problems.        Objective:   Physical Exam Constitutional:      General: He is not in acute distress.    Appearance: Normal appearance. He is well-developed. He is not diaphoretic.  HENT:     Head: Normocephalic and atraumatic.     Right Ear: External ear normal.     Left Ear: External ear normal.     Nose: Nose normal.     Mouth/Throat:     Pharynx: No oropharyngeal exudate.  Eyes:     General: No scleral icterus.       Right eye: No discharge.        Left eye: No discharge.     Conjunctiva/sclera: Conjunctivae normal.     Pupils: Pupils are equal, round, and reactive to light.  Neck:     Thyroid: No thyromegaly.     Vascular: No JVD.     Trachea: No tracheal deviation.  Cardiovascular:     Rate and Rhythm: Normal rate and regular rhythm.     Heart sounds: Normal heart sounds. No murmur heard.    No friction rub. No gallop.  Pulmonary:     Effort: Pulmonary effort is normal. No respiratory distress.     Breath sounds: Normal breath sounds. No wheezing or rales.  Chest:     Chest wall: No tenderness.  Abdominal:     General: Bowel sounds are normal. There is no distension.     Palpations:  Abdomen is soft. There is no mass.     Tenderness: There is no abdominal tenderness. There is no guarding or rebound.  Genitourinary:    Penis: Normal. No tenderness.      Testes: Normal.  Musculoskeletal:        General: No tenderness. Normal range of motion.     Cervical back: Neck supple.  Lymphadenopathy:     Cervical: No cervical adenopathy.  Skin:    General: Skin is warm and dry.     Coloration: Skin is not pale.     Findings: No erythema or rash.  Neurological:     Mental Status: He is alert and oriented to person, place, and time.     Cranial Nerves: No cranial nerve deficit.     Motor: No abnormal muscle tone.     Coordination: Coordination normal.     Deep Tendon Reflexes: Reflexes are normal and symmetric. Reflexes normal.  Psychiatric:        Behavior: Behavior normal.        Thought Content: Thought content normal.        Judgment: Judgment normal.  Assessment & Plan:  His dementia seems to have stabilized so we will continue the Memantine and Risperidine. His anxiety is well controlled with Lexapro and Valium. His GERD and HTN are stable. We will get fasting labs to check an A1c, lipids, etc. We spent a total of ( 33  ) minutes reviewing records and discussing these issues.  Alysia Penna, MD

## 2021-12-18 ENCOUNTER — Other Ambulatory Visit: Payer: Self-pay | Admitting: Family Medicine

## 2021-12-20 MED ORDER — IRON (FERROUS SULFATE) 325 (65 FE) MG PO TABS: 325.0000 mg | ORAL_TABLET | Freq: Every day | ORAL | 3 refills | Status: DC

## 2021-12-20 NOTE — Addendum Note (Signed)
Addended by: Agnes Lawrence on: 12/20/2021 04:44 PM   Modules accepted: Orders

## 2022-01-14 ENCOUNTER — Other Ambulatory Visit: Payer: Self-pay | Admitting: Family Medicine

## 2022-02-28 DIAGNOSIS — G4733 Obstructive sleep apnea (adult) (pediatric): Secondary | ICD-10-CM | POA: Diagnosis not present

## 2022-04-01 ENCOUNTER — Emergency Department (HOSPITAL_BASED_OUTPATIENT_CLINIC_OR_DEPARTMENT_OTHER)
Admission: EM | Admit: 2022-04-01 | Discharge: 2022-04-01 | Disposition: A | Discharge status: Home / Self Care | Payer: Medicare Other | Attending: Emergency Medicine | Admitting: Emergency Medicine

## 2022-04-01 ENCOUNTER — Encounter (HOSPITAL_BASED_OUTPATIENT_CLINIC_OR_DEPARTMENT_OTHER): Payer: Self-pay | Admitting: *Deleted

## 2022-04-01 ENCOUNTER — Emergency Department (HOSPITAL_BASED_OUTPATIENT_CLINIC_OR_DEPARTMENT_OTHER): Payer: Medicare Other

## 2022-04-01 ENCOUNTER — Other Ambulatory Visit: Payer: Self-pay

## 2022-04-01 DIAGNOSIS — K5900 Constipation, unspecified: Secondary | ICD-10-CM | POA: Diagnosis not present

## 2022-04-01 DIAGNOSIS — I700 Atherosclerosis of aorta: Secondary | ICD-10-CM | POA: Diagnosis not present

## 2022-04-01 DIAGNOSIS — R109 Unspecified abdominal pain: Secondary | ICD-10-CM | POA: Diagnosis not present

## 2022-04-01 LAB — CBC WITH DIFFERENTIAL/PLATELET
Abs Immature Granulocytes: 0.01 10*3/uL (ref 0.00–0.07)
Basophils Absolute: 0 10*3/uL (ref 0.0–0.1)
Basophils Relative: 0 %
Eosinophils Absolute: 0.1 10*3/uL (ref 0.0–0.5)
Eosinophils Relative: 1 %
HCT: 38 % — ABNORMAL LOW (ref 39.0–52.0)
Hemoglobin: 13 g/dL (ref 13.0–17.0)
Immature Granulocytes: 0 %
Lymphocytes Relative: 25 %
Lymphs Abs: 1.7 10*3/uL (ref 0.7–4.0)
MCH: 28.8 pg (ref 26.0–34.0)
MCHC: 34.2 g/dL (ref 30.0–36.0)
MCV: 84.3 fL (ref 80.0–100.0)
Monocytes Absolute: 0.4 10*3/uL (ref 0.1–1.0)
Monocytes Relative: 6 %
Neutro Abs: 4.6 10*3/uL (ref 1.7–7.7)
Neutrophils Relative %: 68 %
Platelets: 156 10*3/uL (ref 150–400)
RBC: 4.51 MIL/uL (ref 4.22–5.81)
RDW: 13.8 % (ref 11.5–15.5)
WBC: 6.8 10*3/uL (ref 4.0–10.5)
nRBC: 0 % (ref 0.0–0.2)

## 2022-04-01 LAB — COMPREHENSIVE METABOLIC PANEL
ALT: 9 U/L (ref 0–44)
AST: 19 U/L (ref 15–41)
Albumin: 4.1 g/dL (ref 3.5–5.0)
Alkaline Phosphatase: 58 U/L (ref 38–126)
Anion gap: 9 (ref 5–15)
BUN: 19 mg/dL (ref 8–23)
CO2: 28 mmol/L (ref 22–32)
Calcium: 9.4 mg/dL (ref 8.9–10.3)
Chloride: 101 mmol/L (ref 98–111)
Creatinine, Ser: 1.15 mg/dL (ref 0.61–1.24)
GFR, Estimated: >60 mL/min (ref >60)
Glucose, Bld: 95 mg/dL (ref 70–99)
Potassium: 3.9 mmol/L (ref 3.5–5.1)
Sodium: 138 mmol/L (ref 135–145)
Total Bilirubin: 1 mg/dL (ref 0.3–1.2)
Total Protein: 6.7 g/dL (ref 6.5–8.1)

## 2022-04-01 MED ORDER — PEG 3350-KCL-NABCB-NACL-NASULF 236 G PO SOLR: 4000.0000 mL | Freq: Once | ORAL | 0 refills | Status: AC

## 2022-04-01 MED ORDER — IOHEXOL 300 MG/ML  SOLN
100.0000 mL | Freq: Once | INTRAMUSCULAR | Status: AC | PRN
  Administered 2022-04-01: 80 mL via INTRAVENOUS

## 2022-04-01 MED ORDER — POLYETHYLENE GLYCOL 3350 17 G PO PACK: 17.0000 g | PACK | Freq: Every day | ORAL | 0 refills | Status: AC

## 2022-04-01 NOTE — ED Provider Notes (Signed)
[facility] Provider Note   CSN: [id number] Arrival date & time: 04/01/22  4098     History {Add pertinent medical, surgical, social history, OB history to HPI:1} Chief Complaint  Patient presents with   Constipation    EOIN WILLDEN is a 82 y.o. male.  Patient is an is alert oriented x 3, no patient midst to generalized abdominal pain with constipation x 1 week.  No improvement with home laxatives, enema, and self disimpaction.  Denies any nausea or vomiting.  Able to tolerate food without difficulty.  The history is provided by the patient. No language interpreter was used.  Constipation Associated symptoms: abdominal pain   Associated symptoms: no back pain, no dysuria, no fever and no vomiting        Home Medications Prior to Admission medications   Medication Sig Start Date End Date Taking? Authorizing Provider  albuterol (PROAIR HFA) 108 (90 Base) MCG/ACT inhaler Inhale 2 puffs into the lungs every 4 (four) hours as needed for wheezing or shortness of breath. 10/16/21   [name], MD  amLODipine (NORVASC) 10 MG tablet Take 0.5 tablets (5 mg total) by mouth daily. 04/10/21   [name], MD  ascorbic acid (VITAMIN C) 500 MG tablet Take 1 tablet (500 mg total) by mouth daily. 02/17/19   Caren Griffins, MD  aspirin EC 81 MG tablet Take 81 mg by mouth daily.    [provider]  diazepam (VALIUM) 2 MG tablet TAKE 1 TABLET BY MOUTH EVERY 8 HOURS AS NEEDED FOR  DIZZINESS 11/06/21   [name], MD  docusate sodium (COLACE) 100 MG capsule Take 1 capsule (100 mg total) by mouth 2 (two) times daily. 08/18/21   [name], MD  escitalopram (LEXAPRO) 20 MG tablet TAKE 1 TABLET BY MOUTH  DAILY 11/28/21   [name], MD  fluticasone [facility]) 50 MCG/ACT nasal spray Place 2 sprays into both nostrils daily. 10/21/17   [name], MD  furosemide (LASIX) 20 MG tablet Take 1 tablet (20 mg total) by mouth daily.  12/15/21   [name], MD  Iron, Ferrous Sulfate, 325 (65 Fe) MG TABS Take 325 mg by mouth daily. 12/20/21   [name], MD  losartan-hydrochlorothiazide (HYZAAR) 50-12.5 MG tablet Take 1 tablet by mouth every evening. 12/15/21   [name], MD  memantine (NAMENDA) 10 MG tablet Take 1 tablet (10 mg total) by mouth 2 (two) times daily. 12/15/21   [name], MD  Multiple Vitamin (MULTIVITAMIN WITH MINERALS) TABS tablet Take 1 tablet by mouth daily.    [provider]  pantoprazole (PROTONIX) 40 MG tablet Take 1 tablet (40 mg total) by mouth daily. 12/15/21   [name], MD  risperiDONE (RISPERDAL) 0.5 MG tablet Take 1 tablet (0.5 mg total) by mouth at bedtime. 12/15/21   [name], MD  atenolol (TENORMIN) 50 MG tablet TAKE 1 TABLET EVERY DAY (NEED MD APPOINTMENT FOR REFILLS) Patient taking differently: Take 50 mg by mouth daily.  07/09/18 06/03/19  [name], MD      Allergies    Oxycodone and Tramadol    Review of Systems   Review of Systems  Constitutional:  Negative for chills and fever.  HENT:  Negative for ear pain and sore throat.   Eyes:  Negative for pain and visual disturbance.  Respiratory:  Negative for cough and shortness of breath.   Cardiovascular:  Negative for chest pain and palpitations.  Gastrointestinal:  Positive for abdominal pain and constipation. Negative for vomiting.  Genitourinary:  Negative for dysuria and hematuria.  Musculoskeletal:  Negative for arthralgias and back pain.  Skin:  Negative for color change and rash.  Neurological:  Negative for seizures and syncope.  All other systems reviewed and are negative.   Physical Exam Updated Vital Signs BP 137/87   Pulse 60   Temp 97.7 F (36.5 C) (Oral)   Resp 14   Ht '5\' 8"'$  (1.727 m)   Wt 79.4 kg   SpO2 99%   BMI 26.61 kg/m  Physical Exam Vitals and nursing note reviewed.  Constitutional:      General: He is not in acute distress.    Appearance: He is  well-developed.  HENT:     Head: Normocephalic and atraumatic.  Eyes:     Conjunctiva/sclera: Conjunctivae normal.  Cardiovascular:     Rate and Rhythm: Normal rate and regular rhythm.     Heart sounds: No murmur heard. Pulmonary:     Effort: Pulmonary effort is normal. No respiratory distress.     Breath sounds: Normal breath sounds.  Abdominal:     Palpations: Abdomen is soft.     Tenderness: There is generalized abdominal tenderness. There is no guarding or rebound.  Musculoskeletal:        General: No swelling.     Cervical back: Neck supple.  Skin:    General: Skin is warm and dry.     Capillary Refill: Capillary refill takes less than 2 seconds.  Neurological:     Mental Status: He is alert.  Psychiatric:        Mood and Affect: Mood normal.     ED Results / Procedures / Treatments   Labs (all labs ordered are listed, but only abnormal results are displayed) Labs Reviewed  CBC WITH DIFFERENTIAL/PLATELET  COMPREHENSIVE METABOLIC PANEL    EKG None  Radiology No results found.  Procedures Procedures  {Document cardiac monitor, telemetry assessment procedure when appropriate:1}  Medications Ordered in ED Medications - No data to display  ED Course/ Medical Decision Making/ A&P   {   Click here for ABCD2, HEART and other calculatorsREFRESH Note before signing :1}                          Medical Decision Making Amount and/or Complexity of Data Reviewed Labs: ordered. Radiology: ordered.   54:35 AM 82 year old male with past medical history of diabetes and mild neurocognitive disorder presenting for complaints of generalized abdominal pain.  Patient is alert and oriented x 3, no acute distress, afebrile, stable vital signs.  Abdomen is soft with minimal tenderness in all quadrants.  No guarding or rigidity.  No distention.     {Document critical care time when appropriate:1} {Document review of labs and clinical decision tools ie heart score,  Chads2Vasc2 etc:1}  {Document your independent review of radiology images, and any outside records:1} {Document your discussion with family members, caretakers, and with consultants:1} {Document social determinants of health affecting pt's care:1} {Document your decision making why or why not admission, treatments were needed:1} Final Clinical Impression(s) / ED Diagnoses Final diagnoses:  Constipation, unspecified constipation type    Rx / DC Orders ED Discharge Orders     None

## 2022-04-01 NOTE — ED Triage Notes (Signed)
BIB spouse from home for constipation, difficult bowel movements despite temporizing measures at home, including laxatives, enema, and self disimpaction. Alert, NAD, calm, interactive.

## 2022-04-01 NOTE — ED Notes (Signed)
To CT via w/c.

## 2022-04-01 NOTE — ED Notes (Signed)
EDP at BS 

## 2022-04-03 ENCOUNTER — Ambulatory Visit (INDEPENDENT_AMBULATORY_CARE_PROVIDER_SITE_OTHER): Payer: Medicare Other | Admitting: Family Medicine

## 2022-04-03 ENCOUNTER — Encounter: Payer: Self-pay | Admitting: Family Medicine

## 2022-04-03 ENCOUNTER — Telehealth: Payer: Self-pay | Admitting: Internal Medicine

## 2022-04-03 ENCOUNTER — Telehealth: Payer: Self-pay

## 2022-04-03 VITALS — BP 110/78 | HR 69 | Temp 97.5°F | Wt 178.0 lb

## 2022-04-03 DIAGNOSIS — K5900 Constipation, unspecified: Secondary | ICD-10-CM | POA: Diagnosis not present

## 2022-04-03 NOTE — Progress Notes (Signed)
   Subjective:    Patient ID: Eric Richmond, male    DOB: [birth year], 82 y.o.   MRN: [id number]  HPI Here with his wife to follow up on an ED visit on 04-01-22 for generalized abdominal pain and constipation. This had been going on for about a week. He normally takes Miralax once every day and Colace twice every day, but he still tends to have dry stools that are difficult to pass. At the ED visit he denied any nausea or vomiting or fever. No blood in the stools. On exam he had mild generalized tenderness over the abdomen but he was not distended. Labs were normal. A CT scan revealed a moderate burden of stool throughout the colon, with a 7 cm build up in the rectum. They gave him 2 doses of Golytely, to take one when he got home and then take a second one 15 minutes later. After doping this he did have a moderate sized BM last evening. He has passed 2 small stools today. His abdominal pain has resolved.    Review of Systems  Constitutional: Negative.   Respiratory: Negative.    Cardiovascular: Negative.   Gastrointestinal:  Positive for abdominal pain. Negative for abdominal distention, anal bleeding, blood in stool, constipation, diarrhea, nausea, rectal pain and vomiting.  Genitourinary: Negative.        Objective:   Physical Exam Constitutional:      Appearance: Normal appearance. He is not ill-appearing.  Cardiovascular:     Rate and Rhythm: Normal rate and regular rhythm.     Pulses: Normal pulses.     Heart sounds: Normal heart sounds.  Pulmonary:     Effort: Pulmonary effort is normal.     Breath sounds: Normal breath sounds.  Abdominal:     General: Abdomen is flat. Bowel sounds are normal. There is no distension.     Palpations: Abdomen is soft. There is no mass.     Tenderness: There is no abdominal tenderness. There is no right CVA tenderness, left CVA tenderness, guarding or rebound.     Hernia: No hernia is present.  Neurological:     Mental Status: He is alert.            Assessment & Plan:  Chronic constipation. He will continue to take the Miralax daily and the Colace BID. We will also have him take Milk of Magnesia one tablespoon TID. They will report back to [place] in one week. We spent a total of ( 33  ) minutes reviewing records and discussing these issues.  Alysia Penna, MD

## 2022-04-03 NOTE — Telephone Encounter (Signed)
Spoke with Eric Richmond advised Dr. Annamaria Boots did not order sleep study np [name] did she states that provider is currently with there office. I advised Dr. Annamaria Boots had stated he didn't get those results since he did not order the study. I also advised the patient was called several times In regards to this and he never answered. Eric Richmond stated she will try and call [facility] to see if patient had study completed there. Nothing further needed.

## 2022-04-18 NOTE — Telephone Encounter (Signed)
Spoke with pt pharmacy regarding pt refills on his Rx Risperidone, stated that pt has enough refills but medication is on back order and should be available on Friday 04/20/22. Pt spouse was notified. Attempted several times to call [facility] regarding pt sleep study report with no success of speaking to an agent, will try again tomorrow.

## 2022-04-20 ENCOUNTER — Encounter: Payer: Self-pay | Admitting: Physician Assistant

## 2022-04-20 ENCOUNTER — Ambulatory Visit: Payer: Medicare Other | Admitting: Physician Assistant

## 2022-04-20 VITALS — BP 100/66 | HR 98 | Ht 68.0 in | Wt 178.0 lb

## 2022-04-20 DIAGNOSIS — G301 Alzheimer's disease with late onset: Secondary | ICD-10-CM

## 2022-04-20 DIAGNOSIS — F02818 Dementia in other diseases classified elsewhere, unspecified severity, with other behavioral disturbance: Secondary | ICD-10-CM

## 2022-04-20 NOTE — Patient Instructions (Addendum)
It was a pleasure to see you today at our office.   Recommendations:  Follow up in   months Continue Memantine 10 mg twice daily.Side effects were discussed  For guidance regarding [facility] and if placement were needed at the facility, contact Arnell Asal, Social Worker tel: [phone] For psychiatric meds, mood meds: Please have your primary care physician manage these medications.   Whom to call:  Memory  decline, memory medications: Call our office [phone]      For assessment of decision of mental capacity and competency:  Call Dr. Anthoney Harada, geriatric psychiatrist at [phone]      If you have any severe symptoms of a stroke, or other severe issues such as confusion,severe chills or fever, etc call 911 or go to the ER as you may need to be evaluated further   Feel free to visit Facebook page " [email or web address]" for tips of how to care for people with memory problems.      RECOMMENDATIONS FOR ALL PATIENTS WITH MEMORY PROBLEMS: 1. Continue to exercise (Recommend 30 minutes of walking everyday, or 3 hours every week) 2. Increase social interactions - continue going to [place] and enjoy social gatherings with friends and family 3. Eat healthy, avoid fried foods and eat more fruits and vegetables 4. Maintain adequate blood pressure, blood sugar, and blood cholesterol level. Reducing the risk of stroke and cardiovascular disease also helps promoting better memory. 5. Avoid stressful situations. Live a simple life and avoid aggravations. Organize your time and prepare for the next day in anticipation. 6. Sleep well, avoid any interruptions of sleep and avoid any distractions in the bedroom that may interfere with adequate sleep quality 7. Avoid sugar, avoid sweets as there is a strong link between excessive sugar intake, diabetes, and cognitive impairment We discussed the Mediterranean diet, which has been shown to help patients reduce the risk of  progressive memory disorders and reduces cardiovascular risk. This includes eating fish, eat fruits and green leafy vegetables, nuts like almonds and hazelnuts, walnuts, and also use olive oil. Avoid fast foods and fried foods as much as possible. Avoid sweets and sugar as sugar use has been linked to worsening of memory function.  There is always a concern of gradual progression of memory problems. If this is the case, then we may need to adjust level of care according to patient needs. Support, both to the patient and caregiver, should then be put into place.    The Alzheimer's Association is here all day, every day for people facing Alzheimer's disease through our free 24/7 Helpline: [phone]. The Helpline provides reliable information and support to all those who need assistance, such as individuals living with memory loss, Alzheimer's or other dementia, caregivers, health care professionals and the public.  Our highly trained and knowledgeable staff can help you with: Understanding memory loss, dementia and Alzheimer's  Medications and other treatment options  General information about aging and brain health  Skills to provide quality care and to find the best care from professionals  Legal, financial and living-arrangement decisions Our Helpline also features: Confidential care consultation provided by master's level clinicians who can help with decision-making support, crisis assistance and education on issues families face every day  Help in a caller's preferred language using our translation service that features more than 200 languages and dialects  Referrals to local community programs, services and ongoing support     FALL PRECAUTIONS: Be cautious when walking. Scan the area for obstacles  that may increase the risk of trips and falls. When getting up in the mornings, sit up at the edge of the bed for a few minutes before getting out of bed. Consider elevating the bed at the head  end to avoid drop of blood pressure when getting up. Walk always in a well-lit room (use night lights in the walls). Avoid area rugs or power cords from appliances in the middle of the walkways. Use a walker or a cane if necessary and consider physical therapy for balance exercise. Get your eyesight checked regularly.  FINANCIAL OVERSIGHT: Supervision, especially oversight when making financial decisions or transactions is also recommended.  HOME SAFETY: Consider the safety of the kitchen when operating appliances like stoves, microwave oven, and blender. Consider having supervision and share cooking responsibilities until no longer able to participate in those. Accidents with firearms and other hazards in the house should be identified and addressed as well.   ABILITY TO BE LEFT ALONE: If patient is unable to contact 911 operator, consider using LifeLine, or when the need is there, arrange for someone to stay with patients. Smoking is a fire hazard, consider supervision or cessation. Risk of wandering should be assessed by caregiver and if detected at any point, supervision and safe proof recommendations should be instituted.  MEDICATION SUPERVISION: Inability to self-administer medication needs to be constantly addressed. Implement a mechanism to ensure safe administration of the medications.   DRIVING: Regarding driving, in patients with progressive memory problems, driving will be impaired. We advise to have someone else do the driving if trouble finding directions or if minor accidents are reported. Independent driving assessment is available to determine safety of driving.   If you are interested in the driving assessment, you can contact the following:  [employer] in [place]  [place] [phone] or [phone]      [place] refers  to food and lifestyle choices that are based on the traditions of countries located on the [employer]. This way of eating has been shown to help prevent certain conditions and improve outcomes for people who have chronic diseases, like kidney disease and heart disease. What are tips for following this plan? Lifestyle  Cook and eat meals together with your family, when possible. Drink enough fluid to keep your urine clear or pale yellow. Be physically active every day. This includes: Aerobic exercise like running or swimming. Leisure activities like gardening, walking, or housework. Get 7-8 hours of sleep each night. If recommended by your health care provider, drink red wine in moderation. This means 1 glass a day for nonpregnant women and 2 glasses a day for men. A glass of wine equals 5 oz (150 mL). Reading food labels  Check the serving size of packaged foods. For foods such as rice and pasta, the serving size refers to the amount of cooked product, not dry. Check the total fat in packaged foods. Avoid foods that have saturated fat or trans fats. Check the ingredients list for added sugars, such as corn syrup. Shopping  At the grocery store, buy most of your food from the areas near the walls of the store. This includes: Fresh fruits and vegetables (produce). Grains, beans, nuts, and seeds. Some of these may be available in unpackaged forms or large amounts (in bulk). Fresh seafood. Poultry and eggs. Low-fat dairy products. Buy whole ingredients instead of prepackaged foods. Buy fresh fruits and  vegetables in-season from local farmers markets. Buy frozen fruits and vegetables in resealable bags. If you do not have access to quality fresh seafood, buy precooked frozen shrimp or canned fish, such as tuna, salmon, or sardines. Buy small amounts of raw or cooked vegetables, salads, or olives from the deli or salad bar at your store. Stock your pantry so you always have certain foods  on hand, such as olive oil, canned tuna, canned tomatoes, rice, pasta, and beans. Cooking  Cook foods with extra-virgin olive oil instead of using butter or other vegetable oils. Have meat as a side dish, and have vegetables or grains as your main dish. This means having meat in small portions or adding small amounts of meat to foods like pasta or stew. Use beans or vegetables instead of meat in common dishes like chili or lasagna. Experiment with different cooking methods. Try roasting or broiling vegetables instead of steaming or sauteing them. Add frozen vegetables to soups, stews, pasta, or rice. Add nuts or seeds for added healthy fat at each meal. You can add these to yogurt, salads, or vegetable dishes. Marinate fish or vegetables using olive oil, lemon juice, garlic, and fresh herbs. Meal planning  Plan to eat 1 vegetarian meal one day each week. Try to work up to 2 vegetarian meals, if possible. Eat seafood 2 or more times a week. Have healthy snacks readily available, such as: Vegetable sticks with hummus. Greek yogurt. Fruit and nut trail mix. Eat balanced meals throughout the week. This includes: Fruit: 2-3 servings a day Vegetables: 4-5 servings a day Low-fat dairy: 2 servings a day Fish, poultry, or lean meat: 1 serving a day Beans and legumes: 2 or more servings a week Nuts and seeds: 1-2 servings a day Whole grains: 6-8 servings a day Extra-virgin olive oil: 3-4 servings a day Limit red meat and sweets to only a few servings a month What are my food choices? Mediterranean diet Recommended Grains: Whole-grain pasta. Brown rice. Bulgar wheat. Polenta. Couscous. Whole-wheat bread. [place]. Vegetables: Artichokes. Beets. Broccoli. Cabbage. Carrots. Eggplant. Green beans. Chard. Kale. Spinach. Onions. Leeks. Peas. Squash. Tomatoes. Peppers. Radishes. Fruits: Apples. Apricots. Avocado. Berries. Bananas. Cherries. Dates. Figs. Grapes. Lemons. Melon. Oranges. Peaches.  Plums. Pomegranate. Meats and other protein foods: Beans. Almonds. Sunflower seeds. Pine nuts. Peanuts. [place]. Salmon. Scallops. Shrimp. [place]. Tilapia. Clams. Oysters. Eggs. Dairy: Low-fat milk. Cheese. Greek yogurt. Beverages: Water. Red wine. Herbal tea. Fats and oils: Extra virgin olive oil. Avocado oil. Grape seed oil. Sweets and desserts: [place] yogurt with honey. Baked apples. Poached pears. Trail mix. Seasoning and other foods: Basil. Cilantro. Coriander. Cumin. Mint. Parsley. Sage. Rosemary. Tarragon. Garlic. Oregano. Thyme. Pepper. Balsalmic vinegar. Tahini. Hummus. Tomato sauce. Olives. Mushrooms. Limit these Grains: Prepackaged pasta or rice dishes. Prepackaged cereal with added sugar. Vegetables: Deep fried potatoes (french fries). Fruits: Fruit canned in syrup. Meats and other protein foods: Beef. Pork. Lamb. Poultry with skin. Hot dogs. [place] Salines. Dairy: Ice cream. Sour cream. Whole milk. Beverages: Juice. Sugar-sweetened soft drinks. Beer. Liquor and spirits. Fats and oils: Butter. Canola oil. Vegetable oil. Beef fat (tallow). Lard. Sweets and desserts: Cookies. Cakes. Pies. Candy. Seasoning and other foods: Mayonnaise. Premade sauces and marinades. The items listed may not be a complete list. Talk with your dietitian about what dietary choices are right for you. Summary The Mediterranean diet includes both food and lifestyle choices. Eat a variety of fresh fruits and vegetables, beans, nuts, seeds, and whole grains. Limit the amount of red meat  and sweets that you eat. Talk with your health care provider about whether it is safe for you to drink red wine in moderation. This means 1 glass a day for nonpregnant women and 2 glasses a day for men. A glass of wine equals 5 oz (150 mL). This information is not intended to replace advice given to you by your health care provider. Make sure you discuss any questions you have with your health care provider. Document Released: 10/06/2015  Document Revised: 11/08/2015 Document Reviewed: 10/06/2015 Elsevier Interactive Patient Education  2017 [employer].

## 2022-04-20 NOTE — Progress Notes (Signed)
Assessment/Plan:   Late onset Alzheimer's dementia with behavioral disturbance  Eric Richmond is a very pleasant 82 y.o. RH male with a history of hypertension, hyperlipidemia, sleep apnea noncompliant with CPAP, HOH, seen today in follow up for memory loss.  He had a recent diagnosis of dementia likely due to Alzheimer's disease with behavioral disturbance.  Patient is currently on memantine 10 mg twice daily, tolerating well.  He is overall stable from the cognitive standpoint.  Recommendations   Follow up in 6  months. Continue memantine 10 mg twice daily Recommend using CPAP for OSA Recommend good control of cardiovascular risk factors Continue to control mood as per PCP.    Subjective:    This patient is accompanied in the office by his wife  who supplements the history.  Previous records as well as any outside records available were reviewed prior to todays visit. Patient was last seen on 10/18/21.  Last MMSE on February 2023 was 14/30.   Any changes in memory since last visit? "It's a problem"-he says he has difficulties understanding instructions as before.  Patient has some difficulty remembering recent conversations and people names.  He does not do crossword puzzles .  He likes to play pool. repeats oneself?  Endorsed Disoriented when walking into a room?  Patient denies "sometimes he may get confused about where he lives".  Leaving objects in unusual places?  denies   Wandering behavior?  denies   Any personality changes since last visit?  denies   Any worsening depression?:  denies   Hallucinations or paranoia?  As before, he "talks to people all day long, worse at night".  However this is not frightening to him..  Seizures?    denies    Any sleep changes?  He sleeps well, has vivid dreams and REM behavior, denies sleepwalking   Sleep apnea?  Endorsed, he is noncompliant with the CPAP, this is followed by Dr. Annamaria Boots Any hygiene concerns?    denies   Independent of  bathing and dressing?  Endorsed  Does the patient needs help with medications?  Wife is in charge  Who is in charge of the finances?  Wife   is in charge    Any changes in appetite?  As before, he has decreased appetite "because he forgets to eat ". He waits for his wife to eat . he does not drink enough water. Patient have trouble swallowing?  denies   Does the patient cook? No  Any headaches?   denies   Chronic back pain  denies   Ambulates with difficulty?  denies.  [facility] Recent falls or head injuries? denies     Unilateral weakness, numbness or tingling?    denies   Any tremors?  No Past visit, the patient was reporting sudden jerking episodes while in bed although improved with CPAP use.  EEG at that time was negative for seizures. Any anosmia?  Patient denies   Any incontinence of urine?  denies   Any bowel dysfunction?   He presented to the ED on 04/01/2022 due to constipation not reacting to home laxatives, enema ordered self disimpaction.  He had a CT of the abdomen without obstruction, but he did have a massive amount of stool in the rectum which had to be disimpacted.   No further issues, he takes stool softeners since. Patient lives with his wife Does the patient drive?no longer drives      History on Initial  Assessment 04/25/2018: This is a very pleasant 82 year old right-handed man with a history of hypertension, sleep apnea on CPAP, presenting for evaluation of worsening memory. He states his memory is not as good as it had been. His wife started noticing memory changes around 6 months ago, noting that he is very forgetful. He forgets where he puts things or does not remember the meal he ate prior. She feels his forgetfulness is just more than usual. He occasionally repeats himself. They both agree that he is good with managing his own medications, he rarely forgets them. 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 He denies any difficulties with driving and work. He got lost briefly a week ago in an unfamiliar road, he got "a little excited" but was able to find his way back within 5 minutes. His wife denies any other driving concerns. Wife manages finances. He is independent with dressing and bathing. His wife has noticed he is a little more stressed out and "gets really tight" when he cannot find something. No paranoia or hallucinations. Sleep is good with CPAP, no wandering behavior.   He has frontal headaches when using a machine with smoke coming out. He has occasional dizziness when standing. He has occasional back pain from a pinched nerve. He has had a decreased sense of smell for some time. No diplopia, dysarthria/dysphagia, neck pain, focal numbness/tingling/weakness, bowel/bladder dysfunction, tremors. His father had memory issues. He occasionally drinks a little wine. No significant head injuries. Denies headaches, double vision, dizziness, focal numbness or tingling, unilateral weakness, tremors or anosmia. No history of seizures. Denies urine incontinence, retention, constipation or diarrhea.    PREVIOUS MEDICATIONS:   CURRENT MEDICATIONS:  Outpatient Encounter Medications as of 04/20/2022  Medication Sig   albuterol (PROAIR HFA) 108 (90 Base) MCG/ACT inhaler Inhale 2 puffs into the lungs every 4 (four) hours as needed for wheezing or shortness of breath.   amLODipine (NORVASC) 10 MG tablet Take 0.5 tablets (5 mg total) by mouth daily.   ascorbic acid (VITAMIN C) 500 MG tablet Take 1 tablet (500 mg total) by mouth daily.   aspirin EC 81 MG tablet Take 81 mg by mouth daily.   diazepam (VALIUM) 2 MG tablet TAKE 1 TABLET BY MOUTH EVERY 8 HOURS AS NEEDED FOR  DIZZINESS   docusate sodium (COLACE) 100 MG capsule Take 1 capsule (100 mg total) by mouth 2 (two) times daily.   escitalopram (LEXAPRO) 20 MG tablet TAKE 1 TABLET BY MOUTH  DAILY   fluticasone (FLONASE) 50 MCG/ACT nasal spray Place 2  sprays into both nostrils daily.   furosemide (LASIX) 20 MG tablet Take 1 tablet (20 mg total) by mouth daily.   Iron, Ferrous Sulfate, 325 (65 Fe) MG TABS Take 325 mg by mouth daily.   losartan-hydrochlorothiazide (HYZAAR) 50-12.5 MG tablet Take 1 tablet by mouth every evening.   memantine (NAMENDA) 10 MG tablet Take 1 tablet (10 mg total) by mouth 2 (two) times daily.   Multiple Vitamin (MULTIVITAMIN WITH MINERALS) TABS tablet Take 1 tablet by mouth daily.   pantoprazole (PROTONIX) 40 MG tablet Take 1 tablet (40 mg total) by mouth daily.   polyethylene glycol (MIRALAX) 17 g packet Take 17 g by mouth daily for 20 days.   risperiDONE (RISPERDAL) 0.5 MG tablet Take 1 tablet (0.5 mg total) by mouth at bedtime.   [DISCONTINUED] atenolol (TENORMIN) 50 MG tablet TAKE 1 TABLET EVERY DAY (NEED MD  APPOINTMENT FOR REFILLS) (Patient taking differently: Take 50 mg by mouth daily. )   No facility-administered encounter medications on file as of 04/20/2022.       04/07/2021   11:00 AM 10/05/2020    8:00 AM 08/13/2017    5:04 PM  MMSE - Mini Mental State Exam  Orientation to time 0 3 4  Orientation to Place '2 5 5  '$ Registration '3 3 3  '$ Attention/ Calculation 0 2 4  Recall 3 0 2  Language- name 2 objects '2 2 2  '$ Language- repeat 0 1 1  Language- follow 3 step command '3 1 3  '$ Language- read & follow direction '1 1 1  '$ Write a sentence 0 0 1  Copy design 0 0 0  Total score '14 18 26      '$ 12/02/2018   11:00 AM 04/25/2018    9:00 AM  Montreal Cognitive Assessment   Visuospatial/ Executive (0/5) 1 1  Naming (0/3) 2 3  Attention: Read list of digits (0/2) 1 1  Attention: Read list of letters (0/1) 1 1  Attention: Serial 7 subtraction starting at 100 (0/3) 2 1  Language: Repeat phrase (0/2) 1 1  Language : Fluency (0/1) 0 0  Abstraction (0/2) 0 0  Delayed Recall (0/5) 0 0  Orientation (0/6) 4 2  Total 12 10  Adjusted Score (based on education) 13 11    Objective:     PHYSICAL EXAMINATION:     VITALS:   Vitals:   04/20/22 0911  BP: 100/66  Pulse: 98  SpO2: 97%  Weight: 178 lb (80.7 kg)  Height: '5\' 8"'$  (1.727 m)    GEN:  The patient appears stated age and is in NAD. HEENT:  Normocephalic, atraumatic.   Neurological examination:  General: NAD, well-groomed, appears stated age. Orientation: The patient is alert. Oriented to person, place and not the date (the year is 2014) Cranial nerves: There is good facial symmetry.The speech is fluent and clear. No aphasia or dysarthria. Fund of knowledge is appropriate. Recent and remote memory are impaired. Attention and concentration are reduced.  Able to name objects and repeat phrases.  Hearing is intact to conversational tone.    Sensation: Sensation is intact to light touch throughout Motor: Strength is at least antigravity x4. DTR's 2/4 in UE/LE     Movement examination: Tone: There is normal tone in the UE/LE Abnormal movements:  no tremor.  No myoclonus.  No asterixis.   Coordination:  There is no decremation with RAM's. Normal finger to nose  Gait and Station: The patient has no difficulty arising out of a deep-seated chair without the use of the hands. The patient's stride length is good.  Gait is cautious and narrow.    Thank you for allowing [place] the opportunity to participate in the care of this nice patient. Please do not hesitate to contact us for any questions or concerns.   Total time spent on today's visit was 35 minutes dedicated to this patient today, preparing to see patient, examining the patient, ordering tests and/or medications and counseling the patient, documenting clinical information in the EHR or other health record, independently interpreting results and communicating results to the patient/family, discussing treatment and goals, answering patient's questions and coordinating care.  Cc:  [name], MD  Sharene Butters 04/20/2022 9:41 AM

## 2022-04-30 ENCOUNTER — Encounter: Payer: Self-pay | Admitting: Nurse Practitioner

## 2022-04-30 ENCOUNTER — Ambulatory Visit: Payer: Medicare Other | Admitting: Nurse Practitioner

## 2022-04-30 VITALS — BP 122/68 | HR 85 | Temp 98.0°F | Ht 68.0 in | Wt 177.8 lb

## 2022-04-30 DIAGNOSIS — G4733 Obstructive sleep apnea (adult) (pediatric): Secondary | ICD-10-CM

## 2022-04-30 DIAGNOSIS — G309 Alzheimer's disease, unspecified: Secondary | ICD-10-CM

## 2022-04-30 DIAGNOSIS — F0280 Dementia in other diseases classified elsewhere without behavioral disturbance: Secondary | ICD-10-CM | POA: Diagnosis not present

## 2022-04-30 NOTE — Patient Instructions (Addendum)
Try side lying sleeping position or use a wedge pillow to keep your head elevated when you sleep   Your last seep study showed mild to moderate sleep apnea. I understand you are having trouble wearing your CPAP at night due to memory problems. We discussed trying an oral appliance but we agreed that you would probably have the same troubles with this. We will plan to move forward with positional sleeping as a treatment option.  We discussed how untreated sleep apnea puts an individual at risk for cardiac arrhthymias, pulm HTN, DM, stroke and increases their risk for daytime accidents. We also briefly reviewed treatment options including weight loss, side sleeping position, oral appliance, CPAP therapy or referral to ENT for possible surgical options  Follow up in one year or sooner with Eric Richmond or Eric Richmond, or sooner, if needed

## 2022-04-30 NOTE — Assessment & Plan Note (Signed)
Mild to moderate OSA with AHI 14.9/h. He has ongoing issues with CPAP therapy due to Alzheimer's with progressive memory loss. They struggle to keep his CPAP on at night. We did review alternative treatment options today. I think he will have similar problems with an oral appliance. Based on his in lab study and his wife reports, most of his events occur in supine position. We decided to move forward with positional sleeping as the most appropriate treatment option with his current status. Reviewed measures to ensure he stays on his side during the night or to elevate HOB 30 degrees. Risks of untreated OSA reviewed. Limited on treatment options due to cognitive status.  Patient Instructions  Try side lying sleeping position or use a wedge pillow to keep your head elevated when you sleep   Your last seep study showed mild to moderate sleep apnea. I understand you are having trouble wearing your CPAP at night due to memory problems. We discussed trying an oral appliance but we agreed that you would probably have the same troubles with this. We will plan to move forward with positional sleeping as a treatment option.  We discussed how untreated sleep apnea puts an individual at risk for cardiac arrhthymias, pulm HTN, DM, stroke and increases their risk for daytime accidents. We also briefly reviewed treatment options including weight loss, side sleeping position, oral appliance, CPAP therapy or referral to ENT for possible surgical options  Follow up in one year or sooner with Dr Melvyn Novas or Alanson Aly, or sooner, if needed

## 2022-04-30 NOTE — Progress Notes (Signed)
$'@Patient'O$  ID: Verne Carrow, male    DOB: 1940/11/24, 82 y.o.   MRN: [id number]  Chief Complaint  Patient presents with   Follow-up    Patient advises he is not getting good sleep with cpap machine.    Referring provider: [name], MD  HPI: 82 year old male, former smoker (20 year hx) followed for obstructive sleep apnea on CPAP. He is a patient of Dr. Gustavus Bryant and followed by Dr. Halford Chessman for sleep. Last seen in office on 04/14/2021 by Savilla Turbyfill NP. Past medical history significant for HTN, GERD, DM, Alzheimer's, seasonal allergies, vertigo.  TEST/EVENTS:  2005 NPSG: AHI 66/hr - severe OSA 04/11/2021 HST: AHI 19/hr, SpO2 low 78%, >458.5 min spent below 88%. Moderate OSA with sleep related hypoxia/hypoventilation   11/29/2020: OV with Dr. Melvyn Novas. Doing well. Needed new equipment. Plans for HST with f/u afterwards.   04/14/2021: OV with Freddy Kinne NP to review HST results which showed moderate OSA and significant amount of time spent below 88%. Breathing is stable. He has early stages of Alzheimer's and his wife reports that he is having issues with tolerating his CPAP machine and leaving it on at night. She says he takes it off multiple times a night and she has to reapply it for him. She also said he is taking it off between 3-4 AM every night and then going back to sleep and will not put it back on. They both say that his machine is old and he feels like there are some issues with the humidification portion. They have taken it for troubleshooting but were told there was nothing to be done. He goes to bed around 9-10 PM. He denies any daytime fatigue symptoms. He no longer drives.   [facility] Download: CPAP 11 cmH2O 27/30 days used, 83% >4 hours AV usage 7 hr 18 min Leaks 18.3 L/min, 95th 50.2 L/min AHI 2.3/hr   04/30/2022: Today - follow up Patient presents today for follow up with his wife. His wife helps to provide his history. Since he was here last, his dementia has worsened. He did have an in lab  sleep study in July, ordered by another provider. He had mild to moderate OSA with AHI 14.9/h. He did not remember having this done or being on CPAP therapy before when we discussed it today. His wife tells me they stopped using it because he was taking it off frequently throughout the night and she couldn't get him to keep it on. She notices snoring and some apneic events only when he lays on his back. He does nap occasionally during the day but she has not noticed him being more sleepy since he stopped using it. He is no longer driving. Denies any sleep parasomnias.   Allergies  Allergen Reactions   Oxycodone Other (See Comments)    Hallucinations    Tramadol Other (See Comments)    Hallucinations     Immunization History  Administered Date(s) Administered   Fluad Quad(high Dose 65+) 12/15/2018   Influenza Split 01/10/2013, 11/18/2019   Influenza Whole 11/21/2009, 01/17/2010   Influenza, High Dose Seasonal PF 11/18/2014, 12/30/2015, 12/12/2016   Influenza,inj,Quad PF,6+ Mos 12/15/2013   Influenza-Unspecified 12/05/2020   Moderna Covid-19 Vaccine Bivalent Booster 41yr & up 12/14/2021   PFIZER(Purple Top)SARS-COV-2 Vaccination 12/05/2020   Pneumococcal Conjugate-13 12/15/2013   Pneumococcal Polysaccharide-23 06/02/2015   Tdap 12/12/2017, 12/16/2017    Past Medical History:  Diagnosis Date   Allergy    Anal fissure 20 years ago  COVID-19 01/2019   Diabetes mellitus without complication ([place]) 0000000   ED (erectile dysfunction)    Essential hypertension 10/22/2006   Qualifier: Diagnosis of  By: Sarajane Jews MD, Ishmael Holter    GERD (gastroesophageal reflux disease)    Hard of hearing    Mild neurocognitive disorder 01/06/2019   OSA (obstructive sleep apnea)    per Dr. Gwenette Greet , cpap -2.0   Osteoarthritis    Right knee and right thumb; s/p total knee replacement   Pneumonia 2015    Tobacco History: Social History   Tobacco Use  Smoking Status Former   Packs/day: 0.30   Years:  20.00   Total pack years: 6.00   Types: Cigarettes   Quit date: 02/26/1977   Years since quitting: 45.2  Smokeless Tobacco Former   Types: Chew   Counseling given: Not Answered   Outpatient Medications Prior to Visit  Medication Sig Dispense Refill   albuterol (PROAIR HFA) 108 (90 Base) MCG/ACT inhaler Inhale 2 puffs into the lungs every 4 (four) hours as needed for wheezing or shortness of breath. 18 g 5   amLODipine (NORVASC) 10 MG tablet Take 0.5 tablets (5 mg total) by mouth daily. 90 tablet 3   ascorbic acid (VITAMIN C) 500 MG tablet Take 1 tablet (500 mg total) by mouth daily. 6 tablet 0   aspirin EC 81 MG tablet Take 81 mg by mouth daily.     diazepam (VALIUM) 2 MG tablet TAKE 1 TABLET BY MOUTH EVERY 8 HOURS AS NEEDED FOR  DIZZINESS 90 tablet 5   docusate sodium (COLACE) 100 MG capsule Take 1 capsule (100 mg total) by mouth 2 (two) times daily. 60 capsule 11   escitalopram (LEXAPRO) 20 MG tablet TAKE 1 TABLET BY MOUTH  DAILY 90 tablet 3   fluticasone (FLONASE) 50 MCG/ACT nasal spray Place 2 sprays into both nostrils daily. 48 g 3   furosemide (LASIX) 20 MG tablet Take 1 tablet (20 mg total) by mouth daily. 90 tablet 3   Iron, Ferrous Sulfate, 325 (65 Fe) MG TABS Take 325 mg by mouth daily. 90 tablet 3   losartan-hydrochlorothiazide (HYZAAR) 50-12.5 MG tablet Take 1 tablet by mouth every evening. 90 tablet 3   memantine (NAMENDA) 10 MG tablet Take 1 tablet (10 mg total) by mouth 2 (two) times daily. 180 tablet 3   Multiple Vitamin (MULTIVITAMIN WITH MINERALS) TABS tablet Take 1 tablet by mouth daily.     pantoprazole (PROTONIX) 40 MG tablet Take 1 tablet (40 mg total) by mouth daily. 90 tablet 3   risperiDONE (RISPERDAL) 0.5 MG tablet Take 1 tablet (0.5 mg total) by mouth at bedtime. 90 tablet 3   No facility-administered medications prior to visit.     Review of Systems:   Constitutional: No weight loss or gain, night sweats, fevers, chills, fatigue, or lassitude. HEENT: No  headaches, difficulty swallowing, tooth/dental problems, or sore throat. No sneezing, itching, ear ache, nasal congestion, or post nasal drip CV:  No chest pain, orthopnea, PND, swelling in lower extremities, anasarca, dizziness, palpitations, syncope Resp: No shortness of breath with exertion or at rest. No excess mucus or change in color of mucus. No productive or non-productive. No hemoptysis. No wheezing.  No chest wall deformity GI:  No heartburn, indigestion, abdominal pain, nausea, vomiting, diarrhea, change in bowel habits, loss of appetite, bloody stools.  GU: No dysuria, change in color of urine, urgency or frequency.   Skin: No rash, lesions, ulcerations MSK:  No joint pain or  swelling.  Neuro: No dizziness or lightheadedness. +memory impairment Psych: No depression or anxiety. Mood stable.     Physical Exam:  BP 122/68 (BP Location: Right Arm, Patient Position: Sitting, Cuff Size: Normal)   Pulse 85   Temp 98 F (36.7 C) (Oral)   Ht '5\' 8"'$  (1.727 m)   Wt 177 lb 12.5 oz (80.6 kg)   SpO2 96%   BMI 27.03 kg/m   GEN: Pleasant, interactive, well-nourished; in no acute distress. HEENT:  Normocephalic and atraumatic. PERRLA. Sclera white. Nasal turbinates pink, moist and patent bilaterally. No rhinorrhea present. Oropharynx pink and moist, without exudate or edema. No lesions, ulcerations, or postnasal drip.  NECK:  Supple w/ fair ROM. No JVD present. Normal carotid impulses w/o bruits. Thyroid symmetrical with no goiter or nodules palpated. No lymphadenopathy.   CV: RRR, no m/r/g, no peripheral edema. Pulses intact, +2 bilaterally. No cyanosis, pallor or clubbing. PULMONARY:  Unlabored, regular breathing. Clear bilaterally A&P w/o wheezes/rales/rhonchi. No accessory muscle use. No dullness to percussion. GI: BS present and normoactive. Soft, non-tender to palpation. No organomegaly or masses detected.  MSK: No erythema, warmth or tenderness. Cap refil <2 sec all extrem. No  deformities or joint swelling noted.  Neuro: A/Ox3. Remote memory impaired with some decreased attention. Skin: Warm, no lesions or rashe Psych: Normal affect and behavior. Judgement and thought content appropriate.     Lab Results:  CBC    Component Value Date/Time   WBC 6.8 04/01/2022 1120   RBC 4.51 04/01/2022 1120   HGB 13.0 04/01/2022 1120   HCT 38.0 (L) 04/01/2022 1120   PLT 156 04/01/2022 1120   MCV 84.3 04/01/2022 1120   MCV 88.5 06/15/2013 2114   MCH 28.8 04/01/2022 1120   MCHC 34.2 04/01/2022 1120   RDW 13.8 04/01/2022 1120   LYMPHSABS 1.7 04/01/2022 1120   MONOABS 0.4 04/01/2022 1120   EOSABS 0.1 04/01/2022 1120   BASOSABS 0.0 04/01/2022 1120    BMET    Component Value Date/Time   NA 138 04/01/2022 1120   K 3.9 04/01/2022 1120   CL 101 04/01/2022 1120   CO2 28 04/01/2022 1120   GLUCOSE 95 04/01/2022 1120   BUN 19 04/01/2022 1120   CREATININE 1.15 04/01/2022 1120   CALCIUM 9.4 04/01/2022 1120   GFRNONAA >60 04/01/2022 1120   GFRAA >60 05/02/2019 1428    BNP No results found for: "BNP"   Imaging:  CT ABDOMEN PELVIS W CONTRAST  Result Date: 04/01/2022 CLINICAL DATA:  Abdominal pain. EXAM: CT ABDOMEN AND PELVIS WITH CONTRAST TECHNIQUE: Multidetector CT imaging of the abdomen and pelvis was performed using the standard protocol following bolus administration of intravenous contrast. RADIATION DOSE REDUCTION: This exam was performed according to the departmental dose-optimization program which includes automated exposure control, adjustment of the mA and/or kV according to patient size and/or use of iterative reconstruction technique. CONTRAST:  69m OMNIPAQUE IOHEXOL 300 MG/ML  SOLN COMPARISON:  June 18, 2007 FINDINGS: Lower chest: No acute abnormality. Hepatobiliary: No focal liver abnormality is seen. No gallstones, gallbladder wall thickening, or biliary dilatation. Pancreas: Unremarkable. No pancreatic ductal dilatation or surrounding inflammatory changes.  Spleen: Normal in size without focal abnormality. Adrenals/Urinary Tract: Normal adrenal glands. Multiple bilateral benign-appearing renal cysts as large as 4.8 cm on the left. Normal urinary bladder. Stomach/Bowel: Stomach is within normal limits. Appendix appears normal. No evidence of bowel wall thickening, distention, or inflammatory changes. Diffuse colonic diverticulosis. Moderate amount of formed stool throughout the colon with formed stool  in the rectum which is extended to 7.2 cm Vascular/Lymphatic: Aortic atherosclerosis. No enlarged abdominal or pelvic lymph nodes. Reproductive: Small or surgically absent prostate gland. Other: No abdominal wall hernia or abnormality. No abdominopelvic ascites. Musculoskeletal: No acute or significant osseous findings. IMPRESSION: 1. No evidence of acute abnormalities within the abdomen or pelvis. 2. Diffuse colonic diverticulosis without evidence of diverticulitis. 3. Moderate amount of formed stool throughout the colon with formed stool in the rectum which is extended to 7.2 cm. 4. Multiple bilateral benign-appearing renal cysts, Bosniak 1. 5. Aortic atherosclerosis. Aortic Atherosclerosis (ICD10-I70.0). Electronically Signed   By: Fidela Salisbury M.D.   On: 04/01/2022 13:00          No data to display          No results found for: "NITRICOXIDE"      Assessment & Plan:   OSA (obstructive sleep apnea) Mild to moderate OSA with AHI 14.9/h. He has ongoing issues with CPAP therapy due to Alzheimer's with progressive memory loss. They struggle to keep his CPAP on at night. We did review alternative treatment options today. I think he will have similar problems with an oral appliance. Based on his in lab study and his wife reports, most of his events occur in supine position. We decided to move forward with positional sleeping as the most appropriate treatment option with his current status. Reviewed measures to ensure he stays on his side during the  night or to elevate HOB 30 degrees. Risks of untreated OSA reviewed. Limited on treatment options due to cognitive status.  Patient Instructions  Try side lying sleeping position or use a wedge pillow to keep your head elevated when you sleep   Your last seep study showed mild to moderate sleep apnea. I understand you are having trouble wearing your CPAP at night due to memory problems. We discussed trying an oral appliance but we agreed that you would probably have the same troubles with this. We will plan to move forward with positional sleeping as a treatment option.  We discussed how untreated sleep apnea puts an individual at risk for cardiac arrhthymias, pulm HTN, DM, stroke and increases their risk for daytime accidents. We also briefly reviewed treatment options including weight loss, side sleeping position, oral appliance, CPAP therapy or referral to ENT for possible surgical options  Follow up in one year or sooner with Dr Melvyn Novas or Alanson Aly, or sooner, if needed    Alzheimer disease ([place]) Progressive decline. See above. Follow up with neurology as scheduled.   I spent 28 minutes of dedicated to the care of this patient on the date of this encounter to include pre-visit review of records, face-to-face time with the patient discussing conditions above, post visit ordering of testing, clinical documentation with the electronic health record, making appropriate referrals as documented, and communicating necessary findings to members of the patients care team.  Clayton Bibles, NP 04/30/2022  Pt aware and understands NP's role.

## 2022-04-30 NOTE — Assessment & Plan Note (Signed)
Progressive decline. See above. Follow up with neurology as scheduled.

## 2022-05-10 ENCOUNTER — Ambulatory Visit (INDEPENDENT_AMBULATORY_CARE_PROVIDER_SITE_OTHER): Payer: Medicare Other

## 2022-05-10 ENCOUNTER — Ambulatory Visit (INDEPENDENT_AMBULATORY_CARE_PROVIDER_SITE_OTHER): Payer: Medicare Other | Admitting: Family Medicine

## 2022-05-10 ENCOUNTER — Encounter: Payer: Self-pay | Admitting: Family Medicine

## 2022-05-10 VITALS — BP 110/74 | HR 59 | Temp 97.6°F | Wt 180.0 lb

## 2022-05-10 DIAGNOSIS — M79674 Pain in right toe(s): Secondary | ICD-10-CM

## 2022-05-10 DIAGNOSIS — M7989 Other specified soft tissue disorders: Secondary | ICD-10-CM | POA: Diagnosis not present

## 2022-05-10 NOTE — Telephone Encounter (Signed)
Sleep study report was accessed and Dr Sarajane Jews reviewed report with pt at the office visit today

## 2022-05-10 NOTE — Progress Notes (Signed)
   Subjective:    Patient ID: Eric Richmond, male    DOB: 1940-08-28, 82 y.o.   MRN: [id number]  HPI Here with his wife to check his right great toe. He fell out of bed 2 days ago, but he does not remember much about this. Other than the toe injury, he seems to be alright. The toe is swollen and painful.    Review of Systems  Constitutional: Negative.   Respiratory: Negative.    Cardiovascular: Negative.   Musculoskeletal:  Positive for arthralgias.       Objective:   Physical Exam Constitutional:      Comments: Walks with a cane   Cardiovascular:     Rate and Rhythm: Normal rate and regular rhythm.     Pulses: Normal pulses.     Heart sounds: Normal heart sounds.  Pulmonary:     Effort: Pulmonary effort is normal.     Breath sounds: Normal breath sounds.  Musculoskeletal:     Comments: The right great toe is swollen, red, and tender along the proximal phalanx. There is normal alignment   Neurological:     Mental Status: He is alert.           Assessment & Plan:  Right great toe injury, likely a fracture. He is keeping it elevated and they are applying ice packs. We will get Xrays of the toe today.  Alysia Penna, MD

## 2022-06-11 ENCOUNTER — Other Ambulatory Visit: Payer: Self-pay

## 2022-06-11 DIAGNOSIS — I10 Essential (primary) hypertension: Secondary | ICD-10-CM

## 2022-06-15 ENCOUNTER — Encounter: Payer: Self-pay | Admitting: Family Medicine

## 2022-06-15 ENCOUNTER — Ambulatory Visit (INDEPENDENT_AMBULATORY_CARE_PROVIDER_SITE_OTHER): Payer: Medicare Other | Admitting: Family Medicine

## 2022-06-15 VITALS — BP 110/76 | HR 72 | Temp 97.5°F | Wt 174.4 lb

## 2022-06-15 DIAGNOSIS — R1010 Upper abdominal pain, unspecified: Secondary | ICD-10-CM | POA: Diagnosis not present

## 2022-06-15 DIAGNOSIS — M79674 Pain in right toe(s): Secondary | ICD-10-CM | POA: Diagnosis not present

## 2022-06-15 MED ORDER — METHYLPREDNISOLONE 4 MG PO TBPK: ORAL_TABLET | ORAL | 0 refills | Status: DC

## 2022-06-15 MED ORDER — FAMOTIDINE 40 MG PO TABS: 40.0000 mg | ORAL_TABLET | Freq: Every day | ORAL | 5 refills | Status: DC

## 2022-06-15 NOTE — Progress Notes (Signed)
   Subjective:    Patient ID: Eric Richmond, male    DOB: Jul 03, 1940, 82 y.o.   MRN: [id number]  HPI Here with his wife for 2 issues. First he has continued swelling and pain in the right great toe. He fell out of bed on 05-08-22 and injured this toe. We saw him on 05-10-22 and we got an Xray showing arthritis but no acute changes. We advised him to elevate the leg and to apply ice to the toe. He has been taking ES Tylenol since then, but the toe has never improved. The second issues is upper abdominal pain. These come and go over the past few weeks. No fever or nausea. His BM's have been regular now that he uses Miralax, Colace, and Milk of Magnesia every day. He takes Pantoprazole 40 mg every morning.    Review of Systems  Constitutional: Negative.   Respiratory: Negative.    Cardiovascular: Negative.   Musculoskeletal:  Positive for arthralgias.       Objective:   Physical Exam Constitutional:      Appearance: Normal appearance.     Comments: He walks easily   Cardiovascular:     Rate and Rhythm: Normal rate and regular rhythm.     Pulses: Normal pulses.     Heart sounds: Normal heart sounds.  Pulmonary:     Effort: Pulmonary effort is normal.     Breath sounds: Normal breath sounds.  Abdominal:     General: Abdomen is flat. Bowel sounds are normal. There is no distension.     Palpations: Abdomen is soft. There is no mass.     Tenderness: There is no abdominal tenderness. There is no guarding or rebound.     Hernia: No hernia is present.  Musculoskeletal:     Comments: The right great toe is swollen but not red or warm. He is tender in the IP and the MTP joints.  Neurological:     Mental Status: He is alert.           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 We will treat this with a Medrol dose pack. Recheck as needed. As for the upper abdominal pain, this may be from breakthrough GERD. We will add Famotidine 40 mg every evening before supper. Recheck as  needed.  Gershon Crane, MD

## 2022-07-12 ENCOUNTER — Telehealth: Payer: Self-pay

## 2022-07-12 NOTE — Progress Notes (Signed)
Patient ID: Eric Richmond, male   DOB: May 05, 1940, 82 y.o.   MRN: [id number]  Care Management & Coordination Services Pharmacy Team  Reason for Encounter: Chart prep for initial encounter with Eric Richmond on 07/18/22 at 11 am in office.   Spoke with  Wife  on 07/12/2022   Have you seen any other providers since your last visit?  Wife reports no  Any changes in your medications or health? Wife reports no  Any side effects from any medications? Wife reports he is always dizzy and constipated. She further reports he gets winded very easily and has labored breathing.  Do you have an symptoms or problems not managed by your medications? Wife reports none  Any concerns about your health right now? Wife reports he has dementia and it feels like he is declining. She further states she thinks he is losing too much weight although his eating habits have remained unchanged he still has a good appetite and eats a lot and can not explain the weight loss.  Has your provider asked that you check blood pressure, blood sugar, or follow special diet at home? Wife reports she checks his pressures but not his sugars at home and is in agreement to bring his cuff to the appointment.   Do you get any type of exercise on a regular basis? Wife reports he does very little walking and uses the cane when he goes out. Will take the trash out but is winded after.  Do you have any problems getting your medications? Wife reports they often have delivery issues with [facility] and she ends up filling meds at [facility] that costs them more money. They pay little to nothing with [facility] but she has not figured out how to get his med's delivered on time.  Is there anything that you would like to discuss during the appointment? Wife reports she will be possibly be having surgery in a few months and if so would like information on a possible respite program for someone to be with her husband during and with recovery.   Patient  aware to bring blood pressure cuff, medications that do not need refrigeration and supplements to appointment   Chart review:  Recent office visits:  06/15/22 Nelwyn Salisbury, MD - Patient presented for pain of right great toe and other concerns. Prescribed Famotidine. Prescribed Medrol .   05/10/22  Nelwyn Salisbury, MD - Patient presented for pain of right great toe and other concerns. No medication changes.   04/03/22 Nelwyn Salisbury, MD - Patient presented for Constipation unspecified constipation type. Recommended Milk of Mag TID.  Recent consult visits:  04/30/22 Noemi Chapel, NP - Patient presented for OSA and other concerns. No medication changes.   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 No medication changes.   Hospital visits:  Medication Reconciliation was completed by comparing discharge summary, patient's EMR and Pharmacy list, and upon discussion with patient.  Patient presented to [facility] ED at [facility] on 04/01/21 due to Constipation. Patient was present for 3 hours.  New?Medications Started at [facility] Discharge:?? -started     polyethylene glycol (GOLYTELY) 236 g solution       polyethylene glycol (MIRALAX) 17 g packet     Medication Changes at Hospital Discharge: -Changed  none  Medications Discontinued at Hospital Discharge: -Stopped  none Medications that remain the same after Hospital Discharge:??  -All other medications  will remain the same.    Fill History  : ALBUTEROL SULFATE HFA  108 (90 Base) MCG/ACT AERS 11/23/2021 34   AMLODIPINE BESYLATE  10 MG TABS 10/14/2021 100   DIAZEPAM 2MG         TAB 03/24/2022 30          ESCITALOPRAM OXALATE  20 MG TABS 05/14/2022 90      FAMOTIDINE 40MG  TAB 06/15/2022 30   FERROUS SULF325MG    TAB 06/22/2022 90   FUROSEMIDE 20MG      TAB 03/12/2022 90   LOSARTAN/HCT 50-12.5MG  TAB 03/12/2022 90   MEMANTINE  10MG        TAB 06/14/2022 90   METHYLPRED 4MG       PAK 06/15/2022 6   PANTOPRAZOLE SOD 40MG  TAB 06/29/2022 90   RisperiDONE 0.5MG    TAB 06/22/2022 30   IRON 65MG  TAB 03/07/2022 90   Star Rating Drugs:  Losartan HCTZ 50-12.5 - Last filled  03/12/22 90 DS [facility] Verified    Care Gaps: Foot Exam - Overdue Diabetic Urine - Overdue Zoster Vaccine - Overdue COVID Booster - Overdue Eye Exam - Overdue Colonoscopy - Overdue A1C - Overdue AWV - 12/07/21  Pamala Duffel CMA Clinical Pharmacist Assistant [phone]

## 2022-07-17 NOTE — Progress Notes (Unsigned)
Care Management & Coordination Services Pharmacy Note  07/19/2022 Name:  Eric Richmond MRN:  [id number] DOB:  Dec 27, 1940  Summary: BP at goal <140/90 Constipation with iron use still a concern for patient Wife concerned about upcoming surgery and who can help care for husband while she is recovering  Recommendations/Changes made from today's visit: -CHANGE Ferrous Sulfate to Ferrous GLUCONATE once daily to try and alleviate constipation concerns -ORDER updated iron panel at patient request, with PCP approval -ORDER a referral to social work to assist wife with caregiver needs for patient  Follow up plan: Monthly dispensing pharmacy calls General check in in 2 months Pharmacist visit in October   Subjective: Eric Richmond is an 82 y.o. year old male who is a primary patient of Nelwyn Salisbury, MD.  The care coordination team was consulted for assistance with disease management and care coordination needs.    Engaged with patient face to face for initial visit. Met with patient and wife in office, she acts as caregiver due to his dementia, handles all of his medications. Primary concerns today are constipation and feeling winded after short periods of exertion.  Recent office visits: 06/15/22 Nelwyn Salisbury, MD - Patient presented for pain of right great toe and other concerns. Prescribed Famotidine. Prescribed Medrol .    05/10/22  Nelwyn Salisbury, MD - Patient presented for pain of right great toe and other concerns. No medication changes.    04/03/22 Nelwyn Salisbury, MD - Patient presented for Constipation unspecified constipation type. Recommended Milk of Mag TID.  Recent consult visits: 04/30/22 Noemi Chapel, NP - Patient presented for OSA and other concerns. No medication changes.    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 No medication changes  Hospital visits: 04/01/22 [facility] at  [facility] - For Constipation, LOS 3 hours, START miralax. No other medication changes  Objective:  Lab Results  Component Value Date   CREATININE 1.15 04/01/2022   BUN 19 04/01/2022   GFR 56.18 (L) 12/15/2021   GFRNONAA >60 04/01/2022   GFRAA >60 05/02/2019   NA 138 04/01/2022   K 3.9 04/01/2022   CALCIUM 9.4 04/01/2022   CO2 28 04/01/2022   GLUCOSE 95 04/01/2022    Lab Results  Component Value Date/Time   HGBA1C 6.0 12/15/2021 08:40 AM   HGBA1C 6.1 12/09/2020 08:29 AM   GFR 56.18 (L) 12/15/2021 08:40 AM   GFR 54.42 (L) 12/09/2020 08:29 AM    Last diabetic Eye exam: No results found for: "HMDIABEYEEXA"  Last diabetic Foot exam: No results found for: "HMDIABFOOTEX"   Lab Results  Component Value Date   CHOL 128 12/15/2021   HDL 46.20 12/15/2021   LDLCALC 67 12/15/2021   TRIG 71.0 12/15/2021   CHOLHDL 3 12/15/2021       Latest Ref Rng & Units 04/01/2022   11:20 AM 12/15/2021    8:40 AM 10/09/2021   12:41 PM  Hepatic Function  Total Protein 6.5 - 8.1 g/dL 6.7  6.7  6.3   Albumin 3.5 - 5.0 g/dL 4.1  4.0  3.5   AST 15 - 41 U/L 19  18  25    ALT 0 - 44 U/L 9  11  14    Alk Phosphatase 38 - 126 U/L 58  76  54   Total Bilirubin 0.3 - 1.2 mg/dL 1.0  0.9  0.8   Bilirubin, Direct 0.0 - 0.3 mg/dL  0.2      Lab Results  Component Value Date/Time   TSH 2.54 12/15/2021 08:40 AM   TSH 2.74 12/09/2020 08:29 AM       Latest Ref Rng & Units 04/01/2022   11:20 AM 12/15/2021    8:40 AM 10/09/2021   12:41 PM  CBC  WBC 4.0 - 10.5 K/uL 6.8  6.5  5.2   Hemoglobin 13.0 - 17.0 g/dL 16.1  09.6  04.5   Hematocrit 39.0 - 52.0 % 38.0  36.0  35.8   Platelets 150 - 400 K/uL 156  194.0  159     Lab Results  Component Value Date/Time   VITAMINB12 784 04/25/2018 10:43 AM    Clinical ASCVD: No  The ASCVD Risk score (Arnett DK, et al., 2019) failed to calculate for the following reasons:   The 2019 ASCVD risk score is only valid for ages 60 to 43       06/15/2022   10:47 AM  12/15/2021    8:30 AM 12/07/2021   11:38 AM  Depression screen PHQ 2/9  Decreased Interest 0 0 0  Down, Depressed, Hopeless 0 0 0  PHQ - 2 Score 0 0 0  Altered sleeping 0 0 0  Tired, decreased energy 2 0 0  Change in appetite 2 0 0  Feeling bad or failure about yourself  0 0 0  Trouble concentrating 0 3 0  Moving slowly or fidgety/restless 1 2 0  Suicidal thoughts 0 0 0  PHQ-9 Score 5 5 0  Difficult doing work/chores   Not difficult at all     Social History   Tobacco Use  Smoking Status Former   Packs/day: 0.30   Years: 20.00   Additional pack years: 0.00   Total pack years: 6.00   Types: Cigarettes   Quit date: 02/26/1977   Years since quitting: 45.4  Smokeless Tobacco Former   Types: Chew   BP Readings from Last 3 Encounters:  06/15/22 110/76  05/10/22 110/74  04/30/22 122/68   Pulse Readings from Last 3 Encounters:  06/15/22 72  05/10/22 (!) 59  04/30/22 85   Wt Readings from Last 3 Encounters:  06/15/22 174 lb 6.4 oz (79.1 kg)  05/10/22 180 lb (81.6 kg)  04/30/22 177 lb 12.5 oz (80.6 kg)   BMI Readings from Last 3 Encounters:  06/15/22 26.52 kg/m  05/10/22 27.37 kg/m  04/30/22 27.03 kg/m    Allergies  Allergen Reactions   Oxycodone Other (See Comments)    Hallucinations    Tramadol Other (See Comments)    Hallucinations     Medications Reviewed Today     Reviewed by Sherrill Raring, RPH (Pharmacist) on 07/19/22 at 1150  Med List Status: <None>   Medication Order Taking? Sig Documenting Provider Last Dose Status Informant  albuterol (PROAIR HFA) 108 (90 Base) MCG/ACT inhaler [id number] Yes Inhale 2 puffs into the lungs every 4 (four) hours as needed for wheezing or shortness of breath. Nelwyn Salisbury, MD Taking Active   amLODipine (NORVASC) 10 MG tablet [id number] Yes Take 0.5 tablets (5 mg total) by mouth daily. Nelwyn Salisbury, MD Taking Active   aspirin EC 81 MG tablet [id number] Yes Take 81 mg by mouth daily. [provider] Taking  Active Self   Patient taking differently:   Discontinued 02/23/19 2152 diazepam (VALIUM) 2 MG tablet [id number] Yes TAKE 1 TABLET BY MOUTH EVERY 8 HOURS AS NEEDED FOR  DIZZINESS Nelwyn Salisbury, MD Taking Active  docusate sodium (COLACE) 100 MG capsule [id number] Yes Take 1 capsule (100 mg total) by mouth 2 (two) times daily. Nelwyn Salisbury, MD Taking Active   escitalopram (LEXAPRO) 20 MG tablet [id number] Yes TAKE 1 TABLET BY MOUTH  DAILY Nelwyn Salisbury, MD Taking Active   famotidine (PEPCID) 40 MG tablet [id number] Yes Take 1 tablet (40 mg total) by mouth daily before supper. Nelwyn Salisbury, MD Taking Active   ferrous gluconate (FERGON) 324 MG tablet [id number] Yes Take 324 mg by mouth daily. [provider]  Active   fluticasone (FLONASE) 50 MCG/ACT nasal spray [id number] Yes Place 2 sprays into both nostrils daily. Nelwyn Salisbury, MD Taking Active Self  losartan-hydrochlorothiazide [facility]) 50-12.5 MG tablet [id number] Yes Take 1 tablet by mouth every evening. Nelwyn Salisbury, MD Taking Active   magnesium hydroxide (MILK OF MAGNESIA) 400 MG/5ML suspension [id number] Yes Take by mouth 3 (three) times daily as needed for mild constipation. [provider]  Active Self  memantine (NAMENDA) 10 MG tablet [id number] Yes Take 1 tablet (10 mg total) by mouth 2 (two) times daily. Nelwyn Salisbury, MD Taking Active   Multiple Vitamin (MULTIVITAMIN WITH MINERALS) TABS tablet [id number] Yes Take 1 tablet by mouth daily. [provider] Taking Active Self  pantoprazole (PROTONIX) 40 MG tablet [id number] Yes Take 1 tablet (40 mg total) by mouth daily. Nelwyn Salisbury, MD Taking Active   polyethylene glycol (MIRALAX / GLYCOLAX) 17 g packet [id number] Yes Take 17 g by mouth daily as needed. [provider]  Active Self  risperiDONE (RISPERDAL) 0.5 MG tablet [id number] Yes Take 1 tablet (0.5 mg total) by mouth at bedtime. Nelwyn Salisbury, MD Taking Active             SDOH:  (Social  Determinants of Health) assessments and interventions performed: Yes SDOH Interventions    Flowsheet Row Care Coordination from 07/18/2022 in [facility] Clinical Support from 12/07/2021 in [facility] at [place] Patient Outreach Telephone from 10/04/2021 in [facility] Care Coordination Clinical Support from 12/05/2020 in [facility] [place] Clinical Support from 12/04/2019 in [facility] [place]  SDOH Interventions       Food Insecurity Interventions Intervention Not Indicated Intervention Not Indicated Intervention Not Indicated Intervention Not Indicated Intervention Not Indicated  Housing Interventions Intervention Not Indicated Intervention Not Indicated Intervention Not Indicated Intervention Not Indicated Intervention Not Indicated  Transportation Interventions -- Intervention Not Indicated Intervention Not Indicated Intervention Not Indicated Intervention Not Indicated  Utilities Interventions -- Intervention Not Indicated -- -- --  Alcohol Usage Interventions -- Intervention Not Indicated (Score <7) -- -- --  Depression Interventions/Treatment  -- -- -- -- PHQ2-9 Score <4 Follow-up Not Indicated  Financial Strain Interventions -- Intervention Not Indicated -- Intervention Not Indicated Intervention Not Indicated  Physical Activity Interventions -- Intervention Not Indicated -- Intervention Not Indicated Intervention Not Indicated  Stress Interventions -- Intervention Not Indicated -- Intervention Not Indicated Intervention Not Indicated  Social Connections Interventions -- Intervention Not Indicated -- Intervention Not Indicated Intervention Not Indicated       Medication Assistance: None required.  Patient affirms current coverage meets needs.  Medication Access: Name and location of current pharmacy:  [facility] [address] [place], [place] - [address] [place] [address] Phone: [phone] Fax: [phone]  [facility] Delivery) - [place] - [address] [facility] [place] [phone]  [employer] [address] [place] [address] Phone: [phone] Fax: [phone]  [facility] Delivery - [place], [place] - [address] [place] [address] Phone: [phone] Fax: [phone]  Within the past 30 days, how often has patient missed a dose of medication? Some meds missing due to polypharmacy and mail order delays Is a pillbox or other method used to improve adherence? Yes  Factors that may affect medication adherence?  Pharmacy mail order delays Are meds synced by current pharmacy? No  Are meds delivered by current pharmacy?  Some at [facility], some with mail order Does patient experience delays in picking up medications due to transportation concerns? No   -delivery delays  Verbal consent obtained for [facility] enhanced pharmacy services (medication synchronization, adherence packaging, delivery coordination). A medication sync plan was created to allow patient to get all medications delivered once every 30 to 90 days per patient preference. Patient understands they have freedom to choose pharmacy and clinical pharmacist will coordinate care between all prescribers and [facility].  Compliance/Adherence/Medication fill history: Care Gaps: Foot Exam - Overdue Diabetic Urine - Overdue Zoster Vaccine - Overdue COVID Booster - Overdue Eye Exam - Overdue Colonoscopy - Overdue A1C - Overdue AWV - 12/07/21  [facility]: Losartan HCTZ 50-12.5 - Last filled  03/12/22 90 DS [facility] Verified --med not brought to appt, sch f/u call with wife tmrw to see if medication is at home and being taken   Assessment/Plan Hypertension (BP goal <140/90) -Controlled -Current treatment: Amlodipine 10mg  1/2 qd---Appropriate, Effective, Safe,  Accessible Losartan-HCTZ 50-12.5mg  1 qd Appropriate, Effective, Safe, Accessible -Medications previously tried: Atenolol  -Current home readings: Checking about once a week, no log kept, denies any "readings of concern" -Current dietary habits: mindful of salt intake -Current exercise habits: limited due to becoming winded, struggles with dizziness, walks with assistance of a cane -Denies hypotensive/hypertensive symptoms ---dizziness not associated with low BP, patient has long hx of dizziness -Educated on BP goals and benefits of medications for prevention of heart attack, stroke and kidney damage; Importance of home blood pressure monitoring; Proper BP monitoring technique; Symptoms of hypotension and importance of maintaining adequate hydration; -Counseled to monitor BP at home once weekly, document, and provide log at future appointments -Recommended to continue current medication  Diabetes (A1c goal <7%) -Not assessed today -Current medications: None - controlled with diet -Counseled to check feet daily and get yearly eye exams  Depression/Anxiety/Dizziness (Goal: Achieve a well-controlled mood that allows for ADLs) -Not assessed today -Current treatment: Diazepam 2mg  q 8 h prn dizziness Appropriate, Effective, Safe, Accessible Escitalopram 20mg  1 qd Appropriate, Effective, Safe, Accessible Risperidone 0.5mg  1 qhs Appropriate, Effective, Safe, Accessible  Alzheimer's (Goal: Slow the progression of memory loss) -Not assessed today -Current treatment  Namenda 10mg  1 BID Appropriate, Effective, Safe, Accessible  GERD (Goal: minimize symptoms of reflux ) -Not assessed today -Current treatment  Pantoprazole 40mg  1 qd Appropriate, Effective, Safe, Accessible Famotidine 40mg  1 qd Appropriate, Effective, Safe, Accessible  Constipation (Goal: Prevent obstruction and have regular bowel movements) -Not ideally controlled -Current treatment  Colace 100mg  1 BID Appropriate,  Effective, Safe, Accessible Miralax once daily Appropriate, Effective, Safe, Accessible Milk of Magnesia TID Appropriate, Effective, Safe, Accessible -Medications previously tried: None -Meds are working but does not like having to take all of this just to have a bowel movement.   -Recommend switching from ferrous sulfate to ferrous gluconate, as this tends to cause less stomach issues.  Allergies (Goal: Prevent respiratory issues and sinus congestion/runny nose) -Not ideally controlled -Current treatment  Albuterol HFA 2 puffs q 4 hr prn Appropriate, Effective, Safe, Query Accessible -Medications previously tried: None  -Unable to use inhaler due to difficulties coordinating hands and mouth. Provided spacer in office and reviewed use.  -Instructed to notify office if breathing or winded episodes worsen or are not relieved with appropriate inhaler use  OTC  -Current treatment  Vitamin C 500mg  1 qd Appropriate, Effective, Safe, Accessible Aspirin 81mg  1 qd Appropriate, Effective, Safe, Accessible Ferrous Sulfate 325mg  1 qd Appropriate, Effective, Query Safe Causing constipation issues that have already resulted in an ER trip. Switch to Ferrous gluconate 324mg  once daily and order updated iron panel with PCP approval to assess continued need for iron MVI 1 qd Appropriate, Effective, Safe, Accessible   Sherrill Raring Clinical Pharmacist [phone]

## 2022-07-18 ENCOUNTER — Ambulatory Visit: Payer: Medicare Other

## 2022-07-18 ENCOUNTER — Telehealth: Payer: Self-pay | Admitting: Family Medicine

## 2022-07-18 DIAGNOSIS — F411 Generalized anxiety disorder: Secondary | ICD-10-CM

## 2022-07-18 DIAGNOSIS — G301 Alzheimer's disease with late onset: Secondary | ICD-10-CM

## 2022-07-18 NOTE — Telephone Encounter (Signed)
We did a social work referral

## 2022-07-19 ENCOUNTER — Telehealth: Payer: Self-pay | Admitting: Family Medicine

## 2022-07-19 ENCOUNTER — Telehealth: Payer: Self-pay | Admitting: *Deleted

## 2022-07-19 DIAGNOSIS — D509 Iron deficiency anemia, unspecified: Secondary | ICD-10-CM

## 2022-07-19 NOTE — Progress Notes (Cosign Needed)
Call to [facility] deactivate all active prescriptions  and to [facility] to transfer all active prescriptions for patient as he has consented to pharmacy use with [facility]. All active medications are PCP prescribed and requested by Johny Drilling D.    Pamala Duffel CMA Clinical Pharmacist Assistant [phone]

## 2022-07-19 NOTE — Telephone Encounter (Signed)
Done

## 2022-07-19 NOTE — Progress Notes (Signed)
  Care Coordination   Note   07/19/2022 Name: Eric Richmond MRN: [id number] DOB: 1940-06-07  [name] is a 82 y.o. year old male who sees Nelwyn Salisbury, MD for primary care. I reached out to [employer] by phone today to offer care coordination services.  Mr. Heglund was given information about Care Coordination services today including:   The Care Coordination services include support from the care team which includes your Nurse Coordinator, Clinical Social Worker, or Pharmacist.  The Care Coordination team is here to help remove barriers to the health concerns and goals most important to you. Care Coordination services are voluntary, and the patient may decline or stop services at any time by request to their care team member.   Care Coordination Consent Status: Patient agreed to services and verbal consent obtained.   Follow up plan:  Telephone appointment with care coordination team member scheduled for:  07/20/2022   Encounter Outcome:  Pt. Scheduled from referral   Burman Nieves, [facility] Care Coordination Care Guide Direct Dial: [phone]

## 2022-07-20 ENCOUNTER — Encounter: Payer: Self-pay | Admitting: Licensed Clinical Social Worker

## 2022-07-20 ENCOUNTER — Other Ambulatory Visit: Payer: Self-pay

## 2022-07-20 MED ORDER — ALBUTEROL SULFATE HFA 108 (90 BASE) MCG/ACT IN AERS: 2.0000 | INHALATION_SPRAY | RESPIRATORY_TRACT | 5 refills | Status: DC | PRN

## 2022-07-20 MED ORDER — LOSARTAN POTASSIUM-HCTZ 50-12.5 MG PO TABS: 1.0000 | ORAL_TABLET | Freq: Every evening | ORAL | 3 refills | Status: DC

## 2022-07-20 MED ORDER — ESCITALOPRAM OXALATE 20 MG PO TABS: 20.0000 mg | ORAL_TABLET | Freq: Every day | ORAL | 3 refills | Status: DC

## 2022-07-20 MED ORDER — PANTOPRAZOLE SODIUM 40 MG PO TBEC: 40.0000 mg | DELAYED_RELEASE_TABLET | Freq: Every day | ORAL | 3 refills | Status: DC

## 2022-07-20 MED ORDER — FAMOTIDINE 40 MG PO TABS: 40.0000 mg | ORAL_TABLET | Freq: Every day | ORAL | 5 refills | Status: DC

## 2022-07-20 MED ORDER — RISPERIDONE 0.5 MG PO TABS: 0.5000 mg | ORAL_TABLET | Freq: Every day | ORAL | 3 refills | Status: DC

## 2022-07-20 MED ORDER — MEMANTINE HCL 10 MG PO TABS: 10.0000 mg | ORAL_TABLET | Freq: Two times a day (BID) | ORAL | 3 refills | Status: DC

## 2022-07-20 MED ORDER — AMLODIPINE BESYLATE 10 MG PO TABS: 5.0000 mg | ORAL_TABLET | Freq: Every day | ORAL | 3 refills | Status: DC

## 2022-07-25 ENCOUNTER — Ambulatory Visit: Payer: Self-pay | Admitting: Licensed Clinical Social Worker

## 2022-07-26 NOTE — Patient Instructions (Signed)
Visit Information  Thank you for taking time to visit with me today. Please don't hesitate to contact me if I can be of assistance to you.   Following are the goals we discussed today:   Goals Addressed             This Visit's Progress    Obtain Supportive Resources-In Home Assistance   On track    Activities and task to complete in order to accomplish goals.   Keep all upcoming appointments discussed today Continue with compliance of taking medication prescribed by Doctor Implement healthy coping skills discussed to assist with management of symptoms Continue working with [facility] care team to assist with goals identified         Our next appointment is by telephone on 06/28 at 1 PM  Please call the care guide team at [phone] if you need to cancel or reschedule your appointment.   If you are experiencing a Mental Health or Behavioral Health Crisis or need someone to talk to, please call the Suicide and Crisis Lifeline: 988 call 911   The patient verbalized understanding of instructions, educational materials, and care plan provided today and DECLINED offer to receive copy of patient instructions, educational materials, and care plan.   Jenel Lucks, MSW, LCSW [facility] Care Management [place]  [facility] [email or web address] Phone [phone] 8:50 AM

## 2022-07-26 NOTE — Patient Outreach (Signed)
  Care Coordination   Initial Visit Note   07/25/2022 Name: Eric Richmond MRN: [id number] DOB: Jul 25, 1940  Eric Richmond is a 82 y.o. year old male who sees Nelwyn Salisbury, MD for primary care. I spoke with  Eric Richmond's spouse by phone today.  What matters to the patients health and wellness today?  Aid services    Goals Addressed             This Visit's Progress    Obtain Supportive Resources-In Home Assistance   On track    Activities and task to complete in order to accomplish goals.   Keep all upcoming appointments discussed today Continue with compliance of taking medication prescribed by Doctor Implement healthy coping skills discussed to assist with management of symptoms Continue working with [facility] care team to assist with goals identified         SDOH assessments and interventions completed:  No     Care Coordination Interventions:  Yes, provided  Interventions Today    Flowsheet Row Most Recent Value  Chronic Disease   Chronic disease during today's visit Hypertension (HTN), Diabetes, Other  [Late onset Alzheimer's dementia]  General Interventions   General Interventions Discussed/Reviewed General Interventions Discussed, Community Resources, Doctor Visits, Level of Care  Doctor Visits Discussed/Reviewed Doctor Visits Discussed  Level of Care --  [Pt's spouse is interested in services to assist while she recovers from upcoming surgery]  Mental Health Interventions   Mental Health Discussed/Reviewed Mental Health Discussed, Coping Strategies  [Pt receives strong support from family]  Pharmacy Interventions   Pharmacy Dicussed/Reviewed Pharmacy Topics Discussed  Safety Interventions   Safety Discussed/Reviewed Safety Discussed       Follow up plan: Follow up call scheduled for 4-6 weeks    Encounter Outcome:  Pt. Visit Completed   Jenel Lucks, MSW, LCSW [facility] Care Management [facility]  [facility] [email or web address] Phone [phone] 8:50 AM

## 2022-08-24 ENCOUNTER — Ambulatory Visit: Payer: Self-pay | Admitting: Licensed Clinical Social Worker

## 2022-08-24 NOTE — Patient Outreach (Signed)
  Care Coordination   Follow Up Visit Note   08/24/2022 Name: Eric Richmond MRN: [id number] DOB: [birth year]  Eric Richmond is a 82 y.o. year old male who sees Nelwyn Salisbury, MD for primary care. I spoke with  Eric Richmond's wife by phone today.  What matters to the patients health and wellness today?  Aid services    Goals Addressed             This Visit's Progress    Obtain Supportive Resources-In Home Assistance   On track    Activities and task to complete in order to accomplish goals.   Keep all upcoming appointments discussed today Continue with compliance of taking medication prescribed by Doctor Implement healthy coping skills discussed to assist with management of symptoms Continue working with [facility] care team to assist with goals identified         SDOH assessments and interventions completed:  No     Care Coordination Interventions:  Yes, provided  Interventions Today    Flowsheet Row Most Recent Value  Chronic Disease   Chronic disease during today's visit Hypertension (HTN), Other, Diabetes  [GAD, Alzheimer's Dementia]  General Interventions   General Interventions Discussed/Reviewed General Interventions Reviewed, Doctor Visits  [Pt's spouse would like to post-pone supportive resources. Has agreed to contact LCSW, should needs arise sooner]  Doctor Visits Discussed/Reviewed Doctor Visits Reviewed  Mental Health Interventions   Mental Health Discussed/Reviewed Mental Health Reviewed       Follow up plan: Follow up call scheduled for 4-6 weeks    Encounter Outcome:  Pt. Visit Completed   Jenel Lucks, MSW, LCSW [facility] Care Management [facility] Health  [facility] [email or web address] Phone [phone] 1:48 PM

## 2022-08-24 NOTE — Patient Instructions (Signed)
Visit Information  Thank you for taking time to visit with me today. Please don't hesitate to contact me if I can be of assistance to you.   Following are the goals we discussed today:   Goals Addressed             This Visit's Progress    Obtain Supportive Resources-In Home Assistance   On track    Activities and task to complete in order to accomplish goals.   Keep all upcoming appointments discussed today Continue with compliance of taking medication prescribed by Doctor Implement healthy coping skills discussed to assist with management of symptoms Continue working with [facility] care team to assist with goals identified         Our next appointment is by telephone on 08/09 at 1 PM  Please call the care guide team at [phone] if you need to cancel or reschedule your appointment.   If you are experiencing a Mental Health or Behavioral Health Crisis or need someone to talk to, please call the Suicide and Crisis Lifeline: 988 call 911   The patient verbalized understanding of instructions, educational materials, and care plan provided today and DECLINED offer to receive copy of patient instructions, educational materials, and care plan.   Jenel Lucks, MSW, LCSW [facility] Care Management   [facility] [email or web address] Phone [phone] 1:51 PM

## 2022-10-05 ENCOUNTER — Ambulatory Visit: Payer: Self-pay | Admitting: Licensed Clinical Social Worker

## 2022-10-09 NOTE — Patient Instructions (Signed)
Visit Information  Thank you for taking time to visit with me today. Please don't hesitate to contact me if I can be of assistance to you.   Following are the goals we discussed today:   Goals Addressed             This Visit's Progress    Obtain Supportive Resources-In Home Assistance   On track    Activities and task to complete in order to accomplish goals.   Keep all upcoming appointments discussed today Continue with compliance of taking medication prescribed by Doctor Implement healthy coping skills discussed to assist with management of symptoms Continue working with [facility] care team to assist with goals identified        Please call the care guide team at [phone] if you need to cancel or reschedule your appointment.   If you are experiencing a Mental Health or Behavioral Health Crisis or need someone to talk to, please call the Suicide and Crisis Lifeline: 988 call 911   No further follow up required:    Jenel Lucks, MSW, LCSW [facility] Care Management [facility]  [email or web address] Phone [phone] 10:52 AM

## 2022-10-09 NOTE — Patient Outreach (Signed)
  Care Coordination   Follow Up Visit Note   10/05/2022 Name: Eric Richmond MRN: [id number] DOB: [birth year]  Eric Richmond is a 82 y.o. year old male who sees Nelwyn Salisbury, MD for primary care. I spoke with  Eric Lennert Sliney by phone today.  What matters to the patients health and wellness today?  Caregiver Strain    Goals Addressed             This Visit's Progress    Obtain Supportive Resources-In Home Assistance   On track    Activities and task to complete in order to accomplish goals.   Keep all upcoming appointments discussed today Continue with compliance of taking medication prescribed by Doctor Implement healthy coping skills discussed to assist with management of symptoms Continue working with [facility] care team to assist with goals identified         SDOH assessments and interventions completed:  No     Care Coordination Interventions:  Yes, provided  Interventions Today    Flowsheet Row Most Recent Value  Chronic Disease   Chronic disease during today's visit Hypertension (HTN), Diabetes, Other  [Late onset Alzheimer's dementia and GAD]  General Interventions   General Interventions Discussed/Reviewed General Interventions Reviewed, Doctor Visits  [Patient's spouse shared that she had surgery and the support of their adult daughter for two weeks to assist in the care of patient.]  Doctor Visits Discussed/Reviewed Doctor Visits Reviewed  Mental Health Interventions   Mental Health Discussed/Reviewed Mental Health Reviewed, Coping Strategies  Nutrition Interventions   Nutrition Discussed/Reviewed Nutrition Reviewed  Pharmacy Interventions   Pharmacy Dicussed/Reviewed Pharmacy Topics Reviewed, Medication Adherence  Safety Interventions   Safety Discussed/Reviewed Safety Reviewed       Follow up plan: No further intervention required.   Encounter Outcome:  Pt. Visit Completed   Jenel Lucks, MSW, LCSW [facility] Care Management [facility]  [facility] [email or web address] Phone [phone] 10:51 AM

## 2022-10-12 ENCOUNTER — Other Ambulatory Visit: Payer: Self-pay | Admitting: Family Medicine

## 2022-10-17 ENCOUNTER — Telehealth: Payer: Self-pay | Admitting: Family Medicine

## 2022-10-17 NOTE — Telephone Encounter (Signed)
Requesting refills of  escitalopram (LEXAPRO) 20 MG tablet memantine (NAMENDA) 10 MG tablet  pantoprazole (PROTONIX) 40 MG tablet  amLODipine (NORVASC) 10 MG tablet  [facility] [address] [place], [place] - [address] Phone: [phone]  Fax: [phone]

## 2022-10-18 ENCOUNTER — Other Ambulatory Visit: Payer: Self-pay

## 2022-10-18 MED ORDER — AMLODIPINE BESYLATE 10 MG PO TABS: 5.0000 mg | ORAL_TABLET | Freq: Every day | ORAL | 0 refills | Status: DC

## 2022-10-18 MED ORDER — MEMANTINE HCL 10 MG PO TABS: 10.0000 mg | ORAL_TABLET | Freq: Two times a day (BID) | ORAL | 0 refills | Status: DC

## 2022-10-18 MED ORDER — ESCITALOPRAM OXALATE 20 MG PO TABS: 20.0000 mg | ORAL_TABLET | Freq: Every day | ORAL | 0 refills | Status: DC

## 2022-10-18 MED ORDER — PANTOPRAZOLE SODIUM 40 MG PO TBEC: 40.0000 mg | DELAYED_RELEASE_TABLET | Freq: Every day | ORAL | 0 refills | Status: DC

## 2022-10-18 NOTE — Telephone Encounter (Signed)
Pt wife called the office requests to send pt prescriptions to [facility], stated that [facility] is going out of business and that they are having trouble delivering medications to pt. Refills for 90 days sent

## 2022-10-20 ENCOUNTER — Other Ambulatory Visit: Payer: Self-pay | Admitting: Family Medicine

## 2022-10-25 ENCOUNTER — Ambulatory Visit: Payer: Medicare Other | Admitting: Physician Assistant

## 2022-10-25 ENCOUNTER — Encounter: Payer: Self-pay | Admitting: Physician Assistant

## 2022-10-25 VITALS — BP 116/69 | HR 56 | Ht 68.0 in | Wt 173.8 lb

## 2022-10-25 DIAGNOSIS — F02818 Dementia in other diseases classified elsewhere, unspecified severity, with other behavioral disturbance: Secondary | ICD-10-CM | POA: Diagnosis not present

## 2022-10-25 DIAGNOSIS — G301 Alzheimer's disease with late onset: Secondary | ICD-10-CM | POA: Diagnosis not present

## 2022-10-25 NOTE — Patient Instructions (Addendum)
It was a pleasure to see you today at our office.   Recommendations:  Follow up in  6 months Continue Memantine 10 mg twice daily. Side effects were discussed  For guidance regarding [facility] and if placement were needed at the facility, contact Sidney Ace, Social Worker tel: [phone] Need to monitor blood pressure which is in the lower side.  Increase water intake Take care o the iron deficiency anemia Consider reducing the dose of Valium   For psychiatric meds, mood meds: Please have your primary care physician manage these medications.   Whom to call:  Memory  decline, memory medications: Call our office [phone]      For assessment of decision of mental capacity and competency:  Call Dr. Erick Blinks, geriatric psychiatrist at [phone]      If you have any severe symptoms of a stroke, or other severe issues such as confusion,severe chills or fever, etc call 911 or go to the ER as you may need to be evaluated further   Feel free to visit Facebook page " [email or web address]" for tips of how to care for people with memory problems.      RECOMMENDATIONS FOR ALL PATIENTS WITH MEMORY PROBLEMS: 1. Continue to exercise (Recommend 30 minutes of walking everyday, or 3 hours every week) 2. Increase social interactions - continue going to [place] and enjoy social gatherings with friends and family 3. Eat healthy, avoid fried foods and eat more fruits and vegetables 4. Maintain adequate blood pressure, blood sugar, and blood cholesterol level. Reducing the risk of stroke and cardiovascular disease also helps promoting better memory. 5. Avoid stressful situations. Live a simple life and avoid aggravations. Organize your time and prepare for the next day in anticipation. 6. Sleep well, avoid any interruptions of sleep and avoid any distractions in the bedroom that may interfere with adequate sleep quality 7. Avoid sugar, avoid sweets as there is a strong link  between excessive sugar intake, diabetes, and cognitive impairment We discussed the Mediterranean diet, which has been shown to help patients reduce the risk of progressive memory disorders and reduces cardiovascular risk. This includes eating fish, eat fruits and green leafy vegetables, nuts like almonds and hazelnuts, walnuts, and also use olive oil. Avoid fast foods and fried foods as much as possible. Avoid sweets and sugar as sugar use has been linked to worsening of memory function.  There is always a concern of gradual progression of memory problems. If this is the case, then we may need to adjust level of care according to patient needs. Support, both to the patient and caregiver, should then be put into place.    The Alzheimer's Association is here all day, every day for people facing Alzheimer's disease through our free 24/7 Helpline: 807-775-8234. The Helpline provides reliable information and support to all those who need assistance, such as individuals living with memory loss, Alzheimer's or other dementia, caregivers, health care professionals and the public.  Our highly trained and knowledgeable staff can help you with: Understanding memory loss, dementia and Alzheimer's  Medications and other treatment options  General information about aging and brain health  Skills to provide quality care and to find the best care from professionals  Legal, financial and living-arrangement decisions Our Helpline also features: Confidential care consultation provided by master's level clinicians who can help with decision-making support, crisis assistance and education on issues families face every day  Help in a caller's preferred language using our translation service that features  more than 200 languages and dialects  Referrals to local community programs, services and ongoing support     FALL PRECAUTIONS: Be cautious when walking. Scan the area for obstacles that may increase the risk of  trips and falls. When getting up in the mornings, sit up at the edge of the bed for a few minutes before getting out of bed. Consider elevating the bed at the head end to avoid drop of blood pressure when getting up. Walk always in a well-lit room (use night lights in the walls). Avoid area rugs or power cords from appliances in the middle of the walkways. Use a walker or a cane if necessary and consider physical therapy for balance exercise. Get your eyesight checked regularly.  FINANCIAL OVERSIGHT: Supervision, especially oversight when making financial decisions or transactions is also recommended.  HOME SAFETY: Consider the safety of the kitchen when operating appliances like stoves, microwave oven, and blender. Consider having supervision and share cooking responsibilities until no longer able to participate in those. Accidents with firearms and other hazards in the house should be identified and addressed as well.   ABILITY TO BE LEFT ALONE: If patient is unable to contact 911 operator, consider using LifeLine, or when the need is there, arrange for someone to stay with patients. Smoking is a fire hazard, consider supervision or cessation. Risk of wandering should be assessed by caregiver and if detected at any point, supervision and safe proof recommendations should be instituted.  MEDICATION SUPERVISION: Inability to self-administer medication needs to be constantly addressed. Implement a mechanism to ensure safe administration of the medications.   DRIVING: Regarding driving, in patients with progressive memory problems, driving will be impaired. We advise to have someone else do the driving if trouble finding directions or if minor accidents are reported. Independent driving assessment is available to determine safety of driving.   If you are interested in the driving assessment, you can contact the following:  [employer] in [place] [phone]  Driver Rehabilitative  Services [phone]  [facility] [phone] or [phone]      Mediterranean Diet A Mediterranean diet refers to food and lifestyle choices that are based on the traditions of countries located on the [employer]. This way of eating has been shown to help prevent certain conditions and improve outcomes for people who have chronic diseases, like kidney disease and heart disease. What are tips for following this plan? Lifestyle  Cook and eat meals together with your family, when possible. Drink enough fluid to keep your urine clear or pale yellow. Be physically active every day. This includes: Aerobic exercise like running or swimming. Leisure activities like gardening, walking, or housework. Get 7-8 hours of sleep each night. If recommended by your health care provider, drink red wine in moderation. This means 1 glass a day for nonpregnant women and 2 glasses a day for men. A glass of wine equals 5 oz (150 mL). Reading food labels  Check the serving size of packaged foods. For foods such as rice and pasta, the serving size refers to the amount of cooked product, not dry. Check the total fat in packaged foods. Avoid foods that have saturated fat or trans fats. Check the ingredients list for added sugars, such as corn syrup. Shopping  At the grocery store, buy most of your food from the areas near the walls of the store. This includes: Fresh fruits and vegetables (produce). Grains, beans, nuts, and seeds. Some of  these may be available in unpackaged forms or large amounts (in bulk). Fresh seafood. Poultry and eggs. Low-fat dairy products. Buy whole ingredients instead of prepackaged foods. Buy fresh fruits and vegetables in-season from local farmers markets. Buy frozen fruits and vegetables in resealable bags. If you do not have access to quality fresh seafood, buy precooked frozen shrimp or canned fish, such as tuna, salmon, or  sardines. Buy small amounts of raw or cooked vegetables, salads, or olives from the deli or salad bar at your store. Stock your pantry so you always have certain foods on hand, such as olive oil, canned tuna, canned tomatoes, rice, pasta, and beans. Cooking  Cook foods with extra-virgin olive oil instead of using butter or other vegetable oils. Have meat as a side dish, and have vegetables or grains as your main dish. This means having meat in small portions or adding small amounts of meat to foods like pasta or stew. Use beans or vegetables instead of meat in common dishes like chili or lasagna. Experiment with different cooking methods. Try roasting or broiling vegetables instead of steaming or sauteing them. Add frozen vegetables to soups, stews, pasta, or rice. Add nuts or seeds for added healthy fat at each meal. You can add these to yogurt, salads, or vegetable dishes. Marinate fish or vegetables using olive oil, lemon juice, garlic, and fresh herbs. Meal planning  Plan to eat 1 vegetarian meal one day each week. Try to work up to 2 vegetarian meals, if possible. Eat seafood 2 or more times a week. Have healthy snacks readily available, such as: Vegetable sticks with hummus. Greek yogurt. Fruit and nut trail mix. Eat balanced meals throughout the week. This includes: Fruit: 2-3 servings a day Vegetables: 4-5 servings a day Low-fat dairy: 2 servings a day Fish, poultry, or lean meat: 1 serving a day Beans and legumes: 2 or more servings a week Nuts and seeds: 1-2 servings a day Whole grains: 6-8 servings a day Extra-virgin olive oil: 3-4 servings a day Limit red meat and sweets to only a few servings a month What are my food choices? Mediterranean diet Recommended Grains: Whole-grain pasta. Brown rice. Bulgar wheat. Polenta. Couscous. Whole-wheat bread. Orpah Cobb. Vegetables: Artichokes. Beets. Broccoli. Cabbage. Carrots. Eggplant. Green beans. Chard. Kale. Spinach.  Onions. Leeks. Peas. Squash. Tomatoes. Peppers. Radishes. Fruits: Apples. Apricots. Avocado. Berries. Bananas. Cherries. Dates. Figs. Grapes. Lemons. Melon. Oranges. Peaches. Plums. Pomegranate. Meats and other protein foods: Beans. Almonds. Sunflower seeds. Pine nuts. Peanuts. Cod. Salmon. Scallops. Shrimp. Tuna. Tilapia. Clams. Oysters. Eggs. Dairy: Low-fat milk. Cheese. Greek yogurt. Beverages: Water. Red wine. Herbal tea. Fats and oils: Extra virgin olive oil. Avocado oil. Grape seed oil. Sweets and desserts: [place] yogurt with honey. Baked apples. Poached pears. Trail mix. Seasoning and other foods: Basil. Cilantro. Coriander. Cumin. Mint. Parsley. Sage. Rosemary. Tarragon. Garlic. Oregano. Thyme. Pepper. Balsalmic vinegar. Tahini. Hummus. Tomato sauce. Olives. Mushrooms. Limit these Grains: Prepackaged pasta or rice dishes. Prepackaged cereal with added sugar. Vegetables: Deep fried potatoes (french fries). Fruits: Fruit canned in syrup. Meats and other protein foods: Beef. Pork. Lamb. Poultry with skin. Hot dogs. Tomasa Blase. Dairy: Ice cream. Sour cream. Whole milk. Beverages: Juice. Sugar-sweetened soft drinks. Beer. Liquor and spirits. Fats and oils: Butter. Canola oil. Vegetable oil. Beef fat (tallow). Lard. Sweets and desserts: Cookies. Cakes. Pies. Candy. Seasoning and other foods: Mayonnaise. Premade sauces and marinades. The items listed may not be a complete list. Talk with your dietitian about what dietary choices are right for  you. Summary The Mediterranean diet includes both food and lifestyle choices. Eat a variety of fresh fruits and vegetables, beans, nuts, seeds, and whole grains. Limit the amount of red meat and sweets that you eat. Talk with your health care provider about whether it is safe for you to drink red wine in moderation. This means 1 glass a day for nonpregnant women and 2 glasses a day for men. A glass of wine equals 5 oz (150 mL). This information is not  intended to replace advice given to you by your health care provider. Make sure you discuss any questions you have with your health care provider. Document Released: 10/06/2015 Document Revised: 11/08/2015 Document Reviewed: 10/06/2015 Elsevier Interactive Patient Education  2017 [employer].

## 2022-10-25 NOTE — Progress Notes (Signed)
Assessment/Plan:   Dementia likely due to Alzheimer's disease, late onset, with behavioral disturbance  Eric Richmond is a very pleasant 82 y.o. RH male with a history of sleep apnea noncompliant with CPAP, HOH, and a diagnosis of dementia likely due to Alzheimer's disease with behavioral disturbance seen today in follow up for memory loss. Patient is currently on memantine 10 mg twice daily.  MMSE today is 15/30, however, his delayed recall is worse from prior visit.  Discussed with his wife the role of memantine, which may not be therapeutic in the near future.  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  Discussed with his wife to monitor closely his blood pressure, may need adjustment in medication, and to decrease the use of Valium in the interim.  Follow up in 6 months. Continue memantine 10 mg twice daily Discussed with PCP decreasing Valium dose BP is on the softer side today, discussed with PCP adjusting the dose. Recommend good control of her cardiovascular risk factors Continue to control mood as per PCP Increase socialization and activities    Subjective:    This patient is accompanied in the office by his wife who supplements the history.  Previous records as well as any outside records available were reviewed prior to todays visit. Patient was last seen on 04/20/2022   Any changes in memory since last visit? " A little bit worse especially in the night.  He does report difficulty remembering conversations, new information or names.  He does not play pool as much as before.  He does not like doing any brain games.Has not been very active because his wife had recent hernia surgery so he cannot be driven there.  repeats oneself?  Endorsed, more than before.  During this visit, he mentions several times that  he wants to go back to playing pool. Disoriented when walking into a room? Endorsed  Patient sometimes may get confused about where he lives.  Leaving objects in unusual places?  May misplace things and then his wife has "to search all over"   Wandering behavior?  Denies.   Any personality changes since last visit? Denies.   Any worsening depression?:  Denies.   Hallucinations or paranoia?  Endorsed.  "He talks to people all day long, a little bit more than before worse at night ".  However, this is not frightening to him. Seizures? denies    Any sleep changes?  Sleeps well, has vivid dreams, as well as REM behavior, "talks a lot at night", denies sleepwalking.   Sleep apnea?  Endorsed, noncompliant to CPAP "does not work for him anymore".   Any hygiene concerns? Denies.  Independent of bathing and dressing?  Endorsed  Does the patient needs help with medications?  Wife is in charge   Who is in charge of the finances?  Wife is in charge     Any changes in appetite?  As before, he forgets to eat, so his wife has to prepare the meals for him.  He also does not drink enough water.   Patient have trouble swallowing? Denies.   Does the patient cook? No Any headaches?   Denies.   Chronic back pain  denies.   Ambulates with difficulty? Denies.  [facility]    Recent falls or head injuries?  Denies.     Unilateral weakness, numbness or tingling? Denies.   Any tremors?  Denies   Any anosmia?  Denies   Any incontinence of urine?  Endorsed, wears diapers at night Any bowel dysfunction?  He has chronic constipation.      Patient lives with his wife, looking into getting HHN Does the patient drive? No longer drives      History on Initial Assessment 04/25/2018: This is a very pleasant 82 year old right-handed man with a history of hypertension, sleep apnea on CPAP, presenting for evaluation of worsening memory. He states his memory is not as good as it had been. His wife started  noticing memory changes around 6 months ago, noting that he is very forgetful. He forgets where he puts things or does not remember the meal he ate prior. She feels his forgetfulness is just more than usual. He occasionally repeats himself. They both agree that he is good with managing his own medications, he rarely forgets them. 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 He denies any difficulties with driving and work. He got lost briefly a week ago in an unfamiliar road, he got "a little excited" but was able to find his way back within 5 minutes. His wife denies any other driving concerns. Wife manages finances. He is independent with dressing and bathing. His wife has noticed he is a little more stressed out and "gets really tight" when he cannot find something. No paranoia or hallucinations. Sleep is good with CPAP, no wandering behavior.   He has frontal headaches when using a machine with smoke coming out. He has occasional dizziness when standing. He has occasional back pain from a pinched nerve. He has had a decreased sense of smell for some time. No diplopia, dysarthria/dysphagia, neck pain, focal numbness/tingling/weakness, bowel/bladder dysfunction, tremors. His father had memory issues. He occasionally drinks a little wine. No significant head injuries. Denies headaches, double vision, dizziness, focal numbness or tingling, unilateral weakness, tremors or anosmia. No history of seizures. Denies urine incontinence, retention, constipation or diarrhea.  PREVIOUS MEDICATIONS:   CURRENT MEDICATIONS:  Outpatient Encounter Medications as of 10/25/2022  Medication Sig   albuterol (PROAIR HFA) 108 (90 Base) MCG/ACT inhaler Inhale 2 puffs into the lungs every 4 (four) hours as needed for wheezing or shortness of breath.   amLODipine (NORVASC) 10 MG tablet Take 0.5 tablets (5 mg total) by mouth daily.   aspirin EC 81 MG tablet Take 81 mg by mouth  daily.   diazepam (VALIUM) 2 MG tablet TAKE 1 TABLET BY MOUTH EVERY 8 HOURS AS NEEDED FOR DIZZINESS   docusate sodium (COLACE) 100 MG capsule Take 1 capsule (100 mg total) by mouth 2 (two) times daily.   escitalopram (LEXAPRO) 20 MG tablet Take 1 tablet (20 mg total) by mouth daily.   famotidine (PEPCID) 40 MG tablet Take 1 tablet (40 mg total) by mouth daily before supper.   ferrous gluconate (FERGON) 324 MG tablet Take 324 mg by mouth daily.   fluticasone (FLONASE) 50 MCG/ACT nasal spray Place 2 sprays into both nostrils daily.   losartan-hydrochlorothiazide (HYZAAR) 50-12.5 MG tablet TAKE 1 TABLET BY MOUTH ONCE DAILY IN THE EVENING   magnesium hydroxide (MILK OF MAGNESIA) 400 MG/5ML suspension Take by mouth 3 (three) times daily as needed for mild constipation.   memantine (NAMENDA) 10 MG tablet Take 1 tablet (10 mg total) by mouth 2 (two) times daily.  Multiple Vitamin (MULTIVITAMIN WITH MINERALS) TABS tablet Take 1 tablet by mouth daily.   pantoprazole (PROTONIX) 40 MG tablet Take 1 tablet (40 mg total) by mouth daily.   polyethylene glycol (MIRALAX / GLYCOLAX) 17 g packet Take 17 g by mouth daily as needed.   risperiDONE (RISPERDAL) 0.5 MG tablet Take 1 tablet (0.5 mg total) by mouth at bedtime.   [DISCONTINUED] atenolol (TENORMIN) 50 MG tablet TAKE 1 TABLET EVERY DAY (NEED MD APPOINTMENT FOR REFILLS) (Patient taking differently: Take 50 mg by mouth daily. )   [DISCONTINUED] diazepam (VALIUM) 2 MG tablet TAKE 1 TABLET BY MOUTH EVERY 8 HOURS AS NEEDED FOR  DIZZINESS   No facility-administered encounter medications on file as of 10/25/2022.       10/25/2022   11:00 AM 04/07/2021   11:00 AM 10/05/2020    8:00 AM  MMSE - Mini Mental State Exam  Orientation to time 0 0 3  Orientation to Place 4 2 5   Registration 3 3 3   Attention/ Calculation 2 0 2  Recall 0 3 0  Language- name 2 objects 2 2 2   Language- repeat 0 0 1  Language- follow 3 step command 3 3 1   Language- read & follow  direction 1 1 1   Write a sentence 0 0 0  Copy design 0 0 0  Total score 15 14 18       12/02/2018   11:00 AM 04/25/2018    9:00 AM  Montreal Cognitive Assessment   Visuospatial/ Executive (0/5) 1 1  Naming (0/3) 2 3  Attention: Read list of digits (0/2) 1 1  Attention: Read list of letters (0/1) 1 1  Attention: Serial 7 subtraction starting at 100 (0/3) 2 1  Language: Repeat phrase (0/2) 1 1  Language : Fluency (0/1) 0 0  Abstraction (0/2) 0 0  Delayed Recall (0/5) 0 0  Orientation (0/6) 4 2  Total 12 10  Adjusted Score (based on education) 13 11    Objective:     PHYSICAL EXAMINATION:    VITALS:   Vitals:   10/25/22 1044  BP: 116/69  Pulse: (!) 56  SpO2: 99%  Weight: 173 lb 12.8 oz (78.8 kg)  Height: 5\' 8"  (1.727 m)    GEN:  The patient appears stated age and is in NAD. HEENT:  Normocephalic, atraumatic.   Neurological examination:  General: NAD, well-groomed, appears stated age. Orientation: The patient is alert. Oriented to person, place and not to date Cranial nerves: There is good facial symmetry.The speech is fluent and clear. No aphasia or dysarthria. Fund of knowledge is reduced.Recent and remote memory are impaired. Attention and concentration are reduced.  Able to name objects and repeat phrases.  Hearing is decreased to conversational tone.  Sensation: Sensation is intact to light touch throughout Motor: Strength is at least antigravity x4. DTR's1/4 in UE/LE     Movement examination: Tone: There is normal tone in the UE/LE Abnormal movements:  no tremor.  No myoclonus.  No asterixis.   Coordination:  There is no decremation with RAM's. Normal finger to nose  Gait and Station: The patient has no difficulty arising out of a deep-seated chair without the use of the hands. The patient's stride length is good.  Gait is cautious and narrow.    Thank you for allowing [place] the opportunity to participate in the care of this nice patient. Please do not hesitate  to contact us for any questions or concerns.   Total time spent on  today's visit was 30 minutes dedicated to this patient today, preparing to see patient, examining the patient, ordering tests and/or medications and counseling the patient, documenting clinical information in the EHR or other health record, independently interpreting results and communicating results to the patient/family, discussing treatment and goals, answering patient's questions and coordinating care.  Cc:  Nelwyn Salisbury, MD  Marlowe Kays 10/25/2022 11:44 AM

## 2022-11-02 ENCOUNTER — Encounter: Payer: Self-pay | Admitting: Family Medicine

## 2022-11-02 ENCOUNTER — Ambulatory Visit (INDEPENDENT_AMBULATORY_CARE_PROVIDER_SITE_OTHER): Payer: Medicare Other | Admitting: Family Medicine

## 2022-11-02 ENCOUNTER — Other Ambulatory Visit: Payer: Self-pay

## 2022-11-02 VITALS — BP 96/60 | HR 58 | Temp 98.7°F | Wt 174.0 lb

## 2022-11-02 DIAGNOSIS — I10 Essential (primary) hypertension: Secondary | ICD-10-CM | POA: Diagnosis not present

## 2022-11-02 DIAGNOSIS — Z23 Encounter for immunization: Secondary | ICD-10-CM

## 2022-11-02 DIAGNOSIS — R42 Dizziness and giddiness: Secondary | ICD-10-CM | POA: Diagnosis not present

## 2022-11-02 MED ORDER — RISPERIDONE 0.5 MG PO TABS: 0.5000 mg | ORAL_TABLET | Freq: Every day | ORAL | 1 refills | Status: DC

## 2022-11-02 NOTE — Addendum Note (Signed)
Addended by: Carola Rhine on: 11/02/2022 11:53 AM   Modules accepted: Orders

## 2022-11-02 NOTE — Progress Notes (Signed)
   Subjective:    Patient ID: Eric Richmond, male    DOB: 01/22/1941, 82 y.o.   MRN: [id number]  HPI Here with his wife to follow up on HTN. His BP has been running low for a few months, and we had decreased the Amlodipine to 5 mg every morning. He also takes Losartan-HCT in the evenings. His systolic BP is often in the range of 90's to110, and he often feels lightheaded. Fortunately his vertigo has improved quite a bit, and he rarely take Diazepam any more.    Review of Systems  Constitutional: Negative.   Respiratory: Negative.    Cardiovascular: Negative.   Neurological:  Positive for light-headedness. Negative for dizziness and headaches.       Objective:   Physical Exam Constitutional:      Appearance: Normal appearance.  Cardiovascular:     Rate and Rhythm: Normal rate and regular rhythm.     Pulses: Normal pulses.     Heart sounds: Normal heart sounds.  Pulmonary:     Effort: Pulmonary effort is normal.     Breath sounds: Normal breath sounds.  Neurological:     Mental Status: He is alert. Mental status is at baseline.           Assessment & Plan:  His HTN is still overtreated, so we will stop the Amlodipine completely. He will stay on the Losartan HCT50-12.5 as it is. Recheck at his well exam next month. Gershon Crane, MD

## 2022-12-14 ENCOUNTER — Ambulatory Visit (INDEPENDENT_AMBULATORY_CARE_PROVIDER_SITE_OTHER): Payer: Medicare Other

## 2022-12-14 VITALS — BP 120/60 | Temp 97.7°F | Ht 68.0 in | Wt 172.8 lb

## 2022-12-14 DIAGNOSIS — Z0000 Encounter for general adult medical examination without abnormal findings: Secondary | ICD-10-CM

## 2022-12-14 NOTE — Progress Notes (Signed)
Subjective:   Eric Richmond is a 82 y.o. male who presents for Medicare Annual/Subsequent preventive examination.  Visit Complete: In person    Cardiac Risk Factors include: advanced age (>49men, >22 women);diabetes mellitus;hypertension;male gender     Objective:    Today's Vitals   12/14/22 1313  BP: 120/60  Temp: 97.7 F (36.5 C)  TempSrc: Oral  Weight: 172 lb 12.8 oz (78.4 kg)  Height: 5\' 8"  (1.727 m)   Body mass index is 26.27 kg/m.     12/14/2022    1:38 PM 10/25/2022   10:49 AM 04/20/2022    9:12 AM 04/01/2022   10:13 AM 12/07/2021   11:42 AM 10/18/2021    9:31 AM 10/09/2021   12:37 PM  Advanced Directives  Does Patient Have a Medical Advance Directive? No No No No No No No  Would patient like information on creating a medical advance directive? No - Patient declined    No - Patient declined      Current Medications (verified) Outpatient Encounter Medications as of 12/14/2022  Medication Sig   albuterol (PROAIR HFA) 108 (90 Base) MCG/ACT inhaler Inhale 2 puffs into the lungs every 4 (four) hours as needed for wheezing or shortness of breath.   aspirin EC 81 MG tablet Take 81 mg by mouth daily.   diazepam (VALIUM) 2 MG tablet TAKE 1 TABLET BY MOUTH EVERY 8 HOURS AS NEEDED FOR DIZZINESS   docusate sodium (COLACE) 100 MG capsule Take 1 capsule (100 mg total) by mouth 2 (two) times daily.   escitalopram (LEXAPRO) 20 MG tablet Take 1 tablet (20 mg total) by mouth daily.   famotidine (PEPCID) 40 MG tablet Take 1 tablet (40 mg total) by mouth daily before supper.   ferrous gluconate (FERGON) 324 MG tablet Take 324 mg by mouth daily.   fluticasone (FLONASE) 50 MCG/ACT nasal spray Place 2 sprays into both nostrils daily.   losartan-hydrochlorothiazide (HYZAAR) 50-12.5 MG tablet TAKE 1 TABLET BY MOUTH ONCE DAILY IN THE EVENING   magnesium hydroxide (MILK OF MAGNESIA) 400 MG/5ML suspension Take by mouth 3 (three) times daily as needed for mild constipation.   memantine  (NAMENDA) 10 MG tablet Take 1 tablet (10 mg total) by mouth 2 (two) times daily.   Multiple Vitamin (MULTIVITAMIN WITH MINERALS) TABS tablet Take 1 tablet by mouth daily.   pantoprazole (PROTONIX) 40 MG tablet Take 1 tablet (40 mg total) by mouth daily.   polyethylene glycol (MIRALAX / GLYCOLAX) 17 g packet Take 17 g by mouth daily as needed.   risperiDONE (RISPERDAL) 0.5 MG tablet Take 1 tablet (0.5 mg total) by mouth at bedtime.   [DISCONTINUED] atenolol (TENORMIN) 50 MG tablet TAKE 1 TABLET EVERY DAY (NEED MD APPOINTMENT FOR REFILLS) (Patient taking differently: Take 50 mg by mouth daily. )   No facility-administered encounter medications on file as of 12/14/2022.    Allergies (verified) Oxycodone and Tramadol   History: Past Medical History:  Diagnosis Date   Allergy    Anal fissure 20 years ago   COVID-19 01/2019   Diabetes mellitus without complication (HCC) 03/18/2013   ED (erectile dysfunction)    Essential hypertension 10/22/2006   Qualifier: Diagnosis of  By: Clent Ridges MD, Tera Mater    GERD (gastroesophageal reflux disease)    Hard of hearing    Mild neurocognitive disorder 01/06/2019   OSA (obstructive sleep apnea)    per Dr. Shelle Iron , cpap -2.0   Osteoarthritis    Right knee and right thumb;  s/p total knee replacement   Pneumonia 2015   Past Surgical History:  Procedure Laterality Date   ANAL FISSURE REPAIR     colonoscopy  04/10/2019   per Dr. Rhea Belton, adenomatous polyps removed, no repeats due to age   KNEE ARTHROSCOPY     left knee   KNEE CLOSED REDUCTION Left 01/13/2014   Procedure: CLOSED MANIPULATION LEFT KNEE;  Surgeon: Loanne Drilling, MD;  Location: [facility];  Service: Orthopedics;  Laterality: Left;   SURGERY AGE 67 OR 7 ON LEFT KNEE FOR A "GLAND BEHIND THE KNEE"     TOTAL KNEE ARTHROPLASTY Left 10/26/2013   Procedure: LEFT TOTAL KNEE ARTHROPLASTY;  Surgeon: Loanne Drilling, MD;  Location: [facility];  Service: Orthopedics;  Laterality: Left;   Family History   Problem Relation Age of Onset   Breast cancer Sister    Diabetes Sister    Hypertension Sister    Thyroid cancer Sister    Diabetes Mother    Hypertension Mother    Diabetes Father    Hypertension Father    Diabetes Sister    Hypertension Other    Diabetes Brother    Hypertension Brother    Heart failure Brother    Heart failure Brother    Diabetes Sister    Colon cancer Neg Hx    Esophageal cancer Neg Hx    Rectal cancer Neg Hx    Stomach cancer Neg Hx    Social History   Socioeconomic History   Marital status: Married    Spouse name: [name]   Number of children: 2   Years of education: 12   Highest education level: High school graduate  Occupational History   Occupation: retired  Tobacco Use   Smoking status: Former    Current packs/day: 0.00    Average packs/day: 0.3 packs/day for 20.0 years (6.0 ttl pk-yrs)    Types: Cigarettes    Start date: 02/26/1957    Quit date: 02/26/1977    Years since quitting: 45.8   Smokeless tobacco: Former    Types: Associate Professor status: Never Used  Substance and Sexual Activity   Alcohol use: No    Alcohol/week: 0.0 standard drinks of alcohol   Drug use: No   Sexual activity: Not Currently    Partners: Female  Other Topics Concern   Not on file  Social History Narrative   Epworth Sleepiness Scale = 5 (12/14/2014)      Right handed      Graduated HS      Lives with wife      Drinks caffeine   One story home   Social Determinants of Health   Financial Resource Strain: Low Risk  (12/14/2022)   Overall Financial Resource Strain (CARDIA)    Difficulty of Paying Living Expenses: Not hard at all  Food Insecurity: No Food Insecurity (12/14/2022)   Hunger Vital Sign    Worried About Running Out of Food in the Last Year: Never true    Ran Out of Food in the Last Year: Never true  Transportation Needs: No Transportation Needs (12/14/2022)   PRAPARE - Administrator, Civil Service (Medical): No     Lack of Transportation (Non-Medical): No  Physical Activity: Inactive (12/14/2022)   Exercise Vital Sign    Days of Exercise per Week: 0 days    Minutes of Exercise per Session: 0 min  Stress: No Stress Concern Present (12/14/2022)   [employer] of Occupational  Health - Occupational Stress Questionnaire    Feeling of Stress : Not at all  Social Connections: Socially Integrated (12/14/2022)   Social Connection and Isolation Panel [NHANES]    Frequency of Communication with Friends and Family: More than three times a week    Frequency of Social Gatherings with Friends and Family: More than three times a week    Attends Religious Services: More than 4 times per year    Active Member of [employer] or Organizations: Yes    Attends Engineer, structural: More than 4 times per year    Marital Status: Married    Tobacco Counseling Counseling given: Not Answered   Clinical Intake:  Pre-visit preparation completed: Yes  Pain : No/denies pain     BMI - recorded: 26.27 Nutritional Status: BMI 25 -29 Overweight Nutritional Risks: None Diabetes: Yes CBG done?: No Did pt. bring in CBG monitor from home?: No  How often do you need to have someone help you when you read instructions, pamphlets, or other written materials from your doctor or pharmacy?: 3 - Sometimes (Wife assist)  Interpreter Needed?: No  Information entered by :: Theresa Mulligan LPN   Activities of Daily Living    12/14/2022    1:34 PM  In your present state of health, do you have any difficulty performing the following activities:  Hearing? 1  Comment Wears hearing aids  Vision? 0  Difficulty concentrating or making decisions? 1  Comment Dx: Dementia  Walking or climbing stairs? 0  Dressing or bathing? 0  Doing errands, shopping? 1  Comment Wife Ship broker and eating ? Y  Comment Wife assist  Using the Toilet? N  Comment Wife assist  In the past six months, have you accidently leaked  urine? Y  Comment Wears breifs at night. Followed by PCP  Do you have problems with loss of bowel control? N  Managing your Medications? Y  Comment Wife assist  Managing your Finances? Y  Comment Wife assist  Housekeeping or managing your Housekeeping? Y  Comment Wife assist    Patient Care Team: Nelwyn Salisbury, MD as PCP - General Karel Jarvis [name], MD as Consulting Physician (Neurology) Lutricia Feil, [facility] as Pharmacist (Pharmacist) Alejandro Mulling, RN as [facility] [place], Milas Kocher, [facility] (Pharmacist) Elwyn Reach (Neurology)  Indicate any recent Medical Services you may have received from other than [facility] providers in the past year (date may be approximate).     Assessment:   This is a routine wellness examination for Immanuel.  Hearing/Vision screen Hearing Screening - Comments:: Wears hearing aids Vision Screening - Comments:: Wears rx glasses - up to date with routine eye exams with  Dr Dione Booze   Goals Addressed               This Visit's Progress     Increase physical activity (pt-stated)         Depression Screen    12/14/2022    1:18 PM 06/15/2022   10:47 AM 12/15/2021    8:30 AM 12/07/2021   11:38 AM 10/12/2021   10:50 AM 10/04/2021   12:00 PM 09/21/2021    9:29 AM  PHQ 2/9 Scores  PHQ - 2 Score 0 0 0 0 0 0 0  PHQ- 9 Score  5 5 0 6  10    Fall Risk    12/14/2022    1:37 PM 10/25/2022   10:49 AM 05/10/2022   10:18  AM 04/20/2022    9:11 AM 12/15/2021    8:30 AM  Fall Risk   Falls in the past year? 0 0 1 0 1  Number falls in past yr: 0 0 0 0 0  Injury with Fall? 0 0 1 0 0  Comment   Right foot big toe    Risk for fall due to : No Fall Risks  History of fall(s)  No Fall Risks  Follow up Falls prevention discussed Falls evaluation completed Falls evaluation completed Falls evaluation completed Falls evaluation completed    MEDICARE RISK AT HOME: Medicare Risk at Home Any stairs in or around the home?: No If  so, are there any without handrails?: No Home free of loose throw rugs in walkways, pet beds, electrical cords, etc?: Yes Adequate lighting in your home to reduce risk of falls?: Yes Life alert?: No Use of a cane, walker or w/c?: Yes Grab bars in the bathroom?: Yes Shower chair or bench in shower?: No Elevated toilet seat or a handicapped toilet?: No  TIMED UP AND GO:  Was the test performed?  Yes  Length of time to ambulate 10 feet: 10 sec Gait slow and steady without use of assistive device    Cognitive Function:    10/25/2022   11:00 AM 04/07/2021   11:00 AM 10/05/2020    8:00 AM 08/13/2017    5:04 PM 08/09/2016    4:29 PM  MMSE - Mini Mental State Exam  Orientation to time 0 0 3 4 4   Orientation to time comments     Disoriented to date.  Orientation to Place 4 2 5 5 5   Registration 3 3 3 3 3   Attention/ Calculation 2 0 2 4 4   Recall 0 3 0 2 1  Language- name 2 objects 2 2 2 2 2   Language- repeat 0 0 1 1 1   Language- follow 3 step command 3 3 1 3 3   Language- read & follow direction 1 1 1 1 1   Write a sentence 0 0 0 1 1  Copy design 0 0 0 0 0  Total score 15 14 18 26 25       12/02/2018   11:00 AM 04/25/2018    9:00 AM  Montreal Cognitive Assessment   Visuospatial/ Executive (0/5) 1 1  Naming (0/3) 2 3  Attention: Read list of digits (0/2) 1 1  Attention: Read list of letters (0/1) 1 1  Attention: Serial 7 subtraction starting at 100 (0/3) 2 1  Language: Repeat phrase (0/2) 1 1  Language : Fluency (0/1) 0 0  Abstraction (0/2) 0 0  Delayed Recall (0/5) 0 0  Orientation (0/6) 4 2  Total 12 10  Adjusted Score (based on education) 13 11      12/14/2022    1:38 PM 12/07/2021   11:42 AM  6CIT Screen  What Year? -- 4 points  What month? -- 3 points  What time? -- 0 points  Count back from 20 -- 4 points  Months in reverse -- 4 points  Repeat phrase -- 4 points  Total Score  19 points    Immunizations Immunization History  Administered Date(s) Administered    Fluad Quad(high Dose 65+) 12/15/2018   Fluad Trivalent(High Dose 65+) 11/02/2022   Influenza Split 01/10/2013, 11/18/2019   Influenza Whole 11/21/2009, 01/17/2010   Influenza, High Dose Seasonal PF 11/18/2014, 12/30/2015, 12/12/2016   Influenza,inj,Quad PF,6+ Mos 12/15/2013   Influenza-Unspecified 12/05/2020   Moderna Covid-19 Vaccine Bivalent  Booster 51yrs & up 12/14/2021   PFIZER(Purple Top)SARS-COV-2 Vaccination 12/05/2020   Pneumococcal Conjugate-13 12/15/2013   Pneumococcal Polysaccharide-23 06/02/2015   Tdap 12/12/2017, 12/16/2017    TDAP status: Up to date  Flu Vaccine status: Up to date  Pneumococcal vaccine status: Up to date  Covid-19 vaccine status: Declined, Education has been provided regarding the importance of this vaccine but patient still declined. Advised may receive this vaccine at local pharmacy or Health Dept.or vaccine clinic. Aware to provide a copy of the vaccination record if obtained from local pharmacy or Health Dept. Verbalized acceptance and understanding.  Qualifies for Shingles Vaccine? Yes   Zostavax completed No   Shingrix Completed?: No.    Education has been provided regarding the importance of this vaccine. Patient has been advised to call insurance company to determine out of pocket expense if they have not yet received this vaccine. Advised may also receive vaccine at local pharmacy or Health Dept. Verbalized acceptance and understanding.  Screening Tests Health Maintenance  Topic Date Due   FOOT EXAM  Never done   Diabetic kidney evaluation - Urine ACR  Never done   Zoster Vaccines- Shingrix (1 of 2) Never done   OPHTHALMOLOGY EXAM  02/26/2022   Colonoscopy  04/09/2022   HEMOGLOBIN A1C  06/16/2022   COVID-19 Vaccine (3 - 2023-24 season) 10/28/2022   Diabetic kidney evaluation - eGFR measurement  04/02/2023   Medicare Annual Wellness (AWV)  12/14/2023   DTaP/Tdap/Td (3 - Td or Tdap) 12/17/2027   Pneumonia Vaccine 88+ Years old  Completed    INFLUENZA VACCINE  Completed   HPV VACCINES  Aged Out    Health Maintenance  Health Maintenance Due  Topic Date Due   FOOT EXAM  Never done   Diabetic kidney evaluation - Urine ACR  Never done   Zoster Vaccines- Shingrix (1 of 2) Never done   OPHTHALMOLOGY EXAM  02/26/2022   Colonoscopy  04/09/2022   HEMOGLOBIN A1C  06/16/2022   COVID-19 Vaccine (3 - 2023-24 season) 10/28/2022    Additional Screening:    Vision Screening: Recommended annual ophthalmology exams for early detection of glaucoma and other disorders of the eye. Is the patient up to date with their annual eye exam?  Yes  Who is the provider or what is the name of the office in which the patient attends annual eye exams? Dr Dione Booze If pt is not established with a provider, would they like to be referred to a provider to establish care? No .   Dental Screening: Recommended annual dental exams for proper oral hygiene  Diabetic Foot Exam: Diabetic Foot Exam: Overdue, Pt has been advised about the importance in completing this exam. Pt is scheduled for diabetic foot exam on Deferred .  Community Resource Referral / Chronic Care Management:  CRR required this visit?  No   CCM required this visit?  No     Plan:     I have personally reviewed and noted the following in the patient's chart:   Medical and social history Use of alcohol, tobacco or illicit drugs  Current medications and supplements including opioid prescriptions. Patient is not currently taking opioid prescriptions. Functional ability and status Nutritional status Physical activity Advanced directives List of other physicians Hospitalizations, surgeries, and ER visits in previous 12 months Vitals Screenings to include cognitive, depression, and falls Referrals and appointments  In addition, I have reviewed and discussed with patient certain preventive protocols, quality metrics, and best practice recommendations. A written personalized care  plan for preventive services as well as general preventive health recommendations were provided to patient.     Tillie Rung, LPN   16/11/9602   After Visit Summary: Given  Nurse Notes: None

## 2022-12-14 NOTE — Patient Instructions (Addendum)
Eric Richmond , Thank you for taking time to come for your Medicare Wellness Visit. I appreciate your ongoing commitment to your health goals. Please review the following plan we discussed and let me know if I can assist you in the future.   Referrals/Orders/Follow-Ups/Clinician Recommendations:   This is a list of the screening recommended for you and due dates:  Health Maintenance  Topic Date Due   Complete foot exam   Never done   Yearly kidney health urinalysis for diabetes  Never done   Zoster (Shingles) Vaccine (1 of 2) Never done   Eye exam for diabetics  02/26/2022   Colon Cancer Screening  04/09/2022   Hemoglobin A1C  06/16/2022   COVID-19 Vaccine (3 - 2023-24 season) 10/28/2022   Yearly kidney function blood test for diabetes  04/02/2023   Medicare Annual Wellness Visit  12/14/2023   DTaP/Tdap/Td vaccine (3 - Td or Tdap) 12/17/2027   Pneumonia Vaccine  Completed   Flu Shot  Completed   HPV Vaccine  Aged Out    Advanced directives: (Declined) Advance directive discussed with you today. Even though you declined this today, please call our office should you change your mind, and we can give you the proper paperwork for you to fill out.  Next Medicare Annual Wellness Visit scheduled for next year: Yes

## 2022-12-19 DIAGNOSIS — R42 Dizziness and giddiness: Secondary | ICD-10-CM | POA: Diagnosis not present

## 2022-12-19 DIAGNOSIS — H6123 Impacted cerumen, bilateral: Secondary | ICD-10-CM | POA: Diagnosis not present

## 2022-12-24 ENCOUNTER — Encounter: Payer: Self-pay | Admitting: Family Medicine

## 2022-12-24 ENCOUNTER — Ambulatory Visit (INDEPENDENT_AMBULATORY_CARE_PROVIDER_SITE_OTHER): Payer: Medicare Other | Admitting: Family Medicine

## 2022-12-24 VITALS — BP 100/60 | HR 64 | Temp 97.8°F | Ht 67.5 in | Wt 172.0 lb

## 2022-12-24 DIAGNOSIS — D649 Anemia, unspecified: Secondary | ICD-10-CM | POA: Diagnosis not present

## 2022-12-24 DIAGNOSIS — K219 Gastro-esophageal reflux disease without esophagitis: Secondary | ICD-10-CM | POA: Diagnosis not present

## 2022-12-24 DIAGNOSIS — G301 Alzheimer's disease with late onset: Secondary | ICD-10-CM | POA: Diagnosis not present

## 2022-12-24 DIAGNOSIS — F411 Generalized anxiety disorder: Secondary | ICD-10-CM | POA: Diagnosis not present

## 2022-12-24 DIAGNOSIS — I10 Essential (primary) hypertension: Secondary | ICD-10-CM

## 2022-12-24 DIAGNOSIS — K5900 Constipation, unspecified: Secondary | ICD-10-CM | POA: Diagnosis not present

## 2022-12-24 DIAGNOSIS — E119 Type 2 diabetes mellitus without complications: Secondary | ICD-10-CM | POA: Diagnosis not present

## 2022-12-24 DIAGNOSIS — N138 Other obstructive and reflux uropathy: Secondary | ICD-10-CM | POA: Diagnosis not present

## 2022-12-24 DIAGNOSIS — F02818 Dementia in other diseases classified elsewhere, unspecified severity, with other behavioral disturbance: Secondary | ICD-10-CM

## 2022-12-24 DIAGNOSIS — N401 Enlarged prostate with lower urinary tract symptoms: Secondary | ICD-10-CM

## 2022-12-24 LAB — LIPID PANEL
Cholesterol: 147 mg/dL (ref 0–200)
HDL: 47.6 mg/dL (ref >39.00)
LDL Cholesterol: 83 mg/dL (ref 0–99)
NonHDL: 98.91
Total CHOL/HDL Ratio: 3
Triglycerides: 81 mg/dL (ref 0.0–149.0)
VLDL: 16.2 mg/dL (ref 0.0–40.0)

## 2022-12-24 LAB — HEPATIC FUNCTION PANEL
ALT: 11 U/L (ref 0–53)
AST: 19 U/L (ref 0–37)
Albumin: 4 g/dL (ref 3.5–5.2)
Alkaline Phosphatase: 68 U/L (ref 39–117)
Bilirubin, Direct: 0.1 mg/dL (ref 0.0–0.3)
Total Bilirubin: 0.8 mg/dL (ref 0.2–1.2)
Total Protein: 6.4 g/dL (ref 6.0–8.3)

## 2022-12-24 LAB — CBC WITH DIFFERENTIAL/PLATELET
Basophils Absolute: 0 10*3/uL (ref 0.0–0.1)
Basophils Relative: 0.6 % (ref 0.0–3.0)
Eosinophils Absolute: 0.2 10*3/uL (ref 0.0–0.7)
Eosinophils Relative: 3.2 % (ref 0.0–5.0)
HCT: 39.5 % (ref 39.0–52.0)
Hemoglobin: 13 g/dL (ref 13.0–17.0)
Lymphocytes Relative: 30.5 % (ref 12.0–46.0)
Lymphs Abs: 1.9 10*3/uL (ref 0.7–4.0)
MCHC: 33 g/dL (ref 30.0–36.0)
MCV: 88.1 fL (ref 78.0–100.0)
Monocytes Absolute: 0.3 10*3/uL (ref 0.1–1.0)
Monocytes Relative: 5.5 % (ref 3.0–12.0)
Neutro Abs: 3.8 10*3/uL (ref 1.4–7.7)
Neutrophils Relative %: 60.2 % (ref 43.0–77.0)
Platelets: 160 10*3/uL (ref 150.0–400.0)
RBC: 4.48 Mil/uL (ref 4.22–5.81)
RDW: 14.2 % (ref 11.5–15.5)
WBC: 6.2 10*3/uL (ref 4.0–10.5)

## 2022-12-24 LAB — IBC + FERRITIN
Ferritin: 107.1 ng/mL (ref 22.0–322.0)
Iron: 81 ug/dL (ref 42–165)
Saturation Ratios: 30 % (ref 20.0–50.0)
TIBC: 270.2 ug/dL (ref 250.0–450.0)
Transferrin: 193 mg/dL — ABNORMAL LOW (ref 212.0–360.0)

## 2022-12-24 LAB — TSH: TSH: 2.65 u[IU]/mL (ref 0.35–5.50)

## 2022-12-24 LAB — BASIC METABOLIC PANEL
BUN: 15 mg/dL (ref 6–23)
CO2: 30 meq/L (ref 19–32)
Calcium: 9.5 mg/dL (ref 8.4–10.5)
Chloride: 101 meq/L (ref 96–112)
Creatinine, Ser: 1.09 mg/dL (ref 0.40–1.50)
GFR: 63.23 mL/min (ref >60.00)
Glucose, Bld: 101 mg/dL — ABNORMAL HIGH (ref 70–99)
Potassium: 4.1 meq/L (ref 3.5–5.1)
Sodium: 139 meq/L (ref 135–145)

## 2022-12-24 LAB — PSA: PSA: 1.65 ng/mL (ref 0.10–4.00)

## 2022-12-24 LAB — HEMOGLOBIN A1C: Hgb A1c MFr Bld: 6.2 % (ref 4.6–6.5)

## 2022-12-24 NOTE — Progress Notes (Signed)
Subjective:    Patient ID: Eric Richmond, male    DOB: 19-Mar-1940, 82 y.o.   MRN: [id number]  HPI Here with his wife to follow up on issues. He seems to be stable and she has no concerns. His appetite is good ans his bowels are moving well. His dementia seems to be stable, and the Risperidone at bedtime helps a lot with sundowning. She is on a wait list to get him into an adult daycare program 2 days a week. His anxiety is stable.    Review of Systems  Constitutional: Negative.   HENT: Negative.    Eyes: Negative.   Respiratory: Negative.    Cardiovascular: Negative.   Gastrointestinal: Negative.   Genitourinary: Negative.   Musculoskeletal: Negative.   Skin: Negative.   Neurological: Negative.   Psychiatric/Behavioral:  Positive for confusion.        Objective:   Physical Exam Constitutional:      General: He is not in acute distress.    Appearance: Normal appearance. He is well-developed. He is not diaphoretic.  HENT:     Head: Normocephalic and atraumatic.     Right Ear: External ear normal.     Left Ear: External ear normal.     Nose: Nose normal.     Mouth/Throat:     Pharynx: No oropharyngeal exudate.  Eyes:     General: No scleral icterus.       Right eye: No discharge.        Left eye: No discharge.     Conjunctiva/sclera: Conjunctivae normal.     Pupils: Pupils are equal, round, and reactive to light.  Neck:     Thyroid: No thyromegaly.     Vascular: No JVD.     Trachea: No tracheal deviation.  Cardiovascular:     Rate and Rhythm: Normal rate and regular rhythm.     Pulses: Normal pulses.     Heart sounds: Normal heart sounds. No murmur heard.    No friction rub. No gallop.  Pulmonary:     Effort: Pulmonary effort is normal. No respiratory distress.     Breath sounds: Normal breath sounds. No wheezing or rales.  Chest:     Chest wall: No tenderness.  Abdominal:     General: Bowel sounds are normal. There is no distension.     Palpations: Abdomen  is soft. There is no mass.     Tenderness: There is no abdominal tenderness. There is no guarding or rebound.  Genitourinary:    Penis: No tenderness.   Musculoskeletal:        General: No tenderness. Normal range of motion.     Cervical back: Neck supple.  Lymphadenopathy:     Cervical: No cervical adenopathy.  Skin:    General: Skin is warm and dry.     Coloration: Skin is not pale.     Findings: No erythema or rash.  Neurological:     General: No focal deficit present.     Mental Status: He is alert and oriented to person, place, and time.     Cranial Nerves: No cranial nerve deficit.     Motor: No abnormal muscle tone.     Coordination: Coordination normal.     Deep Tendon Reflexes: Reflexes are normal and symmetric. Reflexes normal.  Psychiatric:        Mood and Affect: Mood normal.        Behavior: Behavior normal.        Thought Content:  Thought content normal.        Judgment: Judgment normal.           Assessment & Plan:  His dementia seems to be stable. We will maintain the current medications. His anxiety and sundowning are well controlled. His constipation is stable. Get fasting labs to check an A1c, lipids etc. We spent a total of ( 33  ) minutes reviewing records and discussing these issues.  Gershon Crane, MD

## 2023-01-09 ENCOUNTER — Other Ambulatory Visit: Payer: Self-pay | Admitting: Family Medicine

## 2023-01-11 ENCOUNTER — Other Ambulatory Visit: Payer: Self-pay | Admitting: Family Medicine

## 2023-01-14 ENCOUNTER — Telehealth: Payer: Self-pay | Admitting: Family Medicine

## 2023-01-14 NOTE — Telephone Encounter (Signed)
Pt wife is calling and did not result the blood work result from 12-24-2022

## 2023-01-14 NOTE — Telephone Encounter (Signed)
Reviewed lab results with pt spouse, verbalized understanding

## 2023-01-19 ENCOUNTER — Other Ambulatory Visit: Payer: Self-pay | Admitting: Family Medicine

## 2023-01-31 ENCOUNTER — Encounter: Payer: Self-pay | Admitting: Pharmacist

## 2023-01-31 NOTE — Progress Notes (Signed)
Pharmacy Quality Measure Review  This patient is appearing on a report for being at risk of failing the adherence measure for hypertension (ACEi/ARB) medications this calendar year.   Medication: losartan/hydrochlorothiazide 50/12.5 mg Last fill date: 11/14 for 90 day supply  Insurance report was not up to date. No action needed at this time.   Catie Eppie Gibson, PharmD, BCACP, CPP Clinical Pharmacist [facility] [phone]

## 2023-02-06 ENCOUNTER — Other Ambulatory Visit: Payer: Self-pay | Admitting: Family Medicine

## 2023-02-08 ENCOUNTER — Telehealth: Payer: Self-pay | Admitting: Neurology

## 2023-02-08 NOTE — Telephone Encounter (Signed)
Caller left message to speak with [facility]

## 2023-02-12 NOTE — Telephone Encounter (Signed)
Tried to call no answer, no voicemail at 8:23 02/12/2023

## 2023-02-15 NOTE — Telephone Encounter (Signed)
Tried multiple attempts to reach patient, unable to reach x 3. No wear to leave a voicemail.

## 2023-03-03 ENCOUNTER — Ambulatory Visit
Admission: EM | Admit: 2023-03-03 | Discharge: 2023-03-03 | Disposition: A | Discharge status: Home / Self Care | Payer: Medicare Other | Attending: Family Medicine | Admitting: Family Medicine

## 2023-03-03 DIAGNOSIS — R197 Diarrhea, unspecified: Secondary | ICD-10-CM

## 2023-03-03 DIAGNOSIS — K529 Noninfective gastroenteritis and colitis, unspecified: Secondary | ICD-10-CM | POA: Diagnosis not present

## 2023-03-03 DIAGNOSIS — K644 Residual hemorrhoidal skin tags: Secondary | ICD-10-CM | POA: Diagnosis not present

## 2023-03-03 MED ORDER — HYDROCORTISONE (PERIANAL) 2.5 % EX CREA: 1.0000 | TOPICAL_CREAM | Freq: Two times a day (BID) | CUTANEOUS | 0 refills | Status: DC

## 2023-03-03 MED ORDER — LOPERAMIDE HCL 2 MG PO CAPS: 2.0000 mg | ORAL_CAPSULE | Freq: Two times a day (BID) | ORAL | 0 refills | Status: DC | PRN

## 2023-03-03 NOTE — Discharge Instructions (Signed)
 Make sure you push fluids drinking mostly water but mix it with Gatorade.  Try to eat light meals including soups, broths and soft foods, fruits.  Imodium  can help with diarrhea but use this carefully limiting it to 1-2 times per day only if you are having a lot of diarrhea.  Please return to the clinic if symptoms worsen or you start having severe abdominal pain not helped by taking Tylenol  or start having bloody stools or blood in the vomit. Use hydrocortisone  cream for the external hemorrhoid.

## 2023-03-03 NOTE — ED Provider Notes (Signed)
 [facility]  Note:  This document was prepared using Conservation officer, historic buildings and may include unintentional dictation errors.  MRN: [id number] DOB: [birth year]  Subjective:   Eric Richmond is a 83 y.o. male presenting for 3-day history of persistent diarrhea, incontinence.  Has started to have some perianal pain when he defecates.  Has a history of an anal fissure that had to be repaired surgically. No fever, bloody stools, recent antibiotic use, hospitalizations or long distance travel.  Has not eaten raw foods, drank unfiltered water.  No history of GI disorders including Crohn's, IBS, ulcerative colitis.   No current facility-administered medications for this encounter.  Current Outpatient Medications:    albuterol  (PROAIR  HFA) 108 (90 Base) MCG/ACT inhaler, Inhale 2 puffs into the lungs every 4 (four) hours as needed for wheezing or shortness of breath., Disp: 18 g, Rfl: 5   aspirin  EC 81 MG tablet, Take 81 mg by mouth daily., Disp: , Rfl:    diazepam  (VALIUM ) 2 MG tablet, TAKE 1 TABLET BY MOUTH EVERY 8 HOURS AS NEEDED FOR DIZZINESS, Disp: 90 tablet, Rfl: 5   docusate sodium  (COLACE) 100 MG capsule, Take 1 capsule (100 mg total) by mouth 2 (two) times daily., Disp: 60 capsule, Rfl: 11   escitalopram  (LEXAPRO ) 20 MG tablet, Take 1 tablet by mouth once daily, Disp: 90 tablet, Rfl: 0   famotidine  (PEPCID ) 40 MG tablet, TAKE 1 TABLET BY MOUTH ONCE DAILY BEFORE SUPPER, Disp: 30 tablet, Rfl: 0   ferrous gluconate (FERGON) 324 MG tablet, Take 324 mg by mouth daily., Disp: , Rfl:    fluticasone  (FLONASE ) 50 MCG/ACT nasal spray, Place 2 sprays into both nostrils daily., Disp: 48 g, Rfl: 3   losartan -hydrochlorothiazide  (HYZAAR) 50-12.5 MG tablet, TAKE 1 TABLET BY MOUTH ONCE DAILY IN THE EVENING, Disp: 90 tablet, Rfl: 0   magnesium hydroxide (MILK OF MAGNESIA) 400 MG/5ML suspension, Take by mouth 3 (three) times daily as needed for mild constipation., Disp: , Rfl:     memantine  (NAMENDA ) 10 MG tablet, Take 1 tablet by mouth twice daily, Disp: 180 tablet, Rfl: 0   Multiple Vitamin (MULTIVITAMIN WITH MINERALS) TABS tablet, Take 1 tablet by mouth daily., Disp: , Rfl:    pantoprazole  (PROTONIX ) 40 MG tablet, Take 1 tablet by mouth once daily, Disp: 90 tablet, Rfl: 0   polyethylene glycol (MIRALAX  / GLYCOLAX ) 17 g packet, Take 17 g by mouth daily as needed., Disp: , Rfl:    risperiDONE  (RISPERDAL ) 0.5 MG tablet, Take 1 tablet (0.5 mg total) by mouth at bedtime., Disp: 90 tablet, Rfl: 1   Allergies  Allergen Reactions   Oxycodone  Other (See Comments)    Hallucinations    Tramadol  Other (See Comments)    Hallucinations     Past Medical History:  Diagnosis Date   Allergy    Anal fissure 20 years ago   COVID-19 01/2019   Diabetes mellitus without complication (HCC) 03/18/2013   ED (erectile dysfunction)    Essential hypertension 10/22/2006   Qualifier: Diagnosis of  By: Zakkary MD, Garnette LABOR    GERD (gastroesophageal reflux disease)    Hard of hearing    Mild neurocognitive disorder 01/06/2019   OSA (obstructive sleep apnea)    per Dr. Corrie , cpap -2.0   Osteoarthritis    Right knee and right thumb; s/p total knee replacement   Pneumonia 2015     Past Surgical History:  Procedure Laterality Date   ANAL FISSURE REPAIR  colonoscopy  04/10/2019   per Dr. Albertus, adenomatous polyps removed, no repeats due to age   KNEE ARTHROSCOPY     left knee   KNEE CLOSED REDUCTION Left 01/13/2014   Procedure: CLOSED MANIPULATION LEFT KNEE;  Surgeon: Dempsey Melodi GAILS, MD;  Location: [facility];  Service: Orthopedics;  Laterality: Left;   SURGERY AGE 71 OR 7 ON LEFT KNEE FOR A GLAND BEHIND THE KNEE     TOTAL KNEE ARTHROPLASTY Left 10/26/2013   Procedure: LEFT TOTAL KNEE ARTHROPLASTY;  Surgeon: Dempsey Melodi GAILS, MD;  Location: [facility];  Service: Orthopedics;  Laterality: Left;    Family History  Problem Relation Age of Onset   Breast cancer Sister    Diabetes  Sister    Hypertension Sister    Thyroid  cancer Sister    Diabetes Mother    Hypertension Mother    Diabetes Father    Hypertension Father    Diabetes Sister    Hypertension Other    Diabetes Brother    Hypertension Brother    Heart failure Brother    Heart failure Brother    Diabetes Sister    Colon cancer Neg Hx    Esophageal cancer Neg Hx    Rectal cancer Neg Hx    Stomach cancer Neg Hx     Social History   Tobacco Use   Smoking status: Former    Current packs/day: 0.00    Average packs/day: 0.3 packs/day for 20.0 years (6.0 ttl pk-yrs)    Types: Cigarettes    Start date: 02/26/1957    Quit date: 02/26/1977    Years since quitting: 46.0   Smokeless tobacco: Former    Types: Associate Professor status: Never Used  Substance Use Topics   Alcohol use: No    Alcohol/week: 0.0 standard drinks of alcohol   Drug use: No    ROS   Objective:   Vitals: BP (!) 155/92 (BP Location: Left Arm)   Pulse 74   Temp 98.9 F (37.2 C)   Resp 16   SpO2 94%   Physical Exam Constitutional:      General: He is not in acute distress.    Appearance: Normal appearance. He is well-developed and normal weight. He is not ill-appearing, toxic-appearing or diaphoretic.  HENT:     Head: Normocephalic and atraumatic.     Right Ear: External ear normal.     Left Ear: External ear normal.     Nose: Nose normal.     Mouth/Throat:     Pharynx: Oropharynx is clear.  Eyes:     General: No scleral icterus.       Right eye: No discharge.        Left eye: No discharge.     Extraocular Movements: Extraocular movements intact.  Cardiovascular:     Rate and Rhythm: Normal rate.  Pulmonary:     Effort: Pulmonary effort is normal.  Abdominal:     General: Bowel sounds are normal. There is no distension.     Palpations: Abdomen is soft. There is no mass.     Tenderness: There is no abdominal tenderness. There is no right CVA tenderness, left CVA tenderness, guarding or rebound.   Genitourinary:    Rectum: Tenderness and external hemorrhoid (~1cm non-thrombosed) present. No anal fissure.    Musculoskeletal:     Cervical back: Normal range of motion.  Neurological:     Mental Status: He is alert and oriented to person, place,  and time.  Psychiatric:        Mood and Affect: Mood normal.        Behavior: Behavior normal.        Thought Content: Thought content normal.        Judgment: Judgment normal.     Assessment and Plan :   PDMP not reviewed this encounter.  1. Colitis   2. Diarrhea, unspecified type   3. External hemorrhoid    Will manage for colitis with supportive care.  Recommended hydrocortisone  rectal cream for the external hemorrhoid.  Does not appear thrombosed.  No signs of anal fissure, bloody stool.  Push fluids, use loperamide  for supportive care as he recovers from colitis.  Follow-up with [facility] regarding the hemorrhoid or any possible recurrent anal fissure.  Counseled patient on potential for adverse effects with medications prescribed/recommended today, ER and return-to-clinic precautions discussed, patient verbalized understanding.    Christopher Savannah, PA-C 03/03/23 [id number]

## 2023-03-03 NOTE — ED Triage Notes (Addendum)
 Pt presents with c/o rectal pain and diarrhea x 3 days. Pt wife states he has been hurting during BM's, has a sur odor in stool. Has c/o stomach pain

## 2023-03-04 ENCOUNTER — Ambulatory Visit: Payer: Self-pay | Admitting: Family Medicine

## 2023-03-04 NOTE — Telephone Encounter (Signed)
 Copied from CRM [id number]. Topic: Clinical - Pink Word Triage >> Mar 04, 2023  1:14 PM Suzette B wrote: Reason for Triage: patient went to urgent care yesterday and requested the patient see his doctor as soon as possible patient diarrhea X's 5 days straight.  Chief Complaint: diarrhea and rectal pain Symptoms: diarrhea and rectal pain Frequency: x 5 days Pertinent Negatives: Patient denies blood stool  Disposition: [] ED /[] Urgent Care (no appt availability in office) / [x] Appointment(In office/virtual)/ []  [facility]/ [] Home Care/ [] Refused Recommended Disposition /[] [facility]/ []  Follow-up with PCP Additional Notes: pt's wife called stated she went to urgent care yesterday and today pt is no better: would like earlier appt.  New appointment made for 03/05/2023. Home care instructions given.  Reason for Disposition  [1] MODERATE diarrhea (e.g., 4-6 times / day more than normal) AND [2] age > 70 years  Answer Assessment - Initial Assessment Questions 1. DIARRHEA SEVERITY: How bad is the diarrhea? How many more stools have you had in the past 24 hours than normal?    - NO DIARRHEA (SCALE 0)   - MILD (SCALE 1-3): Few loose or mushy BMs; increase of 1-3 stools over normal daily number of stools; mild increase in ostomy output.   -  MODERATE (SCALE 4-7): Increase of 4-6 stools daily over normal; moderate increase in ostomy output.   -  SEVERE (SCALE 8-10; OR WORST POSSIBLE): Increase of 7 or more stools daily over normal; moderate increase in ostomy output; incontinence.     mild 2. ONSET: When did the diarrhea begin?      X 5 days 3. BM CONSISTENCY: How loose or watery is the diarrhea?      Watery mixture 4. VOMITING: Are you also vomiting? If Yes, ask: How many times in the past 24 hours?      no 5. ABDOMEN PAIN: Are you having any abdomen pain? If Yes, ask: What does it feel like? (e.g., crampy, dull, intermittent, constant)  no     6. ABDOMEN PAIN  SEVERITY: If present, ask: How bad is the pain?  (e.g., Scale 1-10; mild, moderate, or severe)   - MILD (1-3): doesn't interfere with normal activities, abdomen soft and not tender to touch    - MODERATE (4-7): interferes with normal activities or awakens from sleep, abdomen tender to touch    - SEVERE (8-10): excruciating pain, doubled over, unable to do any normal activities       Rectal pain - not able sit flat on buttocks due to pain  7. ORAL INTAKE: If vomiting, Have you been able to drink liquids? How much liquids have you had in the past 24 hours?     Eating and drinking: but comes out in diarrhea and messes up clothes 8. HYDRATION: Any signs of dehydration? (e.g., dry mouth [not just dry lips], too weak to stand, dizziness, new weight loss) When did you last urinate?     Seems like pt is weak 9. EXPOSURE: Have you traveled to a foreign country recently? Have you been exposed to anyone with diarrhea? Could you have eaten any food that was spoiled?     no 10. AnTIBIOTIC USE: Are you taking antibiotics now or have you taken antibiotics in the past 2 months?       no 11. OTHER SYMPTOMS: Do you have any other symptoms? (e.g., fever, blood in stool)       no 12. PREGNANCY: Is there any chance you are pregnant?  When was your last menstrual period?       No  Protocols used: Diarrhea-A-AH

## 2023-03-04 NOTE — Telephone Encounter (Signed)
 FYI Pt was seen at the ED on 03/03/23. Has a f/u with Dr Caryl Never on 03/04/13.

## 2023-03-05 ENCOUNTER — Ambulatory Visit (INDEPENDENT_AMBULATORY_CARE_PROVIDER_SITE_OTHER): Payer: Medicare Other | Admitting: Family Medicine

## 2023-03-05 ENCOUNTER — Encounter: Payer: Self-pay | Admitting: Family Medicine

## 2023-03-05 VITALS — BP 120/70 | HR 73 | Temp 97.7°F | Wt 174.3 lb

## 2023-03-05 DIAGNOSIS — R197 Diarrhea, unspecified: Secondary | ICD-10-CM

## 2023-03-05 DIAGNOSIS — K6289 Other specified diseases of anus and rectum: Secondary | ICD-10-CM | POA: Diagnosis not present

## 2023-03-05 NOTE — Progress Notes (Signed)
 Established Patient Office Visit  Subjective   Patient ID: Eric Richmond, male    DOB: 1940-08-06  Age: 83 y.o. MRN: [id number]  Chief Complaint  Patient presents with   Diarrhea    Patient complains of diarrhea, x5 days     HPI   Eric Richmond is here today accompanied by wife with about 5-day history of frequent loose stools.  He has advanced dementia and very limited history obtainable from patient.  He was actually seen in urgent care couple days ago and prescribed Imodium  twice daily.  Has not helped much thus far.  Patient does have history of reported anal fissure.  He has complained of some pain recently with defecation.  Exam in the urgent care revealed about 1 cm nonthrombosed hemorrhoid externally but no evidence for fissure.  He was also prescribed hydrocortisone  cream but wife states this has not been very effective with his frequent soiling.  No recent antibiotics.  No recent change in medication.  No fever.  No nausea or vomiting.  She has been trying to follow a bland diet.  He has no history of Crohn's disease or ulcerative colitis.  He does have MiraLAX  and Colace on med list but wife has held these for the past several days.  Wife has not noted any blood in the stools.  Past Medical History:  Diagnosis Date   Allergy    Anal fissure 20 years ago   COVID-19 01/2019   Diabetes mellitus without complication (HCC) 03/18/2013   ED (erectile dysfunction)    Essential hypertension 10/22/2006   Qualifier: Diagnosis of  By: Bon MD, Garnette LABOR    GERD (gastroesophageal reflux disease)    Hard of hearing    Mild neurocognitive disorder 01/06/2019   OSA (obstructive sleep apnea)    per Dr. Corrie , cpap -2.0   Osteoarthritis    Right knee and right thumb; s/p total knee replacement   Pneumonia 2015   Past Surgical History:  Procedure Laterality Date   ANAL FISSURE REPAIR     colonoscopy  04/10/2019   per Dr. Albertus, adenomatous polyps removed, no repeats due to age    KNEE ARTHROSCOPY     left knee   KNEE CLOSED REDUCTION Left 01/13/2014   Procedure: CLOSED MANIPULATION LEFT KNEE;  Surgeon: Dempsey Melodi GAILS, MD;  Location: [facility];  Service: Orthopedics;  Laterality: Left;   SURGERY AGE 83 OR 7 ON LEFT KNEE FOR A GLAND BEHIND THE KNEE     TOTAL KNEE ARTHROPLASTY Left 10/26/2013   Procedure: LEFT TOTAL KNEE ARTHROPLASTY;  Surgeon: Dempsey Melodi GAILS, MD;  Location: [facility];  Service: Orthopedics;  Laterality: Left;    reports that he quit smoking about 46 years ago. His smoking use included cigarettes. He started smoking about 66 years ago. He has a 6 pack-year smoking history. He has quit using smokeless tobacco.  His smokeless tobacco use included chew. He reports that he does not drink alcohol and does not use drugs. family history includes Breast cancer in his sister; Diabetes in his brother, father, mother, sister, sister, and sister; Heart failure in his brother and brother; Hypertension in his brother, father, mother, sister, and another family member; Thyroid  cancer in his sister. Allergies  Allergen Reactions   Oxycodone  Other (See Comments)    Hallucinations    Tramadol  Other (See Comments)    Hallucinations     Review of Systems  Constitutional:  Negative for chills and fever.  Gastrointestinal:  Positive  for diarrhea. Negative for abdominal pain, blood in stool, melena, nausea and vomiting.  Genitourinary:  Negative for dysuria.      Objective:     BP 120/70 (BP Location: Left Arm, Cuff Size: Normal)   Pulse 73   Temp 97.7 F (36.5 C) (Oral)   Wt 174 lb 4.8 oz (79.1 kg)   SpO2 97%   BMI 26.90 kg/m  BP Readings from Last 3 Encounters:  03/05/23 120/70  03/03/23 (!) 155/92  12/24/22 100/60   Wt Readings from Last 3 Encounters:  03/05/23 174 lb 4.8 oz (79.1 kg)  12/24/22 172 lb (78 kg)  12/14/22 172 lb 12.8 oz (78.4 kg)      Physical Exam Vitals reviewed.  Constitutional:      General: He is not in acute distress.     Appearance: He is not ill-appearing.  Cardiovascular:     Rate and Rhythm: Normal rate.  Pulmonary:     Effort: Pulmonary effort is normal.     Breath sounds: Normal breath sounds.  Abdominal:     Palpations: Abdomen is soft.     Tenderness: There is no abdominal tenderness.  Genitourinary:    Comments: Anal exam was attempted but he has significant fecal soilage which makes exam challenging. Neurological:     Mental Status: He is alert.      No results found for any visits on 03/05/23.  Last CBC Lab Results  Component Value Date   WBC 6.2 12/24/2022   HGB 13.0 12/24/2022   HCT 39.5 12/24/2022   MCV 88.1 12/24/2022   MCH 28.8 04/01/2022   RDW 14.2 12/24/2022   PLT 160.0 12/24/2022   Last metabolic panel Lab Results  Component Value Date   GLUCOSE 101 (H) 12/24/2022   NA 139 12/24/2022   K 4.1 12/24/2022   CL 101 12/24/2022   CO2 30 12/24/2022   BUN 15 12/24/2022   CREATININE 1.09 12/24/2022   GFR 63.23 12/24/2022   CALCIUM 9.5 12/24/2022   PROT 6.4 12/24/2022   ALBUMIN 4.0 12/24/2022   BILITOT 0.8 12/24/2022   ALKPHOS 68 12/24/2022   AST 19 12/24/2022   ALT 11 12/24/2022   ANIONGAP 9 04/01/2022   Last thyroid  functions Lab Results  Component Value Date   TSH 2.65 12/24/2022      The ASCVD Risk score (Arnett DK, et al., 2019) failed to calculate for the following reasons:   The 2019 ASCVD risk score is only valid for ages 76 to 35    Assessment & Plan:   83 year old with advanced dementia with 5-day history of frequent loose stools.  Etiology unclear.  He does not appear dehydrated.  No recent antibiotics.  No recent travels.  No fever.  No recent change of medication.  -Continue to hold MiraLAX  and stool softener -Continue Imodium  twice daily as needed -Try to keep anal area as clean as possible and consider application of zinc  oxide or Desitin as a moisture repellent -Check basic metabolic panel to rule out electrolyte disturbance with 5-day  history of diarrhea -Discussed appropriate diet with handout given -Make sure he is staying well-hydrated -Touch base if diarrhea not improving by next week.   No follow-ups on file.    Wolm Scarlet, MD

## 2023-03-06 ENCOUNTER — Ambulatory Visit: Payer: Medicare Other | Admitting: Family Medicine

## 2023-03-06 LAB — BASIC METABOLIC PANEL
BUN: 14 mg/dL (ref 6–23)
CO2: 27 meq/L (ref 19–32)
Calcium: 9 mg/dL (ref 8.4–10.5)
Chloride: 99 meq/L (ref 96–112)
Creatinine, Ser: 1.11 mg/dL (ref 0.40–1.50)
GFR: 61.78 mL/min (ref >60.00)
Glucose, Bld: 120 mg/dL — ABNORMAL HIGH (ref 70–99)
Potassium: 3.9 meq/L (ref 3.5–5.1)
Sodium: 133 meq/L — ABNORMAL LOW (ref 135–145)

## 2023-03-11 ENCOUNTER — Ambulatory Visit: Payer: Self-pay | Admitting: Family Medicine

## 2023-03-11 ENCOUNTER — Telehealth: Payer: Self-pay | Admitting: Family Medicine

## 2023-03-11 ENCOUNTER — Telehealth: Payer: Self-pay

## 2023-03-11 NOTE — Telephone Encounter (Signed)
  Chief Complaint: Constipation Symptoms: Constipation, abd pain Frequency: Ongoing for 3 days Pertinent Negatives: Patient denies N/V, fever Disposition: [] ED /[] Urgent Care (no appt availability in office) / [x] Appointment(In office/virtual)/ []  [facility]/ [] Home Care/ [] Refused Recommended Disposition /[] [facility]/ []  Follow-up with PCP Additional Notes: Pt's spouse reports pt has had diarrhea for about 3 days and today he has not been able to have a BM and seems uncomfortable and constipated. Pt's spouse denies N/V, blood in stool, fever. She notes she has been increasing fluids and he went out for a while today to walk. Pt already has an appt scheduled tomorrow. Pt's spouse will keep appt. This RN educated spouse on home care, new-worsening symptoms, when to call back/seek emergent care. Pt's spouse verbalized understanding and agrees to plan.   Copied from CRM [id number]. Topic: Clinical - Pink Word Triage >> Mar 11, 2023  5:56 PM Suzette B wrote: Reason for Triage: Patient's spouse called in regards to the patient having diarrhea the past few days to now being constipated.  I advised her the patient does have an appointment tomorrow, she stated the patient is in pain from constipation now, and ask what can she give him to help him before the appointment tomorrow Reason for Disposition  Abdomen is more swollen than usual  Answer Assessment - Initial Assessment Questions 1. STOOL PATTERN OR FREQUENCY: How often do you have a bowel movement (BM)?  (Normal range: 3 times a day to every 3 days)  When was your last BM?       Diarrhea for the last 3 days, now feeling constipated. Usually goes daily 2. STRAINING: Do you have to strain to have a BM?      Yes 3. RECTAL PAIN: Does your rectum hurt when the stool comes out? If Yes, ask: Do you have hemorrhoids? How bad is the pain?  (Scale 1-10; or mild, moderate, severe)     Yes 4. STOOL COMPOSITION: Are the  stools hard?      Unable to pass stool now 5. BLOOD ON STOOLS: Has there been any blood on the toilet tissue or on the surface of the BM? If Yes, ask: When was the last time?     Non 6. CHRONIC CONSTIPATION: Is this a new problem for you?  If No, ask: How long have you had this problem? (days, weeks, months)      Yes 7. CHANGES IN DIET OR HYDRATION: Have there been any recent changes in your diet? How much fluids are you drinking on a daily basis?  How much have you had to drink today?     No 8. MEDICINES: Have you been taking any new medicines? Are you taking any narcotic pain medicines? (e.g., Dilaudid , morphine , Percocet, Vicodin)     No 11. CAUSE: What do you think is causing the constipation?        Unknown 12. OTHER SYMPTOMS: Do you have any other symptoms? (e.g., abdomen pain, bloating, fever, vomiting)       Bloating, gas, reflux  Protocols used: Constipation-A-AH

## 2023-03-11 NOTE — Telephone Encounter (Signed)
 Copied from CRM [id number]. Topic: Referral - Request for Referral >> Mar 11, 2023 10:39 AM Franky GRADE wrote: Did the patient discuss referral with their provider in the last year? Yes (If No - schedule appointment) (If Yes - send message)  Appointment offered? No  Type of order/referral and detailed reason for visit: Patient's wife is calling to request Home health aide due to dementia. She is having trouble taking care of the patient all by herself.   Preference of office, provider, location: Home health  If referral order, have you been seen by this specialty before? No (If Yes, this issue or another issue? When? Where?  Can we respond through MyChart? No

## 2023-03-11 NOTE — Telephone Encounter (Signed)
 Pt has appointment with Dr Clent Ridges on 03/12/23

## 2023-03-11 NOTE — Telephone Encounter (Signed)
 Reason for CRM: Sherri called in regarding a hospice referral received for patient- patients family is requesting Dr. Gael to serve as attending. Sherri would like to confirm if the patient has 6 months or less, as well as if Dr.Fry will serve as attending    Please call Sherri back at [phone] to discuss further

## 2023-03-12 ENCOUNTER — Ambulatory Visit: Payer: Medicare Other | Admitting: Family Medicine

## 2023-03-12 NOTE — Telephone Encounter (Signed)
 FYI Spoke with pt spouse state that pt is not feeling well off and on diarrhea, constipation and fever. Pt wife wanted to let Dr Anna know that they have contacted Hospice and they will be going to evaluate pt tomorrow 03/13/23. Advise pt spouse to keep us  updated on pt care.

## 2023-03-13 NOTE — Telephone Encounter (Signed)
 I do not think he qualifies for hospice care at this time, but he would be a good candidate for palliative care if the family agrees

## 2023-03-18 NOTE — Telephone Encounter (Signed)
Spoke with pt /spouse verbalized understanding of Dr Clent Ridges advise

## 2023-04-23 ENCOUNTER — Telehealth: Payer: Self-pay

## 2023-04-23 NOTE — Telephone Encounter (Signed)
 Copied from CRM [id number]. Topic: General - Other >> Apr 23, 2023  3:13 PM Turkey A wrote: Reason for CRM:  Patient is at [facility] for 5 day Respite

## 2023-04-26 ENCOUNTER — Ambulatory Visit: Payer: Medicare Other | Admitting: Physician Assistant

## 2023-05-28 DEATH — deceased
